# Patient Record
Sex: Male | Born: 1945 | Race: White | Hispanic: No | Smoking: Former smoker
Health system: Southern US, Community
[De-identification: ages and names within clinical notes are randomized; demographics above are authoritative.]

## PROBLEM LIST (undated history)

## (undated) DIAGNOSIS — I10 Essential (primary) hypertension: Secondary | ICD-10-CM

## (undated) DIAGNOSIS — H269 Unspecified cataract: Secondary | ICD-10-CM

## (undated) DIAGNOSIS — D369 Benign neoplasm, unspecified site: Secondary | ICD-10-CM

## (undated) DIAGNOSIS — E785 Hyperlipidemia, unspecified: Secondary | ICD-10-CM

## (undated) DIAGNOSIS — N390 Urinary tract infection, site not specified: Secondary | ICD-10-CM

## (undated) DIAGNOSIS — I219 Acute myocardial infarction, unspecified: Secondary | ICD-10-CM

## (undated) DIAGNOSIS — D689 Coagulation defect, unspecified: Secondary | ICD-10-CM

## (undated) DIAGNOSIS — M1990 Unspecified osteoarthritis, unspecified site: Secondary | ICD-10-CM

## (undated) DIAGNOSIS — I2510 Atherosclerotic heart disease of native coronary artery without angina pectoris: Secondary | ICD-10-CM

## (undated) DIAGNOSIS — K5130 Ulcerative (chronic) rectosigmoiditis without complications: Secondary | ICD-10-CM

## (undated) DIAGNOSIS — R001 Bradycardia, unspecified: Secondary | ICD-10-CM

## (undated) DIAGNOSIS — K219 Gastro-esophageal reflux disease without esophagitis: Secondary | ICD-10-CM

## (undated) DIAGNOSIS — B029 Zoster without complications: Secondary | ICD-10-CM

## (undated) DIAGNOSIS — I639 Cerebral infarction, unspecified: Secondary | ICD-10-CM

## (undated) DIAGNOSIS — G4730 Sleep apnea, unspecified: Secondary | ICD-10-CM

## (undated) DIAGNOSIS — M6530 Trigger finger, unspecified finger: Secondary | ICD-10-CM

## (undated) HISTORY — DX: Gastro-esophageal reflux disease without esophagitis: K21.9

## (undated) HISTORY — DX: Coagulation defect, unspecified: D68.9

## (undated) HISTORY — DX: Atherosclerotic heart disease of native coronary artery without angina pectoris: I25.10

## (undated) HISTORY — DX: Essential (primary) hypertension: I10

## (undated) HISTORY — DX: Cerebral infarction, unspecified: I63.9

## (undated) HISTORY — DX: Sleep apnea, unspecified: G47.30

## (undated) HISTORY — DX: Trigger finger, unspecified finger: M65.30

## (undated) HISTORY — DX: Bradycardia, unspecified: R00.1

## (undated) HISTORY — PX: TRIGGER FINGER RELEASE: SHX641

## (undated) HISTORY — DX: Benign neoplasm, unspecified site: D36.9

## (undated) HISTORY — DX: Hyperlipidemia, unspecified: E78.5

## (undated) HISTORY — DX: Zoster without complications: B02.9

## (undated) HISTORY — DX: Unspecified osteoarthritis, unspecified site: M19.90

## (undated) HISTORY — DX: Acute myocardial infarction, unspecified: I21.9

## (undated) HISTORY — DX: Unspecified cataract: H26.9

## (undated) HISTORY — DX: Ulcerative (chronic) rectosigmoiditis without complications: K51.30

## (undated) HISTORY — DX: Urinary tract infection, site not specified: N39.0

## (undated) HISTORY — PX: OTHER SURGICAL HISTORY: SHX169

---

## 1981-11-14 HISTORY — PX: LUMBAR FUSION: SHX111

## 1991-11-15 DIAGNOSIS — I219 Acute myocardial infarction, unspecified: Secondary | ICD-10-CM

## 1991-11-15 HISTORY — PX: ANGIOPLASTY: SHX39

## 1991-11-15 HISTORY — DX: Acute myocardial infarction, unspecified: I21.9

## 1992-08-27 ENCOUNTER — Encounter: Payer: Self-pay | Admitting: Gastroenterology

## 1997-08-14 ENCOUNTER — Encounter: Payer: Self-pay | Admitting: Gastroenterology

## 1997-08-18 ENCOUNTER — Encounter: Payer: Self-pay | Admitting: Gastroenterology

## 2000-11-14 HISTORY — PX: CHOLECYSTECTOMY: SHX55

## 2001-04-06 ENCOUNTER — Encounter: Payer: Self-pay | Admitting: Cardiovascular Disease

## 2001-04-06 ENCOUNTER — Encounter: Payer: Self-pay | Admitting: Internal Medicine

## 2001-04-06 ENCOUNTER — Inpatient Hospital Stay (HOSPITAL_COMMUNITY): Admission: EM | Admit: 2001-04-06 | Discharge: 2001-04-07 | Payer: Self-pay | Admitting: Cardiovascular Disease

## 2001-04-11 ENCOUNTER — Encounter (INDEPENDENT_AMBULATORY_CARE_PROVIDER_SITE_OTHER): Payer: Self-pay | Admitting: *Deleted

## 2001-04-11 ENCOUNTER — Ambulatory Visit (HOSPITAL_COMMUNITY): Admission: RE | Admit: 2001-04-11 | Discharge: 2001-04-12 | Payer: Self-pay | Admitting: *Deleted

## 2004-03-17 ENCOUNTER — Encounter: Payer: Self-pay | Admitting: Gastroenterology

## 2004-10-25 ENCOUNTER — Ambulatory Visit: Payer: Self-pay | Admitting: Internal Medicine

## 2004-11-01 ENCOUNTER — Ambulatory Visit: Payer: Self-pay | Admitting: Internal Medicine

## 2004-12-07 ENCOUNTER — Ambulatory Visit: Payer: Self-pay | Admitting: Internal Medicine

## 2004-12-21 ENCOUNTER — Ambulatory Visit: Payer: Self-pay | Admitting: Gastroenterology

## 2005-02-16 ENCOUNTER — Ambulatory Visit: Payer: Self-pay | Admitting: Internal Medicine

## 2005-03-08 ENCOUNTER — Ambulatory Visit: Payer: Self-pay | Admitting: Internal Medicine

## 2005-04-25 ENCOUNTER — Ambulatory Visit: Payer: Self-pay | Admitting: Gastroenterology

## 2005-05-13 ENCOUNTER — Ambulatory Visit: Payer: Self-pay | Admitting: Internal Medicine

## 2005-06-13 ENCOUNTER — Ambulatory Visit: Payer: Self-pay | Admitting: Gastroenterology

## 2005-07-20 ENCOUNTER — Ambulatory Visit: Payer: Self-pay | Admitting: Gastroenterology

## 2005-07-26 ENCOUNTER — Ambulatory Visit: Payer: Self-pay | Admitting: Internal Medicine

## 2005-08-24 ENCOUNTER — Ambulatory Visit: Payer: Self-pay | Admitting: Internal Medicine

## 2005-08-29 ENCOUNTER — Ambulatory Visit: Payer: Self-pay | Admitting: Gastroenterology

## 2005-10-07 ENCOUNTER — Ambulatory Visit: Payer: Self-pay | Admitting: Internal Medicine

## 2005-10-21 ENCOUNTER — Ambulatory Visit: Payer: Self-pay | Admitting: Internal Medicine

## 2005-10-24 ENCOUNTER — Ambulatory Visit: Payer: Self-pay | Admitting: Gastroenterology

## 2005-10-28 ENCOUNTER — Ambulatory Visit: Payer: Self-pay | Admitting: Internal Medicine

## 2005-11-28 ENCOUNTER — Ambulatory Visit: Payer: Self-pay | Admitting: Internal Medicine

## 2005-12-29 ENCOUNTER — Ambulatory Visit: Payer: Self-pay | Admitting: Internal Medicine

## 2006-01-07 ENCOUNTER — Emergency Department (HOSPITAL_COMMUNITY): Admission: EM | Admit: 2006-01-07 | Discharge: 2006-01-07 | Payer: Self-pay | Admitting: Family Medicine

## 2006-03-01 ENCOUNTER — Ambulatory Visit: Payer: Self-pay | Admitting: Internal Medicine

## 2006-03-15 ENCOUNTER — Ambulatory Visit: Payer: Self-pay | Admitting: Internal Medicine

## 2006-04-17 ENCOUNTER — Ambulatory Visit: Payer: Self-pay | Admitting: Gastroenterology

## 2006-06-09 ENCOUNTER — Ambulatory Visit: Payer: Self-pay | Admitting: Internal Medicine

## 2006-06-14 ENCOUNTER — Ambulatory Visit: Payer: Self-pay | Admitting: Gastroenterology

## 2006-06-15 ENCOUNTER — Ambulatory Visit: Payer: Self-pay | Admitting: Internal Medicine

## 2006-08-22 ENCOUNTER — Ambulatory Visit: Payer: Self-pay | Admitting: Gastroenterology

## 2006-09-04 ENCOUNTER — Ambulatory Visit: Payer: Self-pay | Admitting: Internal Medicine

## 2006-10-03 ENCOUNTER — Ambulatory Visit: Payer: Self-pay | Admitting: Gastroenterology

## 2006-10-20 ENCOUNTER — Ambulatory Visit: Payer: Self-pay | Admitting: Internal Medicine

## 2006-10-20 LAB — CONVERTED CEMR LAB
ALT: 26 units/L (ref 0–40)
Basophils Relative: 0.8 % (ref 0.0–1.0)
Calcium: 8.7 mg/dL (ref 8.4–10.5)
Chloride: 105 meq/L (ref 96–112)
Cholesterol: 143 mg/dL (ref 0–200)
Creatinine, Ser: 1.1 mg/dL (ref 0.4–1.5)
Eosinophil percent: 1 % (ref 0.0–5.0)
Glucose, Bld: 77 mg/dL (ref 70–99)
HCT: 43.2 % (ref 39.0–52.0)
HDL: 45.7 mg/dL (ref >39.0)
LDL Cholesterol: 80 mg/dL (ref 0–99)
Lymphocytes Relative: 40.1 % (ref 12.0–46.0)
MCV: 85.4 fL (ref 78.0–100.0)
Monocytes Absolute: 1 10*3/uL — ABNORMAL HIGH (ref 0.2–0.7)
Platelets: 292 10*3/uL (ref 150–400)
TSH: 0.79 microintl units/mL (ref 0.35–5.50)
Triglyceride fasting, serum: 88 mg/dL (ref 0–149)
WBC: 10.6 10*3/uL — ABNORMAL HIGH (ref 4.5–10.5)

## 2006-10-27 ENCOUNTER — Ambulatory Visit: Payer: Self-pay | Admitting: Internal Medicine

## 2006-11-13 ENCOUNTER — Ambulatory Visit: Payer: Self-pay | Admitting: Gastroenterology

## 2006-11-13 LAB — CONVERTED CEMR LAB
Albumin: 3.6 g/dL (ref 3.5–5.2)
BUN: 8 mg/dL (ref 6–23)
Basophils Absolute: 0 10*3/uL (ref 0.0–0.1)
CO2: 32 meq/L (ref 19–32)
Creatinine, Ser: 1.1 mg/dL (ref 0.4–1.5)
GFR calc non Af Amer: 73 mL/min
HCT: 43.7 % (ref 39.0–52.0)
Hemoglobin: 14.8 g/dL (ref 13.0–17.0)
MCHC: 33.9 g/dL (ref 30.0–36.0)
MCV: 85.3 fL (ref 78.0–100.0)
Monocytes Absolute: 0.6 10*3/uL (ref 0.2–0.7)
Neutrophils Relative %: 48.9 % (ref 43.0–77.0)
Sodium: 144 meq/L (ref 135–145)
Total Bilirubin: 0.7 mg/dL (ref 0.3–1.2)

## 2006-11-14 DIAGNOSIS — K5130 Ulcerative (chronic) rectosigmoiditis without complications: Secondary | ICD-10-CM

## 2006-11-14 HISTORY — DX: Ulcerative (chronic) rectosigmoiditis without complications: K51.30

## 2006-12-21 ENCOUNTER — Ambulatory Visit: Payer: Self-pay | Admitting: Gastroenterology

## 2006-12-21 LAB — CONVERTED CEMR LAB
AST: 29 units/L (ref 0–37)
Amylase: 36 units/L (ref 27–131)
Basophils Relative: 0.4 % (ref 0.0–1.0)
Bilirubin, Direct: 0.2 mg/dL (ref 0.0–0.3)
CO2: 31 meq/L (ref 19–32)
Chloride: 107 meq/L (ref 96–112)
Creatinine, Ser: 1.1 mg/dL (ref 0.4–1.5)
Eosinophils Absolute: 0.3 10*3/uL (ref 0.0–0.6)
Eosinophils Relative: 3.7 % (ref 0.0–5.0)
GFR calc non Af Amer: 73 mL/min
Glucose, Bld: 105 mg/dL — ABNORMAL HIGH (ref 70–99)
HCT: 42.1 % (ref 39.0–52.0)
MCV: 85.6 fL (ref 78.0–100.0)
Neutrophils Relative %: 52.3 % (ref 43.0–77.0)
RBC: 4.93 M/uL (ref 4.22–5.81)
Sodium: 143 meq/L (ref 135–145)
Total Bilirubin: 0.8 mg/dL (ref 0.3–1.2)
Total Protein: 7 g/dL (ref 6.0–8.3)
WBC: 7.1 10*3/uL (ref 4.5–10.5)

## 2006-12-22 ENCOUNTER — Ambulatory Visit: Payer: Self-pay | Admitting: Internal Medicine

## 2007-01-09 ENCOUNTER — Ambulatory Visit: Payer: Self-pay | Admitting: Gastroenterology

## 2007-01-29 ENCOUNTER — Ambulatory Visit: Payer: Self-pay | Admitting: Internal Medicine

## 2007-04-03 ENCOUNTER — Ambulatory Visit (HOSPITAL_COMMUNITY): Admission: RE | Admit: 2007-04-03 | Discharge: 2007-04-03 | Payer: Self-pay | Admitting: Orthopedic Surgery

## 2007-04-24 ENCOUNTER — Ambulatory Visit: Payer: Self-pay | Admitting: Internal Medicine

## 2007-06-04 ENCOUNTER — Telehealth (INDEPENDENT_AMBULATORY_CARE_PROVIDER_SITE_OTHER): Payer: Self-pay | Admitting: *Deleted

## 2007-06-04 ENCOUNTER — Telehealth: Payer: Self-pay | Admitting: Internal Medicine

## 2007-06-07 DIAGNOSIS — J0100 Acute maxillary sinusitis, unspecified: Secondary | ICD-10-CM | POA: Insufficient documentation

## 2007-06-14 DIAGNOSIS — I252 Old myocardial infarction: Secondary | ICD-10-CM

## 2007-06-14 DIAGNOSIS — K5130 Ulcerative (chronic) rectosigmoiditis without complications: Secondary | ICD-10-CM

## 2007-06-14 DIAGNOSIS — E785 Hyperlipidemia, unspecified: Secondary | ICD-10-CM | POA: Insufficient documentation

## 2007-06-14 DIAGNOSIS — Z86010 Personal history of colonic polyps: Secondary | ICD-10-CM | POA: Insufficient documentation

## 2007-06-14 DIAGNOSIS — I10 Essential (primary) hypertension: Secondary | ICD-10-CM

## 2007-06-15 ENCOUNTER — Ambulatory Visit: Payer: Self-pay | Admitting: Internal Medicine

## 2007-06-15 DIAGNOSIS — I2510 Atherosclerotic heart disease of native coronary artery without angina pectoris: Secondary | ICD-10-CM | POA: Insufficient documentation

## 2007-07-24 ENCOUNTER — Ambulatory Visit: Payer: Self-pay | Admitting: Internal Medicine

## 2007-08-07 ENCOUNTER — Ambulatory Visit: Payer: Self-pay | Admitting: Gastroenterology

## 2007-08-07 LAB — CONVERTED CEMR LAB
ALT: 41 units/L (ref 0–53)
AST: 31 units/L (ref 0–37)
Albumin: 3.8 g/dL (ref 3.5–5.2)
Basophils Absolute: 0.1 10*3/uL (ref 0.0–0.1)
Eosinophils Absolute: 0.2 10*3/uL (ref 0.0–0.6)
GFR calc Af Amer: 72 mL/min
Glucose, Bld: 86 mg/dL (ref 70–99)
MCHC: 35.1 g/dL (ref 30.0–36.0)
Monocytes Relative: 11.5 % — ABNORMAL HIGH (ref 3.0–11.0)
Platelets: 234 10*3/uL (ref 150–400)
Potassium: 4.1 meq/L (ref 3.5–5.1)
RBC: 4.59 M/uL (ref 4.22–5.81)
RDW: 14.4 % (ref 11.5–14.6)
Sodium: 139 meq/L (ref 135–145)
Total Bilirubin: 0.6 mg/dL (ref 0.3–1.2)

## 2007-09-11 ENCOUNTER — Encounter: Payer: Self-pay | Admitting: Gastroenterology

## 2007-09-11 ENCOUNTER — Encounter: Payer: Self-pay | Admitting: Internal Medicine

## 2007-09-11 ENCOUNTER — Ambulatory Visit: Payer: Self-pay | Admitting: Gastroenterology

## 2007-10-23 ENCOUNTER — Ambulatory Visit: Payer: Self-pay | Admitting: Internal Medicine

## 2007-10-23 DIAGNOSIS — M6530 Trigger finger, unspecified finger: Secondary | ICD-10-CM

## 2007-10-23 LAB — CONVERTED CEMR LAB
ALT: 33 units/L (ref 0–53)
AST: 25 units/L (ref 0–37)
Albumin: 3.7 g/dL (ref 3.5–5.2)
Alkaline Phosphatase: 66 units/L (ref 39–117)
BUN: 9 mg/dL (ref 6–23)
Basophils Absolute: 0 10*3/uL (ref 0.0–0.1)
Basophils Relative: 0.4 % (ref 0.0–1.0)
CO2: 30 meq/L (ref 19–32)
Calcium: 9.3 mg/dL (ref 8.4–10.5)
Chloride: 102 meq/L (ref 96–112)
Creatinine, Ser: 1.1 mg/dL (ref 0.4–1.5)
HDL: 18 mg/dL — ABNORMAL LOW (ref >39.0)
Hemoglobin: 14.4 g/dL (ref 13.0–17.0)
LDL Cholesterol: 74 mg/dL (ref 0–99)
MCHC: 35.5 g/dL (ref 30.0–36.0)
Monocytes Absolute: 0.5 10*3/uL (ref 0.2–0.7)
Monocytes Relative: 7.9 % (ref 3.0–11.0)
Potassium: 4 meq/L (ref 3.5–5.1)
RBC: 4.62 M/uL (ref 4.22–5.81)
RDW: 15.1 % — ABNORMAL HIGH (ref 11.5–14.6)
TSH: 0.88 microintl units/mL (ref 0.35–5.50)
Total Bilirubin: 1.1 mg/dL (ref 0.3–1.2)
Total CHOL/HDL Ratio: 6.5
Total Protein: 6.9 g/dL (ref 6.0–8.3)
VLDL: 25 mg/dL (ref 0–40)

## 2007-11-06 ENCOUNTER — Telehealth: Payer: Self-pay | Admitting: Internal Medicine

## 2007-11-07 DIAGNOSIS — K219 Gastro-esophageal reflux disease without esophagitis: Secondary | ICD-10-CM

## 2007-11-15 HISTORY — PX: ROTATOR CUFF REPAIR: SHX139

## 2007-11-26 ENCOUNTER — Ambulatory Visit: Payer: Self-pay | Admitting: Internal Medicine

## 2007-11-26 DIAGNOSIS — J209 Acute bronchitis, unspecified: Secondary | ICD-10-CM | POA: Insufficient documentation

## 2008-01-02 ENCOUNTER — Ambulatory Visit: Payer: Self-pay | Admitting: Gastroenterology

## 2008-01-02 LAB — CONVERTED CEMR LAB
Albumin: 3.6 g/dL (ref 3.5–5.2)
Alkaline Phosphatase: 71 units/L (ref 39–117)
BUN: 12 mg/dL (ref 6–23)
Basophils Relative: 0 % (ref 0.0–1.0)
CO2: 32 meq/L (ref 19–32)
Creatinine, Ser: 1.1 mg/dL (ref 0.4–1.5)
Eosinophils Absolute: 0.3 10*3/uL (ref 0.0–0.6)
GFR calc non Af Amer: 73 mL/min
Glucose, Bld: 100 mg/dL — ABNORMAL HIGH (ref 70–99)
Hemoglobin: 14 g/dL (ref 13.0–17.0)
Lipase: 27 units/L (ref 11.0–59.0)
Lymphocytes Relative: 27.4 % (ref 12.0–46.0)
MCHC: 33.2 g/dL (ref 30.0–36.0)
MCV: 87.9 fL (ref 78.0–100.0)
Monocytes Absolute: 0.7 10*3/uL (ref 0.2–0.7)
Monocytes Relative: 8.8 % (ref 3.0–11.0)
Neutro Abs: 4.7 10*3/uL (ref 1.4–7.7)
Platelets: 261 10*3/uL (ref 150–400)
Total Bilirubin: 0.6 mg/dL (ref 0.3–1.2)

## 2008-01-11 ENCOUNTER — Ambulatory Visit: Payer: Self-pay | Admitting: Internal Medicine

## 2008-01-11 DIAGNOSIS — M545 Low back pain: Secondary | ICD-10-CM

## 2008-02-21 ENCOUNTER — Ambulatory Visit: Payer: Self-pay | Admitting: Internal Medicine

## 2008-02-21 DIAGNOSIS — R972 Elevated prostate specific antigen [PSA]: Secondary | ICD-10-CM | POA: Insufficient documentation

## 2008-02-21 LAB — CONVERTED CEMR LAB
PSA, Free Pct: 34 (ref >25)
PSA, Free: 0.2 ng/mL

## 2008-03-05 ENCOUNTER — Encounter: Payer: Self-pay | Admitting: Internal Medicine

## 2008-03-05 ENCOUNTER — Ambulatory Visit: Payer: Self-pay

## 2008-03-10 ENCOUNTER — Ambulatory Visit: Payer: Self-pay | Admitting: Internal Medicine

## 2008-03-28 ENCOUNTER — Telehealth: Payer: Self-pay | Admitting: Internal Medicine

## 2008-04-24 ENCOUNTER — Ambulatory Visit: Payer: Self-pay | Admitting: Internal Medicine

## 2008-05-13 ENCOUNTER — Telehealth: Payer: Self-pay | Admitting: Internal Medicine

## 2008-05-14 ENCOUNTER — Ambulatory Visit: Payer: Self-pay | Admitting: Internal Medicine

## 2008-05-14 DIAGNOSIS — T6391XA Toxic effect of contact with unspecified venomous animal, accidental (unintentional), initial encounter: Secondary | ICD-10-CM | POA: Insufficient documentation

## 2008-07-02 DIAGNOSIS — G4730 Sleep apnea, unspecified: Secondary | ICD-10-CM

## 2008-07-03 ENCOUNTER — Ambulatory Visit: Payer: Self-pay | Admitting: Gastroenterology

## 2008-07-03 DIAGNOSIS — K5150 Left sided colitis without complications: Secondary | ICD-10-CM

## 2008-07-03 LAB — CONVERTED CEMR LAB
Albumin: 3.5 g/dL (ref 3.5–5.2)
CO2: 32 meq/L (ref 19–32)
Calcium: 9.3 mg/dL (ref 8.4–10.5)
Chloride: 101 meq/L (ref 96–112)
Eosinophils Relative: 3.7 % (ref 0.0–5.0)
GFR calc Af Amer: 79 mL/min
GFR calc non Af Amer: 65 mL/min
Glucose, Bld: 103 mg/dL — ABNORMAL HIGH (ref 70–99)
Hemoglobin: 14 g/dL (ref 13.0–17.0)
Lymphocytes Relative: 19 % (ref 12.0–46.0)
Monocytes Relative: 12.3 % — ABNORMAL HIGH (ref 3.0–12.0)
Platelets: 225 10*3/uL (ref 150–400)
Potassium: 3.4 meq/L — ABNORMAL LOW (ref 3.5–5.1)
RDW: 15.3 % — ABNORMAL HIGH (ref 11.5–14.6)
Sodium: 139 meq/L (ref 135–145)
Total Protein: 7.2 g/dL (ref 6.0–8.3)
WBC: 6.1 10*3/uL (ref 4.5–10.5)

## 2008-10-24 ENCOUNTER — Ambulatory Visit: Payer: Self-pay | Admitting: Internal Medicine

## 2008-10-24 LAB — CONVERTED CEMR LAB
ALT: 28 units/L (ref 0–53)
Alkaline Phosphatase: 61 units/L (ref 39–117)
Bilirubin, Direct: 0.1 mg/dL (ref 0.0–0.3)
CO2: 32 meq/L (ref 19–32)
Calcium: 9.5 mg/dL (ref 8.4–10.5)
Glucose, Bld: 94 mg/dL (ref 70–99)
Hemoglobin: 14.7 g/dL (ref 13.0–17.0)
Ketones, urine, test strip: NEGATIVE
LDL Cholesterol: 67 mg/dL (ref 0–99)
Lymphocytes Relative: 24 % (ref 12.0–46.0)
Monocytes Relative: 10.4 % (ref 3.0–12.0)
Neutro Abs: 3 10*3/uL (ref 1.4–7.7)
Nitrite: NEGATIVE
Potassium: 3.9 meq/L (ref 3.5–5.1)
RDW: 15.4 % — ABNORMAL HIGH (ref 11.5–14.6)
Sodium: 141 meq/L (ref 135–145)
Total CHOL/HDL Ratio: 4.3
Total Protein: 7.2 g/dL (ref 6.0–8.3)
Triglycerides: 90 mg/dL (ref 0–149)
Urobilinogen, UA: 0.2
WBC Urine, dipstick: NEGATIVE

## 2008-11-05 ENCOUNTER — Ambulatory Visit: Payer: Self-pay | Admitting: Internal Medicine

## 2008-12-15 ENCOUNTER — Ambulatory Visit: Payer: Self-pay | Admitting: Gastroenterology

## 2008-12-15 LAB — CONVERTED CEMR LAB
AST: 26 units/L (ref 0–37)
Albumin: 3.7 g/dL (ref 3.5–5.2)
Alkaline Phosphatase: 72 units/L (ref 39–117)
BUN: 11 mg/dL (ref 6–23)
Eosinophils Absolute: 0.2 10*3/uL (ref 0.0–0.7)
HCT: 45.2 % (ref 39.0–52.0)
Lipase: 38 units/L (ref 11.0–59.0)
MCV: 89.9 fL (ref 78.0–100.0)
Monocytes Absolute: 0.8 10*3/uL (ref 0.1–1.0)
Platelets: 228 10*3/uL (ref 150–400)
Potassium: 4.2 meq/L (ref 3.5–5.1)
RDW: 15.4 % — ABNORMAL HIGH (ref 11.5–14.6)
Sodium: 138 meq/L (ref 135–145)
Total Protein: 7.4 g/dL (ref 6.0–8.3)

## 2009-02-20 ENCOUNTER — Ambulatory Visit: Payer: Self-pay | Admitting: Internal Medicine

## 2009-02-20 ENCOUNTER — Encounter: Payer: Self-pay | Admitting: Internal Medicine

## 2009-02-20 DIAGNOSIS — M79609 Pain in unspecified limb: Secondary | ICD-10-CM | POA: Insufficient documentation

## 2009-03-17 ENCOUNTER — Telehealth: Payer: Self-pay | Admitting: Internal Medicine

## 2009-03-20 ENCOUNTER — Telehealth: Payer: Self-pay | Admitting: Internal Medicine

## 2009-06-15 ENCOUNTER — Telehealth: Payer: Self-pay | Admitting: Internal Medicine

## 2009-06-24 ENCOUNTER — Ambulatory Visit: Payer: Self-pay | Admitting: Gastroenterology

## 2009-06-24 LAB — CONVERTED CEMR LAB
ALT: 35 units/L (ref 0–53)
Albumin: 3.7 g/dL (ref 3.5–5.2)
Basophils Absolute: 0 10*3/uL (ref 0.0–0.1)
CO2: 33 meq/L — ABNORMAL HIGH (ref 19–32)
Calcium: 9.4 mg/dL (ref 8.4–10.5)
Chloride: 104 meq/L (ref 96–112)
Creatinine, Ser: 1.2 mg/dL (ref 0.4–1.5)
Eosinophils Absolute: 0.2 10*3/uL (ref 0.0–0.7)
GFR calc non Af Amer: 65.18 mL/min (ref >60)
Hemoglobin: 14.5 g/dL (ref 13.0–17.0)
Lipase: 140 units/L — ABNORMAL HIGH (ref 11.0–59.0)
Lymphocytes Relative: 23.7 % (ref 12.0–46.0)
Monocytes Relative: 11.7 % (ref 3.0–12.0)
Neutro Abs: 3.6 10*3/uL (ref 1.4–7.7)
Neutrophils Relative %: 60.7 % (ref 43.0–77.0)
Platelets: 204 10*3/uL (ref 150.0–400.0)
Potassium: 3.9 meq/L (ref 3.5–5.1)
RDW: 14.4 % (ref 11.5–14.6)

## 2009-06-25 ENCOUNTER — Ambulatory Visit: Payer: Self-pay | Admitting: Gastroenterology

## 2009-06-25 LAB — CONVERTED CEMR LAB
Amylase: 56 units/L (ref 27–131)
Lipase: 20 units/L (ref 11.0–59.0)

## 2009-07-24 ENCOUNTER — Ambulatory Visit: Payer: Self-pay | Admitting: Internal Medicine

## 2009-07-24 DIAGNOSIS — J069 Acute upper respiratory infection, unspecified: Secondary | ICD-10-CM | POA: Insufficient documentation

## 2009-10-12 ENCOUNTER — Telehealth: Payer: Self-pay | Admitting: Internal Medicine

## 2009-10-30 ENCOUNTER — Ambulatory Visit: Payer: Self-pay | Admitting: Internal Medicine

## 2009-10-30 LAB — CONVERTED CEMR LAB
AST: 26 units/L (ref 0–37)
Albumin: 3.7 g/dL (ref 3.5–5.2)
Alkaline Phosphatase: 64 units/L (ref 39–117)
Basophils Absolute: 0 10*3/uL (ref 0.0–0.1)
Basophils Relative: 0.7 % (ref 0.0–3.0)
CO2: 30 meq/L (ref 19–32)
Calcium: 9.3 mg/dL (ref 8.4–10.5)
GFR calc non Af Amer: 65.1 mL/min (ref >60)
Glucose, Bld: 97 mg/dL (ref 70–99)
Glucose, Urine, Semiquant: NEGATIVE
HCT: 43.5 % (ref 39.0–52.0)
Hemoglobin: 14.3 g/dL (ref 13.0–17.0)
Lymphocytes Relative: 20.8 % (ref 12.0–46.0)
Lymphs Abs: 1.2 10*3/uL (ref 0.7–4.0)
MCHC: 32.8 g/dL (ref 30.0–36.0)
Monocytes Relative: 10.5 % (ref 3.0–12.0)
Neutro Abs: 4 10*3/uL (ref 1.4–7.7)
Nitrite: NEGATIVE
Potassium: 3.9 meq/L (ref 3.5–5.1)
RBC: 4.72 M/uL (ref 4.22–5.81)
RDW: 14.8 % — ABNORMAL HIGH (ref 11.5–14.6)
Sodium: 139 meq/L (ref 135–145)
Specific Gravity, Urine: 1.015
TSH: 0.81 microintl units/mL (ref 0.35–5.50)
Total CHOL/HDL Ratio: 4
Total Protein: 7.2 g/dL (ref 6.0–8.3)
WBC Urine, dipstick: NEGATIVE
pH: 6.5

## 2009-11-11 ENCOUNTER — Ambulatory Visit: Payer: Self-pay | Admitting: Internal Medicine

## 2009-12-22 ENCOUNTER — Telehealth: Payer: Self-pay | Admitting: Gastroenterology

## 2010-01-18 ENCOUNTER — Ambulatory Visit: Payer: Self-pay | Admitting: Gastroenterology

## 2010-01-19 LAB — CONVERTED CEMR LAB
Albumin: 3.7 g/dL (ref 3.5–5.2)
BUN: 9 mg/dL (ref 6–23)
Basophils Relative: 0.5 % (ref 0.0–3.0)
Calcium: 9 mg/dL (ref 8.4–10.5)
Creatinine, Ser: 1.2 mg/dL (ref 0.4–1.5)
Eosinophils Relative: 3.2 % (ref 0.0–5.0)
GFR calc non Af Amer: 65.06 mL/min (ref >60)
Glucose, Bld: 87 mg/dL (ref 70–99)
HCT: 42.3 % (ref 39.0–52.0)
Hemoglobin: 14.1 g/dL (ref 13.0–17.0)
Lipase: 17 units/L (ref 11.0–59.0)
Lymphs Abs: 1.6 10*3/uL (ref 0.7–4.0)
MCV: 89.8 fL (ref 78.0–100.0)
Monocytes Absolute: 0.7 10*3/uL (ref 0.1–1.0)
Monocytes Relative: 10.9 % (ref 3.0–12.0)
Neutro Abs: 4.1 10*3/uL (ref 1.4–7.7)
Platelets: 208 10*3/uL (ref 150.0–400.0)
RBC: 4.71 M/uL (ref 4.22–5.81)
Total Bilirubin: 0.7 mg/dL (ref 0.3–1.2)
WBC: 6.6 10*3/uL (ref 4.5–10.5)

## 2010-01-27 ENCOUNTER — Ambulatory Visit: Payer: Self-pay | Admitting: Internal Medicine

## 2010-01-27 DIAGNOSIS — K2900 Acute gastritis without bleeding: Secondary | ICD-10-CM | POA: Insufficient documentation

## 2010-01-27 LAB — CONVERTED CEMR LAB: H Pylori IgG: NEGATIVE

## 2010-02-03 ENCOUNTER — Ambulatory Visit: Payer: Self-pay | Admitting: Cardiology

## 2010-02-03 ENCOUNTER — Telehealth: Payer: Self-pay | Admitting: Gastroenterology

## 2010-02-03 ENCOUNTER — Telehealth (INDEPENDENT_AMBULATORY_CARE_PROVIDER_SITE_OTHER): Payer: Self-pay | Admitting: *Deleted

## 2010-02-03 ENCOUNTER — Telehealth: Payer: Self-pay | Admitting: Internal Medicine

## 2010-02-03 ENCOUNTER — Ambulatory Visit: Payer: Self-pay | Admitting: Internal Medicine

## 2010-02-03 DIAGNOSIS — R079 Chest pain, unspecified: Secondary | ICD-10-CM

## 2010-02-04 ENCOUNTER — Encounter (HOSPITAL_COMMUNITY): Admission: RE | Admit: 2010-02-04 | Discharge: 2010-04-14 | Payer: Self-pay | Admitting: Cardiology

## 2010-02-04 ENCOUNTER — Ambulatory Visit: Payer: Self-pay

## 2010-02-04 ENCOUNTER — Ambulatory Visit: Payer: Self-pay | Admitting: Internal Medicine

## 2010-03-10 ENCOUNTER — Ambulatory Visit: Payer: Self-pay | Admitting: Internal Medicine

## 2010-03-15 ENCOUNTER — Telehealth: Payer: Self-pay | Admitting: Internal Medicine

## 2010-05-12 ENCOUNTER — Ambulatory Visit: Payer: Self-pay | Admitting: Internal Medicine

## 2010-05-12 DIAGNOSIS — A070 Balantidiasis: Secondary | ICD-10-CM

## 2010-07-12 ENCOUNTER — Encounter (INDEPENDENT_AMBULATORY_CARE_PROVIDER_SITE_OTHER): Payer: Self-pay | Admitting: *Deleted

## 2010-07-27 ENCOUNTER — Telehealth: Payer: Self-pay | Admitting: Gastroenterology

## 2010-08-13 ENCOUNTER — Ambulatory Visit: Payer: Self-pay | Admitting: Internal Medicine

## 2010-08-13 DIAGNOSIS — L909 Atrophic disorder of skin, unspecified: Secondary | ICD-10-CM | POA: Insufficient documentation

## 2010-08-13 DIAGNOSIS — L919 Hypertrophic disorder of the skin, unspecified: Secondary | ICD-10-CM

## 2010-08-23 ENCOUNTER — Ambulatory Visit: Payer: Self-pay | Admitting: Gastroenterology

## 2010-08-23 LAB — CONVERTED CEMR LAB
ALT: 34 units/L (ref 0–53)
AST: 25 units/L (ref 0–37)
BUN: 12 mg/dL (ref 6–23)
CO2: 34 meq/L — ABNORMAL HIGH (ref 19–32)
Chloride: 102 meq/L (ref 96–112)
Eosinophils Absolute: 0 10*3/uL (ref 0.0–0.7)
Lipase: 28 units/L (ref 11.0–59.0)
Lymphocytes Relative: 19.2 % (ref 12.0–46.0)
MCHC: 35.1 g/dL (ref 30.0–36.0)
MCV: 88.4 fL (ref 78.0–100.0)
Monocytes Absolute: 0.8 10*3/uL (ref 0.1–1.0)
Neutrophils Relative %: 67.5 % (ref 43.0–77.0)
Platelets: 213 10*3/uL (ref 150.0–400.0)
Potassium: 4.4 meq/L (ref 3.5–5.1)
TSH: 1.04 microintl units/mL (ref 0.35–5.50)
Total Bilirubin: 0.6 mg/dL (ref 0.3–1.2)
Total Protein: 7.5 g/dL (ref 6.0–8.3)
WBC: 6.8 10*3/uL (ref 4.5–10.5)

## 2010-09-21 ENCOUNTER — Ambulatory Visit: Payer: Self-pay | Admitting: Internal Medicine

## 2010-09-21 DIAGNOSIS — S91309A Unspecified open wound, unspecified foot, initial encounter: Secondary | ICD-10-CM | POA: Insufficient documentation

## 2010-11-09 ENCOUNTER — Ambulatory Visit: Payer: Self-pay | Admitting: Internal Medicine

## 2010-11-09 LAB — CONVERTED CEMR LAB
AST: 23 units/L (ref 0–37)
Albumin: 3.6 g/dL (ref 3.5–5.2)
Basophils Absolute: 0 10*3/uL (ref 0.0–0.1)
CO2: 30 meq/L (ref 19–32)
Chloride: 101 meq/L (ref 96–112)
Eosinophils Absolute: 0 10*3/uL (ref 0.0–0.7)
Glucose, Bld: 93 mg/dL (ref 70–99)
HCT: 43.4 % (ref 39.0–52.0)
Hemoglobin: 14.8 g/dL (ref 13.0–17.0)
Lymphs Abs: 1.3 10*3/uL (ref 0.7–4.0)
MCHC: 34.1 g/dL (ref 30.0–36.0)
MCV: 89.8 fL (ref 78.0–100.0)
Monocytes Relative: 11.5 % (ref 3.0–12.0)
Neutro Abs: 4 10*3/uL (ref 1.4–7.7)
Potassium: 4 meq/L (ref 3.5–5.1)
RDW: 15.8 % — ABNORMAL HIGH (ref 11.5–14.6)
Sodium: 140 meq/L (ref 135–145)
TSH: 0.86 microintl units/mL (ref 0.35–5.50)

## 2010-11-17 ENCOUNTER — Ambulatory Visit
Admission: RE | Admit: 2010-11-17 | Discharge: 2010-11-17 | Payer: Self-pay | Source: Home / Self Care | Attending: Internal Medicine | Admitting: Internal Medicine

## 2010-12-14 NOTE — Progress Notes (Signed)
Summary: Med Refill   Phone Note Call from Patient Call back at Home Phone [phone]   Caller: Patient Call For: Dr Fuller Plan Reason for Call: Refill Medication Details for Reason: Med Refill Summary of Call: Pt waited to make appt, now only has 2 days left of medication, but does not know what the medication is or what it is for, states it the only medication he takes. Pt has appt for October, needs 30 day refill called in to [facility] in [place]. Initial call taken by: Cora Daniels,  July 27, 2010 10:45 AM  Follow-up for Phone Call        rx sent, patients wife aware she is to let her husband know that he must keep his appt. Follow-up by: Bernita Buffy CMA Deborra Medina),  July 27, 2010 11:09 AM

## 2010-12-14 NOTE — Op Note (Signed)
Summary: Laparoscopic Cholecystectomy                    [place]  Patient:    [name], [name]                       MRN: [id number] Proc. Date: 04/11/01 Adm. Date:  [id number] Attending:  Darrelyn Hillock.                           Operative Report  PREOPERATIVE DIAGNOSIS:  Acute cholecystitis and elevated liver function test.  POSTOPERATIVE DIAGNOSIS:  Acute cholecystitis and elevated liver function test with normal cholangiogram.  OPERATION:  Laparoscopic cholecystectomy with intraoperative cholangiogram.  SURGEON:  Darrelyn Hillock, M.D.  ANESTHESIA:  General.  DESCRIPTION OF PROCEDURE:  The patient was taken to the operating room, placed in supine position, after adequate anesthesia was induced using endotracheal tube.  The abdomen was prepped and draped in the normal sterile fashion.  Using a transverse infraumbilical incision, I dissected down to the fascia. The fascia was opened vertically.  An 0 Vicryl purse-string suture was placed around the fascial defect.  Hasson trocar was placed in the abdomen and the abdomen was insufflated with carbon dioxide.  Under direct visualization, a 10 mm port was placed in the subxiphoid region and two 5 mm ports were placed in the right abdomen.  Gallbladder was identified and retracted cephalad.  There were a great number of adhesions, both of the omentum and duodenum to the wall of the gallbladder.  These were tediously taken down.  Dissection then continued from the neck of the gallbladder on to a small 1 to 1.5 mm cystic duct.  A clip was placed proximally on this and a ductotomy was made.  A 14 gauge angiocatheter was introduced in the right upper quadrant and a Reddick catheter was passed distally through the cystic duct.  Cholangiogram was performed which showed good filling of the common bile duct, emptying of the duodenum, and both right and left hepatic ducts were visualized.  There were no  filling defects and no abnormalities noted.  The catheter was removed.  The cystic duct was then triply clipped and divided.  The cystic artery was dissected free, triply clipped, and divided. The gallbladder was taken off the gallbladder bed using Bovie electrocautery, placed in an endocatch bag, and removed through the umbilical port.  Adequate hemostasis was insured.  The right upper quadrant was copiously irrigated. All incisions were injected using Marcaine.  The abdomen was allowed to deflate.  The fascial defect was closed with an 0 Vicryl purse-string suture.  Skin incisions were closed with subcuticular 4-0 Monocryl.  Steri-Strips and sterile dressings were applied.  The patient tolerated the procedure well and went to PACU in good condition. DD:  04/11/01 TD:  04/11/01 Job: [id number]         SP Surgical Pathology - STATUS: Final             By: Randon Goldsmith MD , Yolanda Bonine          Perform Date: [id number] 09:21  Ordered By: Melvia Heaps,         Ordered Date:  Facility: [facility]                              Department: [facility]  Service Report  Text  [facility]   [place], [place] [address]   [phone]    REPORT OF SURGICAL PATHOLOGY    Case #: [id number]   Patient Name: [name], [name]   PID: [id number]   Pathologist: H. Concha Norway, M.D.   DOB/Age 03-05-1946 (Age: 65) Gender: M   Date Taken: 04/11/2001   Date Received: 04/11/2001    FINAL DIAGNOSIS    ***MICROSCOPIC EXAMINATION AND DIAGNOSIS***    GALLBLADDER, CHOLECYSTECTOMY: CHOLESTEROLOSIS AND CHRONIC   CHOLECYSTITIS.    Joretta Bachelor BAIRD MD    COMMENT   Present is prominent cholesterolosis with cholesterol polyp   formation in the mucosa. Also noted within the wall is some   patchy chronic inflammation. (HWB:jn, 04/12/01)    jn   Date Reported: 04/12/2001 H. Concha Norway, M.D.   *** Electronically Signed Out By [facility] ***    Clinical information    CHOLELITHIASIS (WF)    specimen(s) obtained   GALLBLADDER    Gross Description   Size/?Intact: 9 x 4 x 1.5 cm previously opened gallbladder   Serosal surface: Smooth and hyperemic   Mucosa/Wall: The mucosa is glistening tan-red and there are soft   yellow papillary excrescences present measuring up to 0.5 cm. The   wall measures up to 0.3 cm in thickness.   Contents: The gallbladder contains bile. There are no calculi   present in the gallbladder or within the specimen container.   Cystic duct: 0.4 cm in length and patent   Block Summary: One block submitted. (CL:gt) 04/11/01    gdt/

## 2010-12-14 NOTE — Assessment & Plan Note (Signed)
Summary: 6 MONTHS F.U..EM   History of Present Illness Visit Type: Follow-up Visit Primary GI MD: Joylene Igo MD [facility] Primary Provider: Benay Pillow, MD Requesting Provider: n/a Chief Complaint: F/U 6 months GERD & colitis: patient has not had any problems recently with either History of Present Illness:   [name] returns for follow up with no ongoing complaints. He has had excellent control his ulcerative colitis since beginning 6-mercaptopurine. He has occasionally active reflux symptoms, which respond well to on Timentin use of Prevacid 24HR.   GI Review of Systems    Reports acid reflux.      Denies abdominal pain, belching, bloating, chest pain, dysphagia with liquids, dysphagia with solids, heartburn, loss of appetite, nausea, vomiting, vomiting blood, weight loss, and  weight gain.        Denies anal fissure, black tarry stools, change in bowel habit, constipation, diarrhea, diverticulosis, fecal incontinence, heme positive stool, hemorrhoids, irritable bowel syndrome, jaundice, light color stool, liver problems, rectal bleeding, and  rectal pain.   Current Medications (verified): 1)  Mercaptopurine 50 Mg Tabs (Mercaptopurine) .... Take 1 1/2 Tablets By Mouth Once A Day 2)  Tenormin 25 Mg Tabs (Atenolol) .... Once Daily 3)  Diovan Hct 160-25 Mg  Tabs (Valsartan-Hydrochlorothiazide) .... Take One By Mouth Daily 4)  Lipitor 10 Mg  Tabs (Atorvastatin Calcium) .... Take One By Mouth Daily 5)  Adult Aspirin Ec Low Strength 81 Mg  Tbec (Aspirin) .... Take One By Mouth Daily 6)  Tylenol Pm Extra Strength 500-25 Mg Tabs (Diphenhydramine-Apap (Sleep)) .... As Needed When He Cannot Sleep 7)  Aleve 220 Mg Tabs (Naproxen Sodium) .... As Needed For Back Pain 8)  Zovirax 5 % Oint (Acyclovir) .... Use As Directed 9)  Prevacid 24hr 15 Mg Cpdr (Lansoprazole) .... As Needed  Allergies (verified): 1)  Penicillin G Potassium (Penicillin G Potassium)  Past History:  Past Medical  History: Reviewed history from 12/15/2008 and no changes required. 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 Rotator cuff repair 2009 Lumbar fusion 1983 angioplasty 1983 Cholecystectomy 2002  Family History: Reviewed history from 07/03/2008 and no changes required. father 41 died from COPD mother 36 died from age complication and CVA No FH of Colon Cancer: Family History of Heart Disease: Sister  Social History: Reviewed history from 12/15/2008 and no changes required. Married Never Smoked Alcohol use-no Daily Caffeine Use-5 cups daily Illicit Drug Use - no Patient does not get regular exercise.  Occupation: Apartment Maintenance  Review of Systems       The patient complains of allergy/sinus, arthritis/joint pain, and sleeping problems.  The patient denies anemia, anxiety-new, back pain, blood in urine, breast changes/lumps, change in vision, confusion, cough, coughing up blood, depression-new, fainting, fatigue, fever, headaches-new, hearing problems, heart murmur, heart rhythm changes, itching, menstrual pain, muscle pains/cramps, night sweats, nosebleeds, pregnancy symptoms, shortness of breath, skin rash, sore throat, swelling of feet/legs, swollen lymph glands, thirst - excessive , urination - excessive , urination changes/pain, urine leakage, vision changes, and voice change.    Vital Signs:  Patient profile:   65 year old male Height:      70 inches Weight:      205.13 pounds BMI:     29.54 Pulse rate:   72 / minute Pulse rhythm:  regular BP sitting:   118 / 74  (left arm) Cuff size:   regular  Vitals Entered  By: June McMurray Tracy Deborra Medina) (January 18, 2010 3:00 PM)  Physical Exam  General:  Well developed, well nourished, no acute distress. Head:  Normocephalic and atraumatic. Eyes:  PERRLA, no icterus. Mouth:  No deformity or lesions, dentition normal. Lungs:  Clear throughout to auscultation. Heart:  Regular rate and rhythm; no murmurs, rubs,  or bruits. Abdomen:  Soft, nontender and nondistended. No masses, hepatosplenomegaly or hernias noted. Normal bowel sounds. Psych:  Alert and cooperative. Normal mood and affect.  Impression & Recommendations:  Problem # 1:  ULCERATIVE COLITIS-LEFT SIDE (ICD-556.5) Stable on 6 mercaptopurine. Renew 6-mercaptopurine 75 mg daily. Orders: TLB-CMP (Comprehensive Metabolic Pnl) (70786-LJQG) TLB-CBC Platelet - w/Differential (85025-CBCD) TLB-Lipase (83690-LIPASE)  Problem # 2:  GERD (ICD-530.81) Continue current medication and antireflux measures.  Problem # 3:  COLONIC POLYPS, ADENOMATOUS, HX OF (ICD-V12.72) Surveillance colonoscopy recommend October 2013.  Other Orders: TLB-BMP (Basic Metabolic Panel-BMET) (92010-[id number]) TLB-Hepatic/Liver Function Pnl (80076-HEPATIC)  Patient Instructions: 1)  Get your labs drawn today in the basement.  2)  Pick up your prescription at your pharmacy.  3)  Please schedule a follow-up appointment in 6 months. 4)  Copy sent to : Benay Pillow, MD 5)  The medication list was reviewed and reconciled.  All changed / newly prescribed medications were explained.  A complete medication list was provided to the patient / caregiver.  Prescriptions: MERCAPTOPURINE 50 MG TABS (MERCAPTOPURINE) Take 1 1/2 tablets by mouth once a day  #45 x 5   Entered by:   Marlon Pel CMA ([place])   Authorized by:   Ladene Artist MD [facility]   Signed by:   Ladene Artist MD [facility] on 01/18/2010   Method used:   Electronically to        [facility]. [phone]* (retail)       [address] [place]  [address]       Ph:  [phone]       Fax: [phone]   RxID:   [id number]

## 2010-12-14 NOTE — Letter (Signed)
Summary: Office Visit Letter  [facility]  [address] [place], [place] [address]   Phone: [phone]  Fax: [phone]      July 12, 2010 MRN: [id number]   [facility] [address] [place], [place]  [address]   Dear Mr. Bibbins,   According to our records, it is time for you to schedule a follow-up office visit with [place].   At your convenience, please call [phone] (option #2)to schedule an office visit. If you have any questions, concerns, or feel that this letter is in error, we would appreciate your call.   Sincerely,  Norberto Sorenson T. Fuller Plan, M.D.  [facility] [phone]

## 2010-12-14 NOTE — Progress Notes (Signed)
Summary: speak to nurse   Phone Note Call from Patient Call back at Work Phone [phone]   Caller: Patient Call For: [place] Reason for Call: Talk to Nurse Summary of Call: Patient wants to speak to nurse regarding his acid reflux Initial call taken by: Ronalee Red,  February 03, 2010 11:02 AM  Follow-up for Phone Call        Patient saw Dr Arnoldo Morale last week and he changed his PPI to protonix, no improvement in his symptoms.  He describes his pain as tightness, but doesn't get worse with activity, denies SOB, diaphoresis with symptoms.  Per Dr Arnoldo Morale note ? candida esophagitis due to 6 mp.  Patient  will come in and see Nicoletta Ba PA today at 2:30 Follow-up by: Barb Merino RN, CGRN,  February 03, 2010 11:27 AM

## 2010-12-14 NOTE — Assessment & Plan Note (Signed)
Summary: 2 month rov/njr   Vital Signs:  Patient profile:   65 year old male Height:      70 inches Weight:      200 pounds BMI:     28.80 Temp:     98.2 degrees F oral Pulse rate:   68 / minute Resp:     14 per minute BP sitting:   124 / 78  (left arm)  Vitals Entered By: Allyne Gee, LPN (May 12, 2408 7:35 PM) CC: 2 month roa   CC:  2 month roa.  Preventive Screening-Counseling & Management  Alcohol-Tobacco     Smoking Status: never     Passive Smoke Exposure: no  Current Problems (verified): 1)  Chest Pain  (ICD-786.50) 2)  Acute Gastritis Without Mention of Hemorrhage  (ICD-535.00) 3)  Uri  (ICD-465.9) 4)  Foot Pain, Right  (ICD-729.5) 5)  Ulcerative Colitis-left Side  (ICD-556.5) 6)  Sleep Apnea  (ICD-780.57) 7)  Insect Bite, Venomous  (ICD-989.5) 8)  Prostate Specific Antigen, Elevated  (ICD-790.93) 9)  Lumbar Radiculopathy, Right  (ICD-724.4) 10)  Acute Bronchitis  (ICD-466.0) 11)  Gerd  (ICD-530.81) 12)  Colonic Polyps, Adenomatous, Hx of  (ICD-V12.72) 13)  Trigger Finger  (ICD-727.03) 14)  Preventive Health Care  (ICD-V70.0) 15)  Colonic Polyps, Hx of  (ICD-V12.72) 16)  Sinusitis, Acute Maxillary  (ICD-461.0) 17)  Coronary Artery Disease  (ICD-414.00) 18)  Myocardial Infarction, Hx of  (ICD-412) 19)  Hypertension  (ICD-401.9) 20)  Hyperlipidemia  (ICD-272.4) 21)  Hx, Personal, Colonic Polyps  (ICD-V12.72) 22)  Proctosigmoiditis, 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 Take 1 1/2 Tablets By Mouth Once A Day 2)  Tenormin 25 Mg Tabs (Atenolol) .... Once Daily 3)  Diovan Hct 160-25 Mg  Tabs (Valsartan-Hydrochlorothiazide) .... Take One By Mouth Daily 4)  Lipitor 10 Mg  Tabs (Atorvastatin Calcium) .... Take One By Mouth Daily 5)  Adult Aspirin Ec Low Strength 81 Mg  Tbec (Aspirin) .... Take One By Mouth Daily 6)  Tylenol Pm Extra Strength 500-25 Mg Tabs (Diphenhydramine-Apap  (Sleep)) .... As Needed When He Cannot Sleep 7)  Aleve 220 Mg Tabs (Naproxen Sodium) .... As Needed For Back Pain 8)  Zovirax 5 % Oint (Acyclovir) .... Use As Directed 9)  Nitrostat 0.4 Mg Subl (Nitroglycerin) .... One Sl As Needed Chest Pain Up To Three Then Call Md 10)  Nexium 40 Mg Cpdr (Esomeprazole Magnesium) .... One By Mouth Daily  Allergies (verified): 1)  Penicillin G Potassium (Penicillin G Potassium)   Impression & Recommendations:  Problem # 1:  CHEST PAIN (ICD-786.50) resolved with the nexium  Problem # 2:  GERD (ICD-530.81) imporved His updated medication list for this problem includes:    Nexium 40 Mg Cpdr (Esomeprazole magnesium) ..... One by mouth daily  Labs Reviewed: Hgb: 14.1 (01/18/2010)   Hct: 42.3 (01/18/2010)  Problem # 3:  HYPERTENSION (ICD-401.9)  His updated medication list for this problem includes:    Tenormin 25 Mg Tabs (Atenolol) ..... Once daily    Diovan Hct 160-25 Mg Tabs (Valsartan-hydrochlorothiazide) .Marland Kitchen... Take one by mouth daily  BP today: 124/78 Prior BP: 124/80 (03/10/2010)  Prior 10 Yr Risk Heart Disease: N/A (07/24/2007)  Labs Reviewed: K+: 3.6 (01/18/2010) Creat: : 1.2 (01/18/2010)   Chol: 116 (10/30/2009)   HDL: 27.30 (10/30/2009)   LDL: 69 (10/30/2009)   TG: 100.0 (10/30/2009)  Problem # 4:  HYPERLIPIDEMIA (ICD-272.4)  His updated medication list for  this problem includes:    Lipitor 10 Mg Tabs (Atorvastatin calcium) .Marland Kitchen... Take one by mouth daily  Labs Reviewed: SGOT: 25 (01/18/2010)   SGPT: 28 (01/18/2010)  Prior 10 Yr Risk Heart Disease: N/A (07/24/2007)   HDL:27.30 (10/30/2009), 26.1 (10/24/2008)  LDL:69 (10/30/2009), 67 (10/24/2008)  Chol:116 (10/30/2009), 111 (10/24/2008)  Trig:100.0 (10/30/2009), 90 (10/24/2008)  Problem # 5:  BALANTIDIASIS (ICD-007.0) lotrisome fro rash  Complete Medication List: 1)  Mercaptopurine 50 Mg Tabs (Mercaptopurine) .... Take 1 1/2 tablets by mouth once a day 2)  Tenormin 25 Mg Tabs  (Atenolol) .... Once daily 3)  Diovan Hct 160-25 Mg Tabs (Valsartan-hydrochlorothiazide) .... Take one by mouth daily 4)  Lipitor 10 Mg Tabs (Atorvastatin calcium) .... Take one by mouth daily 5)  Adult Aspirin Ec Low Strength 81 Mg Tbec (Aspirin) .... Take one by mouth daily 6)  Tylenol Pm Extra Strength 500-25 Mg Tabs (Diphenhydramine-apap (sleep)) .... As needed when he cannot sleep 7)  Aleve 220 Mg Tabs (Naproxen sodium) .... As needed for back pain 8)  Zovirax 5 % Oint (Acyclovir) .... Use as directed 9)  Nitrostat 0.4 Mg Subl (Nitroglycerin) .... One sl as needed chest pain up to three then call md 10)  Nexium 40 Mg Cpdr (Esomeprazole magnesium) .... One by mouth daily 11)  Clotrimazole-betamethasone 1-0.05 % Crea (Clotrimazole-betamethasone) .... Apply to site two times a day for 7 days  Patient Instructions: 1)  Please schedule a follow-up appointment in 3 months. skin tags Prescriptions: CLOTRIMAZOLE-BETAMETHASONE 1-0.05 % CREA (CLOTRIMAZOLE-BETAMETHASONE) apply to site two times a day for 7 days  #30cc x 1   Entered and Authorized by:   Ricard Dillon MD   Signed by:   Ricard Dillon MD on 05/12/2010   Method used:   Electronically to        [facility] (retail)       [address]       [place],   [address]       Ph: [phone]       Fax: [phone]   RxID:   [id number]   Appended Document: 2 month rov/njr     Primary Care Provider:  Benay Pillow, MD   History of Present Illness: follow up cardiology visit and GI visit was referred to GI and they though that the chest pain was cardiac was referred to cadiololgy and had a complete work up ( see chanrt) the etiology of the chset pain appears to be GERD and we have assumed work up since the pain has resolved with a PPI  Problems Prior to Update: 1)  Balantidiasis  (ICD-007.0) 2)  Chest Pain  (ICD-786.50) 3)  Acute Gastritis Without Mention of Hemorrhage  (ICD-535.00) 4)  Uri   (ICD-465.9) 5)  Foot Pain, Right  (ICD-729.5) 6)  Ulcerative Colitis-left Side  (ICD-556.5) 7)  Sleep Apnea  (ICD-780.57) 8)  Insect Bite, Venomous  (ICD-989.5) 9)  Prostate Specific Antigen, Elevated  (ICD-790.93) 10)  Lumbar Radiculopathy, Right  (ICD-724.4) 11)  Acute Bronchitis  (ICD-466.0) 12)  Gerd  (ICD-530.81) 13)  Colonic Polyps, Adenomatous, Hx of  (ICD-V12.72) 14)  Trigger Finger  (ICD-727.03) 15)  Preventive Health Care  (ICD-V70.0) 16)  Colonic Polyps, Hx of  (ICD-V12.72) 17)  Sinusitis, Acute Maxillary  (ICD-461.0) 18)  Coronary Artery Disease  (ICD-414.00) 19)  Myocardial Infarction, Hx of  (ICD-412) 20)  Hypertension  (ICD-401.9) 21)  Hyperlipidemia  (ICD-272.4) 22)  Hx, Personal, Colonic Polyps  (ICD-V12.72) 23)  Proctosigmoiditis, 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 Take 1 1/2 Tablets By Mouth Once A Day 2)  Tenormin 25 Mg Tabs (Atenolol) .... Once Daily 3)  Diovan Hct 160-25 Mg  Tabs (Valsartan-Hydrochlorothiazide) .... Take One By Mouth Daily 4)  Lipitor 10 Mg  Tabs (Atorvastatin Calcium) .... Take One By Mouth Daily 5)  Adult Aspirin Ec Low Strength 81 Mg  Tbec (Aspirin) .... Take One By Mouth Daily 6)  Tylenol Pm Extra Strength 500-25 Mg Tabs (Diphenhydramine-Apap (Sleep)) .... As Needed When He Cannot Sleep 7)  Aleve 220 Mg Tabs (Naproxen Sodium) .... As Needed For Back Pain 8)  Zovirax 5 % Oint (Acyclovir) .... Use As Directed 9)  Nitrostat 0.4 Mg Subl (Nitroglycerin) .... One Sl As Needed Chest Pain Up To Three Then Call Md 10)  Nexium 40 Mg Cpdr (Esomeprazole Magnesium) .... One By Mouth Daily 11)  Clotrimazole-Betamethasone 1-0.05 % Crea (Clotrimazole-Betamethasone) .... Apply To Site Two Times A Day For 7 Days  Current Medications (verified): 1)  Mercaptopurine 50 Mg Tabs (Mercaptopurine) .... Take 1 1/2 Tablets By Mouth Once A Day 2)  Tenormin 25 Mg Tabs (Atenolol) .... Once  Daily 3)  Diovan Hct 160-25 Mg  Tabs (Valsartan-Hydrochlorothiazide) .... Take One By Mouth Daily 4)  Lipitor 10 Mg  Tabs (Atorvastatin Calcium) .... Take One By Mouth Daily 5)  Adult Aspirin Ec Low Strength 81 Mg  Tbec (Aspirin) .... Take One By Mouth Daily 6)  Tylenol Pm Extra Strength 500-25 Mg Tabs (Diphenhydramine-Apap (Sleep)) .... As Needed When He Cannot Sleep 7)  Aleve 220 Mg Tabs (Naproxen Sodium) .... As Needed For Back Pain 8)  Zovirax 5 % Oint (Acyclovir) .... Use As Directed 9)  Nitrostat 0.4 Mg Subl (Nitroglycerin) .... One Sl As Needed Chest Pain Up To Three Then Call Md 10)  Nexium 40 Mg Cpdr (Esomeprazole Magnesium) .... One By Mouth Daily 11)  Clotrimazole-Betamethasone 1-0.05 % Crea (Clotrimazole-Betamethasone) .... Apply To Site Two Times A Day For 7 Days  Allergies (verified): 1)  Penicillin G Potassium (Penicillin G Potassium)  Past History:  Family History: Last updated: 01-Aug-2008 father 46 died from [place] mother 22 died from age complication and CVA No FH of Colon Cancer: Family History of Heart Disease: Sister  Social History: Last updated: 12/15/2008 Married Never Smoked Alcohol use-no Daily Caffeine Use-5 cups daily Illicit Drug Use - no Patient does not get regular exercise.  Occupation: Apartment Maintenance  Risk Factors: Caffeine Use: 5+ (12/15/2008) Exercise: no (08/01/08)  Risk Factors: Smoking Status: never (05/12/2010) Passive Smoke Exposure: no (05/12/2010)  Past medical, surgical, family and social histories (including risk factors) reviewed, and no changes noted (except as noted below).  Past Medical History: Reviewed history from 02/03/2010 and no changes required. 1. GERD (ICD-530.81) 2. COLONIC POLYPS, ADENOMATOUS (ICD-211.3) 3. TRIGGER FINGER (ICD-727.03) 4. CORONARY ARTERY DISEASE (ICD-414.00): s/p MI with angioplasty in 1993. Last Myoview was in 2009 and showed EF 69%, no ischemia or infarction.  5. HYPERTENSION  (ICD-401.9) 6. HYPERLIPIDEMIA (ICD-272.4) 7. 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 Cholecystectomy 2002 9. GERD: esophagitis on 1993 EGD.  Past Surgical History: Reviewed history from 2008-08-01 and no changes required. Rotator cuff repair 2009 Lumbar fusion 1983 angioplasty 1983 Cholecystectomy 2002  Family History: Reviewed history from 2008-08-01 and no changes required. father 44 died from COPD mother 9 died from age complication and CVA No FH of Colon  Cancer: Family History of Heart Disease: Sister  Social History: Reviewed history from 12/15/2008 and no changes required. Married Never Smoked Alcohol use-no Daily Caffeine Use-5 cups daily Illicit Drug Use - no Patient does not get regular exercise.  Occupation: Apartment Maintenance  Review of Systems       The patient complains of severe indigestion/heartburn.  The patient denies anorexia, fever, weight loss, weight gain, vision loss, decreased hearing, hoarseness, chest pain, syncope, dyspnea on exertion, peripheral edema, prolonged cough, headaches, hemoptysis, abdominal pain, melena, hematochezia, hematuria, incontinence, genital sores, muscle weakness, suspicious skin lesions, transient blindness, difficulty walking, depression, unusual weight change, abnormal bleeding, enlarged lymph nodes, angioedema, breast masses, and testicular masses.         resolved indigestion  Physical Exam  General:  overweight-appearing.  126/80 Head:  Normocephalic and atraumatic without obvious abnormalities. No apparent alopecia or balding. Eyes:  pupils equal and pupils round.   Ears:  R ear normal and L ear normal.   Nose:  no external deformity and no nasal discharge.   Mouth:  pharynx pink and moist and no erythema.   Lungs:  normal respiratory effort and no wheezes.   Heart:  normal rate and regular rhythm.   Abdomen:  soft and non-tender.   Msk:  normal ROM, no joint swelling, and no joint warmth.    Extremities:  No clubbing, cyanosis, edema, or deformity noted with normal full range of motion of all joints.   Neurologic:  No cranial nerve deficits noted. Station and gait are normal. Plantar reflexes are down-going bilaterally. DTRs are symmetrical throughout. Sensory, motor and coordinative functions appear intact.   Impression & Recommendations:  Problem # 1:  ACUTE GASTRITIS WITHOUT MENTION OF HEMORRHAGE (ICD-535.00) resolved with the PI if recurrent woiuld recommnet EGD His updated medication list for this problem includes:    Nexium 40 Mg Cpdr (Esomeprazole magnesium) ..... One by mouth daily  Complete Medication List: 1)  Mercaptopurine 50 Mg Tabs (Mercaptopurine) .... Take 1 1/2 tablets by mouth once a day 2)  Tenormin 25 Mg Tabs (Atenolol) .... Once daily 3)  Diovan Hct 160-25 Mg Tabs (Valsartan-hydrochlorothiazide) .... Take one by mouth daily 4)  Lipitor 10 Mg Tabs (Atorvastatin calcium) .... Take one by mouth daily 5)  Adult Aspirin Ec Low Strength 81 Mg Tbec (Aspirin) .... Take one by mouth daily 6)  Tylenol Pm Extra Strength 500-25 Mg Tabs (Diphenhydramine-apap (sleep)) .... As needed when he cannot sleep 7)  Aleve 220 Mg Tabs (Naproxen sodium) .... As needed for back pain 8)  Zovirax 5 % Oint (Acyclovir) .... Use as directed 9)  Nitrostat 0.4 Mg Subl (Nitroglycerin) .... One sl as needed chest pain up to three then call md 10)  Nexium 40 Mg Cpdr (Esomeprazole magnesium) .... One by mouth daily 11)  Clotrimazole-betamethasone 1-0.05 % Crea (Clotrimazole-betamethasone) .... Apply to site two times a day for 7 days

## 2010-12-14 NOTE — Progress Notes (Signed)
Summary: cough and fever  Phone Note Call from Patient   Caller: Patient Call For: Ricard Dillon MD Summary of Call: Pt saw Dr. Arnoldo Morale, and is coughing all night and cannot sleep.  Allerex makes him drowsy.  Wants stronger cough meds.  Robitussin cough and cold does not help.  Non productive. Having chills.  Temp:  100. 1 [facility]) Initial call taken by: Deanna Artis CMA,  Mar 15, 2010 9:34 AM  Follow-up for Phone Call        zpack and atuss Follow-up by: Ricard Dillon MD,  Mar 15, 2010 3:04 PM    New/Updated Medications: ATUSS DS 30-4-30 MG/5ML SUSP (PSEUDOEPHED HCL-CPM-DM HBR TAN) 2 teaspoons q 12 hours ZITHROMAX Z-PAK 250 MG TABS (AZITHROMYCIN) as directed Prescriptions: ZITHROMAX Z-PAK 250 MG TABS (AZITHROMYCIN) as directed  #6 x 0   Entered by:   Deanna Artis CMA   Authorized by:   Ricard Dillon MD   Signed by:   Deanna Artis CMA on 03/15/2010   Method used:   Telephoned to ...       [facility] (retail)       [address] [place], [place]  [address]       Ph: [phone]       Fax: [phone]   RxID:   [id number] ATUSS DS 30-4-30 MG/5ML SUSP (PSEUDOEPHED HCL-CPM-DM HBR TAN) 2 teaspoons q 12 hours  #6 oz x 0   Entered by:   Deanna Artis CMA   Authorized by:   Ricard Dillon MD   Signed by:   Deanna Artis CMA on 03/15/2010   Method used:   Telephoned to ...       [facility] (retail)       [address], [place]  [address]       Ph: [phone]       Fax: [phone]   RxID:   [id number] ZITHROMAX Z-PAK 250 MG TABS (AZITHROMYCIN) as directed  #6 x 0   Entered by:   Deanna Artis CMA   Authorized by:   Ricard Dillon MD   Signed by:   Deanna Artis CMA on 03/15/2010   Method used:   Telephoned to ...       [facility] (retail)       [address], [place]  [address]       Ph: [phone]       Fax: [phone]   RxID:   [id number] ATUSS  DS 30-4-30 MG/5ML SUSP (PSEUDOEPHED HCL-CPM-DM HBR TAN) 2 teaspoons q 12 hours  #6 oz x 0   Entered by:   Deanna Artis CMA   Authorized by:   Ricard Dillon MD   Signed by:   Deanna Artis CMA on 03/15/2010   Method used:   Telephoned to ...       [facility] (retail)       [address] [place]  [address]       Ph: [phone]       Fax: [phone]   RxID:   [id number]  Pt. notified.  Appended Document: cough and fever [facility] calls stating Atuss is no longer made???  Substitute? [phone]

## 2010-12-14 NOTE — Assessment & Plan Note (Signed)
Summary: tetanus shot/foot check ok per bonnie/njr   Vital Signs:  Patient profile:   65 year old male Height:      70 inches Weight:      202 pounds BMI:     29.09 Temp:     98.2 degrees F oral Pulse rate:   74 / minute Resp:     14 per minute BP sitting:   126 / 80  (left arm)  Vitals Entered By: Allyne Gee, LPN (September 22, 7671 4:15 PM) CC: c/o stick nail on blottom of rt foot about an inch- Is Patient Diabetic? No   Primary Care Provider:  Benay Pillow, MD  CC:  c/o stick nail on blottom of rt foot about an inch-.  History of Present Illness: deep pentrating wound with Td in 2002 rustly 16 penny nail with deep penitration today   Preventive Screening-Counseling & Management  Alcohol-Tobacco     Smoking Status: never     Passive Smoke Exposure: no     Tobacco Counseling: not indicated; no tobacco use  Problems Prior to Update: 1)  Wound, Open, Foot, With Complications  (CNO-709.6) 2)  Gerd  (ICD-530.81) 3)  Skin Tag  (ICD-701.9) 4)  Balantidiasis  (ICD-007.0) 5)  Chest Pain  (ICD-786.50) 6)  Acute Gastritis Without Mention of Hemorrhage  (ICD-535.00) 7)  Uri  (ICD-465.9) 8)  Foot Pain, Right  (ICD-729.5) 9)  Ulcerative Colitis-left Side  (ICD-556.5) 10)  Sleep Apnea  (ICD-780.57) 11)  Insect Bite, Venomous  (ICD-989.5) 12)  Prostate Specific Antigen, Elevated  (ICD-790.93) 13)  Lumbar Radiculopathy, Right  (ICD-724.4) 14)  Acute Bronchitis  (ICD-466.0) 15)  Gerd  (ICD-530.81) 16)  Colonic Polyps, Adenomatous, Hx of  (ICD-V12.72) 17)  Trigger Finger  (ICD-727.03) 18)  Preventive Health Care  (ICD-V70.0) 19)  Colonic Polyps, Hx of  (ICD-V12.72) 20)  Sinusitis, Acute Maxillary  (ICD-461.0) 21)  Coronary Artery Disease  (ICD-414.00) 22)  Myocardial Infarction, Hx of  (ICD-412) 23)  Hypertension  (ICD-401.9) 24)  Hyperlipidemia  (ICD-272.4) 25)  Hx, Personal, Colonic Polyps  (ICD-V12.72) 26)  Proctosigmoiditis, 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)  Current  Problems (verified): 1)  Gerd  (ICD-530.81) 2)  Skin Tag  (ICD-701.9) 3)  Balantidiasis  (ICD-007.0) 4)  Chest Pain  (ICD-786.50) 5)  Acute Gastritis Without Mention of Hemorrhage  (ICD-535.00) 6)  Uri  (ICD-465.9) 7)  Foot Pain, Right  (ICD-729.5) 8)  Ulcerative Colitis-left Side  (ICD-556.5) 9)  Sleep Apnea  (ICD-780.57) 10)  Insect Bite, Venomous  (ICD-989.5) 11)  Prostate Specific Antigen, Elevated  (ICD-790.93) 12)  Lumbar Radiculopathy, Right  (ICD-724.4) 13)  Acute Bronchitis  (ICD-466.0) 14)  Gerd  (ICD-530.81) 15)  Colonic Polyps, Adenomatous, Hx of  (ICD-V12.72) 16)  Trigger Finger  (ICD-727.03) 17)  Preventive Health Care  (ICD-V70.0) 18)  Colonic Polyps, Hx of  (ICD-V12.72) 19)  Sinusitis, Acute Maxillary  (ICD-461.0) 20)  Coronary Artery Disease  (ICD-414.00) 21)  Myocardial Infarction, Hx of  (ICD-412) 22)  Hypertension  (ICD-401.9) 23)  Hyperlipidemia  (ICD-272.4) 24)  Hx, Personal, Colonic Polyps  (ICD-V12.72) 25)  Proctosigmoiditis, 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 Take 1 1/2 Tablets By Mouth Once A Day 2)  Tenormin 25 Mg Tabs (Atenolol) .... Once Daily 3)  Diovan Hct 160-25 Mg  Tabs (Valsartan-Hydrochlorothiazide) .... Take One By Mouth Daily 4)  Lipitor 10 Mg  Tabs (Atorvastatin Calcium) .... Take One By Mouth Daily 5)  Adult Aspirin  Ec Low Strength 81 Mg  Tbec (Aspirin) .... Take One By Mouth Daily 6)  Tylenol Pm Extra Strength 500-25 Mg Tabs (Diphenhydramine-Apap (Sleep)) .... As Needed When He Cannot Sleep 7)  Aleve 220 Mg Tabs (Naproxen Sodium) .... As Needed For Back Pain 8)  Zovirax 5 % Oint (Acyclovir) .... Use As Directed 9)  Nitrostat 0.4 Mg Subl (Nitroglycerin) .... One Sl As Needed Chest Pain Up To Three Then Call Md 10)  Nexium 40 Mg Cpdr (Esomeprazole Magnesium) .... One By Mouth Daily  Current Medications (verified): 1)  Mercaptopurine 50 Mg Tabs (Mercaptopurine) .... Take  1 1/2 Tablets By Mouth Once A Day 2)  Tenormin 25 Mg Tabs (Atenolol) .... Once Daily 3)  Diovan Hct 160-25 Mg  Tabs (Valsartan-Hydrochlorothiazide) .... Take One By Mouth Daily 4)  Lipitor 10 Mg  Tabs (Atorvastatin Calcium) .... Take One By Mouth Daily 5)  Adult Aspirin Ec Low Strength 81 Mg  Tbec (Aspirin) .... Take One By Mouth Daily 6)  Tylenol Pm Extra Strength 500-25 Mg Tabs (Diphenhydramine-Apap (Sleep)) .... As Needed When He Cannot Sleep 7)  Aleve 220 Mg Tabs (Naproxen Sodium) .... As Needed For Back Pain 8)  Zovirax 5 % Oint (Acyclovir) .... Use As Directed 9)  Nitrostat 0.4 Mg Subl (Nitroglycerin) .... One Sl As Needed Chest Pain Up To Three Then Call Md 10)  Nexium 40 Mg Cpdr (Esomeprazole Magnesium) .... One By Mouth Daily 11)  Cefdinir 300 Mg Caps (Cefdinir) .... One By Mouth Two Times A Day Prior Allergy To Pcn  Noted  Allergies (verified): 1)  Penicillin G Potassium (Penicillin G Potassium)  Past History:  Family History: Last updated: 07/16/08 father 90 died from [place] mother 4 died from age complication and CVA No FH of Colon Cancer: Family History of Heart Disease: Sister  Social History: Last updated: 12/15/2008 Married Never Smoked Alcohol use-no Daily Caffeine Use-5 cups daily Illicit Drug Use - no Patient does not get regular exercise.  Occupation: Apartment Maintenance  Risk Factors: Caffeine Use: 5+ (12/15/2008) Exercise: no (2008/07/16)  Risk Factors: Smoking Status: never (09/21/2010) Passive Smoke Exposure: no (09/21/2010)  Past medical, surgical, family and social histories (including risk factors) reviewed, and no changes noted (except as noted below).  Past Medical History: Reviewed history from 08/23/2010 and no changes required. 1. GERD (ICD-530.81) 2. 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 TRIGGER FINGER (ICD-727.03) 4. CORONARY ARTERY DISEASE (ICD-414.00): s/p MI with angioplasty in 1993. Last Myoview was in 2009 and showed  EF 69%, no ischemia or infarction.  5. HYPERTENSION (ICD-401.9) 6. HYPERLIPIDEMIA (ICD-272.4) 7. 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 Cholecystectomy 2002 9. GERD: esophagitis on 1993 EGD.  Past Surgical History: Reviewed history from 2008/07/16 and no changes required. Rotator cuff repair 2009 Lumbar fusion 1983 angioplasty 1983 Cholecystectomy 2002  Family History: Reviewed history from 07/16/08 and no changes required. father 67 died from COPD mother 83 died from age complication and CVA No FH of Colon Cancer: Family History of Heart Disease: Sister  Social History: Reviewed history from 12/15/2008 and no changes required. Married Never Smoked Alcohol use-no Daily Caffeine Use-5 cups daily Illicit Drug Use - no Patient does not get regular exercise.  Occupation: Apartment Maintenance  Review of Systems  The patient denies anorexia, fever, weight loss, weight gain, vision loss, decreased hearing, hoarseness, chest pain, syncope, dyspnea on exertion, peripheral edema, prolonged cough, headaches, hemoptysis, abdominal pain, melena, hematochezia, severe indigestion/heartburn, hematuria, incontinence, genital sores, muscle weakness, suspicious skin lesions, transient blindness, difficulty  walking, depression, unusual weight change, abnormal bleeding, enlarged lymph nodes, angioedema, and breast masses.    Physical Exam  General:  overweight-appearing.  126/80 Neck:  supple and full ROM.   Lungs:  normal respiratory effort and no wheezes.   Heart:  normal rate and regular rhythm.   Skin:  plantar surface of foot with .3 cm deep puncture   Impression & Recommendations:  Problem # 1:  WOUND, OPEN, FOOT, WITH COMPLICATIONS (BXI-356.8) cefnir 300 two times a day for 10 dasy and local would care if swelling and redness occurs call for referral to surgeon  Complete Medication List: 1)  Mercaptopurine 50 Mg Tabs (Mercaptopurine) .... Take 1 1/2  tablets by mouth once a day 2)  Tenormin 25 Mg Tabs (Atenolol) .... Once daily 3)  Diovan Hct 160-25 Mg Tabs (Valsartan-hydrochlorothiazide) .... Take one by mouth daily 4)  Lipitor 10 Mg Tabs (Atorvastatin calcium) .... Take one by mouth daily 5)  Adult Aspirin Ec Low Strength 81 Mg Tbec (Aspirin) .... Take one by mouth daily 6)  Tylenol Pm Extra Strength 500-25 Mg Tabs (Diphenhydramine-apap (sleep)) .... As needed when he cannot sleep 7)  Aleve 220 Mg Tabs (Naproxen sodium) .... As needed for back pain 8)  Zovirax 5 % Oint (Acyclovir) .... Use as directed 9)  Nitrostat 0.4 Mg Subl (Nitroglycerin) .... One sl as needed chest pain up to three then call md 10)  Nexium 40 Mg Cpdr (Esomeprazole magnesium) .... One by mouth daily 11)  Cefdinir 300 Mg Caps (Cefdinir) .... One by mouth two times a day prior allergy to pcn  noted  Other Orders: Tdap => 20yr IM (([id number] Admin 1st Vaccine ([id number] Prescriptions: CEFDINIR 300 MG CAPS (CEFDINIR) one by mouth two times a day prior allergy to pcn  noted  #20 x 0   Entered and Authorized by:   JRicard DillonMD   Signed by:   JRicard DillonMD on 09/21/2010   Method used:   Electronically to        [facility]       [address]      [place]  [address]      Ph: [phone]      Fax: [phone]  RxID:   [id number]   Orders Added: 1)  Tdap => 763yrIM [9[10301])  Admin 1st Vaccine [9[31438])  Est. Patient Level III [9[88757] Immunizations Administered:  Tetanus Vaccine:    Vaccine Type: Tdap    Site: right deltoid    Mfr: GlaxoSmithKline    Dose: 0.5 ml    Route: IM    Given by: BoAllyne GeeLPN    Exp. Date: 09/02/2012    Lot #: [id number]  VIS given: 10/01/08 version given September 21, 2010.   Immunizations Administered:  Tetanus Vaccine:    Vaccine Type: Tdap    Site: right deltoid    Mfr: GlaxoSmithKline    Dose: 0.5 ml    Route: IM    Given by:  BoAllyne GeeLPN    Exp. Date: 09/02/2012    Lot #: [id number]  VIS given: 10/01/08 version given September 21, 2010.

## 2010-12-14 NOTE — Assessment & Plan Note (Signed)
Summary: 6 month f/u.Marland Kitchenad   History of Present Illness Visit Type: Follow-up Visit Primary GI MD: Joylene Igo MD [facility] Primary Provider: Benay Pillow, MD Requesting Provider: n/a Chief Complaint: F/u for ulcerative colitis and office visit with Amy for epigastric pain and acid reflux. Pt states that he is feeling better and denies any GI complaints  History of Present Illness:   Mr. Hillhouse returns for a 6 month followup. His ulcerative colitis is under complete control. He is maintained on 6-MP for about 3 years. His reflux symptoms are staying under good control on Nexium. He is no gastrointestinal complaints.    GI Review of Systems      Denies abdominal pain, acid reflux, belching, bloating, chest pain, dysphagia with liquids, dysphagia with solids, heartburn, loss of appetite, nausea, vomiting, vomiting blood, weight loss, and  weight gain.        Denies anal fissure, black tarry stools, change in bowel habit, constipation, diarrhea, diverticulosis, fecal incontinence, heme positive stool, hemorrhoids, irritable bowel syndrome, jaundice, light color stool, liver problems, rectal bleeding, and  rectal pain.   Current Medications (verified): 1)  Mercaptopurine 50 Mg Tabs (Mercaptopurine) .... Take 1 1/2 Tablets By Mouth Once A Day 2)  Tenormin 25 Mg Tabs (Atenolol) .... Once Daily 3)  Diovan Hct 160-25 Mg  Tabs (Valsartan-Hydrochlorothiazide) .... Take One By Mouth Daily 4)  Lipitor 10 Mg  Tabs (Atorvastatin Calcium) .... Take One By Mouth Daily 5)  Adult Aspirin Ec Low Strength 81 Mg  Tbec (Aspirin) .... Take One By Mouth Daily 6)  Tylenol Pm Extra Strength 500-25 Mg Tabs (Diphenhydramine-Apap (Sleep)) .... As Needed When He Cannot Sleep 7)  Aleve 220 Mg Tabs (Naproxen Sodium) .... As Needed For Back Pain 8)  Zovirax 5 % Oint (Acyclovir) .... Use As Directed 9)  Nitrostat 0.4 Mg Subl (Nitroglycerin) .... One Sl As Needed Chest Pain Up To Three Then Call Md 10)  Nexium 40 Mg Cpdr  (Esomeprazole Magnesium) .... One By Mouth Daily  Allergies (verified): 1)  Penicillin G Potassium (Penicillin G Potassium)  Past History:  Past Medical History: 1. GERD (ICD-530.81) 2. 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 TRIGGER FINGER (ICD-727.03) 4. CORONARY ARTERY DISEASE (ICD-414.00): s/p MI with angioplasty in 1993. Last Myoview was in 2009 and showed EF 69%, no ischemia or infarction.  5. HYPERTENSION (ICD-401.9) 6. HYPERLIPIDEMIA (ICD-272.4) 7. 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 Cholecystectomy 2002 9. GERD: esophagitis on 1993 EGD.  Past Surgical History: Reviewed history from 07/03/2008 and no changes required. Rotator cuff repair 2009 Lumbar fusion 1983 angioplasty 1983 Cholecystectomy 2002  Family History: Reviewed history from 07/03/2008 and no changes required. father 28 died from COPD mother 2 died from age complication and CVA No FH of Colon Cancer: Family History of Heart Disease: Sister  Social History: Reviewed history from 12/15/2008 and no changes required. Married Never Smoked Alcohol use-no Daily Caffeine Use-5 cups daily Illicit Drug Use - no Patient does not get regular exercise.  Occupation: Apartment Maintenance  Review of Systems       The patient complains of allergy/sinus.         The pertinent positives and negatives are noted as above and in the HPI. All other ROS were reviewed and were negative.  Vital Signs:  Patient profile:   65 year old male Height:      70 inches Weight:      202 pounds BMI:     29.09 BSA:     2.10  Pulse rate:   74 / minute Pulse rhythm:   regular BP sitting:   126 / 80  (left arm) Cuff size:   regular  Vitals Entered By: Hope Pigeon CMA (August 23, 2010 10:59 AM)  Physical Exam  General:  Well developed, well nourished, no acute distress. Head:  Normocephalic and atraumatic. Eyes:  PERRLA, no icterus. Mouth:  No deformity or lesions, dentition  normal. Lungs:  Clear throughout to auscultation. Heart:  Regular rate and rhythm; no murmurs, rubs,  or bruits. Abdomen:  Soft, nontender and nondistended. No masses, hepatosplenomegaly or hernias noted. Normal bowel sounds. Psych:  Alert and cooperative. Normal mood and affect.  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 He is reluctant to do so because his symptoms were not well controlled on 5 ASA therapy. He would like to revisit this discussion in 6 months. Refill 6-mercaptopurine 75 mg daily. Surveillance colonoscopy October 2013. Orders: TLB-BMP (Basic Metabolic Panel-BMET) (59935-TSVXBLT) TLB-Hepatic/Liver Function Pnl (80076-HEPATIC) TLB-CBC Platelet - w/Differential (85025-CBCD) TLB-TSH (Thyroid Stimulating Hormone) (84443-TSH) TLB-Lipase (83690-LIPASE)  Problem # 2:  GERD (ICD-530.81) Continue Nexium 40 mg daily, and standard antireflux measures.  Patient Instructions: 1)  Get your labs drawn today in the basement. 2)  Your prescriptions have been sent to your pharmacy.  3)  Please schedule a follow-up appointment in 6 months. 4)  The medication list was reviewed and reconciled.  All changed / newly prescribed medications were explained.  A complete medication list was provided to the patient / caregiver.  Prescriptions: MERCAPTOPURINE 50 MG TABS (MERCAPTOPURINE) Take 1 1/2 tablets by mouth once a day  #45 x 5   Entered by:   Marlon Pel CMA ([place])   Authorized by:   Ladene Artist MD [facility]   Signed by:   Ladene Artist MD FACG on 08/23/2010   Method used:   Electronically to        [facility] (retail)       [address]       [place], [place]  [address]       Ph: [phone]       Fax: [phone]   RxID:   [id number] NEXIUM 40 MG CPDR (ESOMEPRAZOLE MAGNESIUM) one by mouth daily  #30 x 11   Entered by:   Marlon Pel CMA ([place])   Authorized by:   Ladene Artist MD [facility]   Signed by:   Ladene Artist MD FACG on 08/23/2010   Method used:   Electronically to        [facility] [address] (retail)       [address]       [place], [place]  [address]       Ph: [phone]       Fax: [phone]   RxID:   [id number]   Appended Document: 6 month f/u.Marland Kitchenad    Clinical Lists Changes  Medications: Rx of MERCAPTOPURINE 50 MG TABS (MERCAPTOPURINE) Take 1 1/2 tablets by mouth once a day;  #45 x 5;  Signed;  Entered by: Marlon Pel CMA (AAMA);  Authorized by: Ladene Artist MD Marval Regal;  Method used: Electronically to [facility] [address] [place], [place], [place]  [address], Ph: [phone], Fax: [phone]    Prescriptions: MERCAPTOPURINE 50 MG TABS ([place]) Take  1 1/2 tablets by mouth once a day  #45 x 5   Entered by:   Marlon Pel CMA ([place])   Authorized by:   Ladene Artist MD [facility]   Signed by:   Marlon Pel CMA Deborra Medina) on 08/30/2010   Method used:   Electronically to        [facility] (retail)       [address]       [place], [place]  [address]       Ph: [phone]       Fax: [phone]   RxID:   [id number]

## 2010-12-14 NOTE — Assessment & Plan Note (Signed)
Summary: chest pain/[name]    History of Present Illness Visit Type: Follow-up Visit Primary GI MD: Joylene Igo MD [facility] Primary Provider: Benay Pillow, MD Requesting Provider: n/a Chief Complaint: Epigastric pain, belching, and acid reflux History of Present Illness:   PLEASANT 65 YO MALE KNOWN TO DR. Fuller Plan WITH HX OF LEFT SIDED ULCERATIVE COLITIS. HE WAS JUST IN EARLIER THIS MONTH AND HIS COLITIS IS UNDER GOOD CONTROL ON 6MP 75 MG DAILY.HE SAW DR Arnoldo Morale ABOUT A WEEK AGO BECAUSE HE HAD STARTED HAVING SOME SUBSTERNAL CHEST TIGHTNESS AND PRESSURE,SOME INCREASED BELCHING. HE WAS STARTED ON PROTONIX 40 MG DAILY. HE COMES HERE TODAY BECAUSE HE IS NOT REALLY FEELING ANY BETTER. HE DESCRIBES INTERMITTENT TIGHTNESS AND HEAVINESS IN LOWER CHEST OFF AND ON DAILY X 2 WEEKS,SEEMS TO BE A BIT WORSE THE PAST 2 DAYS. HE DENIES SOB,DIAPHORESIS,DOES ADMIT TO SOME LIGHTHEADEDNESS.SXS NOT NECCESSARILY EXERTIONAL. HE IS NOT SURE HE FEELS ANY DIFFERENT WITH THE PROTONIX. NO ABDOMINAL PAIN,NO NAUSEA. APPETITE FINE, NO DYSPHAGIA,ODYNOPHAGIA,SXSX NOT RELATED TO MEALS. HE DOES HAVE HX OF CAD-IS S/P MI /ANGIOPLASTY 1983. HE IS S/P CHOLECYSTECTOMY 2002.HE HAD AN EGD IN 1993 WITH SOME DISTAL MILD ESOPHAGITIS.   GI Review of Systems    Reports belching and  chest pain.      Denies abdominal pain, acid reflux, bloating, dysphagia with liquids, dysphagia with solids, heartburn, loss of appetite, nausea, vomiting, vomiting blood, and  weight loss.        Denies anal fissure, black tarry stools, change in bowel habit, constipation, diarrhea, diverticulosis, fecal incontinence, heme positive stool, hemorrhoids, irritable bowel syndrome, jaundice, light color stool, liver problems, rectal bleeding, and  rectal pain.    Current Medications (verified): 1)  Mercaptopurine 50 Mg Tabs (Mercaptopurine) .... Take 1 1/2 Tablets By Mouth Once A Day 2)  Tenormin 25 Mg Tabs (Atenolol) .... Once Daily 3)  Diovan Hct 160-25 Mg  Tabs  (Valsartan-Hydrochlorothiazide) .... Take One By Mouth Daily 4)  Lipitor 10 Mg  Tabs (Atorvastatin Calcium) .... Take One By Mouth Daily 5)  Adult Aspirin Ec Low Strength 81 Mg  Tbec (Aspirin) .... Take One By Mouth Daily 6)  Tylenol Pm Extra Strength 500-25 Mg Tabs (Diphenhydramine-Apap (Sleep)) .... As Needed When He Cannot Sleep 7)  Aleve 220 Mg Tabs (Naproxen Sodium) .... As Needed For Back Pain 8)  Zovirax 5 % Oint (Acyclovir) .... Use As Directed 9)  Pantoprazole Sodium 40 Mg Tbec (Pantoprazole Sodium) .... One By Mouth Daily 10)  Nitrostat 0.4 Mg Subl (Nitroglycerin) .... One Sl As Needed Chest Pain Up To Three Then Call Md  Allergies (verified): 1)  Penicillin G Potassium (Penicillin G Potassium)  Past History:  Past Medical History: Reviewed history from 12/15/2008 and no changes required. 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 Rotator cuff repair 2009 Lumbar fusion 1983 angioplasty 1983 Cholecystectomy 2002  Family History: Reviewed history from 07/03/2008 and no changes required. father 65 died from COPD mother 28 died from age complication and CVA No FH of Colon Cancer: Family History of Heart Disease: Sister  Social History: Reviewed history from 12/15/2008 and no changes required. Married Never Smoked Alcohol use-no Daily Caffeine Use-5 cups daily Illicit Drug Use - no Patient does not get regular exercise.  Occupation: Apartment Maintenance  Review of  Systems  The patient denies allergy/sinus, anemia, anxiety-new, arthritis/joint pain, back pain, blood in urine, breast changes/lumps, change in vision,  confusion, cough, coughing up blood, depression-new, fainting, fatigue, fever, headaches-new, hearing problems, heart murmur, heart rhythm changes, itching, muscle pains/cramps, night sweats, nosebleeds, shortness of breath, skin rash, sleeping problems, sore throat, swelling of feet/legs, swollen lymph glands, thirst - excessive, urination - excessive, urination changes/pain, urine leakage, vision changes, and voice change.         ROS OTHERWISE AS IN HPI  Vital Signs:  Patient profile:   65 year old male Height:      70 inches Weight:      204 pounds BMI:     29.38 BSA:     2.11 Pulse rate:   72 / minute Pulse rhythm:   regular BP sitting:   132 / 80  (left arm) Cuff size:   regular  Vitals Entered By: Hope Pigeon CMA (February 03, 2010 2:20 PM)  Physical Exam  General:  Well developed, well nourished, no acute distress. Head:  Normocephalic and atraumatic. Eyes:  PERRLA, no icterus. Lungs:  Clear throughout to auscultation. Heart:  Regular rate and rhythm; no murmurs, rubs,  or bruits. Abdomen:  SOFT, NONTENDER, NO MASS OR HSM,BS+ Rectal:  NOT DONE Extremities:  No clubbing, cyanosis, edema or deformities noted. Neurologic:  Alert and  oriented x4;  grossly normal neurologically. Psych:  Alert and cooperative. Normal mood and affect.   Impression & Recommendations:  Problem # 1:  CHEST PAIN (ICD-786.50) Assessment New 65 YO MALE WITH HX OF C.A.D ,S/P MI 1983/ANGIOPLASTY  WITH 2 WEEK HX OF INTERMITTENT CHEST PAIN,TIGHTNESS,HEAVINESS. I AM NOT CONVINCED THIS REPRESENTS  REFLUX/GERD ,CONCERNED THIS MAY BE ANGINA- SOME LIGHTHEADENESS AS WELL.   WILL INCREASE PROTONIX TO 40 MG TWICE DAILY HAVE OBTAINED APPT WITH CARDIOLOGY TODAY -IF CARDIAC EVAL IS NEGATIVE AND HE IS CLEARED,WE MAY CONSIDER EGD.  Problem # 2:  ULCERATIVE COLITIS-LEFT SIDE (ICD-556.5) Assessment: Improved STABLE ON 6 MP 75 MG DAILY  Problem # 3:  SLEEP APNEA (ICD-780.57) Assessment: Comment Only  Problem #  4:  COLONIC POLYPS, ADENOMATOUS, HX OF (ICD-V12.72) Assessment: Comment Only  Problem # 5:  CORONARY ARTERY DISEASE (ICD-414.00) Assessment: Comment Only  Problem # 6:  HYPERTENSION (ICD-401.9) Assessment: Comment Only  Problem # 7:  HYPERLIPIDEMIA (ICD-272.4) Assessment: Comment Only  Patient Instructions: 1)  Increase the Protonix to twice daily. 2)  GO to [facility] this afternoon right away.   3)  You will see Dr. Marigene Ehlers.  4)  The medication list was reviewed and reconciled.  All changed / newly prescribed medications were explained.  A complete medication list was provided to the patient / caregiver.

## 2010-12-14 NOTE — Progress Notes (Signed)
Summary: Patient not feeling better  Phone Note Call from Patient Call back at [phone]   Caller: Patient Summary of Call: Patient is not feeling any better.  Please call Initial call taken by: Kennon Rounds,  February 03, 2010 10:42 AM  Follow-up for Phone Call        talked with pt and his gerd ihas not improved --suggested he call dr stark for ov  Follow-up by: Allyne Gee, LPN,  February 04, 3275 10:48 AM     Appended Document: Patient not feeling better per dr Angela Nevin- double pantoprazole- pt inforemd

## 2010-12-14 NOTE — Progress Notes (Signed)
Summary: Nuclear pre procedure  Phone Note Other Incoming   Summary of Call: Reviewed information on Myoview Information Sheet (see scanned document for further details).  Desiree Lucy spoke with patient while here for OV.  Initial call taken by: Valetta Fuller, [place],  February 03, 2010 5:38 PM     Nuclear Med Background Indications for Stress Test: Evaluation for Ischemia, PTCA Patency   History: Angioplasty, Heart Catheterization, Myocardial Infarction, Myocardial Perfusion Study  History Comments: 2/93 AWMI; 5/93 PTCA-LAD; 12/93 Cath:n/o  CAD, EF=60%; '09 [id number], EF=69%, HTN response; h/o ulcerative colitis  Symptoms: Chest Pain, Chest Tightness, Light-Headedness    Nuclear Pre-Procedure Cardiac Risk Factors: Family History - CAD, History of Smoking, Hypertension, Lipids Height (in): 70

## 2010-12-14 NOTE — Assessment & Plan Note (Signed)
Summary: Cardiology Nuclear Study  Nuclear Med Background Indications for Stress Test: Evaluation for Ischemia, PTCA Patency   History: Angioplasty, Heart Catheterization, Myocardial Infarction, Myocardial Perfusion Study  History Comments: 2/93 AWMI; 5/93 PTCA-LAD; 12/93 Cath:n/o  CAD, EF=60%; '09 [id number], EF=69%, HTN response; h/o ulcerative colitis  Symptoms: Chest Pain, Chest Pressure, Chest Tightness, Light-Headedness    Nuclear Pre-Procedure Cardiac Risk Factors: Family History - CAD, History of Smoking, Hypertension, Lipids Caffeine/Decaff Intake: None NPO After: 7:30 AM Lungs: clear IV 0.9% NS with Angio Cath: 20g     IV Site: (R) AC IV Started by: Irven Baltimore RN Chest Size (in) 44     Height (in): 70 Weight (lb): 200 BMI: 28.80 Tech Comments: Held tenormin 24 hrs. P. Edwards,RN. This patient came in with chest pressure and it remained that way through the treadmill portion of his test.  Nuclear Med Study 1 or 2 day study:  1 day     Stress Test Type:  Stress Reading MD:  Glori Bickers, MD     Referring MD:  D.McLean  J.Jenkins Dr.Stark Resting Radionuclide:  Technetium 74mTetrofosmin     Resting Radionuclide Dose:  11.0 mCi  Stress Radionuclide:  Technetium 926metrofosmin     Stress Radionuclide Dose:  33.0 mCi   Stress Protocol Exercise Time (min):  9:00 min     Max HR:  139 bpm     Predicted Max HR:  15354pm  Max Systolic BP: 20656m Hg     Percent Max HR:  87.97 %     METS: 10.4 Rate Pressure Product:  2881275  Stress Test Technologist:  SaPerrin MalteseMT-P     Nuclear Technologist:  ElVedia PereyraNMT  Rest Procedure  Myocardial perfusion imaging was performed at rest 45 minutes following the intravenous administration of Myoview Technetium 9940mtrofosmin.  Stress Procedure  The patient exercised for 9:00. The patient stopped due to fatigue and chest pressure.  There were non specific ST-T wave changes.  Myoview was injected at peak exercise  and myocardial perfusion imaging was performed after a brief delay.  QPS Raw Data Images:  Normal; no motion artifact; normal heart/lung ratio. Stress Images:  There is normal uptake in all areas. Rest Images:  Normal homogeneous uptake in all areas of the myocardium. Subtraction (SDS):  Normal Transient Ischemic Dilatation:  .92  (Normal <1.22)  Lung/Heart Ratio:  .31  (Normal <0.45)  Quantitative Gated Spect Images QGS EDV:  71 ml QGS ESV:  22 ml QGS EF:  69 % QGS cine images:  Normal  Findings Normal nuclear study      Overall Impression  Exercise Capacity: Good exercise capacity. BP Response: Hypertensive blood pressure response. Clinical Symptoms: Mild chest pressure ECG Impression: No significant ST segment change suggestive of ischemia. Overall Impression: Normal stress nuclear study.  Appended Document: Cardiology Nuclear Study Negative study. Suspect symptoms due to GERD.   Appended Document: Cardiology Nuclear Study discussed results with pt by telephone

## 2010-12-14 NOTE — Assessment & Plan Note (Signed)
Summary: [name]/bmw   Vital Signs:  Patient profile:   65 year old male Height:      70 inches Weight:      201 pounds BMI:     28.94 Temp:     98.2 degrees F oral Pulse rate:   68 / minute Resp:     14 per minute BP sitting:   136 / 80  (left arm)  Vitals Entered By: Allyne Gee, LPN (August 14, 6711 4:25 PM) CC: [name]--c/o pain in upper left rib area, Hypertension Management Is Patient Diabetic? No   Primary Care Provider:  Benay Pillow, MD  CC:  [name]--c/o pain in upper left rib area and Hypertension Management.  History of Present Illness: the pt is on mercaptopurine for colitis he reports mild pain in the left ribs that is increased when he uses the recliner the pain has persisted for over a month no rash   Hypertension History:      He denies headache, chest pain, palpitations, dyspnea with exertion, orthopnea, PND, peripheral edema, visual symptoms, neurologic problems, syncope, and side effects from treatment.        Positive major cardiovascular risk factors include male age 2 years old or older, hyperlipidemia, and hypertension.  Negative major cardiovascular risk factors include negative family history for ischemic heart disease and non-tobacco-user status.        Positive history for target organ damage include ASHD (either angina/prior MI/prior CABG).     Preventive Screening-Counseling & Management  Alcohol-Tobacco     Smoking Status: never     Passive Smoke Exposure: no     Tobacco Counseling: not indicated; no tobacco use  Problems Prior to Update: 1)  Gerd  (ICD-530.81) 2)  Skin Tag  (ICD-701.9) 3)  Balantidiasis  (ICD-007.0) 4)  Chest Pain  (ICD-786.50) 5)  Acute Gastritis Without Mention of Hemorrhage  (ICD-535.00) 6)  Uri  (ICD-465.9) 7)  Foot Pain, Right  (ICD-729.5) 8)  Ulcerative Colitis-left Side  (ICD-556.5) 9)  Sleep Apnea  (ICD-780.57) 10)  Insect Bite, Venomous  (ICD-989.5) 11)  Prostate Specific Antigen, Elevated  (ICD-790.93) 12)   Lumbar Radiculopathy, Right  (ICD-724.4) 13)  Acute Bronchitis  (ICD-466.0) 14)  Gerd  (ICD-530.81) 15)  Colonic Polyps, Adenomatous, Hx of  (ICD-V12.72) 16)  Trigger Finger  (ICD-727.03) 17)  Preventive Health Care  (ICD-V70.0) 18)  Colonic Polyps, Hx of  (ICD-V12.72) 19)  Sinusitis, Acute Maxillary  (ICD-461.0) 20)  Coronary Artery Disease  (ICD-414.00) 21)  Myocardial Infarction, Hx of  (ICD-412) 22)  Hypertension  (ICD-401.9) 23)  Hyperlipidemia  (ICD-272.4) 24)  Hx, Personal, Colonic Polyps  (ICD-V12.72) 25)  Proctosigmoiditis, 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)  Current Problems (verified): 1)  Balantidiasis  (ICD-007.0) 2)  Chest Pain  (ICD-786.50) 3)  Acute Gastritis Without Mention of Hemorrhage  (ICD-535.00) 4)  Uri  (ICD-465.9) 5)  Foot Pain, Right  (ICD-729.5) 6)  Ulcerative Colitis-left Side  (ICD-556.5) 7)  Sleep Apnea  (ICD-780.57) 8)  Insect Bite, Venomous  (ICD-989.5) 9)  Prostate Specific Antigen, Elevated  (ICD-790.93) 10)  Lumbar Radiculopathy, Right  (ICD-724.4) 11)  Acute Bronchitis  (ICD-466.0) 12)  Gerd  (ICD-530.81) 13)  Colonic Polyps, Adenomatous, Hx of  (ICD-V12.72) 14)  Trigger Finger  (ICD-727.03) 15)  Preventive Health Care  (ICD-V70.0) 16)  Colonic Polyps, Hx of  (ICD-V12.72) 17)  Sinusitis, Acute Maxillary  (ICD-461.0) 18)  Coronary Artery Disease  (ICD-414.00) 19)  Myocardial Infarction, Hx of  (ICD-412) 20)  Hypertension  (ICD-401.9) 21)  Hyperlipidemia  (  ICD-272.4) 22)  Hx, Personal, Colonic Polyps  (ICD-V12.72) 23)  Proctosigmoiditis, 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 Take 1 1/2 Tablets By Mouth Once A Day 2)  Tenormin 25 Mg Tabs (Atenolol) .... Once Daily 3)  Diovan Hct 160-25 Mg  Tabs (Valsartan-Hydrochlorothiazide) .... Take One By Mouth Daily 4)  Lipitor 10 Mg  Tabs (Atorvastatin Calcium) .... Take One By Mouth Daily 5)  Adult Aspirin Ec Low Strength 81 Mg  Tbec  (Aspirin) .... Take One By Mouth Daily 6)  Tylenol Pm Extra Strength 500-25 Mg Tabs (Diphenhydramine-Apap (Sleep)) .... As Needed When He Cannot Sleep 7)  Aleve 220 Mg Tabs (Naproxen Sodium) .... As Needed For Back Pain 8)  Zovirax 5 % Oint (Acyclovir) .... Use As Directed 9)  Nitrostat 0.4 Mg Subl (Nitroglycerin) .... One Sl As Needed Chest Pain Up To Three Then Call Md 10)  Nexium 40 Mg Cpdr (Esomeprazole Magnesium) .... One By Mouth Daily 11)  Clotrimazole-Betamethasone 1-0.05 % Crea (Clotrimazole-Betamethasone) .... Apply To Site Two Times A Day For 7 Days  Current Medications (verified): 1)  Mercaptopurine 50 Mg Tabs (Mercaptopurine) .... Take 1 1/2 Tablets By Mouth Once A Day 2)  Tenormin 25 Mg Tabs (Atenolol) .... Once Daily 3)  Diovan Hct 160-25 Mg  Tabs (Valsartan-Hydrochlorothiazide) .... Take One By Mouth Daily 4)  Lipitor 10 Mg  Tabs (Atorvastatin Calcium) .... Take One By Mouth Daily 5)  Adult Aspirin Ec Low Strength 81 Mg  Tbec (Aspirin) .... Take One By Mouth Daily 6)  Tylenol Pm Extra Strength 500-25 Mg Tabs (Diphenhydramine-Apap (Sleep)) .... As Needed When He Cannot Sleep 7)  Aleve 220 Mg Tabs (Naproxen Sodium) .... As Needed For Back Pain 8)  Zovirax 5 % Oint (Acyclovir) .... Use As Directed 9)  Nitrostat 0.4 Mg Subl (Nitroglycerin) .... One Sl As Needed Chest Pain Up To Three Then Call Md 10)  Nexium 40 Mg Cpdr (Esomeprazole Magnesium) .... One By Mouth Daily 11)  Clotrimazole-Betamethasone 1-0.05 % Crea (Clotrimazole-Betamethasone) .... Apply To Site Two Times A Day For 7 Days  Allergies (verified): 1)  Penicillin G Potassium (Penicillin G Potassium)  Past History:  Family History: Last updated: 2008-07-24 father 59 died from [place] mother 35 died from age complication and CVA No FH of Colon Cancer: Family History of Heart Disease: Sister  Social History: Last updated: 12/15/2008 Married Never Smoked Alcohol use-no Daily Caffeine Use-5 cups daily Illicit Drug  Use - no Patient does not get regular exercise.  Occupation: Apartment Maintenance  Risk Factors: Caffeine Use: 5+ (12/15/2008) Exercise: no (07-24-08)  Risk Factors: Smoking Status: never (08/13/2010) Passive Smoke Exposure: no (08/13/2010)  Past medical, surgical, family and social histories (including risk factors) reviewed, and no changes noted (except as noted below).  Past Medical History: Reviewed history from 02/03/2010 and no changes required. 1. GERD (ICD-530.81) 2. COLONIC POLYPS, ADENOMATOUS (ICD-211.3) 3. TRIGGER FINGER (ICD-727.03) 4. CORONARY ARTERY DISEASE (ICD-414.00): s/p MI with angioplasty in 1993. Last Myoview was in 2009 and showed EF 69%, no ischemia or infarction.  5. HYPERTENSION (ICD-401.9) 6. HYPERLIPIDEMIA (ICD-272.4) 7. 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 Cholecystectomy 2002 9. GERD: esophagitis on 1993 EGD.  Past Surgical History: Reviewed history from 07-24-08 and no changes required. Rotator cuff repair 2009 Lumbar fusion 1983 angioplasty 1983 Cholecystectomy 2002  Family History: Reviewed history from 07/24/2008 and no changes required. father 20 died from COPD mother 33 died from age complication and CVA No  FH of Colon Cancer: Family History of Heart Disease: Sister  Social History: Reviewed history from 12/15/2008 and no changes required. Married Never Smoked Alcohol use-no Daily Caffeine Use-5 cups daily Illicit Drug Use - no Patient does not get regular exercise.  Occupation: Apartment Maintenance  Review of Systems  The patient denies anorexia, fever, weight loss, weight gain, vision loss, decreased hearing, hoarseness, chest pain, syncope, dyspnea on exertion, peripheral edema, prolonged cough, headaches, hemoptysis, abdominal pain, melena, hematochezia, severe indigestion/heartburn, hematuria, incontinence, genital sores, muscle weakness, suspicious skin lesions, transient blindness, difficulty  walking, depression, unusual weight change, abnormal bleeding, enlarged lymph nodes, angioedema, and breast masses.         Flu Vaccine Consent Questions     Do you have a history of severe allergic reactions to this vaccine? no    Any prior history of allergic reactions to egg and/or gelatin? no    Do you have a sensitivity to the preservative Thimersol? no    Do you have a past history of Guillan-Barre Syndrome? no    Do you currently have an acute febrile illness? no    Have you ever had a severe reaction to latex? no    Vaccine information given and explained to patient? yes    Are you currently pregnant? no    Lot Number:[id number]   Exp Date:05/14/2011   Site Given  Left Deltoid IM   Physical Exam  General:  overweight-appearing.  126/80 Head:  Normocephalic and atraumatic without obvious abnormalities. No apparent alopecia or balding. Eyes:  pupils equal and pupils round.   Ears:  R ear normal and L ear normal.   Nose:  no external deformity and no nasal discharge.   Mouth:  pharynx pink and moist and no erythema.   Neck:  supple and full ROM.   Lungs:  normal respiratory effort and no wheezes.   Heart:  normal rate and regular rhythm.   Abdomen:  soft and non-tender.     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 Once daily    Diovan Hct 160-25 Mg Tabs (Valsartan-hydrochlorothiazide) .Marland Kitchen... Take one by mouth daily  BP today: 136/80 Prior BP: 124/78 (05/12/2010)  Prior 10 Yr Risk Heart Disease: N/A (07/24/2007)  Labs Reviewed: K+: 3.6 (01/18/2010) Creat: : 1.2 (01/18/2010)   Chol: 116 (10/30/2009)   HDL: 27.30 (10/30/2009)   LDL: 69 (10/30/2009)   TG: 100.0 (10/30/2009)  Problem # 3:  SKIN TAG (ICD-701.9) skin take removed form under right and left axilla  Problem # 4:  HYPERLIPIDEMIA  (ICD-272.4)  His updated medication list for this problem includes:    Lipitor 10 Mg Tabs (Atorvastatin calcium) .Marland Kitchen... Take one by mouth daily  Labs Reviewed: SGOT: 25 (01/18/2010)   SGPT: 28 (01/18/2010)  Prior 10 Yr Risk Heart Disease: N/A (07/24/2007)   HDL:27.30 (10/30/2009), 26.1 (10/24/2008)  LDL:69 (10/30/2009), 67 (10/24/2008)  Chol:116 (10/30/2009), 111 (10/24/2008)  Trig:100.0 (10/30/2009), 90 (10/24/2008)  Complete Medication List: 1)  Mercaptopurine 50 Mg Tabs (Mercaptopurine) .... Take 1 1/2 tablets by mouth once a day 2)  Tenormin 25 Mg Tabs (Atenolol) .... Once daily 3)  Diovan Hct 160-25 Mg Tabs (Valsartan-hydrochlorothiazide) .... Take one by mouth daily 4)  Lipitor 10 Mg Tabs (Atorvastatin calcium) .... Take one by mouth daily 5)  Adult Aspirin Ec Low Strength 81 Mg  Tbec (Aspirin) .... Take one by mouth daily 6)  Tylenol Pm Extra Strength 500-25 Mg Tabs (Diphenhydramine-apap (sleep)) .... As needed when he cannot sleep 7)  Aleve 220 Mg Tabs (Naproxen sodium) .... As needed for back pain 8)  Zovirax 5 % Oint (Acyclovir) .... Use as directed 9)  Nitrostat 0.4 Mg Subl (Nitroglycerin) .... One sl as needed chest pain up to three then call md 10)  Nexium 40 Mg Cpdr (Esomeprazole magnesium) .... One by mouth daily  Other Orders: Admin 1st Vaccine [phone]) Flu Vaccine 74yr + [phone]  Hypertension Assessment/Plan:      The patient's hypertensive risk group is category C: Target organ damage and/or diabetes.  Today's blood pressure is 136/80.  His blood pressure goal is < 140/90.  Patient Instructions: 1)  Please schedule a follow-up appointment in 3 months.

## 2010-12-14 NOTE — Progress Notes (Signed)
Summary: head congestion/chills  Medications Added ACYCLOVIR 800 MG TABS (ACYCLOVIR) Take 1 tablet by mouth four times a day COLAZAL 750 MG CAPS (BALSALAZIDE DISODIUM) Take 3 capsule by mouth three times a day HYDROCODONE-ACETAMINOPHEN 5-500 MG TABS (HYDROCODONE-ACETAMINOPHEN) Take 1 tablet by mouth every six hours MERCAPTOPURINE 50 MG TABS (MERCAPTOPURINE) Take 1 1/2 tablet by mouth once a day PREVACID SOLUTAB 30 MG TBDP (LANSOPRAZOLE) 1 tablet by mouth every morning TENORMIN 25 MG TABS (ATENOLOL)       Allergies Added: PENICILLIN G POTASSIUM (PENICILLIN G POTASSIUM) Phone Note Call from Patient Call back at Work Phone [phone]   Caller: Patient Call For: DR Dewitt Hoes Reason for Call: Acute Illness, Talk to Nurse Summary of Call: [place]. PLEASE CALL. HE ALSO HAS CHILLS. Initial call taken by: Despina Arias,  June 04, 2007 10:41 AM  Follow-up for Phone Call        called #s listed no answer on H# no vm setup on other #.  to Dr Lenna Sciara for answer symptoms given Follow-up by: Shelbie Hutching, RN,  June 04, 2007 11:47 AM  Additional Follow-up for Phone Call Additional follow up Details #1::        Zpack and decongestant AllerX dose PK (PE) may call in to pharmacy Additional Follow-up by: Ricard Dillon MD,  June 04, 2007 1:23 PM   New Allergies: PENICILLIN G POTASSIUM (PENICILLIN G POTASSIUM) Additional Follow-up for Phone Call Additional follow up Details #2::    med called in to [facility] [phone] and pt notified on home #.  Number left [phone] still has no voicemail setup and does not answer  New/Updated Medications: ACYCLOVIR 800 MG TABS (ACYCLOVIR) Take 1 tablet by mouth four times a day COLAZAL 750 MG CAPS (BALSALAZIDE DISODIUM) Take 3 capsule by mouth three times a day HYDROCODONE-ACETAMINOPHEN 5-500 MG TABS (HYDROCODONE-ACETAMINOPHEN) Take 1 tablet by mouth every six hours MERCAPTOPURINE 50 MG TABS (MERCAPTOPURINE) Take 1 1/2 tablet by mouth once  a day PREVACID SOLUTAB 30 MG TBDP (LANSOPRAZOLE) 1 tablet by mouth every morning TENORMIN 25 MG TABS (ATENOLOL)  New Allergies: PENICILLIN G POTASSIUM (PENICILLIN G POTASSIUM)

## 2010-12-14 NOTE — Assessment & Plan Note (Signed)
Summary: 4 MONTH ROV/NJR   Vital Signs:  Patient profile:   65 year old male Height:      70 inches Weight:      202 pounds BMI:     29.09 Temp:     98.2 degrees F oral Pulse rate:   60 / minute Resp:     14 per minute BP sitting:   124 / 80  (left arm)  Vitals Entered By: Allyne Gee, LPN (March 10, 1609 9:60 PM) CC: roa- c/o seasonal allergies, Hypertension Management   Primary Care Provider:  Benay Pillow, MD  CC:  roa- c/o seasonal allergies and Hypertension Management.  History of Present Illness: The pt went to GI for GERD symptoms and was sent urgently to cardiology that workup was negative and the pain imporved with the PPI he is concerned that the "protonix" was not strong enough and believed that the nexium controlled his symptoms better he has been off the PPI for two weeks with only occasional symptoms The pt also has raised the head of the bed and pursued lifestyle modifications.   Hypertension History:      He denies headache, chest pain, palpitations, dyspnea with exertion, orthopnea, PND, peripheral edema, visual symptoms, neurologic problems, syncope, and side effects from treatment.        Positive major cardiovascular risk factors include male age 92 years old or older, hyperlipidemia, and hypertension.  Negative major cardiovascular risk factors include negative family history for ischemic heart disease and non-tobacco-user status.        Positive history for target organ damage include ASHD (either angina/prior MI/prior CABG).     Preventive Screening-Counseling & Management  Alcohol-Tobacco     Smoking Status: never     Passive Smoke Exposure: no  Problems Prior to Update: 1)  Chest Pain  (ICD-786.50) 2)  Acute Gastritis Without Mention of Hemorrhage  (ICD-535.00) 3)  Uri  (ICD-465.9) 4)  Foot Pain, Right  (ICD-729.5) 5)  Ulcerative Colitis-left Side  (ICD-556.5) 6)  Sleep Apnea  (ICD-780.57) 7)  Insect Bite, Venomous  (ICD-989.5) 8)   Prostate Specific Antigen, Elevated  (ICD-790.93) 9)  Lumbar Radiculopathy, Right  (ICD-724.4) 10)  Acute Bronchitis  (ICD-466.0) 11)  Gerd  (ICD-530.81) 12)  Colonic Polyps, Adenomatous, Hx of  (ICD-V12.72) 13)  Trigger Finger  (ICD-727.03) 14)  Preventive Health Care  (ICD-V70.0) 15)  Colonic Polyps, Hx of  (ICD-V12.72) 16)  Sinusitis, Acute Maxillary  (ICD-461.0) 17)  Coronary Artery Disease  (ICD-414.00) 18)  Myocardial Infarction, Hx of  (ICD-412) 19)  Hypertension  (ICD-401.9) 20)  Hyperlipidemia  (ICD-272.4) 21)  Hx, Personal, Colonic Polyps  (ICD-V12.72) 22)  Proctosigmoiditis, 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)  Current Problems (verified): 1)  Chest Pain  (ICD-786.50) 2)  Acute Gastritis Without Mention of Hemorrhage  (ICD-535.00) 3)  Uri  (ICD-465.9) 4)  Foot Pain, Right  (ICD-729.5) 5)  Ulcerative Colitis-left Side  (ICD-556.5) 6)  Sleep Apnea  (ICD-780.57) 7)  Insect Bite, Venomous  (ICD-989.5) 8)  Prostate Specific Antigen, Elevated  (ICD-790.93) 9)  Lumbar Radiculopathy, Right  (ICD-724.4) 10)  Acute Bronchitis  (ICD-466.0) 11)  Gerd  (ICD-530.81) 12)  Colonic Polyps, Adenomatous, Hx of  (ICD-V12.72) 13)  Trigger Finger  (ICD-727.03) 14)  Preventive Health Care  (ICD-V70.0) 15)  Colonic Polyps, Hx of  (ICD-V12.72) 16)  Sinusitis, Acute Maxillary  (ICD-461.0) 17)  Coronary Artery Disease  (ICD-414.00) 18)  Myocardial Infarction, Hx of  (ICD-412) 19)  Hypertension  (ICD-401.9) 20)  Hyperlipidemia  (ICD-272.4)  21)  Hx, Personal, Colonic Polyps  (ICD-V12.72) 22)  Proctosigmoiditis, 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 Take 1 1/2 Tablets By Mouth Once A Day 2)  Tenormin 25 Mg Tabs (Atenolol) .... Once Daily 3)  Diovan Hct 160-25 Mg  Tabs (Valsartan-Hydrochlorothiazide) .... Take One By Mouth Daily 4)  Lipitor 10 Mg  Tabs (Atorvastatin Calcium) .... Take One By Mouth Daily 5)  Adult Aspirin Ec Low Strength  81 Mg  Tbec (Aspirin) .... Take One By Mouth Daily 6)  Tylenol Pm Extra Strength 500-25 Mg Tabs (Diphenhydramine-Apap (Sleep)) .... As Needed When He Cannot Sleep 7)  Aleve 220 Mg Tabs (Naproxen Sodium) .... As Needed For Back Pain 8)  Zovirax 5 % Oint (Acyclovir) .... Use As Directed 9)  Pantoprazole Sodium 40 Mg Tbec (Pantoprazole Sodium) .... One By Mouth Daily 10)  Nitrostat 0.4 Mg Subl (Nitroglycerin) .... One Sl As Needed Chest Pain Up To Three Then Call Md  Current Medications (verified): 1)  Mercaptopurine 50 Mg Tabs (Mercaptopurine) .... Take 1 1/2 Tablets By Mouth Once A Day 2)  Tenormin 25 Mg Tabs (Atenolol) .... Once Daily 3)  Diovan Hct 160-25 Mg  Tabs (Valsartan-Hydrochlorothiazide) .... Take One By Mouth Daily 4)  Lipitor 10 Mg  Tabs (Atorvastatin Calcium) .... Take One By Mouth Daily 5)  Adult Aspirin Ec Low Strength 81 Mg  Tbec (Aspirin) .... Take One By Mouth Daily 6)  Tylenol Pm Extra Strength 500-25 Mg Tabs (Diphenhydramine-Apap (Sleep)) .... As Needed When He Cannot Sleep 7)  Aleve 220 Mg Tabs (Naproxen Sodium) .... As Needed For Back Pain 8)  Zovirax 5 % Oint (Acyclovir) .... Use As Directed 9)  Nitrostat 0.4 Mg Subl (Nitroglycerin) .... One Sl As Needed Chest Pain Up To Three Then Call Md  Allergies (verified): 1)  Penicillin G Potassium (Penicillin G Potassium)  Past History:  Family History: Last updated: 07/11/08 father 19 died from COPD mother 57 died from age complication and CVA No FH of Colon Cancer: Family History of Heart Disease: Sister  Social History: Last updated: 12/15/2008 Married Never Smoked Alcohol use-no Daily Caffeine Use-5 cups daily Illicit Drug Use - no Patient does not get regular exercise.  Occupation: Apartment Maintenance  Risk Factors: Caffeine Use: 5+ (12/15/2008) Exercise: no (Jul 11, 2008)  Risk Factors: Smoking Status: never (03/10/2010) Passive Smoke Exposure: no (03/10/2010)  Past medical, surgical, family and  social histories (including risk factors) reviewed, and no changes noted (except as noted below).  Past Medical History: Reviewed history from 02/03/2010 and no changes required. 1. GERD (ICD-530.81) 2. COLONIC POLYPS, ADENOMATOUS (ICD-211.3) 3. TRIGGER FINGER (ICD-727.03) 4. CORONARY ARTERY DISEASE (ICD-414.00): s/p MI with angioplasty in 1993. Last Myoview was in 2009 and showed EF 69%, no ischemia or infarction.  5. HYPERTENSION (ICD-401.9) 6. HYPERLIPIDEMIA (ICD-272.4) 7. 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 Cholecystectomy 2002 9. GERD: esophagitis on 1993 EGD.  Past Surgical History: Reviewed history from 07/11/08 and no changes required. Rotator cuff repair 2009 Lumbar fusion 1983 angioplasty 1983 Cholecystectomy 2002  Family History: Reviewed history from 2008/07/11 and no changes required. father 34 died from COPD mother 44 died from age complication and CVA No FH of Colon Cancer: Family History of Heart Disease: Sister  Social History: Reviewed history from 12/15/2008 and no changes required. Married Never Smoked Alcohol use-no Daily Caffeine Use-5 cups daily Illicit Drug Use - no Patient does not get regular exercise.  Occupation: Apartment Maintenance  Review of  Systems       The patient complains of hoarseness and prolonged cough.  The patient denies anorexia, fever, weight loss, weight gain, vision loss, decreased hearing, chest pain, syncope, dyspnea on exertion, peripheral edema, headaches, hemoptysis, abdominal pain, melena, hematochezia, severe indigestion/heartburn, hematuria, incontinence, genital sores, muscle weakness, suspicious skin lesions, transient blindness, difficulty walking, depression, unusual weight change, abnormal bleeding, enlarged lymph nodes, angioedema, breast masses, and testicular masses.    Physical Exam  General:  overweight-appearing.  126/80 Head:  Normocephalic and atraumatic without obvious  abnormalities. No apparent alopecia or balding. Eyes:  pupils equal and pupils round.   Ears:  R ear normal and L ear normal.   Nose:  no external deformity and no nasal discharge.   Neck:  supple and full ROM.   Lungs:  normal respiratory effort and no wheezes.   Heart:  normal rate and regular rhythm.   Abdomen:  soft and non-tender.   Msk:  normal ROM, no joint swelling, and no joint warmth.     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 One by mouth daily His updated medication list for this problem includes:    Nexium 40 Mg Cpdr (Esomeprazole magnesium) ..... One by mouth daily  Labs Reviewed: Hgb: 14.1 (01/18/2010)   Hct: 42.3 (01/18/2010)  Problem # 4:  HYPERTENSION (ICD-401.9) Assessment: Unchanged  His updated medication list for this problem includes:    Tenormin 25 Mg Tabs (Atenolol) ..... Once daily    Diovan Hct 160-25 Mg Tabs (Valsartan-hydrochlorothiazide) .Marland Kitchen... Take one by mouth daily  BP today: 124/80 Prior BP: 130/82 (02/03/2010)  Prior 10 Yr Risk Heart Disease: N/A (07/24/2007)  Labs Reviewed: K+: 3.6 (01/18/2010) Creat: : 1.2 (01/18/2010)   Chol: 116 (10/30/2009)   HDL: 27.30 (10/30/2009)   LDL: 69 (10/30/2009)   TG: 100.0 (10/30/2009)  Complete Medication List: 1)  Mercaptopurine 50 Mg Tabs (Mercaptopurine) .... Take 1 1/2 tablets by mouth once a day 2)  Tenormin 25 Mg Tabs (Atenolol) .... Once daily 3)  Diovan Hct 160-25 Mg Tabs (Valsartan-hydrochlorothiazide) .... Take one by mouth daily 4)  Lipitor 10 Mg Tabs (Atorvastatin calcium) .... Take one by mouth daily 5)  Adult  Aspirin Ec Low Strength 81 Mg Tbec (Aspirin) .... Take one by mouth daily 6)  Tylenol Pm Extra Strength 500-25 Mg Tabs (Diphenhydramine-apap (sleep)) .... As needed when he cannot sleep 7)  Aleve 220 Mg Tabs (Naproxen sodium) .... As needed for back pain 8)  Zovirax 5 % Oint (Acyclovir) .... Use as directed 9)  Nitrostat 0.4 Mg Subl (Nitroglycerin) .... One sl as needed chest pain up to three then call md 10)  Nexium 40 Mg Cpdr (Esomeprazole magnesium) .... One by mouth daily  Hypertension Assessment/Plan:      The patient's hypertensive risk group is category C: Target organ damage and/or diabetes.  Today's blood pressure is 124/80.  His blood pressure goal is < 140/90.  Patient Instructions: 1)  Please schedule a follow-up appointment in 2 months. Prescriptions: NEXIUM 40 MG CPDR (ESOMEPRAZOLE MAGNESIUM) one by mouth daily  #30 x 11   Entered and Authorized by:  Ricard Dillon MD   Signed by:   Ricard Dillon MD on 03/10/2010   Method used:   Electronically to        [facility]. [phone]* (retail)       [address] [place]  [address]       Ph: [phone]       Fax: [phone]   RxID:   [id number]

## 2010-12-14 NOTE — Progress Notes (Signed)
Summary: refills   Phone Note Call from Patient Call back at Home Phone [phone]   Caller: Patient Call For: Fuller Plan Reason for Call: Talk to Nurse Summary of Call: Patient would like refills for his MERCAPTOPURINE 50 MG TABS until his appt 01-18-2010 Initial call taken by: Ronalee Red,  December 22, 2009 12:25 PM  Follow-up for Phone Call        Rx was sent to pts pharmacy and pt notified to keep upcoming appt for further refills.  Follow-up by: Marlon Pel CMA Deborra Medina),  December 22, 2009 2:09 PM    Prescriptions: MERCAPTOPURINE 50 MG TABS (MERCAPTOPURINE) Take 1 1/2 tablets by mouth once a day  #45 x 0   Entered by:   Marlon Pel CMA ([place])   Authorized by:   Ladene Artist MD [facility]   Signed by:   Marlon Pel CMA ([place]) on 12/22/2009   Method used:   Electronically to        [facility] [phone]* (retail)       [address] [place]  [address]       Ph: [phone]       Fax: [phone]   RxID:   [id number]

## 2010-12-14 NOTE — Assessment & Plan Note (Signed)
Summary: gerd/dm   Vital Signs:  Patient profile:   65 year old male Height:      70 inches Weight:      205 pounds BMI:     29.52 Temp:     98.2 degrees F oral Pulse rate:   72 / minute Resp:     14 per minute BP sitting:   120 / 80  (left arm)  Vitals Entered By: Allyne Gee, LPN (January 27, 6386 5:64 PM)  Nutrition Counseling: Patient's BMI is greater than 25 and therefore counseled on weight management options. CC: c/o increased gerd with increased burping--taking prevacid with little relief-- wants something stronger, Abdominal Pain   Primary Care Provider:  Benay Pillow, MD  CC:  c/o increased gerd with increased burping--taking prevacid with little relief-- wants something stronger and Abdominal Pain.  History of Present Illness: Pt has atypical chest discomfort and had seen dr Fuller Plan in the past for this non radiating, non exertional, mid sternal ( similar to prior pains from gerd) and not followed b weakness or nausea.  He has been taking OTC meds and this has not helped . These include prevacid 15 mg and tums He has increased burping as well No lightheadedness or dark stools has labs at Dr Fuller Plan and these were normal ( he was seen for the colon on study drug)   Dyspepsia History:      He has no alarm features of dyspepsia including no history of melena, hematochezia, dysphagia, persistent vomiting, or involuntary weight loss > 5%.  There is a prior history of GERD.  He notes that there have been breakthrough symptoms despite maximum H-2 blocker or PPI therapy.  The patient does not have a prior history of documented ulcer disease.  The dominant symptom is heartburn or acid reflux.  An H-2 blocker medication is currently being taken.  He notes that the symptoms have not improved with the H-2 blocker therapy.  Symptoms have persisted after 4 weeks of H-2 blocker treatment.  He has no history of a positive H. Pylori serology.  No previous upper endoscopy has been done.       Preventive Screening-Counseling & Management  Alcohol-Tobacco     Smoking Status: never     Passive Smoke Exposure: no  Problems Prior to Update: 1)  Uri  (ICD-465.9) 2)  Foot Pain, Right  (ICD-729.5) 3)  Ulcerative Colitis-left Side  (ICD-556.5) 4)  Sleep Apnea  (ICD-780.57) 5)  Insect Bite, Venomous  (ICD-989.5) 6)  Prostate Specific Antigen, Elevated  (ICD-790.93) 7)  Lumbar Radiculopathy, Right  (ICD-724.4) 8)  Acute Bronchitis  (ICD-466.0) 9)  Gerd  (ICD-530.81) 10)  Colonic Polyps, Adenomatous, Hx of  (ICD-V12.72) 11)  Trigger Finger  (ICD-727.03) 12)  Preventive Health Care  (ICD-V70.0) 13)  Colonic Polyps, Hx of  (ICD-V12.72) 14)  Sinusitis, Acute Maxillary  (ICD-461.0) 15)  Coronary Artery Disease  (ICD-414.00) 16)  Myocardial Infarction, Hx of  (ICD-412) 17)  Hypertension  (ICD-401.9) 18)  Hyperlipidemia  (ICD-272.4) 19)  Hx, Personal, Colonic Polyps  (ICD-V12.72) 20)  Proctosigmoiditis, 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)  Current Problems (verified): 1)  Uri  (ICD-465.9) 2)  Foot Pain, Right  (ICD-729.5) 3)  Ulcerative Colitis-left Side  (ICD-556.5) 4)  Sleep Apnea  (ICD-780.57) 5)  Insect Bite, Venomous  (ICD-989.5) 6)  Prostate Specific Antigen, Elevated  (ICD-790.93) 7)  Lumbar Radiculopathy, Right  (ICD-724.4) 8)  Acute Bronchitis  (ICD-466.0) 9)  Gerd  (ICD-530.81) 10)  Colonic Polyps, Adenomatous, Hx of  (  ICD-V12.72) 11)  Trigger Finger  (ICD-727.03) 12)  Preventive Health Care  (ICD-V70.0) 13)  Colonic Polyps, Hx of  (ICD-V12.72) 14)  Sinusitis, Acute Maxillary  (ICD-461.0) 15)  Coronary Artery Disease  (ICD-414.00) 16)  Myocardial Infarction, Hx of  (ICD-412) 17)  Hypertension  (ICD-401.9) 18)  Hyperlipidemia  (ICD-272.4) 19)  Hx, Personal, Colonic Polyps  (ICD-V12.72) 20)  Proctosigmoiditis, 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 Take 1 1/2 Tablets By Mouth Once A Day 2)  Tenormin 25  Mg Tabs (Atenolol) .... Once Daily 3)  Diovan Hct 160-25 Mg  Tabs (Valsartan-Hydrochlorothiazide) .... Take One By Mouth Daily 4)  Lipitor 10 Mg  Tabs (Atorvastatin Calcium) .... Take One By Mouth Daily 5)  Adult Aspirin Ec Low Strength 81 Mg  Tbec (Aspirin) .... Take One By Mouth Daily 6)  Tylenol Pm Extra Strength 500-25 Mg Tabs (Diphenhydramine-Apap (Sleep)) .... As Needed When He Cannot Sleep 7)  Aleve 220 Mg Tabs (Naproxen Sodium) .... As Needed For Back Pain 8)  Zovirax 5 % Oint (Acyclovir) .... Use As Directed 9)  Prevacid 24hr 15 Mg Cpdr (Lansoprazole) .... As Needed  Current Medications (verified): 1)  Mercaptopurine 50 Mg Tabs (Mercaptopurine) .... Take 1 1/2 Tablets By Mouth Once A Day 2)  Tenormin 25 Mg Tabs (Atenolol) .... Once Daily 3)  Diovan Hct 160-25 Mg  Tabs (Valsartan-Hydrochlorothiazide) .... Take One By Mouth Daily 4)  Lipitor 10 Mg  Tabs (Atorvastatin Calcium) .... Take One By Mouth Daily 5)  Adult Aspirin Ec Low Strength 81 Mg  Tbec (Aspirin) .... Take One By Mouth Daily 6)  Tylenol Pm Extra Strength 500-25 Mg Tabs (Diphenhydramine-Apap (Sleep)) .... As Needed When He Cannot Sleep 7)  Aleve 220 Mg Tabs (Naproxen Sodium) .... As Needed For Back Pain 8)  Zovirax 5 % Oint (Acyclovir) .... Use As Directed 9)  Prevacid 24hr 15 Mg Cpdr (Lansoprazole) .... As Needed  Allergies (verified): 1)  Penicillin G Potassium (Penicillin G Potassium)  Past History:  Family History: Last updated: 07-20-2008 father 49 died from [place] mother 52 died from age complication and CVA No FH of Colon Cancer: Family History of Heart Disease: Sister  Social History: Last updated: 12/15/2008 Married Never Smoked Alcohol use-no Daily Caffeine Use-5 cups daily Illicit Drug Use - no Patient does not get regular exercise.  Occupation: Apartment Maintenance  Risk Factors: Caffeine Use: 5+ (12/15/2008) Exercise: no (07-20-08)  Risk Factors: Smoking Status: never  (01/27/2010) Passive Smoke Exposure: no (01/27/2010)  Past medical, surgical, family and social histories (including risk factors) reviewed, and no changes noted (except as noted below).  Past Medical History: Reviewed history from 12/15/2008 and no changes required. 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 Rotator cuff repair 2009 Lumbar fusion 1983 angioplasty 1983 Cholecystectomy 2002  Family History: Reviewed history from 2008-07-20 and no changes required. father 12 died from COPD mother 4 died from age complication and CVA No FH of Colon Cancer: Family History of Heart Disease: Sister  Social History: Reviewed history from 12/15/2008 and no changes required. Married Never Smoked Alcohol use-no Daily Caffeine Use-5 cups daily Illicit Drug Use - no Patient does not get regular exercise.  Occupation: Apartment Maintenance  Review of Systems  The patient complains of abdominal pain.  The patient denies anorexia, fever, weight loss, weight gain, vision loss, decreased hearing, hoarseness, chest pain, syncope, dyspnea on exertion, peripheral edema, prolonged cough, headaches, hemoptysis, melena, hematochezia, severe indigestion/heartburn, hematuria, incontinence, genital sores, muscle weakness, suspicious skin lesions, transient blindness, difficulty walking, depression, unusual weight change, abnormal bleeding, enlarged lymph nodes, angioedema, breast masses, and testicular masses.         GERD  Physical Exam  General:  overweight-appearing.  126/80 Head:  Normocephalic and atraumatic without obvious  abnormalities. No apparent alopecia or balding. Eyes:  pupils equal and pupils round.   Ears:  R ear normal and L ear normal.   Nose:  no external deformity and no nasal discharge.   Neck:  No deformities, masses, or tenderness noted. Lungs:  normal respiratory effort and no wheezes.   Heart:  normal rate and regular rhythm.   Abdomen:  soft, non-tender, and normal bowel sounds.   Msk:  No deformity or scoliosis noted of thoracic or lumbar spine.   Pulses:  R and L carotid,radial,femoral,dorsalis pedis and posterior tibial pulses are full and equal bilaterally Extremities:  No clubbing, cyanosis, edema, or deformity noted with normal full range of motion of all joints.   Neurologic:  No cranial nerve deficits noted. Station and gait are normal. Plantar reflexes are down-going bilaterally. DTRs are symmetrical throughout. Sensory, motor and coordinative functions appear intact.   Impression & Recommendations:  Problem # 1:  GERD (ICD-530.81) the pain appears to be gastritis His updated medication list for this problem includes:    Pantoprazole Sodium 40 Mg Tbec (Pantoprazole sodium) ..... One by mouth daily  Labs Reviewed: Hgb: 14.1 (01/18/2010)   Hct: 42.3 (01/18/2010)  Problem # 2:  ACUTE GASTRITIS WITHOUT MENTION OF HEMORRHAGE (ICD-535.00) moniter for h pylori and treat for reflux His updated medication list for this problem includes:    Pantoprazole Sodium 40 Mg Tbec (Pantoprazole sodium) ..... One by mouth daily  Discussed use of medication, as well as lifestyle changes.   Orders: Fingerstick (36416) TLB-H. Pylori Abs(Helicobacter Pylori) ([id number])  Problem # 3:  ULCERATIVE COLITIS-LEFT SIDE (ICD-556.5) consider that this might represent candidal esphagitis and may need EGD   he is on mercatopurine?  Complete Medication List: 1)  Mercaptopurine 50 Mg Tabs (Mercaptopurine) .... Take 1 1/2 tablets by mouth once a day 2)  Tenormin 25 Mg Tabs (Atenolol) .... Once  daily 3)  Diovan Hct 160-25 Mg Tabs (Valsartan-hydrochlorothiazide) .... Take one by mouth daily 4)  Lipitor 10 Mg Tabs (Atorvastatin calcium) .... Take one by mouth daily 5)  Adult Aspirin Ec Low Strength 81 Mg Tbec (Aspirin) .... Take one by mouth daily 6)  Tylenol Pm Extra Strength 500-25 Mg Tabs (Diphenhydramine-apap (sleep)) .... As needed when he cannot sleep 7)  Aleve 220 Mg Tabs (Naproxen sodium) .... As needed for back pain 8)  Zovirax 5 % Oint (Acyclovir) .... Use as directed 9)  Pantoprazole Sodium 40 Mg Tbec (Pantoprazole sodium) .... One by mouth daily 10)  Nitrostat 0.4 Mg Subl (Nitroglycerin) .... One sl as needed chest pain up to three then call md  Patient Instructions: 1)  Please schedule a follow-up appointment as needed. Prescriptions: NITROSTAT 0.4 MG SUBL (NITROGLYCERIN) one SL as needed chest pain up to three then call MD  #50 x 0   Entered and Authorized by:   Ricard Dillon MD   Signed by:   Ricard Dillon MD on 01/27/2010   Method used:  Electronically to        [facility]. [phone]* (retail)       [address], [place]  [address]       Ph: [phone]       Fax: [phone]   RxID:   [id number]

## 2010-12-14 NOTE — Assessment & Plan Note (Signed)
Summary: np6/chest pain/jml      Allergies Added:    Referring Provider:  n/a Primary Provider:  Benay Pillow, MD  CC:  new patient/ chest pain.  Marland Kitchen  History of Present Illness: 65 yo with history of CAD s/p MI with angioplasty in 1993 presents for evaluation of chest pain.  Patient has been having chest pain x 2 weeks, daily.  He feels lower chest tightness that is relatively mild.  It comes and goes and can last up to an hour at a time.  Nothing in particular brings it on, not food and not exertion.  He does not get an acid taste in his mouth.  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  Climbing ladders, etc. does not cause chest pain.  He often notes the chest pain when he first wakes up in the morning.  No exertional dyspnea.  He has a history of GERD and was started on Protonix last week because of the chest pain.  He was seen by GI today.  They were concerned because his pain was not resolving with PPI and he was sent for cardiology evaluation.   ECG: NSR at 52, mild 1st degree AV block.   Current Medications (verified): 1)  Mercaptopurine 50 Mg Tabs (Mercaptopurine) .... Take 1 1/2 Tablets By Mouth Once A Day 2)  Tenormin 25 Mg Tabs (Atenolol) .... Once Daily 3)  Diovan Hct 160-25 Mg  Tabs (Valsartan-Hydrochlorothiazide) .... Take One By Mouth Daily 4)  Lipitor 10 Mg  Tabs (Atorvastatin Calcium) .... Take One By Mouth Daily 5)  Adult Aspirin Ec Low Strength 81 Mg  Tbec (Aspirin) .... Take One By Mouth Daily 6)  Tylenol Pm Extra Strength 500-25 Mg Tabs (Diphenhydramine-Apap (Sleep)) .... As Needed When He Cannot Sleep 7)  Aleve 220 Mg Tabs (Naproxen Sodium) .... As Needed For Back Pain 8)  Zovirax 5 % Oint (Acyclovir) .... Use As Directed 9)  Pantoprazole Sodium 40 Mg Tbec (Pantoprazole Sodium) .... One By Mouth Daily 10)  Nitrostat 0.4 Mg Subl (Nitroglycerin) .... One Sl As Needed Chest Pain Up To Three Then Call Md  Allergies (verified): 1)  Penicillin G Potassium  (Penicillin G Potassium)  Past History:  Past Medical History: 1. GERD (ICD-530.81) 2. COLONIC POLYPS, ADENOMATOUS (ICD-211.3) 3. TRIGGER FINGER (ICD-727.03) 4. CORONARY ARTERY DISEASE (ICD-414.00): s/p MI with angioplasty in 1993. Last Myoview was in 2009 and showed EF 69%, no ischemia or infarction.  5. HYPERTENSION (ICD-401.9) 6. HYPERLIPIDEMIA (ICD-272.4) 7. 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 Cholecystectomy 2002 9. GERD: esophagitis on 1993 EGD.  Family History: Reviewed history from 07/03/2008 and no changes required. father 43 died from COPD mother 41 died from age complication and CVA No FH of Colon Cancer: Family History of Heart Disease: Sister  Social History: Reviewed history from 12/15/2008 and no changes required. Married Never Smoked Alcohol use-no Daily Caffeine Use-5 cups daily Illicit Drug Use - no Patient does not get regular exercise.  Occupation: Apartment Maintenance  Review of [place] reviewed and negative except as per HPI.   Vital Signs:  Patient profile:   65 year old male Height:      70 inches Weight:      204 pounds Pulse rate:   52 / minute Pulse rhythm:   regular BP sitting:   130 / 82  (left arm) Cuff size:   large  Vitals Entered By: Doug Sou CMA (February 03, 2010 4:27 PM)  Physical Exam  General:  Well developed, well nourished, in no acute distress.  Mildly obese.  Head:  normocephalic and atraumatic Nose:  no deformity, discharge, inflammation, or lesions Mouth:  Teeth, gums and palate normal. Oral mucosa normal. Neck:  Neck supple, no JVD. No masses, thyromegaly or abnormal cervical nodes. Lungs:  Clear bilaterally to auscultation and percussion. Heart:  Non-displaced PMI, chest non-tender; regular rate and rhythm, S1, S2 without rubs or gallops. 1/6 early systolic murmur RUSB.  Carotid upstroke normal, no bruit. Pedals normal pulses. 1+ ankle edema.  Abdomen:  Bowel sounds  positive; abdomen soft and non-tender without masses, organomegaly, or hernias noted. No hepatosplenomegaly. Msk:  Back normal, normal gait. Muscle strength and tone normal. Extremities:  No clubbing or cyanosis. Neurologic:  Alert and oriented x 3. Skin:  Intact without lesions or rashes. Psych:  Normal affect.   Impression & Recommendations:  Problem # 1:  CHEST PAIN (ICD-786.50) Atypical chest pain in a patient with known CAD.  Inflammatory disease (ulcerative colitis) also increases his coronary disease risk.  He had a normal myoview back in 2009.  His pain is substernal and is not relieved by Protonix.  He has an intermediate risk of obstructive CAD.  I will set him up for an ETT-myoview which will be done tomorrow.  He needs to continue ASA, beta blocker (hold am of myoview though), ARB, statin.   Problem # 2:  HYPERLIPIDEMIA (TGG-269.4) Followed by Dr. Arnoldo Morale.  Goal LDL < 70 given known CAD.   Other Orders: EKG w/ Interpretation (93000) Nuclear Stress Test (Nuc Stress Test)  Patient Instructions: 1)  Your physician has requested that you have an exercise stress myoview.  For further information please visit [email or web address].  Please follow instruction sheet, as given. 2)  Your physician wants you to follow-up in: 1 year with Dr Aundra Dubin.  You will receive a reminder letter in the mail two months in advance. If you don't receive a letter, please call our office to schedule the follow-up appointment.

## 2010-12-16 NOTE — Assessment & Plan Note (Signed)
Summary: cpx/njr/pt rescd from bump//ccm   Vital Signs:  Patient profile:   65 year old male Height:      70 inches Weight:      202 pounds BMI:     29.09 Temp:     98.3 degrees F oral Pulse rate:   72 / minute Resp:     14 per minute BP sitting:   126 / 80  (left arm)  Vitals Entered By: Allyne Gee, LPN (November 17, 1694 4:21 PM) CC: cpx Is Patient Diabetic? No   Primary Care Provider:  Benay Pillow, MD  CC:  cpx.  History of Present Illness: The pt was asked about all immunizations, health maint. services that are appropriate to their age and was given guidance on diet exercize  and weight management   Preventive Screening-Counseling & Management  Alcohol-Tobacco     Smoking Status: never     Passive Smoke Exposure: no     Tobacco Counseling: not indicated; no tobacco use  Problems Prior to Update: 1)  Wound, Open, Foot, With Complications  (VEL-381.0) 2)  Gerd  (ICD-530.81) 3)  Skin Tag  (ICD-701.9) 4)  Balantidiasis  (ICD-007.0) 5)  Chest Pain  (ICD-786.50) 6)  Acute Gastritis Without Mention of Hemorrhage  (ICD-535.00) 7)  Uri  (ICD-465.9) 8)  Foot Pain, Right  (ICD-729.5) 9)  Ulcerative Colitis-left Side  (ICD-556.5) 10)  Sleep Apnea  (ICD-780.57) 11)  Insect Bite, Venomous  (ICD-989.5) 12)  Prostate Specific Antigen, Elevated  (ICD-790.93) 13)  Lumbar Radiculopathy, Right  (ICD-724.4) 14)  Acute Bronchitis  (ICD-466.0) 15)  Gerd  (ICD-530.81) 16)  Colonic Polyps, Adenomatous, Hx of  (ICD-V12.72) 17)  Trigger Finger  (ICD-727.03) 18)  Preventive Health Care  (ICD-V70.0) 19)  Colonic Polyps, Hx of  (ICD-V12.72) 20)  Sinusitis, Acute Maxillary  (ICD-461.0) 21)  Coronary Artery Disease  (ICD-414.00) 22)  Myocardial Infarction, Hx of  (ICD-412) 23)  Hypertension  (ICD-401.9) 24)  Hyperlipidemia  (ICD-272.4) 25)  Hx, Personal, Colonic Polyps  (ICD-V12.72) 26)  Proctosigmoiditis, 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)  Current Problems (verified): 1)  Wound,  Open, Foot, With Complications  (FBP-102.5) 2)  Gerd  (ICD-530.81) 3)  Skin Tag  (ICD-701.9) 4)  Balantidiasis  (ICD-007.0) 5)  Chest Pain  (ICD-786.50) 6)  Acute Gastritis Without Mention of Hemorrhage  (ICD-535.00) 7)  Uri  (ICD-465.9) 8)  Foot Pain, Right  (ICD-729.5) 9)  Ulcerative Colitis-left Side  (ICD-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.5) 10)  Sleep Apnea  (ICD-780.57) 11)  Insect Bite, Venomous  (ICD-989.5) 12)  Prostate Specific Antigen, Elevated  (ICD-790.93) 13)  Lumbar Radiculopathy, Right  (ICD-724.4) 14)  Acute Bronchitis  (ICD-466.0) 15)  Gerd  (ICD-530.81) 16)  Colonic Polyps, Adenomatous, Hx of  (ICD-V12.72) 17)  Trigger Finger  (ICD-727.03) 18)  Preventive Health Care  (ICD-V70.0) 19)  Colonic Polyps, Hx of  (ICD-V12.72) 20)  Sinusitis, Acute Maxillary  (ICD-461.0) 21)  Coronary Artery Disease  (ICD-414.00) 22)  Myocardial Infarction, Hx of  (ICD-412) 23)  Hypertension  (ICD-401.9) 24)  Hyperlipidemia  (ICD-272.4) 25)  Hx, Personal, Colonic Polyps  (ICD-V12.72) 26)  Proctosigmoiditis, 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 Take 1 1/2 Tablets By Mouth Once A Day 2)  Tenormin 25 Mg Tabs (Atenolol) .... Once Daily 3)  Diovan Hct 160-25 Mg  Tabs (Valsartan-Hydrochlorothiazide) .... Take One By Mouth Daily 4)  Lipitor 10 Mg  Tabs (Atorvastatin Calcium) .... Take One By Mouth Daily 5)  Adult Aspirin Ec  Low Strength 81 Mg  Tbec (Aspirin) .... Take One By Mouth Daily 6)  Tylenol Pm Extra Strength 500-25 Mg Tabs (Diphenhydramine-Apap (Sleep)) .... As Needed When He Cannot Sleep 7)  Aleve 220 Mg Tabs (Naproxen Sodium) .... As Needed For Back Pain 8)  Zovirax 5 % Oint (Acyclovir) .... Use As Directed 9)  Nitrostat 0.4 Mg Subl (Nitroglycerin) .... One Sl As Needed Chest Pain Up To Three Then Call Md 10)  Nexium 40 Mg Cpdr (Esomeprazole Magnesium) .... One By Mouth Daily 11)  Cefdinir 300 Mg Caps (Cefdinir) .... One By Mouth Two Times A  Day Prior Allergy To Pcn  Noted  Current Medications (verified): 1)  Mercaptopurine 50 Mg Tabs (Mercaptopurine) .... Take 1 1/2 Tablets By Mouth Once A Day 2)  Tenormin 25 Mg Tabs (Atenolol) .... Once Daily 3)  Diovan Hct 160-25 Mg  Tabs (Valsartan-Hydrochlorothiazide) .... Take One By Mouth Daily 4)  Lipitor 10 Mg  Tabs (Atorvastatin Calcium) .... Take One By Mouth Daily 5)  Adult Aspirin Ec Low Strength 81 Mg  Tbec (Aspirin) .... Take One By Mouth Daily 6)  Tylenol Pm Extra Strength 500-25 Mg Tabs (Diphenhydramine-Apap (Sleep)) .... As Needed When He Cannot Sleep 7)  Aleve 220 Mg Tabs (Naproxen Sodium) .... As Needed For Back Pain 8)  Zovirax 5 % Oint (Acyclovir) .... Use As Directed 9)  Nitrostat 0.4 Mg Subl (Nitroglycerin) .... One Sl As Needed Chest Pain Up To Three Then Call Md 10)  Nexium 40 Mg Cpdr (Esomeprazole Magnesium) .... One By Mouth Daily  Allergies (verified): 1)  Penicillin G Potassium (Penicillin G Potassium)  Past History:  Family History: Last updated: 07-13-08 father 87 died from [place] mother 99 died from age complication and CVA No FH of Colon Cancer: Family History of Heart Disease: Sister  Social History: Last updated: 12/15/2008 Married Never Smoked Alcohol use-no Daily Caffeine Use-5 cups daily Illicit Drug Use - no Patient does not get regular exercise.  Occupation: Apartment Maintenance  Risk Factors: Caffeine Use: 5+ (12/15/2008) Exercise: no (July 13, 2008)  Risk Factors: Smoking Status: never (11/17/2010) Passive Smoke Exposure: no (11/17/2010)  Past medical, surgical, family and social histories (including risk factors) reviewed, and no changes noted (except as noted below).  Past Medical History: Reviewed history from 08/23/2010 and no changes required. 1. GERD (ICD-530.81) 2. 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 TRIGGER FINGER (ICD-727.03) 4. CORONARY ARTERY DISEASE (ICD-414.00): s/p MI with angioplasty in 1993. Last  Myoview was in 2009 and showed EF 69%, no ischemia or infarction.  5. HYPERTENSION (ICD-401.9) 6. HYPERLIPIDEMIA (ICD-272.4) 7. 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 Cholecystectomy 2002 9. GERD: esophagitis on 1993 EGD.  Past Surgical History: Reviewed history from 2008/07/13 and no changes required. Rotator cuff repair 2009 Lumbar fusion 1983 angioplasty 1983 Cholecystectomy 2002  Family History: Reviewed history from 2008/07/13 and no changes required. father 24 died from COPD mother 43 died from age complication and CVA No FH of Colon Cancer: Family History of Heart Disease: Sister  Social History: Reviewed history from 12/15/2008 and no changes required. Married Never Smoked Alcohol use-no Daily Caffeine Use-5 cups daily Illicit Drug Use - no Patient does not get regular exercise.  Occupation: Apartment Maintenance  Review of Systems  The patient denies anorexia, fever, weight loss, weight gain, vision loss, decreased hearing, hoarseness, chest pain, syncope, dyspnea on exertion, peripheral edema, prolonged cough, headaches, hemoptysis, abdominal pain, melena, hematochezia, severe indigestion/heartburn, hematuria, incontinence, genital sores, muscle weakness, suspicious skin lesions, transient blindness, difficulty walking,  depression, unusual weight change, abnormal bleeding, enlarged lymph nodes, angioedema, breast masses, and testicular masses.    Physical Exam  General:  overweight-appearing.  well-developed and well-nourished.   Head:  Normocephalic and atraumatic without obvious abnormalities. No apparent alopecia or balding. Eyes:  pupils equal and pupils round.   Ears:  R ear normal and L ear normal.   Nose:  no external deformity and no nasal discharge.   Mouth:  pharynx pink and moist and no erythema.   Neck:  supple and full ROM.   Lungs:  normal respiratory effort and no wheezes.   Heart:  normal rate and regular rhythm.     Abdomen:  soft and non-tender.   Msk:  normal ROM, no joint swelling, and no joint warmth.   Pulses:  R and L carotid,radial,femoral,dorsalis pedis and posterior tibial pulses are full and equal bilaterally Extremities:  No clubbing, cyanosis, edema, or deformity noted with normal full range of motion of all joints.   Neurologic:  No cranial nerve deficits noted. Station and gait are normal. Plantar reflexes are down-going bilaterally. DTRs are symmetrical throughout. Sensory, motor and coordinative functions appear intact.   Impression & Recommendations:  Problem # 1:  PREVENTIVE HEALTH CARE (ICD-V70.0) The pt was asked about all immunizations, health maint. services that are appropriate to their age and was given guidance on diet exercize  and weight management  Colonoscopy: abnormal (09/11/2007) Td Booster: Tdap (09/21/2010)   Flu Vax: Fluvax 3+ (08/13/2010)   Chol: 116 (11/09/2010)   HDL: 25.50 (11/09/2010)   LDL: 69 (11/09/2010)   TG: 110.0 (11/09/2010) TSH: 0.86 (11/09/2010)   PSA: 0.83 (10/30/2009) Next Colonoscopy due:: 09/2012 (11/07/2007)  Discussed using sunscreen, use of alcohol, drug use, self testicular exam, routine dental care, routine eye care, routine physical exam, seat belts, multiple vitamins, osteoporosis prevention, adequate calcium intake in diet, and recommendations for immunizations.  Discussed exercise and checking cholesterol.  Discussed gun safety, safe sex, and contraception. Also recommend checking PSA.  Complete Medication List: 1)  Mercaptopurine 50 Mg Tabs (Mercaptopurine) .... Take 1 1/2 tablets by mouth once a day 2)  Tenormin 25 Mg Tabs (Atenolol) .... Once daily 3)  Diovan Hct 160-25 Mg Tabs (Valsartan-hydrochlorothiazide) .... Take one by mouth daily 4)  Lipitor 10 Mg Tabs (Atorvastatin calcium) .... Take one by mouth daily 5)  Adult Aspirin Ec Low Strength 81 Mg Tbec (Aspirin) .... Take one by mouth daily 6)  Tylenol Pm Extra Strength 500-25 Mg Tabs  (Diphenhydramine-apap (sleep)) .... As needed when he cannot sleep 7)  Aleve 220 Mg Tabs (Naproxen sodium) .... As needed for back pain 8)  Zovirax 5 % Oint (Acyclovir) .... Use as directed 9)  Nitrostat 0.4 Mg Subl (Nitroglycerin) .... One sl as needed chest pain up to three then call md 10)  Nexium 40 Mg Cpdr (Esomeprazole magnesium) .... One by mouth daily  Patient Instructions: 1)  Please schedule a follow-up appointment in 6 months. 2)  Hepatic Panel prior to visit, ICD-9:995.20 3)  Lipid Panel prior to visit, ICD-9:272.4   Orders Added: 1)  Est. Patient 40-64 years [phone]

## 2011-02-25 ENCOUNTER — Ambulatory Visit (INDEPENDENT_AMBULATORY_CARE_PROVIDER_SITE_OTHER): Payer: BLUE CROSS/BLUE SHIELD | Admitting: Family Medicine

## 2011-02-25 ENCOUNTER — Encounter: Payer: Self-pay | Admitting: Family Medicine

## 2011-02-25 ENCOUNTER — Encounter: Payer: Self-pay | Admitting: Internal Medicine

## 2011-02-25 DIAGNOSIS — J309 Allergic rhinitis, unspecified: Secondary | ICD-10-CM

## 2011-02-25 MED ORDER — FLUTICASONE PROPIONATE 50 MCG/ACT NA SUSP: 2.0000 | Freq: Every day | NASAL | Status: DC

## 2011-02-25 NOTE — Progress Notes (Signed)
  Subjective:    Patient ID: [name], male    DOB: 17-Sep-1946, 65 y.o.   MRN: [id number]  HPI Patient seen with several day history of upper respiratory congestion. Frequent sneezing. Occasional eye symptoms-itching and watery discharge. No purulent secretions. Denies fever or body aches. Occasional dry cough. Using Allegra with minimal relief. Has used nasal steroids in the past. Symptoms worse when he is outdoors.   Review of Systems  Constitutional: Negative for fever and chills.  HENT: Positive for congestion, rhinorrhea, sneezing and postnasal drip. Negative for sore throat and voice change.   Respiratory: Negative for shortness of breath.        Objective:   Physical Exam  Constitutional: He appears well-developed and well-nourished.  HENT:  Right Ear: External ear normal.  Left Ear: External ear normal.  Mouth/Throat: Oropharynx is clear and moist. No oropharyngeal exudate.       Swelling of nasal mucosa with clear mucus bilaterally  Neck: Neck supple.  Cardiovascular: Normal rate, regular rhythm and normal heart sounds.   No murmur heard. Pulmonary/Chest: Effort normal and breath sounds normal. No respiratory distress. He has no wheezes. He has no rales.  Lymphadenopathy:    He has no cervical adenopathy.          Assessment & Plan:  Allergic rhinitis, seasonal. Add Flonase 2 sprays per nostril once daily to his Allegra

## 2011-02-27 ENCOUNTER — Encounter: Payer: Self-pay | Admitting: Family Medicine

## 2011-03-01 ENCOUNTER — Telehealth: Payer: Self-pay | Admitting: *Deleted

## 2011-03-01 MED ORDER — AZITHROMYCIN 250 MG PO TABS: ORAL_TABLET | ORAL | Status: AC

## 2011-03-01 NOTE — Telephone Encounter (Signed)
Error

## 2011-03-01 NOTE — Telephone Encounter (Signed)
Notified pt. 

## 2011-03-01 NOTE — Telephone Encounter (Signed)
Pt is having chills, ? Fever, non productive cough. Congestion, and has been taking Flonase and Allegra with no relief.  Saw Dr. Elease Hashimoto last week.

## 2011-03-01 NOTE — Telephone Encounter (Signed)
Per jenkins may have a zpack

## 2011-03-03 ENCOUNTER — Other Ambulatory Visit: Payer: Self-pay

## 2011-03-03 MED ORDER — MERCAPTOPURINE 50 MG PO TABS: ORAL_TABLET | ORAL | Status: DC

## 2011-03-03 NOTE — Telephone Encounter (Signed)
Received [facility] refill request and refilled one time until appt scheduled.

## 2011-03-19 ENCOUNTER — Other Ambulatory Visit: Payer: Self-pay | Admitting: Internal Medicine

## 2011-03-23 ENCOUNTER — Encounter: Payer: Self-pay | Admitting: Gastroenterology

## 2011-03-23 ENCOUNTER — Other Ambulatory Visit (INDEPENDENT_AMBULATORY_CARE_PROVIDER_SITE_OTHER): Payer: BLUE CROSS/BLUE SHIELD

## 2011-03-23 ENCOUNTER — Ambulatory Visit (INDEPENDENT_AMBULATORY_CARE_PROVIDER_SITE_OTHER): Payer: BLUE CROSS/BLUE SHIELD | Admitting: Gastroenterology

## 2011-03-23 VITALS — BP 128/78 | HR 60 | Ht 68.0 in | Wt 208.0 lb

## 2011-03-23 DIAGNOSIS — K5130 Ulcerative (chronic) rectosigmoiditis without complications: Secondary | ICD-10-CM

## 2011-03-23 DIAGNOSIS — Z86010 Personal history of colonic polyps: Secondary | ICD-10-CM

## 2011-03-23 LAB — CBC WITH DIFFERENTIAL/PLATELET
Eosinophils Relative: 0 % (ref 0.0–5.0)
HCT: 40.6 % (ref 39.0–52.0)
Hemoglobin: 13.7 g/dL (ref 13.0–17.0)
Lymphs Abs: 1.8 10*3/uL (ref 0.7–4.0)
Monocytes Relative: 10 % (ref 3.0–12.0)
Neutro Abs: 4.5 10*3/uL (ref 1.4–7.7)
WBC: 7 10*3/uL (ref 4.5–10.5)

## 2011-03-23 LAB — COMPREHENSIVE METABOLIC PANEL
ALT: 26 U/L (ref 0–53)
Alkaline Phosphatase: 62 U/L (ref 39–117)
CO2: 32 mEq/L (ref 19–32)
Creatinine, Ser: 1.3 mg/dL (ref 0.4–1.5)
GFR: 61.26 mL/min (ref >60.00)
Glucose, Bld: 89 mg/dL (ref 70–99)
Total Bilirubin: 0.8 mg/dL (ref 0.3–1.2)

## 2011-03-23 MED ORDER — MERCAPTOPURINE 50 MG PO TABS: ORAL_TABLET | ORAL | Status: DC

## 2011-03-23 NOTE — Patient Instructions (Signed)
Go directly to the basement to have your labs drawn.  Your prescription has been sent to your pharmacy.

## 2011-03-23 NOTE — Progress Notes (Signed)
History of Present Illness: This is a 65 year old male here today for followup of ulcerative proctosigmoiditis. He is maintained on 6-MP. He has had no gastrointestinal complaints whatsoever specifically denies any diarrhea or hematochezia. His reflux symptoms are well controlled.  Current Medications, Allergies, Past Medical History, Past Surgical History, Family History and Social History were reviewed in [employer] record.  Physical Exam: General: Well developed , well nourished, no acute distress Head: Normocephalic and atraumatic Eyes:  sclerae anicteric, EOMI Ears: Normal auditory acuity Mouth: No deformity or lesions Lungs: Clear throughout to auscultation Heart: Regular rate and rhythm; no murmurs, rubs or bruits Abdomen: Soft, non tender and non distended. No masses, hepatosplenomegaly or hernias noted. Normal Bowel sounds Musculoskeletal: Symmetrical with no gross deformities  Pulses:  Normal pulses noted Extremities: No clubbing, cyanosis, edema or deformities noted Neurological: Alert oriented x 4, grossly nonfocal Psychological:  Alert and cooperative. Normal mood and affect  Assessment and Recommendations:  1. Ulcerative proctosigmoiditis. We discussed discontinuing 6-mercaptopurine and returning to a 5-ASA. He is reluctant because his symptoms were not well controlled on 5 ASA therapy. He would like to revisit this discussion in 6 months. Refill 6-mercaptopurine 75 mg daily and obtain standard blood work.  Surveillance colonoscopy October 2013.  2. Personal history of adenomatous colon polyps. Surveillance colonoscopy as above.

## 2011-03-29 NOTE — Assessment & Plan Note (Signed)
[facility]                           GASTROENTEROLOGY OFFICE NOTE   NAME:STONEGiuseppe, [name]                   MRN:          [id number]  DATE:06/15/2007                            DOB:          Oct 06, 1946    [name] has had another flare of his ulcerative proctosigmoiditis.  After  being placed on 40 mg of  prednisone a day and continuing on a tapered or  reduced dosage by 10 mg every 10 days,  he has had substantial improvement  in symptoms although he has multiple concerns about his recurrent flares.   Current medications listed on the chart have been reviewed.   MEDICATION ALLERGIES:  PENICILLIN LEADING TO A RASH.   PHYSICAL EXAMINATION:  GENERAL: No distress.  VITAL SIGNS:  Weight 202.8 pounds, blood pressure 118/72, pulse 76 and  regular.  CHEST:  Clear to auscultation bilaterally.  HEART:  Regular rate and rhythm without murmurs.  ABDOMEN:  Soft and nontender with normoactive bowel sounds.   ASSESSMENT AND PLAN:  Ulcerative proctosigmoiditis.  Resolving flare.  Continue prednisone taper as scheduled and listed on the chart.  Continue  Colazal 750 mg three tablets p.o. t.i.d.  Rowasa enemas one q.h.s. for two  weeks and one every other night for two weeks and then discontinue.  Return  office visit in 4-6 weeks.  If he has another  flare, we will need to  strongly consider the use of 6-mercaptopurine.                                   Pricilla Riffle. Dagoberto Ligas., MD, [facility]   MTS/MedQ  DD:  06/16/2006  DT:  06/16/2006  Job #:  [id number]

## 2011-03-29 NOTE — Assessment & Plan Note (Signed)
[facility]                         GASTROENTEROLOGY OFFICE NOTE   NAME:Lowery Lowery, Lowery                   MRN:          [id number]  DATE:08/07/2007                            DOB:          November 26, 1945    This is a return office visit for left-sided ulcerative colitis  controlled on 6-mercaptopurine.  He is due for follow-up colonoscopy due  to history of adenomatous colon polyps.  He is status post right  shoulder surgery by Lowery Lowery. Lowery, M.D. and he has made a full  recovery and has been released from Dr. Susie Lowery care as of yesterday.  He has had no problems with diarrhea, rectal bleeding, or mucous per  rectum.   CURRENT MEDICATIONS:  1. Atenolol 25 mg daily.  2. Aspirin 81 mg daily.  3. 6-mercaptopurine 75 mg daily.  4. Aleve one daily.  5. Diovan HCT 160/25 one daily.  6. Lipitor 10 mg daily.  7. Carafate p.r.n.  8. Tylenol p.r.n.   ALLERGIES:  PENICILLIN leading to rash.   PHYSICAL EXAMINATION:  GENERAL:  No acute distress.  VITAL SIGNS:  Weight 210 pounds, blood pressure 132/84, pulse 64 and  regular.  CHEST:  Clear to auscultation bilaterally.  HEART:  Regular rate and rhythm without murmurs appreciated.  ABDOMEN:  Soft and nontender with normal active bowel sounds.  No  palpable organomegaly, masses, or hernias.   ASSESSMENT:  1. Left sided ulcerative colitis.  Symptoms remain under excellent      control on 6-mercaptopurine 75 mg daily.  Renew prescription for      six months.  Obtain a CMET, CBC, and lipase today.  2. Personal history of adenomatous colon polyps.  Risks, benefits, and      alternatives to colonoscopy with possible biopsy and possible      polypectomy discussed with the patient and he consents to proceed.      This will be scheduled electively.     Lowery Riffle. Lowery Plan, MD, [facility]  Electronically Signed    MTS/MedQ  DD: 08/07/2007  DT: 08/08/2007  Job #: [id number]   cc:   Lowery Dillon, MD

## 2011-03-29 NOTE — Assessment & Plan Note (Signed)
[facility]                         GASTROENTEROLOGY OFFICE NOTE   NAME:STONEBurnette, Sautter                   MRN:          [id number]  DATE:01/02/2008                            DOB:          [birth year]    Mr. Gallicchio returns for followup of left-sided ulcerative colitis. His  colonoscopy performed in October 2008 showed minimal left-sided colitis  and no colon polyps. Biopsies showed mild chronic colitis and no  evidence of dysplasia. He remains on 6-mercaptopurine with no ongoing  gastrointestinal complaints.   CURRENT MEDICATIONS:  1. Atenolol 25 mg daily.  2. Aspirin 81 mg daily.  3. 6-mercaptopurine 75 mg daily.  4. Aleve daily p.r.n.  5. Diovan HCT 160/25 mg 1 daily.  6. Lipitor 10 mg daily.  7. Tylenol p.r.n.  8. Claritin p.r.n.   MEDICATION ALLERGIES:  PENICILLIN leading to a rash.   PHYSICAL EXAMINATION:  No acute distress.  Weight 209 pounds. Blood pressure is 140/80, pulse 68 and regular.  CHEST:  Clear to auscultation bilaterally.  CARDIAC:  Regular rate and rhythm without murmurs appreciated.  ABDOMEN:  Soft and nontender with normal active bowel sounds.   ASSESSMENT/PLAN:  1. Left-sided ulcerative colitis. Symptoms are under excellent control      and his recent colonoscopy shows no significant colitis activity      and no evidence of dysplasia. Obtain a CMET, CBC and lipase today.      Renew 6-mercaptopurine 75 mg p.o. daily or 6 months. Return office      visit 6 months. Surveillance colonoscopy in October 2013.  2. Personal history of adenomatous colon polyps. Surveillance      colonoscopy October 2013.     Pricilla Riffle. Fuller Plan, MD, [facility]  Electronically Signed    MTS/MedQ  DD: 01/02/2008  DT: 01/03/2008  Job #: [id number]

## 2011-04-01 NOTE — Consult Note (Signed)
[place]. [facility]  Patient:    [name], [name]                       MRN: [id number] Proc. Date: 04/07/01 Adm. Date:  [id number] Disc. Date: [id number] Attending:  Bosie Clos CC:         Dr. Johnsie Cancel  Dr. Henrene Pastor   Consultation Report  REASON FOR CONSULTATION: Thank you very much for asking me to see Mr. Bibby, a 65 year old very pleasant gentleman with likely choledocholithiasis, symptomatic with interscapular pain, who now has sludge on gallbladder ultrasound; no evidence of acute cholecystitis, normal WBC, and improving but abnormal liver function tests.  HISTORY OF PRESENT ILLNESS: The patient was initially seen at [facility], where he was initially thought to have cardiac disease.  He has a history of previous MI back in 1993, followed by [facility].  Since then he has been on Tenormin 25 mg q.d., Norvasc 10 mg q.d., Zocor 10 mg q.h.s., and Carafate for GI symptoms.  He had been having indigestion for over a week prior to this problem and on Thursday when he initially had his symptoms he did have a chicken meal along with baked potato with toppings, and the pain started approximately two to four hours after the meal.  After a negative cardiac work-up there and also a Cardiolite study which was normal here, with an ejection fraction of 63%, an ultrasound was done based on the fact that his liver function tests were abnormal.  This demonstrated what was thought to be multiple polyps in the gallbladder along with sludge and possibility of gallstones.  Surgical consultation was obtained along with GI consultation.  My initial thoughts on hearing about the patients story was to get a HIDA scan because it did not sound like acute cholecystitis or biliary duct obstruction; however, after talking with the patient - who had pretty classic postprandial symptoms, with the history of markedly elevated liver function tests, which have started to  normalize, it does sound very convincing to be of gallbladder origin and ductal origin.  His common bile duct is normal; however, it does sound like he has passed a small [place].  His amylase and lipase are normal.  He does not have pancreatitis, and he completely is asymptomatic currently.  PHYSICAL EXAMINATION: On examination he has a normal abdomen, which is soft and nontender.  He just finished eating a meal of clear liquids about 1-1/2 hour ago.  LABORATORY DATA: Todays liver function tests show an SGOT of 106, SGPT 162, alkaline phosphatase 126, total bilirubin which is normal.  RECOMMENDATIONS: Because of the timing of his meals we will not be able to go ahead today with laparoscopic cholecystectomy; however, I did offer this procedure to the patient for tomorrow morning with the possibility of being discharged home tomorrow evening.  The patient, however, because of his improving symptoms and need to take care of some other things would like to be able to go home and take care of certain things at home and then schedule for surgery to be done sometime next week.  Because he does not appear to be acutely symptomatic and he is hemodynamically and otherwise stable, this is a reasonable plan.  I did warn the patient, however, that he is at risk of having further symptoms because of the nature of his disease, likely very small stones, and that he should absolutely remain on a bland diet with no fried foods, high  protein foods, or fatty foods.  The patient after discussing this still wanted to go home, and he is to call my office on Monday or Tuesday to schedule surgery for next week sometime.  I have given him my card along with my nurses name in order to do so.  I have also given him a prescription for Vicodin in case he should have some discomfort; however, I warned him that if his pain is not relieved by one or two tablets he should return to see someone in the emergency room as  soon as possible.DD:  04/07/01 TD:  04/08/01 Job: [id number]

## 2011-04-01 NOTE — Discharge Summary (Signed)
[place]. [facility]  Patient:    [name], [name] Visit Number: [id number] MRN: [id number]          Service Type: DSU Location: [id number] Attending Physician:  Darrelyn Hillock. Dictated by:   Mannie Stabile, P.A. Adm. Date:  [id number] Disc. Date: [id number]   CC:         Ricard Dillon, M.D. [facility]   Discharge Summary  PROCEDURE: 1. A 2-D echocardiogram. 2. Persantine Cardiolite.  HISTORY OF PRESENT ILLNESS:  The patient is a 65 year old with a history of a prior percutaneous intervention, who initially presented to [facility] for the evaluation of sudden onset of back pain, radiating to the chest.  The patient was treated with nitroglycerin and Demerol, and transferred to [facility] for further evaluation.  A chest CT was negative for pulmonary embolus or dissection. The initial cardiac enzymes were negative.  HOSPITAL COURSE:  The patient was transferred to [facility] for further diagnostic evaluation.  Following the transfer, additional cardiac enzymes were negative.  An abdominal ultrasound revealed no focal abnormalities.  Given the elevation of liver enzymes, there was a question of cholecystitis; however, an abdominal ultrasound revealed no focal abnormalities.  A cardiac workup consisted of a 2-D echocardiogram revealing normal LV function with mild right ventricular hypertrophy.  A Persantine Cardiolite revealed no significant perfusion abnormalities, with normal LV function.  The patient was seen in consultation for a surgical evaluation of a possible cholecystitis.  This was not felt to be acute.  The plan is for the patient follow up for further outpatient evaluation, and consideration of a laparoscopic cholecystectomy.  DISCHARGE INSTRUCTIONS:  The patient was instructed to resume previous home medication regimen.  FOLLOWUP:  She is to call and schedule a follow-up appointment with  Dr. Judeth Horn.  DISCHARGE DIAGNOSES: 1. Noncardiac chest pain. 2. Symptomatic gallbladder disease. 3. Coronary artery disease:    a. History of percutaneous intervention. 4. History of hypertension. 5. History of dyslipidemia. DD:  07/02/01 TD:  07/02/01 Job: [id number]

## 2011-04-01 NOTE — Op Note (Signed)
[place]. [facility]  Patient:    [name], [name]                       MRN: [id number] Proc. Date: 04/11/01 Adm. Date:  [id number] Attending:  Darrelyn Hillock.                           Operative Report  PREOPERATIVE DIAGNOSIS:  Acute cholecystitis and elevated liver function test.  POSTOPERATIVE DIAGNOSIS:  Acute cholecystitis and elevated liver function test with normal cholangiogram.  OPERATION:  Laparoscopic cholecystectomy with intraoperative cholangiogram.  SURGEON:  Darrelyn Hillock, M.D.  ANESTHESIA:  General.  DESCRIPTION OF PROCEDURE:  The patient was taken to the operating room, placed in supine position, after adequate anesthesia was induced using endotracheal tube.  The abdomen was prepped and draped in the normal sterile fashion.  Using a transverse infraumbilical incision, I dissected down to the fascia. The fascia was opened vertically.  An 0 Vicryl purse-string suture was placed around the fascial defect.  Hasson trocar was placed in the abdomen and the abdomen was insufflated with carbon dioxide.  Under direct visualization, a 10 mm port was placed in the subxiphoid region and two 5 mm ports were placed in the right abdomen.  Gallbladder was identified and retracted cephalad.  There were a great number of adhesions, both of the omentum and duodenum to the wall of the gallbladder.  These were tediously taken down.  Dissection then continued from the neck of the gallbladder on to a small 1 to 1.5 mm cystic duct.  A clip was placed proximally on this and a ductotomy was made.  A 14 gauge angiocatheter was introduced in the right upper quadrant and a Reddick catheter was passed distally through the cystic duct.  Cholangiogram was performed which showed good filling of the common bile duct, emptying of the duodenum, and both right and left hepatic ducts were visualized.  There were no filling defects and no abnormalities  noted.  The catheter was removed.  The cystic duct was then triply clipped and divided.  The cystic artery was dissected free, triply clipped, and divided. The gallbladder was taken off the gallbladder bed using Bovie electrocautery, placed in an endocatch bag, and removed through the umbilical port.  Adequate hemostasis was insured.  The right upper quadrant was copiously irrigated. All incisions were injected using Marcaine.  The abdomen was allowed to deflate.  The fascial defect was closed with an 0 Vicryl purse-string suture.  Skin incisions were closed with subcuticular 4-0 Monocryl.  Steri-Strips and sterile dressings were applied.  The patient tolerated the procedure well and went to PACU in good condition. DD:  04/11/01 TD:  04/11/01 Job: [id number]

## 2011-04-20 ENCOUNTER — Other Ambulatory Visit: Payer: Self-pay | Admitting: Internal Medicine

## 2011-05-20 ENCOUNTER — Encounter: Payer: Self-pay | Admitting: Internal Medicine

## 2011-05-20 ENCOUNTER — Ambulatory Visit (INDEPENDENT_AMBULATORY_CARE_PROVIDER_SITE_OTHER): Payer: BLUE CROSS/BLUE SHIELD | Admitting: Internal Medicine

## 2011-05-20 VITALS — BP 130/80 | HR 76 | Temp 98.2°F | Resp 16 | Ht 68.0 in | Wt 208.0 lb

## 2011-05-20 DIAGNOSIS — I10 Essential (primary) hypertension: Secondary | ICD-10-CM

## 2011-05-20 DIAGNOSIS — E785 Hyperlipidemia, unspecified: Secondary | ICD-10-CM

## 2011-05-20 DIAGNOSIS — G4730 Sleep apnea, unspecified: Secondary | ICD-10-CM

## 2011-05-20 NOTE — Progress Notes (Signed)
  Subjective:    Patient ID: [name], male    DOB: 06-09-1946, 65 y.o.   MRN: [id number]  HPI   This is a 65 year old white male who presents for followup of hypertension hyperlipidemia with an acute complaint of mid left back pain.  He states that he feels like he may have pulled a muscle in his back he is point tenderness under the left scapula.  He denies any chest pain shortness of breath PND orthopnea or pleuritic pain when he takes a deep breath.  His history of coronary disease but his blood pressures been stable and is compliant with all his medications.  He states that he snores loudly he has not been sleeping well his wife says that he may have apneic events    Review of Systems  Constitutional: Negative for fever and fatigue.  HENT: Negative for hearing loss, congestion, neck pain and postnasal drip.   Eyes: Negative for discharge, redness and visual disturbance.  Respiratory: Negative for cough, shortness of breath and wheezing.   Cardiovascular: Negative for leg swelling.  Gastrointestinal: Negative for abdominal pain, constipation and abdominal distention.  Genitourinary: Negative for urgency and frequency.  Musculoskeletal: Negative for joint swelling and arthralgias.  Skin: Negative for color change and rash.  Neurological: Negative for weakness and light-headedness.  Hematological: Negative for adenopathy.  Psychiatric/Behavioral: Negative for behavioral problems.   Past Medical History  Diagnosis Date  . GERD (gastroesophageal reflux disease)   . Adenomatous polyps 03/2004  . Trigger finger (acquired)   . Coronary atherosclerosis of unspecified type of vessel, native or graft   . Hypertension   . Hyperlipidemia   . 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 Esophagitis 1993   Past Surgical History  Procedure Date  . Cholecystectomy   . Rotator cuff repair 2009  . Lumbar fusion 1983  . Angioplasty 1983    reports that he has never  smoked. He has never used smokeless tobacco. He reports that he does not drink alcohol or use illicit drugs. family history includes COPD in his father; Heart disease in his sister; and Stroke in his mother.  There is no history of Colon cancer. Allergies  Allergen Reactions  . Penicillins     REACTION: rash        Objective:   Physical Exam  Constitutional: He is oriented to person, place, and time. He appears well-developed and well-nourished.  HENT:  Head: Normocephalic and atraumatic.  Eyes: Conjunctivae are normal. Pupils are equal, round, and reactive to light.  Neck: Normal range of motion. Neck supple.  Cardiovascular: Normal rate and regular rhythm.   Pulmonary/Chest: Effort normal and breath sounds normal.  Abdominal: Soft. Bowel sounds are normal.  Musculoskeletal:       Back pain under her left scapula  Neurological: He is alert and oriented to person, place, and time.  Skin: Skin is warm and dry.  Psychiatric: His behavior is normal.          Assessment & Plan:  Possible sleep apnea certainly has the habitus to put him at risk given his history of coronary artery disease he will be referred for sleep study at anytime hospital and CPAP if the study is positive.  His blood pressure is stable his current medications.  He has no current chest pain or evidence of unstable angina.  He has acute low back pain and is given point trigger injection

## 2011-05-20 NOTE — Assessment & Plan Note (Signed)
This has tremendous snoring And is at high risk for sleep apnea

## 2011-05-20 NOTE — Patient Instructions (Signed)
You will be called to get a sleep study done

## 2011-05-23 ENCOUNTER — Telehealth: Payer: Self-pay | Admitting: *Deleted

## 2011-05-23 MED ORDER — CYCLOBENZAPRINE HCL 10 MG PO TABS: 10.0000 mg | ORAL_TABLET | Freq: Three times a day (TID) | ORAL | Status: DC | PRN

## 2011-05-23 MED ORDER — METHYLPREDNISOLONE 4 MG PO KIT: PACK | ORAL | Status: AC

## 2011-05-23 NOTE — Telephone Encounter (Signed)
Medrol does pack and flexerilm 10 mg tid for 10 days

## 2011-05-23 NOTE — Telephone Encounter (Signed)
Pt is still complaining of back pain.  Injection did not help.

## 2011-05-23 NOTE — Telephone Encounter (Signed)
Notified pt. 

## 2011-05-29 ENCOUNTER — Ambulatory Visit: Payer: BLUE CROSS/BLUE SHIELD | Attending: Internal Medicine

## 2011-05-29 DIAGNOSIS — G4733 Obstructive sleep apnea (adult) (pediatric): Secondary | ICD-10-CM | POA: Insufficient documentation

## 2011-06-02 DIAGNOSIS — G4733 Obstructive sleep apnea (adult) (pediatric): Secondary | ICD-10-CM

## 2011-06-02 NOTE — Procedures (Signed)
NAME:  [name], [name]                ACCOUNT NO.:  [id number]  MEDICAL RECORD NO.:  [id number]          PATIENT TYPE:  OUT  LOCATION:  SLEEP CENTER                 [facility]ACILITY[facility]  [facility]  PHYSICIAN:  Rigoberto Noel, MD      DATE OF BIRTH:  1946/04/01  DATE OF STUDY:  05/29/2011                           NOCTURNAL POLYSOMNOGRAM  REFERRING PHYSICIAN:  Arta Silence JENKINS  INDICATION FOR STUDY:  Mr. Joubert is a 65 year old gentleman with loud snoring, excessive daytime somnolence, and nocturia.  At the time of this study, he weighed 208 pounds with a height of 5 feet 8 inches, BMI of 32, neck size of 17.5 inches.  EPWORTH SLEEPINESS SCORE:  7.  MEDICATIONS:  Atenolol, aspirin, Diovan HCT, mercaptopurine, Nexium, Lipitor, and methylprednisone.  This intervention polysomnogram was performed with a sleep technologist in attendance.  EEG, EOG, EMG, EKG, and respiratory parameters were recorded.  Sleep stages, arousals, limb movements, and respiratory data were scored according to criteria laid out by the American Academy of Sleep Medicine.  SLEEP ARCHITECTURE:  Lights out was at 10:12 p.m., lights on was at 5:53 a.m.  CPAP was initiated at 1:48 a.m.  During the diagnostic portion, total sleep time was 156 minutes with a sleep period time of 190 minutes and a sleep efficiency of 72%.  Sleep latency was 25 minutes.  Latency to REM sleep was 87 minutes and wake after sleep onset was 34 minutes. Sleep stages as a percentage of total sleep time was N1 of 80%, N2 of 87%, N3 of 0%, and REM sleep 5% (7 minutes).  Supine sleep accounted for 43 minutes.  During the titration portion, REM sleep accounted for 40 minutes, but no supine REM sleep was noted.  RESPIRATORY DATA:  During the diagnostic portion, there was zero obstructive apnea, zero central apnea, zero mixed apnea, and 47 hypopneas with an apnea/hypopnea index of 18 events per hour, 26 respiratory effort-related arousals (RERAs) were noted with  an RDI of 28 events per hour.  Longest hypopnea was 37 seconds.  Due to this degree of respiratory disturbance, CPAP was initiated at 5 cm and titrated to a final level of 12 cm.  At this level, for 17 minutes of non-REM sleep, no events were noted with a lowest saturation of 90%.  This appears to be the optimal level used during the study.  Note that events were obliterated with CPAP of 5 cm, but CPAP was titrated to 12 cm due to snoring.  Once supine sleep was achieved, CPAP of 8-12 cm was needed.  AROUSAL DATA:  During the diagnostic portion, the arousal index was 30 events per hour, 46 arousals were spontaneous and the rest were associated with respiratory events.  He had few arousals on the higher levels of CPAP.  OXYGEN DATA:  During the diagnostic portion, the desaturation index was 17 events per hour with the lowest desaturation of 86%.  During the titration portion, sats stayed in the low 90s.  CARDIAC DATA:  The low heart rate was 44 beats per minute, the high heart rate recorded was an artifact.  No arrhythmias were noted.  MOVEMENT-PARASOMNIA:  During the diagnostic portion, the  periodic limb movement index was 51 events per hour.  The periodic limb arousal index was, however, only 2 events per hour.  These PLMs were obliterated with CPAP.  DISCUSSION:  He was desensitized with nasal and full-face mask, but seemed to like the latter better.  Supine sleep was needed during the titration portion, however, no REM supine sleep was noted.  IMPRESSIONS-RECOMMENDATIONS:  Impression: 1. Moderate obstructive sleep apnea with predominant hypopneas causing     sleep fragmentation and oxygen desaturation. 2. This was corrected by continuous positive airway pressure of 8-12     cm with a medium-sized full-face mask. 3. Periodic limb movements were noted which disappeared during the     titration portion. 4. No evidence of cardiac arrhythmias or behavioral disturbance during      sleep.  Recommendations: 1. CPAP can be initiated at 12 cm with a medium full-face mask.     Compliance can be monitored at this level of CPAP. 2. Weight loss should be encouraged. 3. He should be advised against driving when sleepy. 4. He should be cautioned against medications with sedative side     effects.     Rigoberto Noel, MD Electronically Signed    RVA/MEDQ  D:  06/02/2011 11:48:22  T:  06/02/2011 23:37:37  Job:  [id number]

## 2011-06-10 ENCOUNTER — Other Ambulatory Visit: Payer: Self-pay | Admitting: Internal Medicine

## 2011-06-17 ENCOUNTER — Telehealth: Payer: Self-pay | Admitting: *Deleted

## 2011-06-17 MED ORDER — AZITHROMYCIN 250 MG PO TABS: ORAL_TABLET | ORAL | Status: AC

## 2011-06-17 NOTE — Telephone Encounter (Signed)
Notified pt. 

## 2011-06-17 NOTE — Telephone Encounter (Signed)
Pt is complaining of neck pain, dizziness and chills with sinus congestion x 2 days.  No fever.

## 2011-06-17 NOTE — Telephone Encounter (Signed)
They give him a Z-Pak and asked him to get Mucinex fast max cough and cold intake 1 Cap 4 times a day until the congestion is relieved

## 2011-07-07 ENCOUNTER — Other Ambulatory Visit: Payer: Self-pay | Admitting: Internal Medicine

## 2011-08-19 ENCOUNTER — Ambulatory Visit (INDEPENDENT_AMBULATORY_CARE_PROVIDER_SITE_OTHER): Payer: BC Managed Care – PPO | Admitting: Internal Medicine

## 2011-08-19 VITALS — BP 110/70 | HR 72 | Temp 98.2°F | Resp 16 | Ht 68.0 in | Wt 208.0 lb

## 2011-08-19 DIAGNOSIS — G4733 Obstructive sleep apnea (adult) (pediatric): Secondary | ICD-10-CM

## 2011-08-19 DIAGNOSIS — M5416 Radiculopathy, lumbar region: Secondary | ICD-10-CM

## 2011-08-19 DIAGNOSIS — IMO0002 Reserved for concepts with insufficient information to code with codable children: Secondary | ICD-10-CM

## 2011-08-19 DIAGNOSIS — I10 Essential (primary) hypertension: Secondary | ICD-10-CM

## 2011-08-19 DIAGNOSIS — Z23 Encounter for immunization: Secondary | ICD-10-CM

## 2011-08-19 MED ORDER — METHYLPREDNISOLONE (PAK) 4 MG PO TABS: 4.0000 mg | ORAL_TABLET | Freq: Every day | ORAL | Status: DC

## 2011-08-19 NOTE — Progress Notes (Signed)
Subjective:    Patient ID: [name], male    DOB: 07/16/1946, 65 y.o.   MRN: [id number]  HPI Leg pain in right leg without swelling l5 -s1 radicular pattern to the right Spaces as do hypertension hyperlipidemia gastroesophageal reflux has a history of chronic low back pain which has persisted the past as a right lumbar radiculopathy. He has sleep apnea and has not been using a machine.   Review of Systems  Constitutional: Negative for fever and fatigue.  HENT: Negative for hearing loss, congestion, neck pain and postnasal drip.   Eyes: Negative for discharge, redness and visual disturbance.  Respiratory: Negative for cough, shortness of breath and wheezing.   Cardiovascular: Negative for leg swelling.  Gastrointestinal: Negative for abdominal pain, constipation and abdominal distention.  Genitourinary: Negative for urgency and frequency.  Musculoskeletal: Negative for joint swelling and arthralgias.  Skin: Negative for color change and rash.  Neurological: Negative for weakness and light-headedness.  Hematological: Negative for adenopathy.  Psychiatric/Behavioral: Negative for behavioral problems.   Past Medical History  Diagnosis Date  . GERD (gastroesophageal reflux disease)   . Adenomatous polyps 03/2004  . Trigger finger (acquired)   . Coronary atherosclerosis of unspecified type of vessel, native or graft   . Hypertension   . Hyperlipidemia   . 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 Esophagitis 1993  . Sleep apnea     History   Social History  . Marital Status: Married    Spouse Name: N/A    Number of Children: 3  . Years of Education: N/A   Occupational History  . apartment maintenance    Social History Main Topics  . Smoking status: Never Smoker   . Smokeless tobacco: Never Used  . Alcohol Use: No  . Drug Use: No  . Sexually Active: Not on file   Other Topics Concern  . Not on file   Social History Narrative   Pt does not get  regular exercise--daily caffeine use-5 cups daily    Past Surgical History  Procedure Date  . Cholecystectomy   . Rotator cuff repair 2009  . Lumbar fusion 1983  . Angioplasty 1983    Family History  Problem Relation Age of Onset  . Stroke Mother   . COPD Father   . Heart disease Sister   . Colon cancer Neg Hx     Allergies  Allergen Reactions  . Penicillins     REACTION: rash    Current Outpatient Prescriptions on File Prior to Visit  Medication Sig Dispense Refill  . aspirin 81 MG tablet Take 81 mg by mouth daily.        Marland Kitchen atenolol (TENORMIN) 25 MG tablet TAKE ONE TABLET BY MOUTH EVERY DAY  90 tablet  3  . DIOVAN HCT 160-25 MG per tablet TAKE ONE TABLET DAILY.  30 each  11  . diphenhydramine-acetaminophen (TYLENOL PM) 25-500 MG TABS Take 1 tablet by mouth at bedtime as needed.        . fluticasone (FLONASE) 50 MCG/ACT nasal spray 2 sprays by Nasal route daily.  16 g  12  . LIPITOR 10 MG tablet TAKE ONE TABLET DAILY.  30 each  6  . naproxen sodium (ANAPROX) 220 MG tablet Take 220 mg by mouth 2 (two) times daily as needed.        . nitroGLYCERIN (NITROSTAT) 0.4 MG SL tablet Place 0.4 mg under the tongue every 5 (five) minutes as needed.        Marland Kitchen  ZOVIRAX 5 % ointment USE AS DIRECTED BY  DOCTOR  15 g  0    BP 110/70  Pulse 72  Temp 98.2 F (36.8 C)  Resp 16  Ht 5' 8"  (1.727 m)  Wt 208 lb (94.348 kg)  BMI 31.63 kg/m2       Objective:   Physical Exam  Nursing note and vitals reviewed. Constitutional: He appears well-developed and well-nourished.  HENT:  Head: Normocephalic and atraumatic.  Eyes: Conjunctivae are normal. Pupils are equal, round, and reactive to light.  Neck: Normal range of motion. Neck supple.  Cardiovascular: Normal rate and regular rhythm.   Pulmonary/Chest: Effort normal and breath sounds normal.  Abdominal: Soft. Bowel sounds are normal.          Assessment & Plan:  Refer for sleep apnea machine and mask Fax copy of the sleep study  to home health care to initiate use of the CPAP machine.   The patient's blood pressure is well-controlled His chronic radicular back pain for which we will give him Naprosyn and muscle relaxants for a short course his pain persists x-rays should be obtained to look at the L5-S1 disc space and consideration for MRI if there is significant disc space loss. A referral l to a neurosurgeon would be the next step

## 2011-08-19 NOTE — Patient Instructions (Signed)
You'll get call from a home health care agency in [place] that will provide the sleep apnea machine and a face mask to be fitted to you they will set the machine at a setting of 12 cm and we will then have some sort of a home of monitoring done several weeks later to make sure that the face mask and machine are working

## 2011-09-06 ENCOUNTER — Encounter: Payer: Self-pay | Admitting: Internal Medicine

## 2011-10-04 ENCOUNTER — Other Ambulatory Visit: Payer: Self-pay | Admitting: Gastroenterology

## 2011-10-17 ENCOUNTER — Telehealth: Payer: Self-pay

## 2011-10-17 MED ORDER — HYDROCODONE-HOMATROPINE 5-1.5 MG/5ML PO SYRP: 5.0000 mL | ORAL_SOLUTION | Freq: Three times a day (TID) | ORAL | Status: AC | PRN

## 2011-10-17 MED ORDER — AZITHROMYCIN 250 MG PO TABS: ORAL_TABLET | ORAL | Status: AC

## 2011-10-17 NOTE — Telephone Encounter (Signed)
Pt states he ahs a severe cough and cold and would like an rx sent to [facility] in [place].  Pls advise.

## 2011-10-17 NOTE — Telephone Encounter (Signed)
Per dr Larrie Kass have z pack and hycodan6 oz 1 tsp every 8 hours prn

## 2011-10-17 NOTE — Telephone Encounter (Signed)
rx called into pharmacy

## 2011-10-20 ENCOUNTER — Encounter: Payer: Self-pay | Admitting: Internal Medicine

## 2011-10-21 ENCOUNTER — Encounter: Payer: Self-pay | Admitting: Internal Medicine

## 2011-10-21 ENCOUNTER — Ambulatory Visit (INDEPENDENT_AMBULATORY_CARE_PROVIDER_SITE_OTHER): Payer: BC Managed Care – PPO | Admitting: Internal Medicine

## 2011-10-21 DIAGNOSIS — J019 Acute sinusitis, unspecified: Secondary | ICD-10-CM

## 2011-10-21 DIAGNOSIS — I10 Essential (primary) hypertension: Secondary | ICD-10-CM

## 2011-10-21 NOTE — Patient Instructions (Signed)
The patient is instructed to continue all medications as prescribed. Schedule followup with check out clerk upon leaving the clinic  

## 2011-10-21 NOTE — Progress Notes (Signed)
Subjective:    Patient ID: [name], male    DOB: 07-Apr-1946, 65 y.o.   MRN: [id number]  HPI Denies fever or chills or sore throat that he has had pressure in his frontal sinus area. Blood pressures are stable He been having increased heartburn but his use of the Nexium has been intermittent. increased chest conjestion   Review of Systems  Constitutional: Negative for fever and fatigue.  HENT: Negative for hearing loss, congestion, neck pain and postnasal drip.   Eyes: Negative for discharge, redness and visual disturbance.  Respiratory: Negative for cough, shortness of breath and wheezing.   Cardiovascular: Negative for leg swelling.  Gastrointestinal: Negative for abdominal pain, constipation and abdominal distention.  Genitourinary: Negative for urgency and frequency.  Musculoskeletal: Negative for joint swelling and arthralgias.  Skin: Negative for color change and rash.  Neurological: Negative for weakness and light-headedness.  Hematological: Negative for adenopathy.  Psychiatric/Behavioral: Negative for behavioral problems.       Past Medical History  Diagnosis Date  . GERD (gastroesophageal reflux disease)   . Adenomatous polyps 03/2004  . Trigger finger (acquired)   . Coronary atherosclerosis of unspecified type of vessel, native or graft   . Hypertension   . Hyperlipidemia   . 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 Esophagitis 1993    History   Social History  . Marital Status: Married    Spouse Name: N/A    Number of Children: 3  . Years of Education: N/A   Occupational History  . apartment maintenance    Social History Main Topics  . Smoking status: Never Smoker   . Smokeless tobacco: Never Used  . Alcohol Use: No  . Drug Use: No  . Sexually Active: Not on file   Other Topics Concern  . Not on file   Social History Narrative   Pt does not get regular exercise--daily caffeine use-5 cups daily    Past Surgical History    Procedure Date  . Cholecystectomy   . Rotator cuff repair 2009  . Lumbar fusion 1983  . Angioplasty 1983    Family History  Problem Relation Age of Onset  . Stroke Mother   . COPD Father   . Heart disease Sister   . Colon cancer Neg Hx     Allergies  Allergen Reactions  . Penicillins     REACTION: rash    Current Outpatient Prescriptions on File Prior to Visit  Medication Sig Dispense Refill  . aspirin 81 MG tablet Take 81 mg by mouth daily.        Marland Kitchen atenolol (TENORMIN) 25 MG tablet TAKE ONE TABLET BY MOUTH EVERY DAY  90 tablet  3  . azithromycin (ZITHROMAX) 250 MG tablet Use as directed.  6 each  0  . DIOVAN HCT 160-25 MG per tablet TAKE ONE TABLET DAILY.  30 each  11  . diphenhydramine-acetaminophen (TYLENOL PM) 25-500 MG TABS Take 1 tablet by mouth at bedtime as needed.        Marland Kitchen esomeprazole (NEXIUM) 40 MG capsule Take 40 mg by mouth daily before breakfast.        . fluticasone (FLONASE) 50 MCG/ACT nasal spray 2 sprays by Nasal route daily.  16 g  12  . HYDROcodone-homatropine (HYCODAN) 5-1.5 MG/5ML syrup Take 5 mLs by mouth every 8 (eight) hours as needed for cough.  120 mL  0  . LIPITOR 10 MG tablet TAKE ONE TABLET DAILY.  30 each  6  . naproxen sodium (  ANAPROX) 220 MG tablet Take 220 mg by mouth 2 (two) times daily as needed.        . nitroGLYCERIN (NITROSTAT) 0.4 MG SL tablet Place 0.4 mg under the tongue every 5 (five) minutes as needed.        Marland Kitchen PURINETHOL 50 MG tablet TAKE 1 AND 1/2 TABLETS DAILY.  45 each  0  . ZOVIRAX 5 % ointment USE AS DIRECTED BY  DOCTOR  15 g  0    BP 130/80  Pulse 72  Temp 98.2 F (36.8 C)  Resp 16  Ht 5' 8"  (1.727 m)  Wt 210 lb (95.255 kg)  BMI 31.93 kg/m2    Objective:   Physical Exam  Nursing note and vitals reviewed. Constitutional: He appears well-developed and well-nourished.  HENT:  Head: Normocephalic and atraumatic.  Eyes: Conjunctivae are normal. Pupils are equal, round, and reactive to light.  Neck: Normal range of  motion. Neck supple.  Cardiovascular: Normal rate and regular rhythm.   Pulmonary/Chest: Effort normal and breath sounds normal.  Abdominal: Soft. Bowel sounds are normal.          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  Recommend that he take the Nexium every day as ordered.

## 2011-11-02 ENCOUNTER — Ambulatory Visit (INDEPENDENT_AMBULATORY_CARE_PROVIDER_SITE_OTHER): Payer: BC Managed Care – PPO | Admitting: Gastroenterology

## 2011-11-02 ENCOUNTER — Other Ambulatory Visit (INDEPENDENT_AMBULATORY_CARE_PROVIDER_SITE_OTHER): Payer: BC Managed Care – PPO

## 2011-11-02 ENCOUNTER — Encounter: Payer: Self-pay | Admitting: Gastroenterology

## 2011-11-02 VITALS — BP 114/72 | HR 76 | Ht 68.0 in | Wt 211.8 lb

## 2011-11-02 DIAGNOSIS — K5130 Ulcerative (chronic) rectosigmoiditis without complications: Secondary | ICD-10-CM

## 2011-11-02 DIAGNOSIS — K219 Gastro-esophageal reflux disease without esophagitis: Secondary | ICD-10-CM

## 2011-11-02 LAB — HEPATIC FUNCTION PANEL
Albumin: 3.5 g/dL (ref 3.5–5.2)
Bilirubin, Direct: 0.1 mg/dL (ref 0.0–0.3)
Total Protein: 6.9 g/dL (ref 6.0–8.3)

## 2011-11-02 LAB — CBC WITH DIFFERENTIAL/PLATELET
Basophils Absolute: 0 10*3/uL (ref 0.0–0.1)
Basophils Relative: 0.6 % (ref 0.0–3.0)
HCT: 40.5 % (ref 39.0–52.0)
Hemoglobin: 13.9 g/dL (ref 13.0–17.0)
Lymphs Abs: 1.3 10*3/uL (ref 0.7–4.0)
Monocytes Relative: 10.6 % (ref 3.0–12.0)
Neutro Abs: 4.4 10*3/uL (ref 1.4–7.7)
RBC: 4.46 Mil/uL (ref 4.22–5.81)
RDW: 16.5 % — ABNORMAL HIGH (ref 11.5–14.6)

## 2011-11-02 LAB — BASIC METABOLIC PANEL
CO2: 32 mEq/L (ref 19–32)
Calcium: 9.1 mg/dL (ref 8.4–10.5)
Creatinine, Ser: 1.3 mg/dL (ref 0.4–1.5)
Glucose, Bld: 94 mg/dL (ref 70–99)

## 2011-11-02 MED ORDER — ESOMEPRAZOLE MAGNESIUM 40 MG PO CPDR: 40.0000 mg | DELAYED_RELEASE_CAPSULE | Freq: Every day | ORAL | Status: DC

## 2011-11-02 MED ORDER — MERCAPTOPURINE 50 MG PO TABS: 75.0000 mg | ORAL_TABLET | Freq: Every day | ORAL | Status: DC

## 2011-11-02 NOTE — Patient Instructions (Addendum)
Your medications have been sent to your pharmacy. Please head down to the basement today for labs. Please follow up in 6 months or sooner if needed.

## 2011-11-02 NOTE — Progress Notes (Signed)
History of Present Illness: This is a 65 year old male returning for followup of ulcerative proctosigmoiditis on 6-MP. His colitis is under excellent control. He has frequent breakthrough reflux symptoms despite taking Nexium on a daily basis. Denies weight loss, abdominal pain, constipation, diarrhea, change in stool caliber, melena, hematochezia, nausea, vomiting, dysphagia, chest pain.  Current Medications, Allergies, Past Medical History, Past Surgical History, Family History and Social History were reviewed in [employer] record.  Physical Exam: General: Well developed , well nourished, no acute distress Head: Normocephalic and atraumatic Eyes:  sclerae anicteric, EOMI Ears: Normal auditory acuity Mouth: No deformity or lesions Lungs: Clear throughout to auscultation Heart: Regular rate and rhythm; no murmurs, rubs or bruits Abdomen: Soft, non tender and non distended. No masses, hepatosplenomegaly or hernias noted. Normal Bowel sounds Pulses:  Normal pulses noted Extremities: No clubbing, cyanosis, edema or deformities noted Neurological: Alert oriented x 4, grossly nonfocal Psychological:  Alert and cooperative. Normal mood and affect  Assessment and Recommendations:  1. Ulcerative proctosigmoiditis. Standard blood work today. Continue 6-MP 75 mg daily.   2. GERD. Continue Nexium 40 mg daily. Intensify antireflux measures. Use TUMS as needed.

## 2011-11-05 ENCOUNTER — Other Ambulatory Visit: Payer: Self-pay | Admitting: Gastroenterology

## 2011-11-14 ENCOUNTER — Encounter: Payer: Self-pay | Admitting: Internal Medicine

## 2012-01-07 ENCOUNTER — Other Ambulatory Visit: Payer: Self-pay | Admitting: Internal Medicine

## 2012-01-13 ENCOUNTER — Other Ambulatory Visit (INDEPENDENT_AMBULATORY_CARE_PROVIDER_SITE_OTHER): Payer: BC Managed Care – PPO

## 2012-01-13 DIAGNOSIS — Z0000 Encounter for general adult medical examination without abnormal findings: Secondary | ICD-10-CM

## 2012-01-13 LAB — CBC WITH DIFFERENTIAL/PLATELET
Eosinophils Absolute: 0 10*3/uL (ref 0.0–0.7)
HCT: 42.4 % (ref 39.0–52.0)
Lymphs Abs: 1.5 10*3/uL (ref 0.7–4.0)
MCHC: 32.9 g/dL (ref 30.0–36.0)
MCV: 91.4 fl (ref 78.0–100.0)
Monocytes Absolute: 0.7 10*3/uL (ref 0.1–1.0)
Neutrophils Relative %: 65 % (ref 43.0–77.0)
Platelets: 203 10*3/uL (ref 150.0–400.0)
RDW: 16.6 % — ABNORMAL HIGH (ref 11.5–14.6)
WBC: 6.2 10*3/uL (ref 4.5–10.5)

## 2012-01-13 LAB — TSH: TSH: 0.89 u[IU]/mL (ref 0.35–5.50)

## 2012-01-13 LAB — POCT URINALYSIS DIPSTICK
Bilirubin, UA: NEGATIVE
Glucose, UA: NEGATIVE
Leukocytes, UA: NEGATIVE
Nitrite, UA: NEGATIVE
Urobilinogen, UA: 0.2
pH, UA: 7

## 2012-01-13 LAB — LIPID PANEL
HDL: 28.5 mg/dL — ABNORMAL LOW (ref >39.00)
Total CHOL/HDL Ratio: 4
Triglycerides: 94 mg/dL (ref 0.0–149.0)

## 2012-01-13 LAB — BASIC METABOLIC PANEL
BUN: 13 mg/dL (ref 6–23)
CO2: 30 mEq/L (ref 19–32)
Chloride: 103 mEq/L (ref 96–112)
Creatinine, Ser: 1.2 mg/dL (ref 0.4–1.5)
Glucose, Bld: 91 mg/dL (ref 70–99)

## 2012-01-13 LAB — HEPATIC FUNCTION PANEL
Bilirubin, Direct: 0.1 mg/dL (ref 0.0–0.3)
Total Bilirubin: 0.4 mg/dL (ref 0.3–1.2)

## 2012-01-13 LAB — PSA: PSA: 0.79 ng/mL (ref 0.10–4.00)

## 2012-01-20 ENCOUNTER — Other Ambulatory Visit: Payer: Self-pay | Admitting: *Deleted

## 2012-01-20 ENCOUNTER — Ambulatory Visit (INDEPENDENT_AMBULATORY_CARE_PROVIDER_SITE_OTHER): Payer: BC Managed Care – PPO | Admitting: Internal Medicine

## 2012-01-20 ENCOUNTER — Encounter: Payer: Self-pay | Admitting: Internal Medicine

## 2012-01-20 DIAGNOSIS — E785 Hyperlipidemia, unspecified: Secondary | ICD-10-CM

## 2012-01-20 DIAGNOSIS — Z0000 Encounter for general adult medical examination without abnormal findings: Secondary | ICD-10-CM

## 2012-01-20 DIAGNOSIS — L570 Actinic keratosis: Secondary | ICD-10-CM

## 2012-01-20 DIAGNOSIS — I10 Essential (primary) hypertension: Secondary | ICD-10-CM

## 2012-01-20 MED ORDER — NITROGLYCERIN 0.4 MG SL SUBL: 0.4000 mg | SUBLINGUAL_TABLET | SUBLINGUAL | Status: DC | PRN

## 2012-01-20 NOTE — Patient Instructions (Signed)
The patient is instructed to continue all medications as prescribed. Schedule followup with check out clerk upon leaving the clinic  

## 2012-01-20 NOTE — Progress Notes (Signed)
Subjective:    Patient ID: [name], male    DOB: 1946/10/20, 66 y.o.   MRN: [id number]  HPI Patient presents for complete physical examination.  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  Patient's blood pressure is moderately well controlled his weight is increased we discussed obesity and the role and worsening of chronic medical problems.  Including his history of coronary artery disease hypertension hyperlipidemia   Review of Systems  Constitutional: Negative for fever and fatigue.  HENT: Negative for hearing loss, congestion, neck pain and postnasal drip.   Eyes: Negative for discharge, redness and visual disturbance.  Respiratory: Negative for cough, shortness of breath and wheezing.   Cardiovascular: Negative for leg swelling.  Gastrointestinal: Negative for abdominal pain, constipation and abdominal distention.  Genitourinary: Negative for urgency and frequency.  Musculoskeletal: Negative for joint swelling and arthralgias.  Skin: Negative for color change and rash.  Neurological: Negative for weakness and light-headedness.  Hematological: Negative for adenopathy.  Psychiatric/Behavioral: Negative for behavioral problems.   Past Medical History  Diagnosis Date  . GERD (gastroesophageal reflux disease)   . Adenomatous polyps 03/2004  . Trigger finger (acquired)   . Coronary atherosclerosis of unspecified type of vessel, native or graft   . Hypertension   . Hyperlipidemia   . 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 Esophagitis 1993  . Sleep apnea     History   Social History  . Marital Status: Married    Spouse Name: N/A    Number of Children: 3  . Years of Education: N/A   Occupational History  . apartment maintenance    Social History Main Topics  . Smoking status: Never Smoker   . Smokeless tobacco: Never Used  . Alcohol Use: No  . Drug Use: No  . Sexually Active:  Not on file   Other Topics Concern  . Not on file   Social History Narrative   Pt does not get regular exercise--daily caffeine use-5 cups daily    Past Surgical History  Procedure Date  . Cholecystectomy   . Rotator cuff repair 2009  . Lumbar fusion 1983  . Angioplasty 1983    Family History  Problem Relation Age of Onset  . Stroke Mother   . COPD Father   . Heart disease Sister   . Colon cancer Neg Hx     Allergies  Allergen Reactions  . Penicillins     REACTION: rash    Current Outpatient Prescriptions on File Prior to Visit  Medication Sig Dispense Refill  . aspirin 81 MG tablet Take 81 mg by mouth daily.        Marland Kitchen atenolol (TENORMIN) 25 MG tablet TAKE ONE TABLET BY MOUTH EVERY DAY  90 tablet  3  . DIOVAN HCT 160-25 MG per tablet TAKE ONE TABLET DAILY.  30 each  11  . diphenhydramine-acetaminophen (TYLENOL PM) 25-500 MG TABS Take 1 tablet by mouth at bedtime as needed.        Marland Kitchen esomeprazole (NEXIUM) 40 MG capsule Take 1 capsule (40 mg total) by mouth daily before breakfast.  30 capsule  11  . fluticasone (FLONASE) 50 MCG/ACT nasal spray 2 sprays by Nasal route daily.  16 g  12  . LIPITOR 10 MG tablet TAKE ONE TABLET DAILY.  30 each  6  . mercaptopurine (PURINETHOL) 50 MG tablet Take 1.5 tablets (75 mg total) by mouth daily. Give on  an empty stomach 1 hour before or 2 hours after meals. Caution: Chemotherapy.  45 tablet  5  . naproxen sodium (ANAPROX) 220 MG tablet Take 220 mg by mouth 2 (two) times daily as needed.        Marland Kitchen ZOVIRAX 5 % ointment USE AS DIRECTED BY  DOCTOR  15 g  0    BP 130/80  Pulse 72  Temp 98.6 F (37 C)  Resp 16  Ht 5' 8"  (1.727 m)  Wt 214 lb (97.07 kg)  BMI 32.54 kg/m2       Objective:   Physical Exam  Constitutional: He is oriented to person, place, and time. He appears well-developed and well-nourished.  HENT:  Head: Normocephalic and atraumatic.  Eyes: Conjunctivae are normal. Pupils are equal, round, and reactive to light.    Neck: Normal range of motion. Neck supple.  Cardiovascular: Normal rate and regular rhythm.   Pulmonary/Chest: Effort normal and breath sounds normal.  Abdominal: Soft. Bowel sounds are normal.  Genitourinary: Rectum normal and prostate normal.  Musculoskeletal: He exhibits edema.  Neurological: He is alert and oriented to person, place, and time.  Skin: Skin is warm and dry. Rash noted.       Lesion on right buttock  Psychiatric: He has a normal mood and affect. His behavior is normal.          Assessment & Plan:                Patient presents for yearly preventative medicine examination.   all immunizations and health maintenance protocols were reviewed with the patient and they are up to date with these protocols.   screening laboratory values were reviewed with the patient including screening of hyperlipidemia PSA renal function and hepatic function.   There medications past medical history social history problem list and allergies were reviewed in detail.   Goals were established with regard to weight loss exercise diet in compliance with medications  Insulin lipids are at goal.  Patient's hypertension is the goal.  The patient has actinic keratosis and will be treated with cryotherapy today on the right ear superior pinna.     Informed consent was obtained in the lesion on the right ear was treated for 60 seconds of liquid nitrogen application the patient tolerated the procedure well as procedural care was discussed with the patient and instructions should the lesion reappears contact our office immediately

## 2012-02-27 ENCOUNTER — Telehealth: Payer: Self-pay | Admitting: *Deleted

## 2012-02-27 NOTE — Telephone Encounter (Signed)
Notified pt to try OTC meds for allergies.

## 2012-02-27 NOTE — Telephone Encounter (Signed)
Try some claritin, zyrtec , or allegra otc until on of then works-- not much is going to help during this pollen period- hopefully the rain will help

## 2012-02-27 NOTE — Telephone Encounter (Signed)
Pt is having allergy symptoms such as sneezing, runny nose, scratchy throat, and is using Flonase with no real relief.  Would like a different RX.

## 2012-04-05 ENCOUNTER — Ambulatory Visit (INDEPENDENT_AMBULATORY_CARE_PROVIDER_SITE_OTHER): Payer: BC Managed Care – PPO | Admitting: Internal Medicine

## 2012-04-05 ENCOUNTER — Encounter: Payer: Self-pay | Admitting: Internal Medicine

## 2012-04-05 VITALS — BP 104/62 | Temp 97.7°F | Wt 211.0 lb

## 2012-04-05 DIAGNOSIS — I10 Essential (primary) hypertension: Secondary | ICD-10-CM

## 2012-04-05 DIAGNOSIS — J209 Acute bronchitis, unspecified: Secondary | ICD-10-CM

## 2012-04-05 DIAGNOSIS — I2510 Atherosclerotic heart disease of native coronary artery without angina pectoris: Secondary | ICD-10-CM

## 2012-04-05 MED ORDER — HYDROCODONE-HOMATROPINE 5-1.5 MG/5ML PO SYRP: 5.0000 mL | ORAL_SOLUTION | Freq: Four times a day (QID) | ORAL | Status: AC | PRN

## 2012-04-05 NOTE — Patient Instructions (Signed)
Celebrex 2 daily Hydromet as prescribed   Use saline irrigation, warm  moist compresses.  Call if there is no improvement in 5 to 7 days, or sooner if you develop increasing pain, fever, or any new symptoms.

## 2012-04-05 NOTE — Progress Notes (Signed)
  Subjective:    Patient ID: [name], male    DOB: April 21, 1946, 66 y.o.   MRN: [id number]  HPI  66 year old patient who presents with a two-day history of cough sore throat and malaise. No significant sputum production chest pain or shortness of breath. He does have treated hypertension and coronary artery disease    Review of Systems  Constitutional: Positive for activity change, appetite change and fatigue. Negative for fever and chills.  HENT: Positive for congestion, sore throat and sinus pressure. Negative for hearing loss, ear pain, trouble swallowing, neck stiffness, dental problem, voice change and tinnitus.   Eyes: Negative for pain, discharge and visual disturbance.  Respiratory: Positive for cough. Negative for chest tightness, wheezing and stridor.   Cardiovascular: Negative for chest pain, palpitations and leg swelling.  Gastrointestinal: Negative for nausea, vomiting, abdominal pain, diarrhea, constipation, blood in stool and abdominal distention.  Genitourinary: Negative for urgency, hematuria, flank pain, discharge, difficulty urinating and genital sores.  Musculoskeletal: Negative for myalgias, back pain, joint swelling, arthralgias and gait problem.  Skin: Negative for rash.  Neurological: Negative for dizziness, syncope, speech difficulty, weakness, numbness and headaches.  Hematological: Negative for adenopathy. Does not bruise/bleed easily.  Psychiatric/Behavioral: Negative for behavioral problems and dysphoric mood. The patient is not nervous/anxious.        Objective:   Physical Exam  Constitutional: He is oriented to person, place, and time. He appears well-developed.  HENT:  Head: Normocephalic.  Right Ear: External ear normal.  Left Ear: External ear normal.       Oropharynx minimally injected  Eyes: Conjunctivae and EOM are normal.  Neck: Normal range of motion.  Cardiovascular: Normal rate and normal heart sounds.   Pulmonary/Chest: Breath sounds  normal.  Abdominal: Bowel sounds are normal.  Musculoskeletal: Normal range of motion. He exhibits no edema and no tenderness.  Neurological: He is alert and oriented to person, place, and time.  Psychiatric: He has a normal mood and affect. His behavior is normal.          Assessment & Plan:   Viral URI with cough. Will treat symptomatically

## 2012-04-11 ENCOUNTER — Other Ambulatory Visit: Payer: Self-pay | Admitting: Internal Medicine

## 2012-04-20 ENCOUNTER — Ambulatory Visit (INDEPENDENT_AMBULATORY_CARE_PROVIDER_SITE_OTHER): Payer: Medicare Other | Admitting: Internal Medicine

## 2012-04-20 ENCOUNTER — Encounter: Payer: Self-pay | Admitting: Internal Medicine

## 2012-04-20 VITALS — BP 120/78 | HR 68 | Temp 98.2°F | Resp 16 | Ht 68.0 in | Wt 208.0 lb

## 2012-04-20 DIAGNOSIS — R0789 Other chest pain: Secondary | ICD-10-CM

## 2012-04-20 DIAGNOSIS — I10 Essential (primary) hypertension: Secondary | ICD-10-CM

## 2012-04-20 DIAGNOSIS — K219 Gastro-esophageal reflux disease without esophagitis: Secondary | ICD-10-CM

## 2012-04-20 MED ORDER — DICYCLOMINE HCL 10 MG PO CAPS: 10.0000 mg | ORAL_CAPSULE | Freq: Three times a day (TID) | ORAL | Status: DC

## 2012-04-20 NOTE — Progress Notes (Signed)
Subjective:    Patient ID: [name], male    DOB: [birth year], 66 y.o.   MRN: [id number]  HPI  Follow up HTN and GERD Stable GERD but has episodes Has not lost weight as planned Wearing the CPAP. When we walks after eating he feels a fullness in his chest He had a stress test in 2011 for similar symptoms and the test was negative Hx of IBS     Review of Systems  Constitutional: Negative for fever and fatigue.  HENT: Negative for hearing loss, congestion, neck pain and postnasal drip.   Eyes: Negative for discharge, redness and visual disturbance.  Respiratory: Negative for cough, shortness of breath and wheezing.   Cardiovascular: Negative for leg swelling.  Gastrointestinal: Negative for abdominal pain, constipation and abdominal distention.  Genitourinary: Negative for urgency and frequency.  Musculoskeletal: Negative for joint swelling and arthralgias.  Skin: Negative for color change and rash.  Neurological: Negative for weakness and light-headedness.  Hematological: Negative for adenopathy.  Psychiatric/Behavioral: Negative for behavioral problems.   Past Medical History  Diagnosis Date  . GERD (gastroesophageal reflux disease)   . Adenomatous polyps 03/2004  . Trigger finger (acquired)   . Coronary atherosclerosis of unspecified type of vessel, native or graft   . Hypertension   . Hyperlipidemia   . 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 Esophagitis 1993  . Sleep apnea     History   Social History  . Marital Status: Married    Spouse Name: N/A    Number of Children: 3  . Years of Education: N/A   Occupational History  . apartment maintenance    Social History Main Topics  . Smoking status: Never Smoker   . Smokeless tobacco: Never Used  . Alcohol Use: No  . Drug Use: No  . Sexually Active: Not on file   Other Topics Concern  . Not on file   Social History Narrative   Pt does not get regular exercise--daily caffeine use-5 cups  daily    Past Surgical History  Procedure Date  . Cholecystectomy   . Rotator cuff repair 2009  . Lumbar fusion 1983  . Angioplasty 1983    Family History  Problem Relation Age of Onset  . Stroke Mother   . COPD Father   . Heart disease Sister   . Colon cancer Neg Hx     Allergies  Allergen Reactions  . Penicillins     REACTION: rash    Current Outpatient Prescriptions on File Prior to Visit  Medication Sig Dispense Refill  . aspirin 81 MG tablet Take 81 mg by mouth daily.        Marland Kitchen atenolol (TENORMIN) 25 MG tablet TAKE ONE TABLET BY MOUTH EVERY DAY  90 tablet  3  . DIOVAN HCT 160-25 MG per tablet TAKE ONE TABLET DAILY.  30 each  11  . diphenhydramine-acetaminophen (TYLENOL PM) 25-500 MG TABS Take 1 tablet by mouth at bedtime as needed.        Marland Kitchen esomeprazole (NEXIUM) 40 MG capsule Take 1 capsule (40 mg total) by mouth daily before breakfast.  30 capsule  11  . fluticasone (FLONASE) 50 MCG/ACT nasal spray Place 2 sprays into the nose daily.      Marland Kitchen LIPITOR 10 MG tablet TAKE ONE TABLET DAILY.  30 each  6  . mercaptopurine (PURINETHOL) 50 MG tablet Take 1.5 tablets (75 mg total) by mouth daily. Give on an empty stomach 1 hour before or 2 hours after  meals. Caution: Chemotherapy.  45 tablet  5  . naproxen sodium (ANAPROX) 220 MG tablet Take 220 mg by mouth 2 (two) times daily as needed.        . nitroGLYCERIN (NITROSTAT) 0.4 MG SL tablet Place 1 tablet (0.4 mg total) under the tongue every 5 (five) minutes as needed.  30 tablet  3  . ZOVIRAX 5 % ointment USE AS DIRECTED BY  DOCTOR  15 g  0    BP 120/78  Pulse 68  Temp 98.2 F (36.8 C)  Resp 16  Ht 5' 8"  (1.727 m)  Wt 208 lb (94.348 kg)  BMI 31.63 kg/m2         Objective:   Physical Exam  Nursing note and vitals reviewed. Constitutional: He appears well-developed and well-nourished.  HENT:  Head: Normocephalic and atraumatic.  Eyes: Conjunctivae are normal. Pupils are equal, round, and reactive to light.  Neck:  Normal range of motion. Neck supple.  Cardiovascular: Normal rate and regular rhythm.   Pulmonary/Chest: Effort normal and breath sounds normal.  Abdominal: Soft. Bowel sounds are normal.          Assessment & Plan:  He says a history of reflux and perhaps irritable bowel syndrome he is also on mercaptopurine for colitis.  I believe that a trial of Bentyl may help to differentiate his chest discomfort it may well be esophageal in etiology if the Bentyl alleviates the discomfort we are comfortable with not repeating his cardiovascular tests that were negative in 2011. His blood pressure continues to be stable the combination of the of an and Tenormin.

## 2012-04-20 NOTE — Patient Instructions (Signed)
The patient is instructed to continue all medications as prescribed. Schedule followup with check out clerk upon leaving the clinic  

## 2012-04-27 ENCOUNTER — Other Ambulatory Visit: Payer: Self-pay | Admitting: Family Medicine

## 2012-05-04 ENCOUNTER — Other Ambulatory Visit: Payer: Self-pay | Admitting: Gastroenterology

## 2012-05-07 ENCOUNTER — Other Ambulatory Visit: Payer: Self-pay | Admitting: Gastroenterology

## 2012-05-07 MED ORDER — MERCAPTOPURINE 50 MG PO TABS: 75.0000 mg | ORAL_TABLET | Freq: Every day | ORAL | Status: DC

## 2012-05-07 NOTE — Telephone Encounter (Signed)
Left a message for patient telling him that one refill has been sent to his pharmacy but he needs to keep his appt in July for any further refills.

## 2012-06-02 ENCOUNTER — Other Ambulatory Visit: Payer: Self-pay | Admitting: Gastroenterology

## 2012-06-04 ENCOUNTER — Ambulatory Visit (INDEPENDENT_AMBULATORY_CARE_PROVIDER_SITE_OTHER)
Admission: RE | Admit: 2012-06-04 | Discharge: 2012-06-04 | Disposition: A | Discharge status: Home / Self Care | Payer: Medicare Other | Source: Ambulatory Visit | Attending: Family | Admitting: Family

## 2012-06-04 ENCOUNTER — Encounter: Payer: Self-pay | Admitting: Family

## 2012-06-04 ENCOUNTER — Ambulatory Visit (INDEPENDENT_AMBULATORY_CARE_PROVIDER_SITE_OTHER): Payer: Medicare Other | Admitting: Family

## 2012-06-04 ENCOUNTER — Telehealth: Payer: Self-pay | Admitting: Internal Medicine

## 2012-06-04 VITALS — BP 110/76 | HR 84 | Wt 210.0 lb

## 2012-06-04 DIAGNOSIS — M79609 Pain in unspecified limb: Secondary | ICD-10-CM

## 2012-06-04 DIAGNOSIS — M79672 Pain in left foot: Secondary | ICD-10-CM

## 2012-06-04 MED ORDER — MELOXICAM 15 MG PO TABS: 15.0000 mg | ORAL_TABLET | Freq: Every day | ORAL | Status: DC

## 2012-06-04 NOTE — Telephone Encounter (Signed)
Dr Arnoldo Morale agreed

## 2012-06-04 NOTE — Telephone Encounter (Signed)
Caller: [name]/Patient; PCP: Benay Pillow; CB#: [phone]; ; ; Call regarding left  Foot pain, onset 07/15 in arch of foot, on 07/21 it began hurting on top of foot.  Pain is currently 8 out of 10 when stepping on foot, when resting it "doesn't bother me."  Aleve taken with minimal relief.  Emergent sx negative.  Care advice per "Foot, Non-Injury" with appt advised within 24h due to "sensations of shooting discomfort that occurs with movement, stops with rest, not previously evaluated."  No available appts with Dr. Arnoldo Morale.  Appt scheduled 07/22 at 41 with Roxy Cedar FNP.  Call back parameters reviewed.

## 2012-06-04 NOTE — Telephone Encounter (Signed)
KEEP APPT FOR ANY FURTHER REFILLS!!

## 2012-06-04 NOTE — Progress Notes (Signed)
Subjective:    Patient ID: [name], male    DOB: 03-26-1946, 66 y.o.   MRN: [id number]  HPI 66 year old white male, nonsmoker, patient of Dr. Arnoldo Morale is in today with complaints of left foot pain, that is worse with walking. Better when he sitting. Recent pain 8/10. His intake and believes that helps his symptoms, but pain returns. He's had similar symptoms in the past but is unsure of what his condition was. Denies any injury.   Review of Systems  Constitutional: Negative.   Respiratory: Negative.   Cardiovascular: Negative.   Musculoskeletal: Positive for gait problem. Negative for back pain and joint swelling.       Left foot pain  Skin: Negative.   Hematological: Negative.   Psychiatric/Behavioral: Negative.    Past Medical History  Diagnosis Date  . GERD (gastroesophageal reflux disease)   . Adenomatous polyps 03/2004  . Trigger finger (acquired)   . Coronary atherosclerosis of unspecified type of vessel, native or graft   . Hypertension   . Hyperlipidemia   . 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 Esophagitis 1993  . Sleep apnea     History   Social History  . Marital Status: Married    Spouse Name: N/A    Number of Children: 3  . Years of Education: N/A   Occupational History  . apartment maintenance    Social History Main Topics  . Smoking status: Never Smoker   . Smokeless tobacco: Never Used  . Alcohol Use: No  . Drug Use: No  . Sexually Active: Not on file   Other Topics Concern  . Not on file   Social History Narrative   Pt does not get regular exercise--daily caffeine use-5 cups daily    Past Surgical History  Procedure Date  . Cholecystectomy   . Rotator cuff repair 2009  . Lumbar fusion 1983  . Angioplasty 1983    Family History  Problem Relation Age of Onset  . Stroke Mother   . COPD Father   . Heart disease Sister   . Colon cancer Neg Hx     Allergies  Allergen Reactions  . Penicillins     REACTION: rash      Current Outpatient Prescriptions on File Prior to Visit  Medication Sig Dispense Refill  . aspirin 81 MG tablet Take 81 mg by mouth daily.        Marland Kitchen atenolol (TENORMIN) 25 MG tablet TAKE ONE TABLET BY MOUTH EVERY DAY  90 tablet  3  . dicyclomine (BENTYL) 10 MG capsule Take 1 capsule (10 mg total) by mouth 4 (four) times daily -  before meals and at bedtime.  90 capsule  11  . DIOVAN HCT 160-25 MG per tablet TAKE ONE TABLET DAILY.  30 each  11  . diphenhydramine-acetaminophen (TYLENOL PM) 25-500 MG TABS Take 1 tablet by mouth at bedtime as needed.        Marland Kitchen esomeprazole (NEXIUM) 40 MG capsule Take 1 capsule (40 mg total) by mouth daily before breakfast.  30 capsule  11  . FLONASE 50 MCG/ACT nasal spray INHALE 1 SPRAY INTO EACH NOSTRIL ONCE DAILY.  16 g  0  . LIPITOR 10 MG tablet TAKE ONE TABLET DAILY.  30 each  6  . mercaptopurine (PURINETHOL) 50 MG tablet TAKE 1 AND 1/2 TABLETS DAILY.GIVE ON AN EMPTY STOMACH 1 HOUR BEFORE OR 2 HOURS AFTER MEAL.  45 tablet  0  . naproxen sodium (ANAPROX) 220 MG tablet Take 220  mg by mouth 2 (two) times daily as needed.        . nitroGLYCERIN (NITROSTAT) 0.4 MG SL tablet Place 1 tablet (0.4 mg total) under the tongue every 5 (five) minutes as needed.  30 tablet  3  . ZOVIRAX 5 % ointment USE AS DIRECTED BY  DOCTOR  15 g  0    BP 110/76  Pulse 84  Wt 210 lb (95.255 kg)  SpO2 98%chart    Objective:   Physical Exam  Constitutional: He is oriented to person, place, and time. He appears well-developed and well-nourished.  Cardiovascular: Normal rate, regular rhythm and normal heart sounds.   Pulmonary/Chest: Effort normal.  Musculoskeletal:       Pedal pulses 2 out of 2. Tenderness to palpation of the heel into the arch of the left foot. No redness or swelling.  Neurological: He is alert and oriented to person, place, and time.  Skin: Skin is warm and dry.  Psychiatric: He has a normal mood and affect.          Assessment & Plan:  Assessment: Left  foot pain, probable calcaneus spur  Plan: X-ray of the left foot will notify patient pending results. Mobic 15 mg once daily with food. Patient to call the office if symptoms worsen or persist. We'll consider a cortisone injection if symptoms persist.

## 2012-06-06 ENCOUNTER — Telehealth: Payer: Self-pay | Admitting: Internal Medicine

## 2012-06-06 NOTE — Telephone Encounter (Signed)
Pt called and said that he was returning call from [place]. Pls call.

## 2012-06-06 NOTE — Telephone Encounter (Signed)
See lab note.  

## 2012-06-11 ENCOUNTER — Encounter: Payer: Self-pay | Admitting: Gastroenterology

## 2012-06-11 ENCOUNTER — Ambulatory Visit (INDEPENDENT_AMBULATORY_CARE_PROVIDER_SITE_OTHER): Payer: Medicare Other | Admitting: Gastroenterology

## 2012-06-11 ENCOUNTER — Other Ambulatory Visit (INDEPENDENT_AMBULATORY_CARE_PROVIDER_SITE_OTHER): Payer: Medicare Other

## 2012-06-11 VITALS — BP 132/80 | HR 60 | Ht 68.0 in | Wt 211.8 lb

## 2012-06-11 DIAGNOSIS — Z79899 Other long term (current) drug therapy: Secondary | ICD-10-CM

## 2012-06-11 DIAGNOSIS — R072 Precordial pain: Secondary | ICD-10-CM

## 2012-06-11 DIAGNOSIS — K5130 Ulcerative (chronic) rectosigmoiditis without complications: Secondary | ICD-10-CM

## 2012-06-11 DIAGNOSIS — K219 Gastro-esophageal reflux disease without esophagitis: Secondary | ICD-10-CM

## 2012-06-11 LAB — CBC WITH DIFFERENTIAL/PLATELET
Basophils Absolute: 0 10*3/uL (ref 0.0–0.1)
Eosinophils Absolute: 0 10*3/uL (ref 0.0–0.7)
HCT: 41.2 % (ref 39.0–52.0)
Lymphs Abs: 1.7 10*3/uL (ref 0.7–4.0)
MCV: 90.6 fl (ref 78.0–100.0)
Monocytes Absolute: 0.8 10*3/uL (ref 0.1–1.0)
Platelets: 195 10*3/uL (ref 150.0–400.0)
RDW: 17.4 % — ABNORMAL HIGH (ref 11.5–14.6)

## 2012-06-11 LAB — BASIC METABOLIC PANEL
Chloride: 100 mEq/L (ref 96–112)
GFR: 56.36 mL/min — ABNORMAL LOW (ref >60.00)
Glucose, Bld: 107 mg/dL — ABNORMAL HIGH (ref 70–99)
Potassium: 4.3 mEq/L (ref 3.5–5.1)
Sodium: 138 mEq/L (ref 135–145)

## 2012-06-11 LAB — HEPATIC FUNCTION PANEL
AST: 22 U/L (ref 0–37)
Alkaline Phosphatase: 61 U/L (ref 39–117)
Total Bilirubin: 0.7 mg/dL (ref 0.3–1.2)

## 2012-06-11 LAB — TSH: TSH: 0.98 u[IU]/mL (ref 0.35–5.50)

## 2012-06-11 MED ORDER — MERCAPTOPURINE 50 MG PO TABS: ORAL_TABLET | ORAL | Status: DC

## 2012-06-11 MED ORDER — ESOMEPRAZOLE MAGNESIUM 40 MG PO CPDR: 40.0000 mg | DELAYED_RELEASE_CAPSULE | Freq: Two times a day (BID) | ORAL | Status: DC

## 2012-06-11 NOTE — Progress Notes (Signed)
History of Present Illness: This is a 66 year old male who returns for followup complaining of intermittent chest discomfort in his mid sternal area. Occasionally the symptoms are brought on by meals and occasionally by exercise. His chest pain has been  present for a long time and has not substantially changed. He was given a trial of Bentyl by Dr. Arnoldo Morale and this did not help his pain so he discontinued it. His colitis is well controlled on 6-MP. Denies weight loss, abdominal pain, constipation, diarrhea, change in stool caliber, melena, hematochezia, nausea, vomiting, dysphagia.  Current Medications, Allergies, Past Medical History, Past Surgical History, Family History and Social History were reviewed in [employer] record.  Physical Exam: General: Well developed , well nourished, no acute distress Head: Normocephalic and atraumatic Eyes:  sclerae anicteric, EOMI Ears: Normal auditory acuity Mouth: No deformity or lesions Lungs: Clear throughout to auscultation Heart: Regular rate and rhythm; no murmurs, rubs or bruits Abdomen: Soft, non tender and non distended. No masses, hepatosplenomegaly or hernias noted. Normal Bowel sounds Musculoskeletal: Symmetrical with no gross deformities  Pulses:  Normal pulses noted Extremities: No clubbing, cyanosis, edema or deformities noted Neurological: Alert oriented x 4, grossly nonfocal Psychological:  Alert and cooperative. Normal mood and affect  Assessment and Recommendations:  1. Ulcerative proctosigmoiditis under excellent control on current therapy. Standard blood work today. Continue 6-MP 75 mg daily. Followup visit in 6 months.  2. GERD. Increase Nexium to 40 mg bid. Intensify antireflux measures. Use TUMS as needed.  3. Atypical chest pain. Possible GERD. Schedule EGD. If symptoms do not respond to Nexium bid and no other GI causes for chest pain are noted at upper endoscopy he will return to Dr. Arnoldo Morale for further  evaluation. The risks, benefits, and alternatives to endoscopy with possible biopsy and possible dilation were discussed with the patient and they consent to proceed.

## 2012-06-11 NOTE — Patient Instructions (Signed)
Your physician has requested that you go to the basement for the following lab work before leaving today:Bmet, Hepatic panel, Lipase, CBC, TSH.  You have been scheduled for an endoscopy with propofol. Please follow written instructions given to you at your visit today. If you use inhalers (even only as needed), please bring them with you on the day of your procedure.  Increase taking your Nexium to one tablet by mouth twice daily. A new prescription has been sent to your pharmacy.  We have sent the following medications to your pharmacy for you to pick up at your convenience: 29m  cc: JBenay Pillow MD

## 2012-06-20 ENCOUNTER — Encounter: Payer: Self-pay | Admitting: Internal Medicine

## 2012-06-20 ENCOUNTER — Ambulatory Visit (INDEPENDENT_AMBULATORY_CARE_PROVIDER_SITE_OTHER): Payer: Medicare Other | Admitting: Internal Medicine

## 2012-06-20 VITALS — BP 134/80 | HR 72 | Temp 98.2°F | Resp 16 | Ht 68.0 in | Wt 210.0 lb

## 2012-06-20 DIAGNOSIS — T887XXA Unspecified adverse effect of drug or medicament, initial encounter: Secondary | ICD-10-CM

## 2012-06-20 DIAGNOSIS — N289 Disorder of kidney and ureter, unspecified: Secondary | ICD-10-CM

## 2012-06-20 DIAGNOSIS — K219 Gastro-esophageal reflux disease without esophagitis: Secondary | ICD-10-CM

## 2012-06-20 NOTE — Patient Instructions (Addendum)
The patient is instructed to continue all medications as prescribed. Schedule followup with check out clerk upon leaving the clinic   Stop the meloxicam and the naprosyn Use tylenol only

## 2012-06-20 NOTE — Progress Notes (Signed)
  Subjective:    Patient ID: [name], male    DOB: 07-Jul-1946, 66 y.o.   MRN: [id number]  HPI Patient was given nonsteroidals because of a spur in his heel he is also on medication for ulcerative colitis I believe the medications he aided the increased renal insufficiency and hopefully will be reversible with discontinuation of the nonsteroidal for his pain in his plantar fascia we will give them a steroid injection at the heel spur. I recommended he discontinue the Naprosyn and not take any nonsteroidal along with the mercaptopurine. Patient's also been having increased GERD which may be related to the increased nonsteroidal use.  He has been taking meloxicam along with the Naprosyn    Review of Systems  Constitutional: Negative for fever and fatigue.  HENT: Negative for hearing loss, congestion, neck pain and postnasal drip.   Eyes: Negative for discharge, redness and visual disturbance.  Respiratory: Negative for cough, shortness of breath and wheezing.   Cardiovascular: Negative for leg swelling.  Gastrointestinal: Negative for abdominal pain, constipation and abdominal distention.  Genitourinary: Negative for urgency and frequency.  Musculoskeletal: Negative for joint swelling and arthralgias.  Skin: Negative for color change and rash.  Neurological: Negative for weakness and light-headedness.  Hematological: Negative for adenopathy.  Psychiatric/Behavioral: Negative for behavioral problems.       Objective:   Physical Exam  Nursing note and vitals reviewed. Constitutional: He appears well-developed and well-nourished.  HENT:  Head: Normocephalic and atraumatic.  Eyes: Conjunctivae are normal. Pupils are equal, round, and reactive to light.  Neck: Normal range of motion. Neck supple.  Cardiovascular: Normal rate and regular rhythm.   Pulmonary/Chest: Effort normal and breath sounds normal.  Abdominal: Soft. Bowel sounds are normal.          Assessment &  Plan:  I believe his crease GERD symptoms may be due to the use of the meloxicam and the Naprosyn.  Will discontinue Naprosyn I agree with the increase the Nexium to 40 mg twice a day until his endoscopy if the endoscopy does show erosive esophagitis that may be related to nonsteroidal use and will discontinue all nonsteroidals and use alternatives for inflammatory pain control.  His blood pressure stable on his Diovan Renal insufficiency is most probably due to a nonsteroidal use

## 2012-06-22 ENCOUNTER — Encounter: Payer: Self-pay | Admitting: Gastroenterology

## 2012-06-22 ENCOUNTER — Ambulatory Visit (AMBULATORY_SURGERY_CENTER): Payer: Medicare Other | Admitting: Gastroenterology

## 2012-06-22 VITALS — BP 145/88 | HR 70 | Temp 96.1°F | Resp 20 | Ht 68.0 in | Wt 211.0 lb

## 2012-06-22 DIAGNOSIS — R072 Precordial pain: Secondary | ICD-10-CM

## 2012-06-22 DIAGNOSIS — K209 Esophagitis, unspecified: Secondary | ICD-10-CM

## 2012-06-22 DIAGNOSIS — K219 Gastro-esophageal reflux disease without esophagitis: Secondary | ICD-10-CM

## 2012-06-22 DIAGNOSIS — K449 Diaphragmatic hernia without obstruction or gangrene: Secondary | ICD-10-CM

## 2012-06-22 MED ORDER — SODIUM CHLORIDE 0.9 % IV SOLN: 500.0000 mL | INTRAVENOUS | Status: DC

## 2012-06-22 NOTE — Progress Notes (Signed)
Patient did not experience any of the following events: a burn prior to discharge; a fall within the facility; wrong site/side/patient/procedure/implant event; or a hospital transfer or hospital admission upon discharge from the facility. (G8907) Patient did not have preoperative order for IV antibiotic SSI prophylaxis. (G8918)  

## 2012-06-22 NOTE — Op Note (Signed)
[facility] [address] [place], [place]  [id number]  ENDOSCOPY PROCEDURE REPORT  PATIENT:  Lowery, [name]  MR#:  [id number] BIRTHDATE:  [birth year], 45 yrs. old  GENDER:  male ENDOSCOPIST:  Norberto Sorenson T. Fuller Plan, MD, [facility]  PROCEDURE DATE:  06/22/2012 PROCEDURE:  EGD with biopsy, 67[address] ASA CLASS:  Class II INDICATIONS:  chest pain, GERD MEDICATIONS:  MAC sedation, administered by CRNA, propofol (Diprivan) 150 mg IV, robinal 0.1 mg IV TOPICAL ANESTHETIC:  Cetacaine Spray DESCRIPTION OF PROCEDURE:   After the risks benefits and alternatives of the procedure were thoroughly explained, informed consent was obtained.  The LB GIF-H180 [id number] endoscope was introduced through the mouth and advanced to the second portion of the duodenum, without limitations.  The instrument was slowly withdrawn as the mucosa was fully examined. <<PROCEDUREIMAGES>> Irregular Z-line. Multiple biopsies were obtained and sent to pathology.  Otherwise normal esophagus.  The stomach was entered and closely examined. The antrum, angularis, and lesser curvature were well visualized, including a retroflexed view of the cardia and fundus. The stomach wall was normally distensable. The scope passed easily through the pylorus into the duodenum.  The duodenal bulb was normal in appearance, as was the postbulbar duodenum. Retroflexed views revealed a hiatal hernia, small. The scope was then withdrawn from the patient and the procedure completed.  COMPLICATIONS:  None  ENDOSCOPIC IMPRESSION: 1) Irregular Z-line 2) Small hiatal hernia  RECOMMENDATIONS: 1) Anti-reflux regimen 2) PPI bid 3) Await pathology  Kabria Hetzer T. Fuller Plan, MD, [facility]  n. eSIGNED:   Pricilla Riffle. Jeannine Lowery at 06/22/2012 09:11 AM  Gwenevere Ghazi, [id number]

## 2012-06-22 NOTE — Patient Instructions (Addendum)
YOU HAD AN ENDOSCOPIC PROCEDURE TODAY AT [facility]: Refer to the procedure report that was given to you for any specific questions about what was found during the examination.  If the procedure report does not answer your questions, please call your gastroenterologist to clarify.  If you requested that your care partner not be given the details of your procedure findings, then the procedure report has been included in a sealed envelope for you to review at your convenience later.  YOU SHOULD EXPECT: Some feelings of bloating in the abdomen. Passage of more gas than usual.  Walking can help get rid of the air that was put into your GI tract during the procedure and reduce the bloating. If you had a lower endoscopy (such as a colonoscopy or flexible sigmoidoscopy) you may notice spotting of blood in your stool or on the toilet paper. If you underwent a bowel prep for your procedure, then you may not have a normal bowel movement for a few days.  DIET: Your first meal following the procedure should be a light meal and then it is ok to progress to your normal diet.  A half-sandwich or bowl of soup is an example of a good first meal.  Heavy or fried foods are harder to digest and may make you feel nauseous or bloated.  Likewise meals heavy in dairy and vegetables can cause extra gas to form and this can also increase the bloating.  Drink plenty of fluids but you should avoid alcoholic beverages for 24 hours.  ACTIVITY: Your care partner should take you home directly after the procedure.  You should plan to take it easy, moving slowly for the rest of the day.  You can resume normal activity the day after the procedure however you should NOT DRIVE or use heavy machinery for 24 hours (because of the sedation medicines used during the test).    SYMPTOMS TO REPORT IMMEDIATELY: A gastroenterologist can be reached at any hour.  During normal business hours, 8:30 AM to 5:00 PM Monday through Friday,  call [phone].  After hours and on weekends, please call the GI answering service at [phone] who will take a message and have the physician on call contact you.   Following lower endoscopy (colonoscopy or flexible sigmoidoscopy):  Excessive amounts of blood in the stool  Significant tenderness or worsening of abdominal pains  Swelling of the abdomen that is new, acute  Fever of 100F or higher  Following upper endoscopy (EGD)  Vomiting of blood or coffee ground material  New chest pain or pain under the shoulder blades  Painful or persistently difficult swallowing  New shortness of breath  Fever of 100F or higher  Black, tarry-looking stools  FOLLOW UP: If any biopsies were taken you will be contacted by phone or by letter within the next 1-3 weeks.  Call your gastroenterologist if you have not heard about the biopsies in 3 weeks.  Our staff will call the home number listed on your records the next business day following your procedure to check on you and address any questions or concerns that you may have at that time regarding the information given to you following your procedure. This is a courtesy call and so if there is no answer at the home number and we have not heard from you through the emergency physician on call, we will assume that you have returned to your regular daily activities without incident.  SIGNATURES/CONFIDENTIALITY: You and/or your care  partner have signed paperwork which will be entered into your electronic medical record.  These signatures attest to the fact that that the information above on your After Visit Summary has been reviewed and is understood.  Full responsibility of the confidentiality of this discharge information lies with you and/or your care-partner.   HANDOUT ON HIATAL HERNIA,REFLUX

## 2012-06-25 ENCOUNTER — Telehealth: Payer: Self-pay | Admitting: *Deleted

## 2012-06-25 NOTE — Telephone Encounter (Signed)
  Follow up Call-  Call back number 06/22/2012  Post procedure Call Back phone  # [phone]  Permission to leave phone message Yes     Spoke with Olene Floss and she states [name] went to work this morning and is doing fine.  Instructed her to have [name] call us if he is experiencing any problems or has questions. She verbalized understanding

## 2012-06-28 ENCOUNTER — Encounter: Payer: Self-pay | Admitting: Gastroenterology

## 2012-08-15 ENCOUNTER — Other Ambulatory Visit: Payer: Self-pay | Admitting: Family Medicine

## 2012-08-17 ENCOUNTER — Telehealth: Payer: Self-pay | Admitting: Internal Medicine

## 2012-08-17 ENCOUNTER — Encounter: Payer: Self-pay | Admitting: Family

## 2012-08-17 ENCOUNTER — Ambulatory Visit (INDEPENDENT_AMBULATORY_CARE_PROVIDER_SITE_OTHER): Payer: Medicare Other | Admitting: Family

## 2012-08-17 VITALS — BP 138/88 | HR 68 | Wt 210.0 lb

## 2012-08-17 DIAGNOSIS — M7730 Calcaneal spur, unspecified foot: Secondary | ICD-10-CM

## 2012-08-17 DIAGNOSIS — M79609 Pain in unspecified limb: Secondary | ICD-10-CM

## 2012-08-17 DIAGNOSIS — Z23 Encounter for immunization: Secondary | ICD-10-CM

## 2012-08-17 DIAGNOSIS — M79672 Pain in left foot: Secondary | ICD-10-CM

## 2012-08-17 NOTE — Telephone Encounter (Signed)
Ov with padonda today

## 2012-08-17 NOTE — Telephone Encounter (Signed)
Caller: Patsy/Patient;  PCP: Benay Pillow (Adults only); Best Callback Phone Number: [phone]. Patient reports history of heel spurs, onset 08/13/12 of symptoms again. Guideline: Foot Non-Injury, Disposition: See within 24 hours due to  moderate pain not improved with home care, no appointment available in EPIC schedule, please call with recommendations.

## 2012-08-17 NOTE — Progress Notes (Signed)
Subjective:    Patient ID: [name], male    DOB: 1945-11-20, 66 y.o.   MRN: [id number]  HPI 66 year old white male, nonsmoker, patient of Dr. Arnoldo Morale is in today with complaints of left heel pain. He has a history of heel spurs diagnosed by x-ray 3 months ago. Initially, patient wants to try conservative measures to include anti-inflammatories has been ineffective. He is requesting an injection in detail today. No history of any peripheral vascular disease or diabetes.    Review of Systems  Constitutional: Negative.   Respiratory: Negative.   Cardiovascular: Negative.   Musculoskeletal: Positive for gait problem. Negative for myalgias and joint swelling.       Left heel pain  Skin: Negative.   Neurological: Negative.   Hematological: Negative.   Psychiatric/Behavioral: Negative.    Past Medical History  Diagnosis Date  . GERD (gastroesophageal reflux disease)   . Adenomatous polyps 03/2004  . Trigger finger (acquired)   . Coronary atherosclerosis of unspecified type of vessel, native or graft   . Hypertension   . Hyperlipidemia   . 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 Esophagitis 1993  . Sleep apnea   . Arthritis   . Myocardial infarction     History   Social History  . Marital Status: Married    Spouse Name: N/A    Number of Children: 3  . Years of Education: N/A   Occupational History  . apartment maintenance    Social History Main Topics  . Smoking status: Never Smoker   . Smokeless tobacco: Current User    Types: Chew  . Alcohol Use: No  . Drug Use: No  . Sexually Active: Not on file   Other Topics Concern  . Not on file   Social History Narrative   Pt does not get regular exercise--daily caffeine use-5 cups daily    Past Surgical History  Procedure Date  . Cholecystectomy   . Rotator cuff repair 2009  . Lumbar fusion 1983  . Angioplasty 1983    Family History  Problem Relation Age of Onset  . Stroke Mother   . COPD  Father   . Heart disease Sister   . Colon cancer Neg Hx     Allergies  Allergen Reactions  . Penicillins     REACTION: rash    Current Outpatient Prescriptions on File Prior to Visit  Medication Sig Dispense Refill  . AMLODIPINE-VALSARTAN-HCTZ PO       . aspirin 81 MG tablet Take 81 mg by mouth daily.        Marland Kitchen atenolol (TENORMIN) 25 MG tablet TAKE ONE TABLET BY MOUTH EVERY DAY  90 tablet  3  . DIOVAN HCT 160-25 MG per tablet TAKE ONE TABLET DAILY.  30 each  11  . diphenhydramine-acetaminophen (TYLENOL PM) 25-500 MG TABS Take 1 tablet by mouth at bedtime as needed.        Marland Kitchen esomeprazole (NEXIUM) 40 MG capsule Take 1 capsule (40 mg total) by mouth 2 (two) times daily.  60 capsule  5  . FLONASE 50 MCG/ACT nasal spray INHALE 1 SPRAY INTO EACH NOSTRIL ONCE DAILY.  16 g  11  . LIPITOR 10 MG tablet TAKE ONE TABLET DAILY.  30 each  6  . meloxicam (MOBIC) 15 MG tablet Take 1 tablet (15 mg total) by mouth daily.  30 tablet  0  . mercaptopurine (PURINETHOL) 50 MG tablet TAKE 1 AND 1/2 TABLETS DAILY.GIVE ON AN EMPTY STOMACH 1 HOUR BEFORE OR  2 HOURS AFTER MEAL.  45 tablet  5  . nitroGLYCERIN (NITROSTAT) 0.4 MG SL tablet Place 1 tablet (0.4 mg total) under the tongue every 5 (five) minutes as needed.  30 tablet  3  . ZOVIRAX 5 % ointment USE AS DIRECTED BY  DOCTOR  15 g  0    BP 138/88  Pulse 68  Wt 210 lb (95.255 kg)  SpO2 96%chart    Objective:   Physical Exam  Constitutional: He is oriented to person, place, and time. He appears well-developed and well-nourished.  Cardiovascular: Normal rate, regular rhythm and normal heart sounds.   Pulmonary/Chest: Effort normal and breath sounds normal.  Musculoskeletal: Normal range of motion. He exhibits no edema and no tenderness.  Neurological: He is alert and oriented to person, place, and time.  Skin: Skin is warm and dry.  Psychiatric: He has a normal mood and affect.      Informed consent was obtained. Under sterile conditions, left foot  was prepped with Betadine. Pre-anesthetized. Fluid was injected with 25-gauge, 1 inch needle 1 cc of lidocaine, followed by 40 mg of Kenalog. Dressed and bandaged. Patient tolerated the procedure well. No adverse effects.     Assessment & Plan:  Assessment: Left heel pain, heel spurs  Plan: Recheck with Dr. Arnoldo Morale as scheduled. Consider podiatry consult if symptoms do not improve.

## 2012-08-23 ENCOUNTER — Other Ambulatory Visit: Payer: Self-pay | Admitting: Internal Medicine

## 2012-08-30 ENCOUNTER — Telehealth: Payer: Self-pay | Admitting: Internal Medicine

## 2012-08-30 NOTE — Telephone Encounter (Signed)
Caller: [name];PCP: Benay Pillow (Adults only); Best Callback Phone Number: [phone], patient calling to report ~ 2 weeks ago, injection was given for "heel spurs per xray" Guideline: Foot non injury, states injection didn't help. Disposition: See within 24 hours.  Appointment advised. Patient agrees to appointment 0945 08/31/12 with P Megan Salon.

## 2012-08-31 ENCOUNTER — Encounter: Payer: Self-pay | Admitting: Family

## 2012-08-31 ENCOUNTER — Ambulatory Visit (INDEPENDENT_AMBULATORY_CARE_PROVIDER_SITE_OTHER): Payer: Medicare Other | Admitting: Family

## 2012-08-31 VITALS — BP 122/82 | HR 87 | Temp 98.7°F | Wt 210.0 lb

## 2012-08-31 DIAGNOSIS — M7730 Calcaneal spur, unspecified foot: Secondary | ICD-10-CM

## 2012-08-31 DIAGNOSIS — M79609 Pain in unspecified limb: Secondary | ICD-10-CM

## 2012-08-31 DIAGNOSIS — M79673 Pain in unspecified foot: Secondary | ICD-10-CM

## 2012-08-31 MED ORDER — HYDROCODONE-ACETAMINOPHEN 5-500 MG PO TABS: 0.5000 | ORAL_TABLET | Freq: Three times a day (TID) | ORAL | Status: DC | PRN

## 2012-08-31 NOTE — Patient Instructions (Signed)
Heel Spur  A heel spur is a hook of bone that can form on the calcaneus (the heel bone and the largest bone of the foot). Heel spurs are often associated with plantar fasciitis and usually come in people who have had the problem for an extended period of time. The cause of the relationship is unknown. The pain associated with them is thought to be caused by an inflammation (soreness and redness) of the plantar fascia rather than the spur itself. The plantar fascia is a thick fibrous like tissue that runs from the calcaneus (heel bone) to the ball of the foot. This strong, tight tissue helps maintain the arch of your foot. It helps distribute the weight across your foot as you walk or run. Stresses placed on the plantar fascia can be tremendous. When it is inflamed normal activities become painful. Pain is worse in the morning after sleeping. After sleeping the plantar fascia is tight. The first movements stretch the fascia and this causes pain. As the tendon loosens, the pain usually gets better. It often returns with too much standing or walking.   About 70% of patients with plantar fasciitis have a heel spur. About half of people without foot pain also have heel spurs.  DIAGNOSIS   The diagnosis of a heel spur is made by X-ray. The X-ray shows a hook of bone protruding from the bottom of the calcaneus at the point where the plantar fascia is attached to the heel bone.   TREATMENT   It is necessary to find out what is causing the stretching of the plantar fascia. If the cause is over-pronation (flat feet), orthotics and proper foot ware may help.   Stretching exercises, losing weight, wearing shoes that have a cushioned heel that absorbs shock, and elevating the heel with the use of a heel cradle, heel cup, or orthotics may all help. Heel cradles and heel cups provide extra comfort and cushion to the heel, and reduce the amount of shock to the sore area.  AVOIDING THE PAIN OF PLANTAR FASCIITIS AND HEEL  SPURS   Consult a sports medicine professional before beginning a new exercise program.   Walking programs offer a good workout. There is a lower chance of overuse injuries common to the runners. There is less impact and less jarring of the joints.   Begin all new exercise programs slowly. If problems or pains develop, decrease the amount of time or distance until you are at a comfortable level.   Wear good shoes and replace them regularly.   Stretch your foot and the heel cords at the back of the ankle (Achilles tendons) both before and after exercise.   Run or exercise on even surfaces that are not hard. For example, asphalt is better than pavement.   Do not run barefoot on hard surfaces.   If using a treadmill, vary the incline.   Do not continue to workout if you have foot or joint problems. Seek professional help if they do not improve.  HOME CARE INSTRUCTIONS    Avoid activities that cause you pain until you recover.   Use ice or cold packs to the problem or painful areas after working out.   Only take over-the-counter or prescription medicines for pain, discomfort, or fever as directed by your caregiver.   Soft shoe inserts or athletic shoes with air or gel sole cushions may be helpful.   If problems continue or become more severe, consult a sports medicine caregiver. Cortisone is a   potent anti-inflammatory medication that may be injected into the painful area. You can discuss this treatment with your caregiver.  MAKE SURE YOU:    Understand these instructions.   Will watch your condition.   Will get help right away if you are not doing well or get worse.  Document Released: 12/07/2005 Document Revised: 01/23/2012 Document Reviewed: 02/08/2006  [employer] Patient Information 2013 [employer].

## 2012-08-31 NOTE — Progress Notes (Signed)
Subjective:    Patient ID: [name], male    DOB: 1946-10-18, 66 y.o.   MRN: [id number]  HPI 66 year old white male, nonsmoker, patient of Dr. Arnoldo Morale is in today with persistent left heel pain due to well-healed scar. He was seen last week and received a cortisone injection that was ineffective. He continues to have pain in his left heel rates it a 6/10, worse with walking. He has tried anti-inflammatories, heel cups that has been ineffective.   Review of Systems  Constitutional: Negative.   Cardiovascular: Negative.   Musculoskeletal: Positive for arthralgias.       Right heel pain due to heel spur  Skin: Negative.   Neurological: Negative.   Psychiatric/Behavioral: Negative.    Past Medical History  Diagnosis Date  . GERD (gastroesophageal reflux disease)   . Adenomatous polyps 03/2004  . Trigger finger (acquired)   . Coronary atherosclerosis of unspecified type of vessel, native or graft   . Hypertension   . Hyperlipidemia   . 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 Esophagitis 1993  . Sleep apnea   . Arthritis   . Myocardial infarction     History   Social History  . Marital Status: Married    Spouse Name: N/A    Number of Children: 3  . Years of Education: N/A   Occupational History  . apartment maintenance    Social History Main Topics  . Smoking status: Never Smoker   . Smokeless tobacco: Current User    Types: Chew  . Alcohol Use: No  . Drug Use: No  . Sexually Active: Not on file   Other Topics Concern  . Not on file   Social History Narrative   Pt does not get regular exercise--daily caffeine use-5 cups daily    Past Surgical History  Procedure Date  . Cholecystectomy   . Rotator cuff repair 2009  . Lumbar fusion 1983  . Angioplasty 1983    Family History  Problem Relation Age of Onset  . Stroke Mother   . COPD Father   . Heart disease Sister   . Colon cancer Neg Hx     Allergies  Allergen Reactions  .  Penicillins     REACTION: rash    Current Outpatient Prescriptions on File Prior to Visit  Medication Sig Dispense Refill  . AMLODIPINE-VALSARTAN-HCTZ PO       . aspirin 81 MG tablet Take 81 mg by mouth daily.        Marland Kitchen atenolol (TENORMIN) 25 MG tablet TAKE ONE TABLET BY MOUTH EVERY DAY  90 tablet  3  . DIOVAN HCT 160-25 MG per tablet TAKE ONE TABLET DAILY.  30 each  11  . diphenhydramine-acetaminophen (TYLENOL PM) 25-500 MG TABS Take 1 tablet by mouth at bedtime as needed.        Marland Kitchen esomeprazole (NEXIUM) 40 MG capsule Take 1 capsule (40 mg total) by mouth 2 (two) times daily.  60 capsule  5  . FLONASE 50 MCG/ACT nasal spray INHALE 1 SPRAY INTO EACH NOSTRIL ONCE DAILY.  16 g  11  . LIPITOR 10 MG tablet TAKE ONE TABLET DAILY.  30 tablet  11  . meloxicam (MOBIC) 15 MG tablet Take 1 tablet (15 mg total) by mouth daily.  30 tablet  0  . mercaptopurine (PURINETHOL) 50 MG tablet TAKE 1 AND 1/2 TABLETS DAILY.GIVE ON AN EMPTY STOMACH 1 HOUR BEFORE OR 2 HOURS AFTER MEAL.  45 tablet  5  . nitroGLYCERIN (NITROSTAT)  0.4 MG SL tablet Place 1 tablet (0.4 mg total) under the tongue every 5 (five) minutes as needed.  30 tablet  3  . ZOVIRAX 5 % ointment USE AS DIRECTED BY  DOCTOR  15 g  0    BP 122/82  Pulse 87  Temp 98.7 F (37.1 C) (Oral)  Wt 210 lb (95.255 kg)  SpO2 98%chart    Objective:   Physical Exam  Constitutional: He is oriented to person, place, and time. He appears well-developed and well-nourished.  Cardiovascular: Normal rate, regular rhythm and normal heart sounds.   Pulmonary/Chest: Effort normal and breath sounds normal.  Musculoskeletal: Normal range of motion. He exhibits no edema.       Pinpoint tenderness to palpation of the plantar surface of the right heel medially. No redness. No rash.   Neurological: He is alert and oriented to person, place, and time.  Skin: Skin is warm and dry. No erythema.  Psychiatric: He has a normal mood and affect.          Assessment & Plan:   Assessment: Heel spur-uncontrolled, heel pain-uncontrolled  Plan: Refer to podiatry for further management. Patient call the office with any questions or concerns. Recheck as scheduled and sooner when necessary. Vicodin one half to one tablet every 8 hours when necessary pain.

## 2012-09-04 ENCOUNTER — Other Ambulatory Visit: Payer: Self-pay | Admitting: Internal Medicine

## 2012-09-13 ENCOUNTER — Encounter: Payer: Self-pay | Admitting: Gastroenterology

## 2012-09-26 ENCOUNTER — Ambulatory Visit: Payer: Medicare Other | Admitting: Internal Medicine

## 2012-09-27 ENCOUNTER — Encounter: Payer: Self-pay | Admitting: Gastroenterology

## 2012-11-05 ENCOUNTER — Ambulatory Visit (AMBULATORY_SURGERY_CENTER): Payer: Medicare Other | Admitting: *Deleted

## 2012-11-05 VITALS — Ht 68.0 in | Wt 210.0 lb

## 2012-11-05 DIAGNOSIS — Z1211 Encounter for screening for malignant neoplasm of colon: Secondary | ICD-10-CM

## 2012-11-05 MED ORDER — MOVIPREP 100 G PO SOLR: ORAL | Status: DC

## 2012-11-12 ENCOUNTER — Telehealth: Payer: Self-pay | Admitting: Internal Medicine

## 2012-11-12 ENCOUNTER — Ambulatory Visit (INDEPENDENT_AMBULATORY_CARE_PROVIDER_SITE_OTHER): Payer: Medicare Other | Admitting: Family Medicine

## 2012-11-12 ENCOUNTER — Encounter: Payer: Self-pay | Admitting: Family Medicine

## 2012-11-12 VITALS — BP 110/70 | HR 80 | Temp 97.7°F | Wt 214.0 lb

## 2012-11-12 DIAGNOSIS — J069 Acute upper respiratory infection, unspecified: Secondary | ICD-10-CM

## 2012-11-12 MED ORDER — HYDROCODONE-HOMATROPINE 5-1.5 MG/5ML PO SYRP: 5.0000 mL | ORAL_SOLUTION | Freq: Three times a day (TID) | ORAL | Status: DC | PRN

## 2012-11-12 NOTE — Patient Instructions (Addendum)
INSTRUCTIONS FOR UPPER RESPIRATORY INFECTION:  -plenty of rest and fluids  -As we discussed, we have prescribed a new medication (HYCODAN) for you at this appointment. We discussed the common and serious potential adverse effects of this medication and you can review these and more with the pharmacist when you pick up your medication.  Please follow the instructions for use carefully and notify us immediately if you have any problems taking this medication.   -nasal saline wash 2-3 times daily (use prepackaged nasal saline or bottled/distilled water if making your own)   -can use sinex nasal spray for drainage and nasal congestion - but do NOT use longer then 3-4 days  -can use tylenol or ibuprofen as directed for aches and sorethroat  -in the winter time, using a humidifier at night is helpful (please follow cleaning instructions)  -if you are taking a cough medication - use only as directed, may also try a teaspoon of honey to coat the throat and throat lozenges  -for sore throat, salt water gargles can help  -follow up if you have fevers, facial pain, tooth pain, difficulty breathing or are worsening or not getting better in 5-7 days

## 2012-11-12 NOTE — Progress Notes (Signed)
Chief Complaint  Patient presents with  . Cough    chest congestion, scratchy throat since Thursday     HPI: Acute visit for sinus congestion: -started: 4 days ago -symptoms:nasal congestion, sore throat, cough -denies:fever, SOB, NVD, tooth pain, no recent strep, flu or mono exposure -has tried: OTC cough medications -sick contacts: none known -Hx of: no hx of lung disease -hycodan helps with cough in the past and he tolerated well   ROS: See pertinent positives and negatives per HPI.  Past Medical History  Diagnosis Date  . GERD (gastroesophageal reflux disease)   . Adenomatous polyps 03/2004  . Trigger finger (acquired)   . Coronary atherosclerosis of unspecified type of vessel, native or graft   . Hypertension   . Hyperlipidemia   . 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 Esophagitis 1993  . Sleep apnea   . Arthritis   . Myocardial infarction 1993    Family History  Problem Relation Age of Onset  . Stroke Mother   . COPD Father   . Heart disease Sister   . Colon cancer Neg Hx     History   Social History  . Marital Status: Married    Spouse Name: N/A    Number of Children: 3  . Years of Education: N/A   Occupational History  . apartment maintenance    Social History Main Topics  . Smoking status: Never Smoker   . Smokeless tobacco: Current User    Types: Chew  . Alcohol Use: No  . Drug Use: No  . Sexually Active: None   Other Topics Concern  . None   Social History Narrative   Pt does not get regular exercise--daily caffeine use-5 cups daily    Current outpatient prescriptions:aspirin 81 MG tablet, Take 81 mg by mouth daily.  , Disp: , Rfl: ;  atenolol (TENORMIN) 25 MG tablet, TAKE ONE TABLET BY MOUTH EVERY DAY, Disp: 90 tablet, Rfl: 3;  DIOVAN HCT 160-25 MG per tablet, TAKE ONE TABLET DAILY., Disp: 30 tablet, Rfl: 6;  diphenhydramine-acetaminophen (TYLENOL PM) 25-500 MG TABS, Take 1 tablet by mouth at bedtime as needed.  , Disp: , Rfl:    FLONASE 50 MCG/ACT nasal spray, INHALE 1 SPRAY INTO EACH NOSTRIL ONCE DAILY., Disp: 16 g, Rfl: 11;  LIPITOR 10 MG tablet, TAKE ONE TABLET DAILY., Disp: 30 tablet, Rfl: 11;  mercaptopurine (PURINETHOL) 50 MG tablet, TAKE 1 AND 1/2 TABLETS DAILY.GIVE ON AN EMPTY STOMACH 1 HOUR BEFORE OR 2 HOURS AFTER MEAL., Disp: 45 tablet, Rfl: 5;  MOVIPREP 100 G SOLR, MOVI PREP take as directed no substitution, Disp: 1 kit, Rfl: 0 nitroGLYCERIN (NITROSTAT) 0.4 MG SL tablet, Place 1 tablet (0.4 mg total) under the tongue every 5 (five) minutes as needed., Disp: 30 tablet, Rfl: 3;  ZOVIRAX 5 % ointment, USE AS DIRECTED BY  DOCTOR, Disp: 15 g, Rfl: 0;  HYDROcodone-homatropine (HYCODAN) 5-1.5 MG/5ML syrup, Take 5 mLs by mouth every 8 (eight) hours as needed for cough., Disp: 120 mL, Rfl: 0  EXAM:  Filed Vitals:   11/12/12 1539  BP: 110/70  Pulse: 80  Temp: 97.7 F (36.5 C)    There is no height on file to calculate BMI.  GENERAL: vitals reviewed and listed above, alert, oriented, appears well hydrated and in no acute distress  HEENT: atraumatic, conjunttiva clear, no obvious abnormalities on inspection of external nose and ears, normal appearance of ear canals and TMs, clear nasal congestion, mild post oropharyngeal erythema with PND, no  tonsillar edema or exudate, no sinus TTP  NECK: no obvious masses on inspection  LUNGS: clear to auscultation bilaterally, no wheezes, rales or rhonchi, good air movement  CV: HRRR, no peripheral edema  MS: moves all extremities without noticeable abnormality  PSYCH: pleasant and cooperative, no obvious depression or anxiety  ASSESSMENT AND PLAN:  Discussed the following assessment and plan:  1. Upper respiratory infection  HYDROcodone-homatropine (HYCODAN) 5-1.5 MG/5ML syrup   -appears well on exam, likely viral - supportive care, hycodan for cough - discussed risks -Patient advised to return or notify a doctor immediately if symptoms worsen or persist or new  concerns arise.  Patient Instructions  INSTRUCTIONS FOR UPPER RESPIRATORY INFECTION:  -plenty of rest and fluids  -As we discussed, we have prescribed a new medication (HYCODAN) for you at this appointment. We discussed the common and serious potential adverse effects of this medication and you can review these and more with the pharmacist when you pick up your medication.  Please follow the instructions for use carefully and notify us immediately if you have any problems taking this medication.   -nasal saline wash 2-3 times daily (use prepackaged nasal saline or bottled/distilled water if making your own)   -can use sinex nasal spray for drainage and nasal congestion - but do NOT use longer then 3-4 days  -can use tylenol or ibuprofen as directed for aches and sorethroat  -in the winter time, using a humidifier at night is helpful (please follow cleaning instructions)  -if you are taking a cough medication - use only as directed, may also try a teaspoon of honey to coat the throat and throat lozenges  -for sore throat, salt water gargles can help  -follow up if you have fevers, facial pain, tooth pain, difficulty breathing or are worsening or not getting better in 5-7 days      Kylina Vultaggio R.

## 2012-11-12 NOTE — Telephone Encounter (Signed)
Patient Information:  Caller Name: Demetrios  Phone: [phone]  Patient: [name], [name]  Gender: Male  DOB: [birth year]  Age: 66 Years  PCP: Benay Pillow (Adults only)  Office Follow Up:  Does the office need to follow up with this patient?: No  Instructions For The Office: N/A  RN Note:  Per protocol, advised appt within 8 hours; appt scheduled 1545 11/12/12 with Dr. Maudie Mercury.  krs/can  Symptoms  Reason For Call & Symptoms: cold symptoms, chest congestion, sinus pain  Reviewed Health History In EMR: Yes  Reviewed Medications In EMR: Yes  Reviewed Allergies In EMR: Yes  Reviewed Surgeries / Procedures: Yes  Date of Onset of Symptoms: 11/08/2012  Guideline(s) Used:  Colds  Disposition Per Guideline:   See Today in Office  Reason For Disposition Reached:   Sinus pain (not just congestion) and fever  Advice Given:  N/A  Appointment Scheduled:  11/12/2012 15:45:00 Appointment Scheduled Provider:  Colin Benton (Family Practice)

## 2012-11-13 ENCOUNTER — Encounter: Payer: Self-pay | Admitting: Gastroenterology

## 2012-11-15 ENCOUNTER — Telehealth: Payer: Self-pay | Admitting: Gastroenterology

## 2012-11-15 NOTE — Telephone Encounter (Signed)
No late cancellation charge this time.

## 2012-11-19 ENCOUNTER — Encounter: Payer: Medicare Other | Admitting: Gastroenterology

## 2012-11-28 ENCOUNTER — Telehealth: Payer: Self-pay | Admitting: Gastroenterology

## 2012-11-28 NOTE — Telephone Encounter (Signed)
No answer. Will try later

## 2012-11-28 NOTE — Telephone Encounter (Signed)
Pt is at work and does not have prep instructions with him.  He will call back when he has prep instructions to review.

## 2012-12-10 ENCOUNTER — Encounter: Payer: Self-pay | Admitting: Internal Medicine

## 2012-12-10 ENCOUNTER — Ambulatory Visit (INDEPENDENT_AMBULATORY_CARE_PROVIDER_SITE_OTHER): Payer: Medicare Other | Admitting: Internal Medicine

## 2012-12-10 VITALS — BP 140/80 | HR 72 | Temp 98.6°F | Resp 16 | Ht 68.0 in | Wt 212.0 lb

## 2012-12-10 DIAGNOSIS — I10 Essential (primary) hypertension: Secondary | ICD-10-CM

## 2012-12-10 DIAGNOSIS — R21 Rash and other nonspecific skin eruption: Secondary | ICD-10-CM

## 2012-12-10 DIAGNOSIS — J0100 Acute maxillary sinusitis, unspecified: Secondary | ICD-10-CM

## 2012-12-10 DIAGNOSIS — G4730 Sleep apnea, unspecified: Secondary | ICD-10-CM

## 2012-12-10 MED ORDER — DOXYCYCLINE HYCLATE 100 MG PO TABS: 100.0000 mg | ORAL_TABLET | Freq: Two times a day (BID) | ORAL | Status: DC

## 2012-12-10 MED ORDER — CLOTRIMAZOLE-BETAMETHASONE 1-0.05 % EX CREA: TOPICAL_CREAM | Freq: Two times a day (BID) | CUTANEOUS | Status: DC

## 2012-12-10 NOTE — Patient Instructions (Signed)
The patient is instructed to continue all medications as prescribed. Schedule followup with check out clerk upon leaving the clinic  

## 2012-12-10 NOTE — Progress Notes (Signed)
Subjective:    Patient ID: [name], male    DOB: 1946-01-11, 67 y.o.   MRN: [id number]  HPI Patient is 67 year old male with history of hypertension hyperlipidemia gastroesophageal reflux and history of coronary artery disease.  He has been stable with his chronic medical problems but recently he has had about a one-month history of upper respiratory tract infections.  Now presents with sinus tenderness and pressure is persistent with some postnasal drip and purulent discharge.     Review of Systems  Constitutional: Positive for fatigue. Negative for fever.  HENT: Positive for nosebleeds, congestion, rhinorrhea, postnasal drip and sinus pressure. Negative for hearing loss and neck pain.   Eyes: Negative for discharge, redness and visual disturbance.  Respiratory: Negative for cough, shortness of breath and wheezing.   Cardiovascular: Negative for leg swelling.  Gastrointestinal: Negative for abdominal pain, constipation and abdominal distention.  Genitourinary: Negative for urgency and frequency.  Musculoskeletal: Negative for joint swelling and arthralgias.  Skin: Negative for color change and rash.  Neurological: Negative for weakness and light-headedness.  Hematological: Negative for adenopathy.  Psychiatric/Behavioral: Negative for behavioral problems.   Past Medical History  Diagnosis Date  . GERD (gastroesophageal reflux disease)   . Adenomatous polyps 03/2004  . Trigger finger (acquired)   . Coronary atherosclerosis of unspecified type of vessel, native or graft   . Hypertension   . Hyperlipidemia   . 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 Esophagitis 1993  . Sleep apnea   . Arthritis   . Myocardial infarction 1993    History   Social History  . Marital Status: Married    Spouse Name: N/A    Number of Children: 3  . Years of Education: N/A   Occupational History  . apartment maintenance    Social History Main Topics  . Smoking status:  Never Smoker   . Smokeless tobacco: Current User    Types: Chew  . Alcohol Use: No  . Drug Use: No  . Sexually Active: Not on file   Other Topics Concern  . Not on file   Social History Narrative   Pt does not get regular exercise--daily caffeine use-5 cups daily    Past Surgical History  Procedure Date  . Cholecystectomy 2002  . Rotator cuff repair 2009    right  . Lumbar fusion 1983  . Angioplasty 1993    Family History  Problem Relation Age of Onset  . Stroke Mother   . COPD Father   . Heart disease Sister   . Colon cancer Neg Hx     Allergies  Allergen Reactions  . Penicillins     REACTION: rash    Current Outpatient Prescriptions on File Prior to Visit  Medication Sig Dispense Refill  . aspirin 81 MG tablet Take 81 mg by mouth daily.        Marland Kitchen atenolol (TENORMIN) 25 MG tablet TAKE ONE TABLET BY MOUTH EVERY DAY  90 tablet  3  . DIOVAN HCT 160-25 MG per tablet TAKE ONE TABLET DAILY.  30 tablet  6  . diphenhydramine-acetaminophen (TYLENOL PM) 25-500 MG TABS Take 1 tablet by mouth at bedtime as needed.        Marland Kitchen FLONASE 50 MCG/ACT nasal spray INHALE 1 SPRAY INTO EACH NOSTRIL ONCE DAILY.  16 g  11  . LIPITOR 10 MG tablet TAKE ONE TABLET DAILY.  30 tablet  11  . mercaptopurine (PURINETHOL) 50 MG tablet TAKE 1 AND 1/2 TABLETS DAILY.GIVE ON AN EMPTY  STOMACH 1 HOUR BEFORE OR 2 HOURS AFTER MEAL.  45 tablet  5  . nitroGLYCERIN (NITROSTAT) 0.4 MG SL tablet Place 1 tablet (0.4 mg total) under the tongue every 5 (five) minutes as needed.  30 tablet  3  . ZOVIRAX 5 % ointment USE AS DIRECTED BY  DOCTOR  15 g  0    BP 140/80  Pulse 72  Temp 98.6 F (37 C)  Resp 16  Ht 5' 8"  (1.727 m)  Wt 212 lb (96.163 kg)  BMI 32.23 kg/m2        Objective:   Physical Exam  Nursing note and vitals reviewed. Constitutional: He appears well-developed and well-nourished.  HENT:  Head: Normocephalic and atraumatic.       Sinus tenderness and swollen turbinate  Eyes: Conjunctivae  normal are normal. Pupils are equal, round, and reactive to light.  Neck: Normal range of motion. Neck supple.  Cardiovascular: Normal rate and regular rhythm.   Murmur heard. Pulmonary/Chest: Effort normal and breath sounds normal.  Abdominal: Soft. Bowel sounds are normal.          Assessment & Plan:  Chronic sinusitis he would recommend doxycycline 100 mg by mouth twice a day for 14 days His has some perianal itching and irritation from moisture that appears to be fungal in etiology we'll use Lotrisone cream twice a day to site

## 2012-12-13 ENCOUNTER — Ambulatory Visit (AMBULATORY_SURGERY_CENTER): Payer: Medicare Other | Admitting: Gastroenterology

## 2012-12-13 ENCOUNTER — Encounter: Payer: Self-pay | Admitting: Gastroenterology

## 2012-12-13 VITALS — BP 128/75 | HR 54 | Temp 97.6°F | Resp 23 | Ht 68.0 in | Wt 210.0 lb

## 2012-12-13 DIAGNOSIS — K5130 Ulcerative (chronic) rectosigmoiditis without complications: Secondary | ICD-10-CM

## 2012-12-13 DIAGNOSIS — K5150 Left sided colitis without complications: Secondary | ICD-10-CM

## 2012-12-13 DIAGNOSIS — Z1211 Encounter for screening for malignant neoplasm of colon: Secondary | ICD-10-CM

## 2012-12-13 DIAGNOSIS — Z860100 Personal history of colon polyps, unspecified: Secondary | ICD-10-CM

## 2012-12-13 DIAGNOSIS — D126 Benign neoplasm of colon, unspecified: Secondary | ICD-10-CM

## 2012-12-13 MED ORDER — SODIUM CHLORIDE 0.9 % IV SOLN: 500.0000 mL | INTRAVENOUS | Status: DC

## 2012-12-13 NOTE — Op Note (Signed)
[place]  [address]. [place], [address]   COLONOSCOPY PROCEDURE REPORT  PATIENT: [name], [name]  MR#: [id number] BIRTHDATE: [birth year] , 19  yrs. old GENDER: Male ENDOSCOPIST: Ladene Artist, MD, [facility] PROCEDURE DATE:  12/13/2012 PROCEDURE:   Colonoscopy with biopsy and snare polypectomy ASA CLASS:   Class II INDICATIONS:High risk patient with previously diagnosed UC proctosigmoid and patient's personal history of colon polyps. MEDICATIONS: MAC sedation, administered by CRNA and propofol (Diprivan) 127m IV DESCRIPTION OF PROCEDURE:   After the risks benefits and alternatives of the procedure were thoroughly explained, informed consent was obtained.  A digital rectal exam revealed no abnormalities of the rectum.   The LB [id number] endoscope was introduced through the anus and advanced to the cecum, which was identified by both the appendix and ileocecal valve. No adverse events experienced.   The quality of the prep was excellent, using MoviPrep  The instrument was then slowly withdrawn as the colon was fully examined.  COLON FINDINGS: Two sessile polyps measuring 4-6 mm in size were found in the transverse colon and ascending colon.  A polypectomy was performed with a cold snare.  The resection was complete and the polyp tissue was completely retrieved.   Abnormal mucosa was found in the rectum and sigmoid colon.  The mucosa had granularity. This was likely consistent with mild ulcerative colitis disease. Multiple random biopsies of the area were performed.  The rest of the colon appeared normal. Mulitple random biopsies of the area were performed. Retroflexed views revealed small internal hemorrhoids. The time to cecum=2 minutes 16 seconds.  Withdrawal time=13 minutes 11 seconds.  The scope was withdrawn and the procedure completed.  COMPLICATIONS: There were no complications.  ENDOSCOPIC IMPRESSION: 1.   Two sessile polyps measuring 4-6  mm in the transverse and ascending colon; polypectomy performed with a cold snare 2.   Mild proctosigmoiditis; multiple random biopsies 3.   Small internal hemorrhoids  RECOMMENDATIONS: 1.  Await pathology results 2.  Repeat Colonoscopy in 5 years.   eSigned:  MLadene Artist MD, FA[facility] LLC01/30/2014 9:02 AM

## 2012-12-13 NOTE — Progress Notes (Signed)
Patient did not experience any of the following events: a burn prior to discharge; a fall within the facility; wrong site/side/patient/procedure/implant event; or a hospital transfer or hospital admission upon discharge from the facility. (G8907) Patient did not have preoperative order for IV antibiotic SSI prophylaxis. (G8918)  

## 2012-12-13 NOTE — Progress Notes (Signed)
Called to room to assist during endoscopic procedure.  Patient ID and intended procedure confirmed with present staff. Received instructions for my participation in the procedure from the performing physician.  

## 2012-12-13 NOTE — Patient Instructions (Addendum)
YOU HAD AN ENDOSCOPIC PROCEDURE TODAY AT [facility]: Refer to the procedure report that was given to you for any specific questions about what was found during the examination.  If the procedure report does not answer your questions, please call your gastroenterologist to clarify.  If you requested that your care partner not be given the details of your procedure findings, then the procedure report has been included in a sealed envelope for you to review at your convenience later.  YOU SHOULD EXPECT: Some feelings of bloating in the abdomen. Passage of more gas than usual.  Walking can help get rid of the air that was put into your GI tract during the procedure and reduce the bloating. If you had a lower endoscopy (such as a colonoscopy or flexible sigmoidoscopy) you may notice spotting of blood in your stool or on the toilet paper. If you underwent a bowel prep for your procedure, then you may not have a normal bowel movement for a few days.  DIET: Your first meal following the procedure should be a light meal and then it is ok to progress to your normal diet.  A half-sandwich or bowl of soup is an example of a good first meal.  Heavy or fried foods are harder to digest and may make you feel nauseous or bloated.  Likewise meals heavy in dairy and vegetables can cause extra gas to form and this can also increase the bloating.  Drink plenty of fluids but you should avoid alcoholic beverages for 24 hours.  ACTIVITY: Your care partner should take you home directly after the procedure.  You should plan to take it easy, moving slowly for the rest of the day.  You can resume normal activity the day after the procedure however you should NOT DRIVE or use heavy machinery for 24 hours (because of the sedation medicines used during the test).    SYMPTOMS TO REPORT IMMEDIATELY: A gastroenterologist can be reached at any hour.  During normal business hours, 8:30 AM to 5:00 PM Monday through Friday,  call [phone].  After hours and on weekends, please call the GI answering service at [phone] who will take a message and have the physician on call contact you.   Following lower endoscopy (colonoscopy or flexible sigmoidoscopy):  Excessive amounts of blood in the stool  Significant tenderness or worsening of abdominal pains  Swelling of the abdomen that is new, acute  Fever of 100F or higher    FOLLOW UP: If any biopsies were taken you will be contacted by phone or by letter within the next 1-3 weeks.  Call your gastroenterologist if you have not heard about the biopsies in 3 weeks.  Our staff will call the home number listed on your records the next business day following your procedure to check on you and address any questions or concerns that you may have at that time regarding the information given to you following your procedure. This is a courtesy call and so if there is no answer at the home number and we have not heard from you through the emergency physician on call, we will assume that you have returned to your regular daily activities without incident.  SIGNATURES/CONFIDENTIALITY: You and/or your care partner have signed paperwork which will be entered into your electronic medical record.  These signatures attest to the fact that that the information above on your After Visit Summary has been reviewed and is understood.  Full responsibility of the confidentiality  of this discharge information lies with you and/or your care-partner.       Information on proctitis & polyps  given to you today

## 2012-12-14 ENCOUNTER — Telehealth: Payer: Self-pay

## 2012-12-14 NOTE — Telephone Encounter (Signed)
  Follow up Call-  Call back number 12/13/2012 06/22/2012  Post procedure Call Back phone  # [phone]  Permission to leave phone message Yes Yes     Patient questions:  Do you have a fever, pain , or abdominal swelling? no Pain Score  0 *  Have you tolerated food without any problems? yes  Have you been able to return to your normal activities? yes  Do you have any questions about your discharge instructions: Diet   no Medications  no Follow up visit  no  Do you have questions or concerns about your Care? no  Actions: * If pain score is 4 or above: No action needed, pain <4.   The pt said he was on his way to work, no problems. Maw

## 2012-12-19 ENCOUNTER — Encounter: Payer: Self-pay | Admitting: Gastroenterology

## 2013-01-03 ENCOUNTER — Other Ambulatory Visit: Payer: Self-pay | Admitting: Gastroenterology

## 2013-04-03 ENCOUNTER — Other Ambulatory Visit: Payer: Self-pay | Admitting: Internal Medicine

## 2013-04-03 ENCOUNTER — Telehealth: Payer: Self-pay | Admitting: Gastroenterology

## 2013-04-03 ENCOUNTER — Other Ambulatory Visit: Payer: Self-pay | Admitting: *Deleted

## 2013-04-03 MED ORDER — VALSARTAN-HYDROCHLOROTHIAZIDE 160-25 MG PO TABS: ORAL_TABLET | ORAL | Status: DC

## 2013-04-03 NOTE — Telephone Encounter (Signed)
Patient needs to be seen every 6 months. Pt is rescheduled for 05/20/13 at 2:00pm since he has 3 refills left on his medicine.

## 2013-04-05 ENCOUNTER — Other Ambulatory Visit (INDEPENDENT_AMBULATORY_CARE_PROVIDER_SITE_OTHER): Payer: Medicare Other

## 2013-04-05 DIAGNOSIS — E785 Hyperlipidemia, unspecified: Secondary | ICD-10-CM

## 2013-04-05 DIAGNOSIS — Z0000 Encounter for general adult medical examination without abnormal findings: Secondary | ICD-10-CM

## 2013-04-05 LAB — CBC WITH DIFFERENTIAL/PLATELET
Basophils Absolute: 0 10*3/uL (ref 0.0–0.1)
Eosinophils Absolute: 0 10*3/uL (ref 0.0–0.7)
Lymphocytes Relative: 25.6 % (ref 12.0–46.0)
MCHC: 34.4 g/dL (ref 30.0–36.0)
Monocytes Relative: 10.7 % (ref 3.0–12.0)
Neutrophils Relative %: 62.9 % (ref 43.0–77.0)
RDW: 16.5 % — ABNORMAL HIGH (ref 11.5–14.6)

## 2013-04-05 LAB — BASIC METABOLIC PANEL
CO2: 31 mEq/L (ref 19–32)
Calcium: 9.3 mg/dL (ref 8.4–10.5)
Creatinine, Ser: 1.3 mg/dL (ref 0.4–1.5)
GFR: 61.44 mL/min (ref >60.00)
Glucose, Bld: 96 mg/dL (ref 70–99)

## 2013-04-05 LAB — POCT URINALYSIS DIPSTICK
Bilirubin, UA: NEGATIVE
Glucose, UA: NEGATIVE
Ketones, UA: NEGATIVE
Leukocytes, UA: NEGATIVE

## 2013-04-05 LAB — HEPATIC FUNCTION PANEL
AST: 25 U/L (ref 0–37)
Albumin: 3.6 g/dL (ref 3.5–5.2)
Alkaline Phosphatase: 60 U/L (ref 39–117)
Bilirubin, Direct: 0.2 mg/dL (ref 0.0–0.3)
Total Bilirubin: 1 mg/dL (ref 0.3–1.2)

## 2013-04-05 LAB — LIPID PANEL
Cholesterol: 112 mg/dL (ref 0–200)
Triglycerides: 74 mg/dL (ref 0.0–149.0)
VLDL: 14.8 mg/dL (ref 0.0–40.0)

## 2013-04-10 ENCOUNTER — Other Ambulatory Visit: Payer: Self-pay | Admitting: Internal Medicine

## 2013-04-12 ENCOUNTER — Ambulatory Visit: Payer: Medicare Other | Admitting: Gastroenterology

## 2013-04-12 ENCOUNTER — Encounter: Payer: Self-pay | Admitting: Internal Medicine

## 2013-04-12 ENCOUNTER — Ambulatory Visit (INDEPENDENT_AMBULATORY_CARE_PROVIDER_SITE_OTHER): Payer: Medicare Other | Admitting: Internal Medicine

## 2013-04-12 VITALS — BP 110/70 | HR 72 | Temp 98.0°F | Resp 16 | Ht 68.0 in | Wt 214.0 lb

## 2013-04-12 DIAGNOSIS — Z0000 Encounter for general adult medical examination without abnormal findings: Secondary | ICD-10-CM

## 2013-04-12 DIAGNOSIS — M5412 Radiculopathy, cervical region: Secondary | ICD-10-CM | POA: Insufficient documentation

## 2013-04-12 MED ORDER — CYCLOBENZAPRINE HCL 5 MG PO TABS: 5.0000 mg | ORAL_TABLET | Freq: Every day | ORAL | Status: DC

## 2013-04-12 NOTE — Progress Notes (Signed)
Subjective:    Patient ID: [name], male    DOB: 26-Sep-1946, 67 y.o.   MRN: [id number]  HPI CPX Right cervical radicular pain Numbness and tingling in the AM History of back and neck HTN stable CAD stable   Review of Systems  Constitutional: Negative for fever and fatigue.  HENT: Positive for neck pain and neck stiffness. Negative for hearing loss, congestion and postnasal drip.   Eyes: Negative for discharge, redness and visual disturbance.  Respiratory: Negative for cough, shortness of breath and wheezing.   Cardiovascular: Negative for leg swelling.  Gastrointestinal: Negative for abdominal pain, constipation and abdominal distention.  Genitourinary: Negative for urgency and frequency.  Musculoskeletal: Negative for joint swelling and arthralgias.  Skin: Negative for color change and rash.  Neurological: Negative for weakness and light-headedness.  Hematological: Negative for adenopathy.  Psychiatric/Behavioral: Negative for behavioral problems.   Past Medical History  Diagnosis Date  . GERD (gastroesophageal reflux disease)   . Adenomatous polyps 03/2004  . Trigger finger (acquired)   . Coronary atherosclerosis of unspecified type of vessel, native or graft   . Hypertension   . Hyperlipidemia   . 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 Esophagitis 1993  . Sleep apnea   . Arthritis   . Myocardial infarction 1993    History   Social History  . Marital Status: Married    Spouse Name: N/A    Number of Children: 3  . Years of Education: N/A   Occupational History  . apartment maintenance    Social History Main Topics  . Smoking status: Never Smoker   . Smokeless tobacco: Current User    Types: Chew  . Alcohol Use: No  . Drug Use: No  . Sexually Active: Not on file   Other Topics Concern  . Not on file   Social History Narrative   Pt does not get regular exercise--daily caffeine use-5 cups daily    Past Surgical History  Procedure  Laterality Date  . Cholecystectomy  2002  . Rotator cuff repair  2009    right  . Lumbar fusion  1983  . Angioplasty  1993    Family History  Problem Relation Age of Onset  . Stroke Mother   . COPD Father   . Heart disease Sister   . Colon cancer Neg Hx     Allergies  Allergen Reactions  . Penicillins     REACTION: rash    Current Outpatient Prescriptions on File Prior to Visit  Medication Sig Dispense Refill  . aspirin 81 MG tablet Take 81 mg by mouth daily.        Marland Kitchen atenolol (TENORMIN) 25 MG tablet TAKE ONE TABLET BY MOUTH EVERY DAY  90 tablet  0  . clotrimazole-betamethasone (LOTRISONE) cream Apply topically 2 (two) times daily.  30 g  0  . diphenhydramine-acetaminophen (TYLENOL PM) 25-500 MG TABS Take 1 tablet by mouth at bedtime as needed.        Marland Kitchen FLONASE 50 MCG/ACT nasal spray INHALE 1 SPRAY INTO EACH NOSTRIL ONCE DAILY.  16 g  11  . LIPITOR 10 MG tablet TAKE ONE TABLET DAILY.  30 tablet  11  . mercaptopurine (PURINETHOL) 50 MG tablet TAKE 1 AND 1/2 TABLETS DAILY.GIVE ON AN EMPTY STOMACH 1 HOUR BEFORE OR 2 HOURS AFTER MEAL.  45 tablet  5  . NEXIUM 40 MG capsule       . nitroGLYCERIN (NITROSTAT) 0.4 MG SL tablet Place 1 tablet (0.4 mg total)  under the tongue every 5 (five) minutes as needed.  30 tablet  3  . valsartan-hydrochlorothiazide (DIOVAN HCT) 160-25 MG per tablet 1 qd  90 tablet  3  . ZOVIRAX 5 % ointment USE AS DIRECTED BY  DOCTOR  15 g  0   No current facility-administered medications on file prior to visit.    BP 110/70  Pulse 72  Temp(Src) 98 F (36.7 C)  Resp 16  Ht 5' 8"  (1.727 m)  Wt 214 lb (97.07 kg)  BMI 32.55 kg/m2       Objective:   Physical Exam  Nursing note and vitals reviewed. Constitutional: He appears well-developed and well-nourished.  HENT:  Head: Normocephalic and atraumatic.  Eyes: Conjunctivae are normal. Pupils are equal, round, and reactive to light.  Neck: Normal range of motion. Neck supple.  Spasm on right   Cardiovascular: Normal rate and regular rhythm.   Pulmonary/Chest: Effort normal and breath sounds normal.  Abdominal: Soft. Bowel sounds are normal.  Genitourinary: Rectum normal and prostate normal.  Musculoskeletal: He exhibits edema and tenderness.  Skin: Skin is warm and dry.          Assessment & Plan:   Patient presents for yearly preventative medicine examination.   all immunizations and health maintenance protocols were reviewed with the patient and they are up to date with these protocols.   screening laboratory values were reviewed with the patient including screening of hyperlipidemia PSA renal function and hepatic function.   There medications past medical history social history problem list and allergies were reviewed in detail.   Goals were established with regard to weight loss exercise diet in compliance with medications

## 2013-04-15 ENCOUNTER — Other Ambulatory Visit: Payer: Self-pay | Admitting: Gastroenterology

## 2013-04-30 ENCOUNTER — Ambulatory Visit (INDEPENDENT_AMBULATORY_CARE_PROVIDER_SITE_OTHER): Payer: Medicare Other | Admitting: Family Medicine

## 2013-04-30 ENCOUNTER — Encounter: Payer: Self-pay | Admitting: Family Medicine

## 2013-04-30 ENCOUNTER — Other Ambulatory Visit: Payer: Self-pay | Admitting: *Deleted

## 2013-04-30 VITALS — BP 104/68 | Temp 97.7°F | Wt 214.0 lb

## 2013-04-30 DIAGNOSIS — J329 Chronic sinusitis, unspecified: Secondary | ICD-10-CM

## 2013-04-30 MED ORDER — AZITHROMYCIN 250 MG PO TABS: ORAL_TABLET | ORAL | Status: DC

## 2013-04-30 MED ORDER — ACYCLOVIR 5 % EX OINT: TOPICAL_OINTMENT | CUTANEOUS | Status: DC

## 2013-04-30 MED ORDER — HYDROCODONE-HOMATROPINE 5-1.5 MG/5ML PO SYRP: 5.0000 mL | ORAL_SOLUTION | Freq: Three times a day (TID) | ORAL | Status: DC | PRN

## 2013-04-30 NOTE — Progress Notes (Signed)
Chief Complaint  Patient presents with  . Sinusitis    HPI:  Acute visit for sinus congestion: -started: 1 week ago, but has gotten worse the last few days -symptoms:nasal congestion, sore throat, cough, now sinus pain and pressure for last few days - R max sinus pain for several days -denies:fever, SOB, NVD, tooth pain, strep or mono exposure -has tried: saline, flonase -sick contacts: wife has a cold    ROS: See pertinent positives and negatives per HPI.  Past Medical History  Diagnosis Date  . GERD (gastroesophageal reflux disease)   . Adenomatous polyps 03/2004  . Trigger finger (acquired)   . Coronary atherosclerosis of unspecified type of vessel, native or graft   . Hypertension   . Hyperlipidemia   . 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 Esophagitis 1993  . Sleep apnea   . Arthritis   . Myocardial infarction 1993    Family History  Problem Relation Age of Onset  . Stroke Mother   . COPD Father   . Heart disease Sister   . Colon cancer Neg Hx     History   Social History  . Marital Status: Married    Spouse Name: N/A    Number of Children: 3  . Years of Education: N/A   Occupational History  . apartment maintenance    Social History Main Topics  . Smoking status: Never Smoker   . Smokeless tobacco: Current User    Types: Chew  . Alcohol Use: No  . Drug Use: No  . Sexually Active: None   Other Topics Concern  . None   Social History Narrative   Pt does not get regular exercise--daily caffeine use-5 cups daily    Current outpatient prescriptions:aspirin 81 MG tablet, Take 81 mg by mouth daily.  , Disp: , Rfl: ;  atenolol (TENORMIN) 25 MG tablet, TAKE ONE TABLET BY MOUTH EVERY DAY, Disp: 90 tablet, Rfl: 0;  clotrimazole-betamethasone (LOTRISONE) cream, Apply topically 2 (two) times daily., Disp: 30 g, Rfl: 0;  cyclobenzaprine (FLEXERIL) 5 MG tablet, Take 1 tablet (5 mg total) by mouth at bedtime., Disp: 30 tablet, Rfl:  1 diphenhydramine-acetaminophen (TYLENOL PM) 25-500 MG TABS, Take 1 tablet by mouth at bedtime as needed.  , Disp: , Rfl: ;  FLONASE 50 MCG/ACT nasal spray, INHALE 1 SPRAY INTO EACH NOSTRIL ONCE DAILY., Disp: 16 g, Rfl: 11;  LIPITOR 10 MG tablet, TAKE ONE TABLET DAILY., Disp: 30 tablet, Rfl: 11;  mercaptopurine (PURINETHOL) 50 MG tablet, TAKE 1 AND 1/2 TABLETS DAILY.GIVE ON AN EMPTY STOMACH 1 HOUR BEFORE OR 2 HOURS AFTER MEAL., Disp: 45 tablet, Rfl: 5 NEXIUM 40 MG capsule, TAKE ONE CAPSULE TWICE DAILY AS DIRECTED, Disp: 60 capsule, Rfl: 1;  nitroGLYCERIN (NITROSTAT) 0.4 MG SL tablet, Place 1 tablet (0.4 mg total) under the tongue every 5 (five) minutes as needed., Disp: 30 tablet, Rfl: 3;  valsartan-hydrochlorothiazide (DIOVAN HCT) 160-25 MG per tablet, 1 qd, Disp: 90 tablet, Rfl: 3;  ZOVIRAX 5 % ointment, USE AS DIRECTED BY  DOCTOR, Disp: 15 g, Rfl: 0 azithromycin (ZITHROMAX) 250 MG tablet, 2 tabs on the first day, then one tab daily, Disp: 6 tablet, Rfl: 0;  HYDROcodone-homatropine (HYCODAN) 5-1.5 MG/5ML syrup, Take 5 mLs by mouth every 8 (eight) hours as needed for cough., Disp: 120 mL, Rfl: 0  EXAM:  Filed Vitals:   04/30/13 1344  BP: 104/68  Temp: 97.7 F (36.5 C)    Body mass index is 32.55 kg/(m^2).  GENERAL: vitals reviewed  and listed above, alert, oriented, appears well hydrated and in no acute distress  HEENT: atraumatic, conjunttiva clear, no obvious abnormalities on inspection of external nose and ears, normal appearance of ear canals and TMs, clear nasal congestion, mild post oropharyngeal erythema with PND, no tonsillar edema or exudate, no sinus TTP  NECK: no obvious masses on inspection  LUNGS: clear to auscultation bilaterally, no wheezes, rales or rhonchi, good air movement  CV: HRRR, no peripheral edema  MS: moves all extremities without noticeable abnormality  PSYCH: pleasant and cooperative, no obvious depression or anxiety  ASSESSMENT AND PLAN:  Discussed the  following assessment and plan:  Rhinosinusitis - Plan: azithromycin (ZITHROMAX) 250 MG tablet, HYDROcodone-homatropine (HYCODAN) 5-1.5 MG/5ML syrup  -likely viral, but with sinus pain discussed possibility of bacterial sinusitis and tx, pt reports pen allergy, reports he does ok with zpak - rx provided incase symptoms not improving, risks discussed -he requested cough medication he has used in the past, rx given - risks discussed -Patient advised to return or notify a doctor immediately if symptoms worsen or persist or new concerns arise.  There are no Patient Instructions on file for this visit.   [name] Benton R.

## 2013-04-30 NOTE — Patient Instructions (Signed)
INSTRUCTIONS FOR UPPER RESPIRATORY INFECTION:  -plenty of rest and fluids  -nasal saline wash 2-3 times daily (use prepackaged nasal saline or bottled/distilled water if making your own)   -can use sinex or afrin nasal spray for drainage and nasal congestion - but do NOT use longer then 3-4 days  -can use tylenol or ibuprofen as directed for aches and sorethroat  -in the winter time, using a humidifier at night is helpful (please follow cleaning instructions)  -if you are taking a cough medication - use only as directed, may also try a teaspoon of honey to coat the throat and throat lozenges  -for sore throat, salt water gargles can help  -follow up if you have fevers, facial pain, tooth pain, difficulty breathing or are worsening or not getting better in 5-7 days

## 2013-05-20 ENCOUNTER — Other Ambulatory Visit (INDEPENDENT_AMBULATORY_CARE_PROVIDER_SITE_OTHER): Payer: Medicare Other

## 2013-05-20 ENCOUNTER — Ambulatory Visit (INDEPENDENT_AMBULATORY_CARE_PROVIDER_SITE_OTHER): Payer: Medicare Other | Admitting: Gastroenterology

## 2013-05-20 ENCOUNTER — Encounter: Payer: Self-pay | Admitting: Gastroenterology

## 2013-05-20 VITALS — BP 122/78 | HR 72 | Ht 68.0 in | Wt 213.8 lb

## 2013-05-20 DIAGNOSIS — K219 Gastro-esophageal reflux disease without esophagitis: Secondary | ICD-10-CM

## 2013-05-20 DIAGNOSIS — K5150 Left sided colitis without complications: Secondary | ICD-10-CM

## 2013-05-20 LAB — BASIC METABOLIC PANEL
CO2: 28 mEq/L (ref 19–32)
Calcium: 9.2 mg/dL (ref 8.4–10.5)
Creatinine, Ser: 1.3 mg/dL (ref 0.4–1.5)
Glucose, Bld: 86 mg/dL (ref 70–99)

## 2013-05-20 LAB — CBC WITH DIFFERENTIAL/PLATELET
Basophils Absolute: 0 10*3/uL (ref 0.0–0.1)
Basophils Relative: 0.6 % (ref 0.0–3.0)
Hemoglobin: 13.8 g/dL (ref 13.0–17.0)
Lymphocytes Relative: 27.2 % (ref 12.0–46.0)
Monocytes Relative: 11.2 % (ref 3.0–12.0)
Neutro Abs: 4.4 10*3/uL (ref 1.4–7.7)
RBC: 4.62 Mil/uL (ref 4.22–5.81)
RDW: 16.4 % — ABNORMAL HIGH (ref 11.5–14.6)

## 2013-05-20 LAB — HEPATIC FUNCTION PANEL
Albumin: 3.7 g/dL (ref 3.5–5.2)
Total Protein: 7.4 g/dL (ref 6.0–8.3)

## 2013-05-20 MED ORDER — MERCAPTOPURINE 50 MG PO TABS: ORAL_TABLET | ORAL | Status: DC

## 2013-05-20 MED ORDER — ESOMEPRAZOLE MAGNESIUM 40 MG PO CPDR: DELAYED_RELEASE_CAPSULE | ORAL | Status: DC

## 2013-05-20 NOTE — Patient Instructions (Addendum)
You have been given a separate informational sheet regarding your tobacco use, the importance of quitting and local resources to help you quit.  Your physician has requested that you go to the basement for the following lab work before leaving today: CBC, LFT's, Lipase and Bmet.  We have sent the following medications to your pharmacy for you to pick up at your convenience: Generic Nexium and 40m.  Thank you for choosing me and [facility].  MPricilla Riffle SDagoberto Ligas, MD., FMarval Regal

## 2013-05-20 NOTE — Progress Notes (Signed)
History of Present Illness: This is a 67 year old male with left-sided ulcerative colitis. Completely asymptomatic. Colonoscopy performed earlier this year showed no signs of inflammation both endoscopically and histologically. His reflux symptoms are well-controlled on daily Nexium.  Current Medications, Allergies, Past Medical History, Past Surgical History, Family History and Social History were reviewed in [employer] record.  Physical Exam: General: Well developed , well nourished, no acute distress Head: Normocephalic and atraumatic Eyes:  sclerae anicteric, EOMI Ears: Normal auditory acuity Mouth: No deformity or lesions Lungs: Clear throughout to auscultation Heart: Regular rate and rhythm; no murmurs, rubs or bruits Abdomen: Soft, non tender and non distended. No masses, hepatosplenomegaly or hernias noted. Normal Bowel sounds Musculoskeletal: Symmetrical with no gross deformities  Pulses:  Normal pulses noted Extremities: No clubbing, cyanosis, edema or deformities noted Neurological: Alert oriented x 4, grossly nonfocal Psychological:  Alert and cooperative. Normal mood and affect  Assessment and Recommendations:  1. Ulcerative proctosigmoiditis under excellent control on current therapy. Standard blood work today. Continue 6-MP 75 mg daily. Followup visit in 6 months.   2. GERD. Continue esomeprazole 40 mg daily. Intensify antireflux measures. Use TUMS as needed.

## 2013-06-19 ENCOUNTER — Telehealth: Payer: Self-pay | Admitting: Gastroenterology

## 2013-06-19 NOTE — Telephone Encounter (Signed)
Patient states [facility] does not have generic Nexium. Told patient to call another pharmacy to check to see if they have the generic and call me back if I need to switch the prescription to another pharmacy.

## 2013-07-11 ENCOUNTER — Other Ambulatory Visit: Payer: Self-pay | Admitting: *Deleted

## 2013-07-11 MED ORDER — ATENOLOL 25 MG PO TABS: ORAL_TABLET | ORAL | Status: DC

## 2013-08-02 ENCOUNTER — Other Ambulatory Visit: Payer: Self-pay | Admitting: Internal Medicine

## 2013-08-16 ENCOUNTER — Ambulatory Visit (INDEPENDENT_AMBULATORY_CARE_PROVIDER_SITE_OTHER): Payer: Medicare Other | Admitting: Internal Medicine

## 2013-08-16 ENCOUNTER — Encounter: Payer: Self-pay | Admitting: Internal Medicine

## 2013-08-16 VITALS — BP 120/74 | HR 68 | Temp 98.2°F | Resp 16 | Ht 68.0 in | Wt 214.0 lb

## 2013-08-16 DIAGNOSIS — G4730 Sleep apnea, unspecified: Secondary | ICD-10-CM

## 2013-08-16 DIAGNOSIS — M5412 Radiculopathy, cervical region: Secondary | ICD-10-CM

## 2013-08-16 DIAGNOSIS — E785 Hyperlipidemia, unspecified: Secondary | ICD-10-CM

## 2013-08-16 DIAGNOSIS — I10 Essential (primary) hypertension: Secondary | ICD-10-CM

## 2013-08-16 DIAGNOSIS — R972 Elevated prostate specific antigen [PSA]: Secondary | ICD-10-CM

## 2013-08-16 DIAGNOSIS — Z23 Encounter for immunization: Secondary | ICD-10-CM

## 2013-08-16 NOTE — Assessment & Plan Note (Signed)
Stable blood pressure No chest pain or SOB adherence good

## 2013-08-16 NOTE — Progress Notes (Signed)
Subjective:    Patient ID: [name], male    DOB: 06-28-1946, 67 y.o.   MRN: [id number]  HPI Patient is a 67 year old male who presents for followup of hypertension and hyperlipidemia.  He has a history of gastroesophageal reflux on Nexium 40 mg by mouth daily he had a colonoscopy with polyp followed by Dr. Norberto Sorenson T. Fuller Plan.  Blood pressure stable on current medications no chest pain or shortness of breath   Review of Systems  Constitutional: Negative for fever and fatigue.  HENT: Negative for hearing loss, congestion, neck pain and postnasal drip.   Eyes: Negative for discharge, redness and visual disturbance.  Respiratory: Negative for cough, shortness of breath and wheezing.   Cardiovascular: Negative for leg swelling.  Gastrointestinal: Negative for abdominal pain, constipation and abdominal distention.  Genitourinary: Negative for urgency and frequency.  Musculoskeletal: Negative for joint swelling and arthralgias.  Skin: Negative for color change and rash.  Neurological: Negative for weakness and light-headedness.  Hematological: Negative for adenopathy.  Psychiatric/Behavioral: Negative for behavioral problems.   Past Medical History  Diagnosis Date  . GERD (gastroesophageal reflux disease)   . Adenomatous polyps 03/2004  . Trigger finger (acquired)   . Coronary atherosclerosis of unspecified type of vessel, native or graft   . Hypertension   . Hyperlipidemia   . 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 Esophagitis 1993  . Sleep apnea   . Arthritis   . Myocardial infarction 1993    History   Social History  . Marital Status: Married    Spouse Name: N/A    Number of Children: 3  . Years of Education: N/A   Occupational History  . apartment maintenance    Social History Main Topics  . Smoking status: Never Smoker   . Smokeless tobacco: Current User    Types: Chew  . Alcohol Use: No  . Drug Use: No  . Sexual Activity: Not on file    Other Topics Concern  . Not on file   Social History Narrative   Pt does not get regular exercise--daily caffeine use-5 cups daily    Past Surgical History  Procedure Laterality Date  . Cholecystectomy  2002  . Rotator cuff repair  2009    right  . Lumbar fusion  1983  . Angioplasty  1993    Family History  Problem Relation Age of Onset  . Stroke Mother   . COPD Father   . Heart disease Sister   . Colon cancer Neg Hx     Allergies  Allergen Reactions  . Penicillins     REACTION: rash    Current Outpatient Prescriptions on File Prior to Visit  Medication Sig Dispense Refill  . acyclovir ointment (ZOVIRAX) 5 % Apply topically every 3 (three) hours.  15 g  2  . aspirin 81 MG tablet Take 81 mg by mouth daily.        Marland Kitchen atenolol (TENORMIN) 25 MG tablet TAKE ONE TABLET BY MOUTH EVERY DAY  90 tablet  3  . atorvastatin (LIPITOR) 10 MG tablet TAKE ONE TABLET DAILY.  90 tablet  3  . clotrimazole-betamethasone (LOTRISONE) cream Apply topically 2 (two) times daily.  30 g  0  . diphenhydramine-acetaminophen (TYLENOL PM) 25-500 MG TABS Take 1 tablet by mouth at bedtime as needed.        Marland Kitchen esomeprazole (NEXIUM) 40 MG capsule Please prescribe generic form. One capsule by mouth once daily  30 capsule  5  . FLONASE 50 MCG/ACT  nasal spray INHALE 1 SPRAY INTO EACH NOSTRIL ONCE DAILY.  16 g  11  . mercaptopurine (PURINETHOL) 50 MG tablet TAKE 1 AND 1/2 TABLETS DAILY.GIVE ON AN EMPTY STOMACH 1 HOUR BEFORE OR 2 HOURS AFTER MEAL.  45 tablet  5  . nitroGLYCERIN (NITROSTAT) 0.4 MG SL tablet Place 1 tablet (0.4 mg total) under the tongue every 5 (five) minutes as needed.  30 tablet  3  . valsartan-hydrochlorothiazide (DIOVAN HCT) 160-25 MG per tablet 1 qd  90 tablet  3   No current facility-administered medications on file prior to visit.    BP 120/74  Pulse 68  Temp(Src) 98.2 F (36.8 C)  Resp 16  Ht 5' 8"  (1.727 m)  Wt 214 lb (97.07 kg)  BMI 32.55 kg/m2       Objective:    Physical Exam  Constitutional: He appears well-developed and well-nourished.  HENT:  Head: Normocephalic and atraumatic.  Eyes: Conjunctivae are normal. Pupils are equal, round, and reactive to light.  Neck: Normal range of motion. Neck supple.  Cardiovascular: Normal rate.   Murmur heard. Pulmonary/Chest: Effort normal and breath sounds normal.  Abdominal: Soft. Bowel sounds are normal.          Assessment & Plan:  Stable HTN No chest pain stable CAN Risk strtification:   LDL at goal, HTN control at goal Weight NOT at goal Needs to join the [facility]

## 2013-08-16 NOTE — Assessment & Plan Note (Signed)
Largely resolved after changing sleep position.

## 2013-08-26 ENCOUNTER — Ambulatory Visit (INDEPENDENT_AMBULATORY_CARE_PROVIDER_SITE_OTHER): Payer: Medicare Other | Admitting: Family Medicine

## 2013-08-26 ENCOUNTER — Encounter: Payer: Self-pay | Admitting: Family Medicine

## 2013-08-26 VITALS — BP 124/70 | HR 69 | Temp 98.5°F | Wt 213.0 lb

## 2013-08-26 DIAGNOSIS — J209 Acute bronchitis, unspecified: Secondary | ICD-10-CM

## 2013-08-26 MED ORDER — AZITHROMYCIN 250 MG PO TABS: ORAL_TABLET | ORAL | Status: DC

## 2013-08-26 MED ORDER — HYDROCODONE-HOMATROPINE 5-1.5 MG/5ML PO SYRP: 5.0000 mL | ORAL_SOLUTION | ORAL | Status: DC | PRN

## 2013-08-26 NOTE — Progress Notes (Signed)
  Subjective:    Patient ID: [name], male    DOB: February 05, 1946, 67 y.o.   MRN: [id number]  HPI Here for 5 days of PND, ST, and a dry cough. No fever.    Review of Systems  Constitutional: Negative.   HENT: Positive for congestion and postnasal drip.   Eyes: Negative.   Respiratory: Positive for cough.        Objective:   Physical Exam  Constitutional: He appears well-developed and well-nourished.  HENT:  Right Ear: External ear normal.  Left Ear: External ear normal.  Nose: Nose normal.  Mouth/Throat: Oropharynx is clear and moist.  Eyes: Conjunctivae are normal.  Pulmonary/Chest: Effort normal. No respiratory distress. He has no wheezes. He has no rales.  Scattered rhonchi   Lymphadenopathy:    He has no cervical adenopathy.          Assessment & Plan:  Add Mucinex.

## 2013-11-22 ENCOUNTER — Ambulatory Visit (INDEPENDENT_AMBULATORY_CARE_PROVIDER_SITE_OTHER): Payer: Medicare HMO | Admitting: Family Medicine

## 2013-11-22 ENCOUNTER — Encounter: Payer: Self-pay | Admitting: Family Medicine

## 2013-11-22 VITALS — BP 120/70 | HR 65 | Temp 98.5°F | Wt 212.0 lb

## 2013-11-22 DIAGNOSIS — S239XXA Sprain of unspecified parts of thorax, initial encounter: Secondary | ICD-10-CM

## 2013-11-22 DIAGNOSIS — S29019A Strain of muscle and tendon of unspecified wall of thorax, initial encounter: Secondary | ICD-10-CM

## 2013-11-22 MED ORDER — METHYLPREDNISOLONE 4 MG PO KIT: PACK | ORAL | Status: AC

## 2013-11-22 MED ORDER — HYDROCODONE-ACETAMINOPHEN 10-325 MG PO TABS: 1.0000 | ORAL_TABLET | Freq: Four times a day (QID) | ORAL | Status: DC | PRN

## 2013-11-22 NOTE — Progress Notes (Signed)
   Subjective:    Patient ID: [name], male    DOB: 26-May-1946, 68 y.o.   MRN: [id number]  HPI Here for 5 days of pain in the upper back between the shoulder blades. He thinks this may have started after he moved furniture last weekend. No pain down the arms. Using heat and Aleve.    Review of Systems  Constitutional: Negative.   Musculoskeletal: Positive for back pain.       Objective:   Physical Exam  Constitutional: He appears well-developed and well-nourished.  Musculoskeletal:  Tender over the spine in the lower cervical and upper thoracic areas. Full ROM           Assessment & Plan:  Thoracic strain. Rest and heat. Add a Medrol dose pack and Vicodin.

## 2013-11-22 NOTE — Progress Notes (Signed)
Pre visit review using our clinic review tool, if applicable. No additional management support is needed unless otherwise documented below in the visit note. 

## 2013-12-10 ENCOUNTER — Ambulatory Visit (INDEPENDENT_AMBULATORY_CARE_PROVIDER_SITE_OTHER): Payer: Medicare HMO | Admitting: Family Medicine

## 2013-12-10 ENCOUNTER — Encounter: Payer: Self-pay | Admitting: Family Medicine

## 2013-12-10 VITALS — BP 102/70 | Temp 98.3°F | Wt 213.0 lb

## 2013-12-10 DIAGNOSIS — R21 Rash and other nonspecific skin eruption: Secondary | ICD-10-CM

## 2013-12-10 MED ORDER — DOXYCYCLINE HYCLATE 100 MG PO TABS: 100.0000 mg | ORAL_TABLET | Freq: Two times a day (BID) | ORAL | Status: DC

## 2013-12-10 MED ORDER — TRIAMCINOLONE ACETONIDE 0.025 % EX CREA
TOPICAL_CREAM | CUTANEOUS | Status: DC
Start: 2013-12-10 — End: 2014-02-17

## 2013-12-10 NOTE — Patient Instructions (Signed)
-  As we discussed, we have prescribed a new medication for you at this appointment. We discussed the common and serious potential adverse effects of this medication and you can review these and more with the pharmacist when you pick up your medication.  Please follow the instructions for use carefully and notify us immediately if you have any problems taking this medication.  -use the cream twice daily  -stop the pain medication  -use all hypoallergenic soaps and detergents and no lotions  - dove unscented soap is good  -take a zyrtec daily  -follow up with your doctor in 3-5 days or sooner if worsening or not improving

## 2013-12-10 NOTE — Progress Notes (Signed)
Pre visit review using our clinic review tool, if applicable. No additional management support is needed unless otherwise documented below in the visit note. 

## 2013-12-10 NOTE — Progress Notes (Signed)
Chief Complaint  Patient presents with  . Rash    on face since Sunday; itchy     HPI:  Acute visit for Rash: -started 2 days ago -itchy rash on face -rash is not anywhere else -new exposure: uses electric razor, can not think of a new exposure in terms of soap - uses tide, dial soap -has tried benadryl cream - this does not seem to help -denies: fevers, pain, swelling of lips, face or throat, SOB -only new medication he is taking is the hydrocodone  ROS: See pertinent positives and negatives per HPI.  Past Medical History  Diagnosis Date  . GERD (gastroesophageal reflux disease)   . Adenomatous polyps 03/2004  . Trigger finger (acquired)   . Coronary atherosclerosis of unspecified type of vessel, native or graft   . Hypertension   . Hyperlipidemia   . 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 Esophagitis 1993  . Sleep apnea   . Arthritis   . Myocardial infarction 1993    Past Surgical History  Procedure Laterality Date  . Cholecystectomy  2002  . Rotator cuff repair  2009    right  . Lumbar fusion  1983  . Angioplasty  1993    Family History  Problem Relation Age of Onset  . Stroke Mother   . COPD Father   . Heart disease Sister   . Colon cancer Neg Hx     History   Social History  . Marital Status: Married    Spouse Name: N/A    Number of Children: 3  . Years of Education: N/A   Occupational History  . apartment maintenance    Social History Main Topics  . Smoking status: Never Smoker   . Smokeless tobacco: Current User    Types: Chew  . Alcohol Use: No  . Drug Use: No  . Sexual Activity: None   Other Topics Concern  . None   Social History Narrative   Pt does not get regular exercise--daily caffeine use-5 cups daily    Current outpatient prescriptions:acyclovir ointment (ZOVIRAX) 5 %, Apply topically every 3 (three) hours., Disp: 15 g, Rfl: 2;  aspirin 81 MG tablet, Take 81 mg by mouth daily.  , Disp: , Rfl: ;  atenolol (TENORMIN)  25 MG tablet, TAKE ONE TABLET BY MOUTH EVERY DAY, Disp: 90 tablet, Rfl: 3;  atorvastatin (LIPITOR) 10 MG tablet, TAKE ONE TABLET DAILY., Disp: 90 tablet, Rfl: 3 azithromycin (ZITHROMAX) 250 MG tablet, As directed, Disp: 6 tablet, Rfl: 0;  clotrimazole-betamethasone (LOTRISONE) cream, Apply topically 2 (two) times daily., Disp: 30 g, Rfl: 0;  diphenhydramine-acetaminophen (TYLENOL PM) 25-500 MG TABS, Take 1 tablet by mouth at bedtime as needed.  , Disp: , Rfl: ;  esomeprazole (NEXIUM) 40 MG capsule, Please prescribe generic form. One capsule by mouth once daily, Disp: 30 capsule, Rfl: 5 FLONASE 50 MCG/ACT nasal spray, INHALE 1 SPRAY INTO EACH NOSTRIL ONCE DAILY., Disp: 16 g, Rfl: 11;  HYDROcodone-acetaminophen (NORCO) 10-325 MG per tablet, Take 1 tablet by mouth every 6 (six) hours as needed for moderate pain., Disp: 60 tablet, Rfl: 0;  HYDROcodone-homatropine (HYDROMET) 5-1.5 MG/5ML syrup, Take 5 mLs by mouth every 4 (four) hours as needed for cough., Disp: 240 mL, Rfl: 0 mercaptopurine (PURINETHOL) 50 MG tablet, TAKE 1 AND 1/2 TABLETS DAILY.GIVE ON AN EMPTY STOMACH 1 HOUR BEFORE OR 2 HOURS AFTER MEAL., Disp: 45 tablet, Rfl: 5;  nitroGLYCERIN (NITROSTAT) 0.4 MG SL tablet, Place 1 tablet (0.4 mg total) under the tongue  every 5 (five) minutes as needed., Disp: 30 tablet, Rfl: 3;  valsartan-hydrochlorothiazide (DIOVAN HCT) 160-25 MG per tablet, 1 qd, Disp: 90 tablet, Rfl: 3 doxycycline (VIBRA-TABS) 100 MG tablet, Take 1 tablet (100 mg total) by mouth 2 (two) times daily., Disp: 20 tablet, Rfl: 0;  triamcinolone (KENALOG) 0.025 % cream, Once to twice daily to rash for 1 week, Disp: 30 g, Rfl: 0  EXAM:  Filed Vitals:   12/10/13 1410  BP: 102/70  Temp: 98.3 F (36.8 C)    Body mass index is 32.39 kg/(m^2).  GENERAL: vitals reviewed and listed above, alert, oriented, appears well hydrated and in no acute distress  HEENT: atraumatic, conjunttiva clear, no obvious abnormalities on inspection of external  nose and ears  NECK: no obvious masses on inspection  SKIN:erythematous macular papular rash on face, neck and chest  MS: moves all extremities without noticeable abnormality  PSYCH: pleasant and cooperative, no obvious depression or anxiety  ASSESSMENT AND PLAN:  Discussed the following assessment and plan:  Rash and nonspecific skin eruption - Plan: doxycycline (VIBRA-TABS) 100 MG tablet, triamcinolone (KENALOG) 0.025 % cream  -we discussed possible serious and likely etiologies, workup and treatment, treatment risks and return precautions - with allergic rash likely, only new exposure he can think of is pain medication so did advise trial off of this, hypoallergenic regimen, antihistamine daily, low pot triam cream and abx in case infectious/inflamatory though does not appear infected  -after this discussion, Jj opted for tx per above and follow up in 3-5 days -of course, we advised Bertie  to return or notify a doctor immediately if symptoms worsen or persist or new concerns arise.  .  -Patient advised to return or notify a doctor immediately if symptoms worsen or persist or new concerns arise.  Patient Instructions  -As we discussed, we have prescribed a new medication for you at this appointment. We discussed the common and serious potential adverse effects of this medication and you can review these and more with the pharmacist when you pick up your medication.  Please follow the instructions for use carefully and notify us immediately if you have any problems taking this medication.  -use the cream twice daily  -stop the pain medication  -use all hypoallergenic soaps and detergents and no lotions  - dove unscented soap is good  -take a zyrtec daily  -follow up with your doctor in 3-5 days or sooner if worsening or not improving     [name] Ayad R.

## 2013-12-13 ENCOUNTER — Encounter: Payer: Self-pay | Admitting: *Deleted

## 2013-12-16 ENCOUNTER — Encounter: Payer: Self-pay | Admitting: Family Medicine

## 2013-12-16 ENCOUNTER — Ambulatory Visit (INDEPENDENT_AMBULATORY_CARE_PROVIDER_SITE_OTHER): Payer: Medicare HMO | Admitting: Family Medicine

## 2013-12-16 VITALS — BP 118/68 | Temp 98.2°F | Wt 213.0 lb

## 2013-12-16 DIAGNOSIS — R21 Rash and other nonspecific skin eruption: Secondary | ICD-10-CM

## 2013-12-16 NOTE — Progress Notes (Signed)
Pre visit review using our clinic review tool, if applicable. No additional management support is needed unless otherwise documented below in the visit note. 

## 2013-12-16 NOTE — Progress Notes (Signed)
Chief Complaint  Patient presents with  . follow up rash    HPI:  Follow up:  Rash: -resolved -had boil on buttock - which is resolving with doxy - drained over weekend -no fevers, malaise or other symptomss  ROS: See pertinent positives and negatives per HPI.  Past Medical History  Diagnosis Date  . GERD (gastroesophageal reflux disease)   . Adenomatous polyps 03/2004  . Trigger finger (acquired)   . Coronary atherosclerosis of unspecified type of vessel, native or graft   . Hypertension   . Hyperlipidemia   . 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 Esophagitis 1993  . Sleep apnea   . Arthritis   . Myocardial infarction 1993    Past Surgical History  Procedure Laterality Date  . Cholecystectomy  2002  . Rotator cuff repair  2009    right  . Lumbar fusion  1983  . Angioplasty  1993    Family History  Problem Relation Age of Onset  . Stroke Mother   . COPD Father   . Heart disease Sister   . Colon cancer Neg Hx     History   Social History  . Marital Status: Married    Spouse Name: N/A    Number of Children: 3  . Years of Education: N/A   Occupational History  . apartment maintenance    Social History Main Topics  . Smoking status: Never Smoker   . Smokeless tobacco: Current User    Types: Chew  . Alcohol Use: No  . Drug Use: No  . Sexual Activity: None   Other Topics Concern  . None   Social History Narrative   Pt does not get regular exercise--daily caffeine use-5 cups daily    Current outpatient prescriptions:acyclovir ointment (ZOVIRAX) 5 %, Apply topically every 3 (three) hours., Disp: 15 g, Rfl: 2;  aspirin 81 MG tablet, Take 81 mg by mouth daily.  , Disp: , Rfl: ;  atenolol (TENORMIN) 25 MG tablet, TAKE ONE TABLET BY MOUTH EVERY DAY, Disp: 90 tablet, Rfl: 3;  atorvastatin (LIPITOR) 10 MG tablet, TAKE ONE TABLET DAILY., Disp: 90 tablet, Rfl: 3 azithromycin (ZITHROMAX) 250 MG tablet, As directed, Disp: 6 tablet, Rfl: 0;   clotrimazole-betamethasone (LOTRISONE) cream, Apply topically 2 (two) times daily., Disp: 30 g, Rfl: 0;  diphenhydramine-acetaminophen (TYLENOL PM) 25-500 MG TABS, Take 1 tablet by mouth at bedtime as needed.  , Disp: , Rfl: ;  doxycycline (VIBRA-TABS) 100 MG tablet, Take 1 tablet (100 mg total) by mouth 2 (two) times daily., Disp: 20 tablet, Rfl: 0 esomeprazole (NEXIUM) 40 MG capsule, Please prescribe generic form. One capsule by mouth once daily, Disp: 30 capsule, Rfl: 5;  FLONASE 50 MCG/ACT nasal spray, INHALE 1 SPRAY INTO EACH NOSTRIL ONCE DAILY., Disp: 16 g, Rfl: 11;  HYDROcodone-acetaminophen (NORCO) 10-325 MG per tablet, Take 1 tablet by mouth every 6 (six) hours as needed for moderate pain., Disp: 60 tablet, Rfl: 0 HYDROcodone-homatropine (HYDROMET) 5-1.5 MG/5ML syrup, Take 5 mLs by mouth every 4 (four) hours as needed for cough., Disp: 240 mL, Rfl: 0;  mercaptopurine (PURINETHOL) 50 MG tablet, TAKE 1 AND 1/2 TABLETS DAILY.GIVE ON AN EMPTY STOMACH 1 HOUR BEFORE OR 2 HOURS AFTER MEAL., Disp: 45 tablet, Rfl: 5;  nitroGLYCERIN (NITROSTAT) 0.4 MG SL tablet, Place 1 tablet (0.4 mg total) under the tongue every 5 (five) minutes as needed., Disp: 30 tablet, Rfl: 3 triamcinolone (KENALOG) 0.025 % cream, Once to twice daily to rash for 1 week, Disp: 30  g, Rfl: 0;  valsartan-hydrochlorothiazide (DIOVAN HCT) 160-25 MG per tablet, 1 qd, Disp: 90 tablet, Rfl: 3  EXAM:  Filed Vitals:   12/16/13 1300  BP: 118/68  Temp: 98.2 F (36.8 C)    Body mass index is 32.39 kg/(m^2).  GENERAL: vitals reviewed and listed above, alert, oriented, appears well hydrated and in no acute distress  HEENT: atraumatic, conjunttiva clear, no obvious abnormalities on inspection of external nose and ears  NECK: no obvious masses on inspection  SKIN: rash resolved, rosacea on face, small healing book L upper buttock  MS: moves all extremities without noticeable abnormality  PSYCH: pleasant and cooperative, no obvious  depression or anxiety  ASSESSMENT AND PLAN:  Discussed the following assessment and plan:  Rash and nonspecific skin eruption -resolved -boil healing and no signs of cellulitis or abscess advised to see a doctor immediately if worsens or if does not resolve -Patient advised to return or notify a doctor immediately if symptoms worsen or persist or new concerns arise.  There are no Patient Instructions on file for this visit.   Colin Benton R.

## 2013-12-17 ENCOUNTER — Telehealth: Payer: Self-pay | Admitting: Internal Medicine

## 2013-12-17 NOTE — Telephone Encounter (Signed)
Relevant patient education mailed to patient.  

## 2014-01-21 ENCOUNTER — Other Ambulatory Visit: Payer: Self-pay | Admitting: Gastroenterology

## 2014-01-22 ENCOUNTER — Telehealth: Payer: Self-pay | Admitting: Gastroenterology

## 2014-01-22 ENCOUNTER — Other Ambulatory Visit: Payer: Self-pay | Admitting: Gastroenterology

## 2014-01-22 ENCOUNTER — Telehealth: Payer: Self-pay | Admitting: Internal Medicine

## 2014-01-22 DIAGNOSIS — K5150 Left sided colitis without complications: Secondary | ICD-10-CM

## 2014-01-22 DIAGNOSIS — K219 Gastro-esophageal reflux disease without esophagitis: Secondary | ICD-10-CM

## 2014-01-22 MED ORDER — MERCAPTOPURINE 50 MG PO TABS: ORAL_TABLET | ORAL | Status: DC

## 2014-01-22 NOTE — Telephone Encounter (Signed)
Informed pharmacy he can have one refill until he is seen in the office. Prescription sent to pharmacy.

## 2014-01-22 NOTE — Telephone Encounter (Signed)
Pt's daughter is calling in regards to pt needing a referral for dr.stark, daughter states that medicare informed him that he needs a referral, pt is completely out of meds and need to schedule an appointment with dr. Fuller Plan in order to get his rx. Pt is needing rxmercaptopurine (PURINETHOL) 50 MG tablet, daughter wants to know if dr. Arnoldo Morale can write the rx until he can get the referral and get an appointment.

## 2014-01-23 NOTE — Telephone Encounter (Signed)
Dr Fuller Plan sent this in yesterday.  No further action needed

## 2014-01-31 ENCOUNTER — Telehealth: Payer: Self-pay | Admitting: Internal Medicine

## 2014-01-31 DIAGNOSIS — K219 Gastro-esophageal reflux disease without esophagitis: Secondary | ICD-10-CM

## 2014-01-31 NOTE — Telephone Encounter (Signed)
Referral order placed.

## 2014-01-31 NOTE — Telephone Encounter (Signed)
Pt states he is needing a referral for dr. Fuller Plan, GI.

## 2014-02-12 DIAGNOSIS — I639 Cerebral infarction, unspecified: Secondary | ICD-10-CM

## 2014-02-12 HISTORY — DX: Cerebral infarction, unspecified: I63.9

## 2014-02-17 ENCOUNTER — Other Ambulatory Visit (INDEPENDENT_AMBULATORY_CARE_PROVIDER_SITE_OTHER): Payer: Commercial Managed Care - HMO

## 2014-02-17 ENCOUNTER — Ambulatory Visit (INDEPENDENT_AMBULATORY_CARE_PROVIDER_SITE_OTHER): Payer: Commercial Managed Care - HMO | Admitting: Gastroenterology

## 2014-02-17 ENCOUNTER — Encounter: Payer: Self-pay | Admitting: Gastroenterology

## 2014-02-17 ENCOUNTER — Other Ambulatory Visit: Payer: Self-pay

## 2014-02-17 VITALS — BP 120/68 | HR 64 | Ht 67.0 in | Wt 216.0 lb

## 2014-02-17 DIAGNOSIS — K5150 Left sided colitis without complications: Secondary | ICD-10-CM

## 2014-02-17 DIAGNOSIS — R748 Abnormal levels of other serum enzymes: Secondary | ICD-10-CM

## 2014-02-17 DIAGNOSIS — K219 Gastro-esophageal reflux disease without esophagitis: Secondary | ICD-10-CM | POA: Diagnosis not present

## 2014-02-17 LAB — CBC WITH DIFFERENTIAL/PLATELET
BASOS PCT: 0.5 % (ref 0.0–3.0)
Basophils Absolute: 0 10*3/uL (ref 0.0–0.1)
EOS PCT: 3 % (ref 0.0–5.0)
Eosinophils Absolute: 0.2 10*3/uL (ref 0.0–0.7)
HEMATOCRIT: 45 % (ref 39.0–52.0)
Hemoglobin: 14.9 g/dL (ref 13.0–17.0)
LYMPHS ABS: 1.7 10*3/uL (ref 0.7–4.0)
Lymphocytes Relative: 20 % (ref 12.0–46.0)
MCHC: 33.2 g/dL (ref 30.0–36.0)
MCV: 89 fl (ref 78.0–100.0)
MONO ABS: 0.9 10*3/uL (ref 0.1–1.0)
Monocytes Relative: 11.1 % (ref 3.0–12.0)
NEUTROS PCT: 65.4 % (ref 43.0–77.0)
Neutro Abs: 5.4 10*3/uL (ref 1.4–7.7)
Platelets: 240 10*3/uL (ref 150.0–400.0)
RBC: 5.06 Mil/uL (ref 4.22–5.81)
RDW: 16 % — ABNORMAL HIGH (ref 11.5–14.6)
WBC: 8.3 10*3/uL (ref 4.5–10.5)

## 2014-02-17 LAB — COMPREHENSIVE METABOLIC PANEL
ALK PHOS: 59 U/L (ref 39–117)
ALT: 31 U/L (ref 0–53)
AST: 22 U/L (ref 0–37)
Albumin: 3.7 g/dL (ref 3.5–5.2)
BILIRUBIN TOTAL: 0.7 mg/dL (ref 0.3–1.2)
BUN: 13 mg/dL (ref 6–23)
CO2: 32 mEq/L (ref 19–32)
Calcium: 9.7 mg/dL (ref 8.4–10.5)
Chloride: 100 mEq/L (ref 96–112)
Creatinine, Ser: 1.2 mg/dL (ref 0.4–1.5)
GFR: 63.01 mL/min (ref >60.00)
GLUCOSE: 86 mg/dL (ref 70–99)
Potassium: 3.6 mEq/L (ref 3.5–5.1)
Sodium: 138 mEq/L (ref 135–145)
Total Protein: 7.6 g/dL (ref 6.0–8.3)

## 2014-02-17 LAB — LIPASE: Lipase: 149 U/L — ABNORMAL HIGH (ref 11.0–59.0)

## 2014-02-17 MED ORDER — PANTOPRAZOLE SODIUM 40 MG PO TBEC: 40.0000 mg | DELAYED_RELEASE_TABLET | Freq: Every day | ORAL | Status: DC

## 2014-02-17 MED ORDER — MERCAPTOPURINE 50 MG PO TABS: ORAL_TABLET | ORAL | Status: DC

## 2014-02-17 NOTE — Progress Notes (Signed)
    History of Present Illness: This is a 68 year old male with left-sided ulcer colitis and GERD. He is no gastrointestinal complaints. Denies weight loss, abdominal pain, constipation, diarrhea, change in stool caliber, melena, hematochezia, nausea, vomiting, dysphagia, reflux symptoms, chest pain.  Current Medications, Allergies, Past Medical History, Past Surgical History, Family History and Social History were reviewed in [employer] record.  Physical Exam: General: Well developed , well nourished, no acute distress Head: Normocephalic and atraumatic Eyes:  sclerae anicteric, EOMI Ears: Normal auditory acuity Mouth: No deformity or lesions Lungs: Clear throughout to auscultation Heart: Regular rate and rhythm; no murmurs, rubs or bruits Abdomen: Soft, non tender and non distended. No masses, hepatosplenomegaly or hernias noted. Normal Bowel sounds Musculoskeletal: Symmetrical with no gross deformities  Pulses:  Normal pulses noted Extremities: No clubbing, cyanosis, edema or deformities noted Neurological: Alert oriented x 4, grossly nonfocal Psychological:  Alert and cooperative. Normal mood and affect  Assessment and Recommendations:  1. Ulcerative proctosigmoiditis under excellent control on current therapy. Standard blood work today. Continue 6-MP 75 mg daily. Followup visit in 6 months.   2. GERD. He want to change to pantoprazole as he feels he can get this less expensively than Nexium. Change to pantoprazole 40 mg daily. Intensify antireflux measures. Use TUMS as needed.

## 2014-02-17 NOTE — Patient Instructions (Signed)
Your physician has requested that you go to the basement for the following lab work before leaving today: Cmet, CBC and Lipase.  We have sent the following medications to your pharmacy for you to pick up at your convenience: 40m and pantoprazole.  Thank you for choosing me and [facility].  MPricilla Riffle SDagoberto Ligas, MD., FMarval Regal

## 2014-02-18 ENCOUNTER — Ambulatory Visit (INDEPENDENT_AMBULATORY_CARE_PROVIDER_SITE_OTHER): Payer: Medicare HMO | Admitting: Family Medicine

## 2014-02-18 ENCOUNTER — Encounter: Payer: Self-pay | Admitting: Family Medicine

## 2014-02-18 VITALS — BP 102/68 | HR 68 | Temp 98.6°F | Wt 216.0 lb

## 2014-02-18 DIAGNOSIS — J309 Allergic rhinitis, unspecified: Secondary | ICD-10-CM

## 2014-02-18 DIAGNOSIS — L570 Actinic keratosis: Secondary | ICD-10-CM

## 2014-02-18 DIAGNOSIS — M659 Synovitis and tenosynovitis, unspecified: Secondary | ICD-10-CM

## 2014-02-18 MED ORDER — HYDROCODONE-HOMATROPINE 5-1.5 MG/5ML PO SYRP: 5.0000 mL | ORAL_SOLUTION | Freq: Three times a day (TID) | ORAL | Status: DC | PRN

## 2014-02-18 MED ORDER — FLUTICASONE PROPIONATE 50 MCG/ACT NA SUSP: NASAL | Status: DC

## 2014-02-18 NOTE — Progress Notes (Signed)
Pre visit review using our clinic review tool, if applicable. No additional management support is needed unless otherwise documented below in the visit note. 

## 2014-02-18 NOTE — Progress Notes (Signed)
Chief Complaint  Patient presents with  . Allergies    cough, scratchy throat, fatigue     HPI:  Allergies -started: every spring and fall, saw allergist in past, uses flonase sometimes - not taking, zyrtec makes him tired -symptoms:nasal congestion, sneezing, PND, cough -denies:fever, SOB, NVD, tooth pain -sick contacts/travel/risks: denies flu exposure, tick exposure or or Ebola risks -Hx of: allergies  Tenosynovitis of fingers: -recurrent, wants referral to ortho   ROS: See pertinent positives and negatives per HPI.  Past Medical History  Diagnosis Date  . GERD (gastroesophageal reflux disease)   . Adenomatous polyps 03/2004  . Trigger finger (acquired)   . Coronary atherosclerosis of unspecified type of vessel, native or graft   . Hypertension   . Hyperlipidemia   . 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 Esophagitis 1993  . Sleep apnea   . Arthritis   . Myocardial infarction 1993    Past Surgical History  Procedure Laterality Date  . Cholecystectomy  2002  . Rotator cuff repair  2009    right  . Lumbar fusion  1983  . Angioplasty  1993    Family History  Problem Relation Age of Onset  . Stroke Mother   . COPD Father   . Heart disease Sister   . Colon cancer Neg Hx     History   Social History  . Marital Status: Married    Spouse Name: N/A    Number of Children: 3  . Years of Education: N/A   Occupational History  . apartment maintenance    Social History Main Topics  . Smoking status: Never Smoker   . Smokeless tobacco: Former Systems developer    Types: Chew    Quit date: 08/19/2013  . Alcohol Use: No  . Drug Use: No  . Sexual Activity: None   Other Topics Concern  . None   Social History Narrative   Pt does not get regular exercise--daily caffeine use-5 cups daily    Current outpatient prescriptions:acyclovir ointment (ZOVIRAX) 5 %, Apply topically every 3 (three) hours., Disp: 15 g, Rfl: 2;  aspirin 81 MG tablet, Take 81 mg by mouth  daily.  , Disp: , Rfl: ;  atenolol (TENORMIN) 25 MG tablet, TAKE ONE TABLET BY MOUTH EVERY DAY, Disp: 90 tablet, Rfl: 3;  atorvastatin (LIPITOR) 10 MG tablet, TAKE ONE TABLET DAILY., Disp: 90 tablet, Rfl: 3 clotrimazole-betamethasone (LOTRISONE) cream, Apply topically 2 (two) times daily., Disp: 30 g, Rfl: 0;  diphenhydramine-acetaminophen (TYLENOL PM) 25-500 MG TABS, Take 1 tablet by mouth at bedtime as needed.  , Disp: , Rfl: ;  fluticasone (FLONASE) 50 MCG/ACT nasal spray, INHALE 1 SPRAY INTO EACH NOSTRIL ONCE DAILY., Disp: 16 g, Rfl: 11 mercaptopurine (PURINETHOL) 50 MG tablet, TAKE 1 AND 1/2 TABLETS DAILY.GIVE ON AN EMPTY STOMACH 1 HOUR BEFORE OR 2 HOURS AFTER MEAL., Disp: 45 tablet, Rfl: 5;  nitroGLYCERIN (NITROSTAT) 0.4 MG SL tablet, Place 1 tablet (0.4 mg total) under the tongue every 5 (five) minutes as needed., Disp: 30 tablet, Rfl: 3;  pantoprazole (PROTONIX) 40 MG tablet, Take 1 tablet (40 mg total) by mouth daily., Disp: 30 tablet, Rfl: 5 valsartan-hydrochlorothiazide (DIOVAN HCT) 160-25 MG per tablet, 1 qd, Disp: 90 tablet, Rfl: 3;  HYDROcodone-homatropine (HYCODAN) 5-1.5 MG/5ML syrup, Take 5 mLs by mouth every 8 (eight) hours as needed for cough., Disp: 120 mL, Rfl: 0  EXAM:  Filed Vitals:   02/18/14 1539  BP: 102/68  Pulse: 68  Temp: 98.6 F (37 C)  Body mass index is 33.82 kg/(m^2).  GENERAL: vitals reviewed and listed above, alert, oriented, appears well hydrated and in no acute distress  HEENT: atraumatic, conjunttiva clear, no obvious abnormalities on inspection of external nose and ears, normal appearance of ear canals and TMs, clear nasal congestion, mild post oropharyngeal erythema with PND, no tonsillar edema or exudate, no sinus TTP  NECK: no obvious masses on inspection  LUNGS: clear to auscultation bilaterally, no wheezes, rales or rhonchi, good air movement  CV: HRRR, no peripheral edema  MS: moves all extremities without noticeable abnormality  SKIN: 2 AKs  of L ear  PSYCH: pleasant and cooperative, no obvious depression or anxiety  ASSESSMENT AND PLAN:  Discussed the following assessment and plan:  Allergic rhinitis - Plan: HYDROcodone-homatropine (HYCODAN) 5-1.5 MG/5ML syrup, fluticasone (FLONASE) 50 MCG/ACT nasal spray -flonase, daily antihistamine, short term hydromet - risks and return precuations discussed  Tenosynovitis - Plan: Ambulatory referral to Orthopedic Surgery, CANCELED: Ambulatory referral to Orthopedic Surgery -referral per pt  Preference for recurrent tenosynovitis  AK (actinic keratosis) -x2 L ear, discussed risks and tx - LN2 x2 with return precuations, tol well -follow up 1 month to recheck  -of course, we advised to return or notify a doctor immediately if symptoms worsen or persist or new concerns arise.    Patient Instructions  -flonase 2 sprays each nostril daily for one month, then 1 spray each nostril daily  -claritin or allegra once daily  -follow up if lesions on ears persist  -We placed a referral for you as discussed to the orthopedic doctor. It usually takes about 1-2 weeks to process and schedule this referral. If you have not heard from [place] regarding this appointment in 2 weeks please contact our office.       Colin Benton R.

## 2014-02-18 NOTE — Patient Instructions (Signed)
-  flonase 2 sprays each nostril daily for one month, then 1 spray each nostril daily  -claritin or allegra once daily  -follow up if lesions on ears persist  -We placed a referral for you as discussed to the orthopedic doctor. It usually takes about 1-2 weeks to process and schedule this referral. If you have not heard from [place] regarding this appointment in 2 weeks please contact our office.

## 2014-02-27 ENCOUNTER — Encounter (HOSPITAL_COMMUNITY): Payer: Self-pay | Admitting: Emergency Medicine

## 2014-02-27 ENCOUNTER — Emergency Department (HOSPITAL_COMMUNITY)
Admission: EM | Admit: 2014-02-27 | Discharge: 2014-02-27 | Disposition: A | Discharge status: Home / Self Care | Payer: Medicare HMO | Attending: Emergency Medicine | Admitting: Emergency Medicine

## 2014-02-27 ENCOUNTER — Emergency Department (HOSPITAL_COMMUNITY): Payer: Medicare HMO

## 2014-02-27 DIAGNOSIS — Z7982 Long term (current) use of aspirin: Secondary | ICD-10-CM | POA: Insufficient documentation

## 2014-02-27 DIAGNOSIS — I252 Old myocardial infarction: Secondary | ICD-10-CM | POA: Insufficient documentation

## 2014-02-27 DIAGNOSIS — Z9861 Coronary angioplasty status: Secondary | ICD-10-CM | POA: Insufficient documentation

## 2014-02-27 DIAGNOSIS — I2510 Atherosclerotic heart disease of native coronary artery without angina pectoris: Secondary | ICD-10-CM | POA: Insufficient documentation

## 2014-02-27 DIAGNOSIS — R42 Dizziness and giddiness: Secondary | ICD-10-CM | POA: Insufficient documentation

## 2014-02-27 DIAGNOSIS — K219 Gastro-esophageal reflux disease without esophagitis: Secondary | ICD-10-CM | POA: Insufficient documentation

## 2014-02-27 DIAGNOSIS — I10 Essential (primary) hypertension: Secondary | ICD-10-CM | POA: Insufficient documentation

## 2014-02-27 DIAGNOSIS — R5381 Other malaise: Secondary | ICD-10-CM | POA: Insufficient documentation

## 2014-02-27 DIAGNOSIS — Z880 Allergy status to penicillin: Secondary | ICD-10-CM | POA: Insufficient documentation

## 2014-02-27 DIAGNOSIS — J329 Chronic sinusitis, unspecified: Secondary | ICD-10-CM

## 2014-02-27 DIAGNOSIS — Z79899 Other long term (current) drug therapy: Secondary | ICD-10-CM | POA: Insufficient documentation

## 2014-02-27 DIAGNOSIS — R5383 Other fatigue: Secondary | ICD-10-CM

## 2014-02-27 DIAGNOSIS — IMO0002 Reserved for concepts with insufficient information to code with codable children: Secondary | ICD-10-CM | POA: Insufficient documentation

## 2014-02-27 LAB — CBC WITH DIFFERENTIAL/PLATELET
BASOS PCT: 1 % (ref 0–1)
Basophils Absolute: 0 10*3/uL (ref 0.0–0.1)
EOS ABS: 0.3 10*3/uL (ref 0.0–0.7)
Eosinophils Relative: 4 % (ref 0–5)
HCT: 43.8 % (ref 39.0–52.0)
Hemoglobin: 15 g/dL (ref 13.0–17.0)
Lymphocytes Relative: 27 % (ref 12–46)
Lymphs Abs: 2 10*3/uL (ref 0.7–4.0)
MCH: 29.5 pg (ref 26.0–34.0)
MCHC: 34.2 g/dL (ref 30.0–36.0)
MCV: 86.1 fL (ref 78.0–100.0)
Monocytes Absolute: 0.6 10*3/uL (ref 0.1–1.0)
Monocytes Relative: 9 % (ref 3–12)
NEUTROS PCT: 59 % (ref 43–77)
Neutro Abs: 4.4 10*3/uL (ref 1.7–7.7)
Platelets: 247 10*3/uL (ref 150–400)
RBC: 5.09 MIL/uL (ref 4.22–5.81)
RDW: 14.6 % (ref 11.5–15.5)
WBC: 7.3 10*3/uL (ref 4.0–10.5)

## 2014-02-27 LAB — COMPREHENSIVE METABOLIC PANEL
ALBUMIN: 3.6 g/dL (ref 3.5–5.2)
ALT: 33 U/L (ref 0–53)
AST: 27 U/L (ref 0–37)
Alkaline Phosphatase: 72 U/L (ref 39–117)
BUN: 11 mg/dL (ref 6–23)
CO2: 28 mEq/L (ref 19–32)
Calcium: 9.8 mg/dL (ref 8.4–10.5)
Chloride: 98 mEq/L (ref 96–112)
Creatinine, Ser: 1.16 mg/dL (ref 0.50–1.35)
GFR calc Af Amer: 74 mL/min — ABNORMAL LOW (ref >90)
GFR calc non Af Amer: 64 mL/min — ABNORMAL LOW (ref >90)
Glucose, Bld: 158 mg/dL — ABNORMAL HIGH (ref 70–99)
POTASSIUM: 3.2 meq/L — AB (ref 3.7–5.3)
Sodium: 140 mEq/L (ref 137–147)
TOTAL PROTEIN: 7.9 g/dL (ref 6.0–8.3)
Total Bilirubin: 0.4 mg/dL (ref 0.3–1.2)

## 2014-02-27 MED ORDER — LEVOFLOXACIN 500 MG PO TABS
500.0000 mg | ORAL_TABLET | Freq: Once | ORAL | Status: AC
  Administered 2014-02-27: 500 mg via ORAL
  Filled 2014-02-27: qty 1

## 2014-02-27 MED ORDER — LEVOFLOXACIN 500 MG PO TABS: 500.0000 mg | ORAL_TABLET | Freq: Every day | ORAL | Status: DC

## 2014-02-27 NOTE — ED Provider Notes (Signed)
CSN: [id number]     Arrival date & time 02/27/14  2050 History  This chart was scribed for [name] Diego, MD by Zettie Pho, ED Scribe. This patient was seen in room APA18/APA18 and the patient's care was started at 9:13 PM.    No chief complaint on file.  Patient is a 68 y.o. male presenting with weakness. The history is provided by the patient. No language interpreter was used.  Weakness This is a new problem. The current episode started 12 to 24 hours ago. The problem occurs constantly. The problem has not changed since onset.Associated symptoms include headaches (resolved). Pertinent negatives include no chest pain. Nothing aggravates the symptoms. Nothing relieves the symptoms. He has tried nothing for the symptoms.   HPI Comments: Tore Carreker is a 68 y.o. male who presents to the Emergency Department complaining of blurred vision in the bilateral eyes with an associated pressure-like sensation behind the eyes and intermittent dizziness onset this morning, about 12 hours ago. Patient reports an associated, diffuse headache yesterday, but denies any today. He states that these types of symptoms are new for him.  Patient reports that he recently started taking Flonase, Hycodan, and Claritin, but has taken these medications before without similar issues. He reports that he was also recently switched from Nexium to Protonix and has not taken Protonix in the past. Patient has a history of coronary atherosclerosis, HTN, hyperlipidemia, chronic ulcerative proctosigmoiditis, esophagitis, GERD, sleep apnea, arthritis, and MI.   Past Medical History  Diagnosis Date  . GERD (gastroesophageal reflux disease)   . Adenomatous polyps 03/2004  . Trigger finger (acquired)   . Coronary atherosclerosis of unspecified type of vessel, native or graft   . Hypertension   . Hyperlipidemia   . 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 Esophagitis 1993  . Sleep apnea   . Arthritis   . Myocardial  infarction 1993   Past Surgical History  Procedure Laterality Date  . Cholecystectomy  2002  . Rotator cuff repair  2009    right  . Lumbar fusion  1983  . Angioplasty  1993   Family History  Problem Relation Age of Onset  . Stroke Mother   . COPD Father   . Heart disease Sister   . Colon cancer Neg Hx    History  Substance Use Topics  . Smoking status: Never Smoker   . Smokeless tobacco: Former Systems developer    Types: Chew    Quit date: 08/19/2013  . Alcohol Use: No    Review of Systems  Constitutional: Negative for appetite change.  HENT: Negative for ear discharge.   Eyes: Positive for visual disturbance.  Cardiovascular: Negative for chest pain.  Genitourinary: Negative for frequency and hematuria.  Skin: Negative for rash.  Neurological: Positive for dizziness, weakness and headaches (resolved).  Psychiatric/Behavioral: Negative for hallucinations.    Allergies  Penicillins  Home Medications   Prior to Admission medications   Medication Sig Start Date End Date Taking? Authorizing Provider  acyclovir ointment (ZOVIRAX) 5 % Apply topically every 3 (three) hours. 04/30/13  Yes Ricard Dillon, MD  aspirin 81 MG tablet Take 81 mg by mouth daily.     Yes Historical Provider, MD  atenolol (TENORMIN) 25 MG tablet TAKE ONE TABLET BY MOUTH EVERY DAY 07/11/13  Yes Ricard Dillon, MD  atorvastatin (LIPITOR) 10 MG tablet TAKE ONE TABLET DAILY. 08/02/13  Yes Ricard Dillon, MD  clotrimazole-betamethasone (LOTRISONE) cream Apply topically 2 (two) times daily. 12/10/12  Yes Ricard Dillon, MD  diphenhydramine-acetaminophen (TYLENOL PM) 25-500 MG TABS Take 1 tablet by mouth at bedtime as needed.     Yes Historical Provider, MD  fluticasone (FLONASE) 50 MCG/ACT nasal spray INHALE 1 SPRAY INTO EACH NOSTRIL ONCE DAILY. 02/18/14  Yes Lucretia Kern, DO  HYDROcodone-homatropine (HYCODAN) 5-1.5 MG/5ML syrup Take 5 mLs by mouth every 8 (eight) hours as needed for cough. 02/18/14  Yes Lucretia Kern, DO   mercaptopurine (PURINETHOL) 50 MG tablet TAKE 1 AND 1/2 TABLETS DAILY.GIVE ON AN EMPTY STOMACH 1 HOUR BEFORE OR 2 HOURS AFTER MEAL. 02/17/14  Yes Ladene Artist, MD  nitroGLYCERIN (NITROSTAT) 0.4 MG SL tablet Place 1 tablet (0.4 mg total) under the tongue every 5 (five) minutes as needed. 01/20/12  Yes Ricard Dillon, MD  pantoprazole (PROTONIX) 40 MG tablet Take 1 tablet (40 mg total) by mouth daily. 02/17/14  Yes Ladene Artist, MD  valsartan-hydrochlorothiazide (DIOVAN HCT) 160-25 MG per tablet 1 qd 04/03/13  Yes Ricard Dillon, MD   Triage Vitals: BP 155/77  Pulse 72  Temp(Src) 98.3 F (36.8 C)  Resp 16  Ht 5' 7"  (1.702 m)  Wt 210 lb (95.255 kg)  BMI 32.88 kg/m2  SpO2 99%  Physical Exam  Constitutional: He is oriented to person, place, and time. He appears well-developed.  HENT:  Head: Normocephalic.  Eyes: Conjunctivae and EOM are normal. No scleral icterus.  Neck: Neck supple. No thyromegaly present.  Cardiovascular: Normal rate and regular rhythm.  Exam reveals no gallop and no friction rub.   No murmur heard. Pulmonary/Chest: No stridor. He has no wheezes. He has no rales. He exhibits no tenderness.  Abdominal: He exhibits no distension. There is no tenderness. There is no rebound.  Musculoskeletal: Normal range of motion. He exhibits no edema.  Lymphadenopathy:    He has no cervical adenopathy.  Neurological: He is oriented to person, place, and time. He exhibits normal muscle tone. Coordination normal.  Normal strength and sensation throughout.   Skin: No rash noted. No erythema.  Psychiatric: He has a normal mood and affect. His behavior is normal.    ED Course  Procedures (including critical care time)  DIAGNOSTIC STUDIES: Oxygen Saturation is 99% on room air, normal by my interpretation.    COORDINATION OF CARE: 9:17 PM- Ordered CBC, CMP, and a CT of the head. Discussed treatment plan with patient at bedside and patient verbalized agreement.     Labs  Review Labs Reviewed  COMPREHENSIVE METABOLIC PANEL - Abnormal; Notable for the following:    Potassium 3.2 (*)    Glucose, Bld 158 (*)    GFR calc non Af Amer 64 (*)    GFR calc Af Amer 74 (*)    All other components within normal limits  CBC WITH DIFFERENTIAL    Imaging Review Ct Head Wo Contrast  02/27/2014   CLINICAL DATA:  One day history of blurred vision; history of coronary artery disease and hypertension.  EXAM: CT HEAD WITHOUT CONTRAST  TECHNIQUE: Contiguous axial images were obtained from the base of the skull through the vertex without intravenous contrast.  COMPARISON:  None.  FINDINGS: The ventricles are normal in size and position. There is no intracranial hemorrhage nor intracranial mass effect. There is no evidence of an evolving ischemic infarction. The cerebellum and brainstem exhibit no acute abnormalities. The visualized portions of the orbits exhibit no acute abnormalities.  At bone window settings there is an air-fluid level in the left  maxillary sinus. There is mucoperiosteal thickening within the left frontal and both right and left ethmoid sinuses and within the sphenoid sinuses. The mastoid air cells are well pneumatized. There is no acute bony abnormality of the calvarium.  IMPRESSION: 1. There is no evidence of an acute ischemic or hemorrhagic event within the brain. There is no intracranial mass effect or hydrocephalus. 2. There is pan sinus inflammation. This maybe related to the patient's symptoms.   Electronically Signed   By: David  Martinique   On: 02/27/2014 21:58     EKG Interpretation None      MDM   Final diagnoses:  None    The chart was scribed for me under my direct supervision.  I personally performed the history, physical, and medical decision making and all procedures in the evaluation of this patient.[name] Diego, MD 02/27/14 [phone]

## 2014-02-27 NOTE — ED Notes (Signed)
Pt reporting blurred vision starting this morning and pressure behind his eyes.  Pt states he didn't have a headache today, but did yesterday.  Reporting mild occasional dizziness.

## 2014-02-27 NOTE — Discharge Instructions (Signed)
Follow up with your family md next week for recheck

## 2014-03-03 ENCOUNTER — Telehealth: Payer: Self-pay | Admitting: Internal Medicine

## 2014-03-03 NOTE — Telephone Encounter (Signed)
Appt scheduled for pt. 03/04/14

## 2014-03-03 NOTE — Telephone Encounter (Signed)
Yes, this will be ok.

## 2014-03-03 NOTE — Telephone Encounter (Signed)
With matt

## 2014-03-03 NOTE — Telephone Encounter (Signed)
Pt is needing a post er fu this week with dr. Arnoldo Morale, pt was seen for blurred vision and states his condition has not gotten any better. Pt is trying to see if dr. Arnoldo Morale can see him today or sometime this week.

## 2014-03-03 NOTE — Telephone Encounter (Signed)
Catalina Antigua is this ok to sch with you this week

## 2014-03-04 ENCOUNTER — Encounter: Payer: Self-pay | Admitting: Physician Assistant

## 2014-03-04 ENCOUNTER — Ambulatory Visit (INDEPENDENT_AMBULATORY_CARE_PROVIDER_SITE_OTHER): Payer: Medicare HMO | Admitting: Physician Assistant

## 2014-03-04 VITALS — BP 110/62 | HR 59 | Temp 97.9°F | Resp 16 | Ht 67.0 in | Wt 214.4 lb

## 2014-03-04 DIAGNOSIS — H532 Diplopia: Secondary | ICD-10-CM

## 2014-03-04 DIAGNOSIS — H538 Other visual disturbances: Secondary | ICD-10-CM

## 2014-03-04 DIAGNOSIS — R42 Dizziness and giddiness: Secondary | ICD-10-CM

## 2014-03-04 NOTE — Patient Instructions (Addendum)
Continue Flonase and Claritin for nasal allergy symptoms.  Continue taking Levaquin as prescribed.  You will be called to schedule an appointment with neurology.  Followup the symptoms worsen or do not improve despite treatment.  Diplopia  Double vision (diplopia) means that you are seeing two of everything. Diplopia usually occurs with both eyes open, and may be worse when looking in one particular direction. If both eyes must be open to see double, this is called binocular diplopia. If double images are seen in just one eye, this is called monocular diplopia.  CAUSES  Binocular Diplopia  Disorder affecting the muscles that move the eyes or the nerves that control those muscles.  Tumor or other mass pushing on an eye from beside or behind the eye.  Myasthenia gravis. This is a neuromuscular illness that causes the body's muscles to tire easily. The eye muscles and eyelid muscles become weak. The eyes do not track together well.  Grave's disease. This is an overactivity of the thyroid gland. This condition causes swelling of tissues around the eyes. This produces a bulging out of the eyeball.  Blowout fracture of the bone around the eye. The muscles of the eye socket are damaged. This often happens when the eye is hit with force.  Complications after certain surgeries, such as a lens implant after cataract surgery.  Fluid-filled mass (abscess) behind or beside the eye, infection, or abnormal connection between arteries and veins. Sometimes, no cause is found. Monocular Diplopia  Problems with corrective lens or contacts.  Corneal abrasion, infection, severe injury, or bulging and irregularity of the corneal surface (keratoconus).  Irregularities of the pupil from drugs, severe injury, or other causes.  Problems involving the lens of the eye, such as opacities or cataracts.  Complications after certain surgeries, such as a lens implant after cataract surgery.  Retinal detachment  or problems involving the blood vessels of the retina. Sometimes, no cause is found. SYMPTOMS  Binocular Diplopia  When one eye is closed or covered, the double images disappear. Monocular Diplopia  When the unaffected eye is closed or covered, the double images remain. The double images disappear when the affected eye is closed or covered. DIAGNOSIS  A diagnosis is made during an eye exam. TREATMENT  Treatment depends on the cause or underlying disease.   Relief of double vision symptoms may be achieved by patching one eye or by using special glasses.  Surgery on the muscles of the eye may be needed. SEEK IMMEDIATE MEDICAL CARE IF:   You see two images of a single object you are looking at, either with both eyes open or with just one eye open. Document Released: 09/01/2004 Document Revised: 01/23/2012 Document Reviewed: 06/18/2008 [facility] Patient Information 2014 [place].

## 2014-03-04 NOTE — Progress Notes (Signed)
Pre visit review using our clinic review tool, if applicable. No additional management support is needed unless otherwise documented below in the visit note. 

## 2014-03-04 NOTE — Progress Notes (Signed)
Subjective:    Patient ID: [name], male    DOB: 1946/04/09, 68 y.o.   MRN: [id number]  HPI Patient is a 52 Caucasian male presenting for followup of an emergency room visit on 02/27/2014. He had complaints of dizziness, blurred vision, double vision. He had a CT scan done through the emergency department which showed no intracranial mass or mass effect, and no ischemic appearance, however did demonstrate pansinus inflammation. He was prescribed Levaquin and told to followup with his primary care provider the next week. Today he presents with the same symptoms of dizziness, blurred vision, and diplopia, he is on his fifth day of Levaquin and his symptoms have not improved. He is also having some clear rhinorrhea due to allergies, however he denies fevers, chills, shortness of breath, cough, otalgia, nausea, vomiting, and diarrhea.  Emergency department note by Dr. Milton Ferguson on 02/27/2014 reviewed.  CT scan report by Dr. David Martinique on 02/27/2014 reviewed.  Medications from emergency department visit reconciled.  Review of Systems As per the history of present illness and are otherwise negative  Past Medical History  Diagnosis Date  . GERD (gastroesophageal reflux disease)   . Adenomatous polyps 03/2004  . Trigger finger (acquired)   . Coronary atherosclerosis of unspecified type of vessel, native or graft   . Hypertension   . Hyperlipidemia   . 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 Esophagitis 1993  . Sleep apnea   . Arthritis   . Myocardial infarction 1993   Past Surgical History  Procedure Laterality Date  . Cholecystectomy  2002  . Rotator cuff repair  2009    right  . Lumbar fusion  1983  . Angioplasty  1993    reports that he has never smoked. He quit smokeless tobacco use about 6 months ago. His smokeless tobacco use included Chew. He reports that he does not drink alcohol or use illicit drugs. family history includes COPD in his father; Heart  disease in his sister; Stroke in his mother. There is no history of Colon cancer. Allergies  Allergen Reactions  . Penicillins     REACTION: rash    No change in PFS history since last office visit on 02/18/2014    Objective:   Physical Exam  Nursing note and vitals reviewed. Constitutional: He is oriented to person, place, and time. He appears well-developed and well-nourished. No distress.  HENT:  Head: Normocephalic and atraumatic.  Right Ear: External ear normal.  Left Ear: External ear normal.  Nose: Nose normal.  Mouth/Throat: Oropharynx is clear and moist. No oropharyngeal exudate.  Left tympanic membrane nonerythematous however demonstrates air fluid levels. Right tympanic membrane appears normal.  Bilateral frontal and maxillary sinuses nontender to palpation.  Eyes: Conjunctivae are normal. Pupils are equal, round, and reactive to light. Right eye exhibits no discharge. Left eye exhibits no discharge. No scleral icterus.  EOM are not entirely symmetrical, right eye appears to drag a little behind left eye with horizontal movement.  Vision exam 20/20 in right eye and left eye when opposite eye is covered with prescription glasses on, however with both eyes uncovered, diplopia prevents vision exam.  Neck: Normal range of motion. Neck supple. No tracheal deviation present. No thyromegaly present.  Cardiovascular: Normal rate, regular rhythm, normal heart sounds and intact distal pulses.  Exam reveals no gallop and no friction rub.   No murmur heard. Pulmonary/Chest: Effort normal and breath sounds normal. No stridor. No respiratory distress. He has no wheezes. He has  no rales. He exhibits no tenderness.  Abdominal: Soft. Bowel sounds are normal. There is no tenderness.  Musculoskeletal: Normal range of motion. He exhibits no edema and no tenderness.  Lymphadenopathy:    He has no cervical adenopathy.  Neurological: He is alert and oriented to person, place, and time. He has  normal reflexes. He displays normal reflexes. No cranial nerve deficit. He exhibits normal muscle tone. Coordination normal.  Negative Dix-Hallpike  Romberg normal  Normal Weber and Rhine exam  Skin: Skin is warm and dry. No rash noted. He is not diaphoretic. No erythema. No pallor.  Psychiatric: He has a normal mood and affect. His behavior is normal. Judgment and thought content normal.    Filed Vitals:   03/04/14 0925  BP: 110/62  Pulse: 59  Temp: 97.9 F (36.6 C)  Resp: 16    Lab Results  Component Value Date   WBC 7.3 02/27/2014   HGB 15.0 02/27/2014   HCT 43.8 02/27/2014   PLT 247 02/27/2014   GLUCOSE 158* 02/27/2014   CHOL 112 04/05/2013   TRIG 74.0 04/05/2013   HDL 27.90* 04/05/2013   LDLCALC 69 04/05/2013   ALT 33 02/27/2014   AST 27 02/27/2014   NA 140 02/27/2014   K 3.2* 02/27/2014   CL 98 02/27/2014   CREATININE 1.16 02/27/2014   BUN 11 02/27/2014   CO2 28 02/27/2014   TSH 0.98 04/05/2013   PSA 0.84 04/05/2013     EXAM:  CT HEAD WITHOUT CONTRAST  TECHNIQUE:  Contiguous axial images were obtained from the base of the skull  through the vertex without intravenous contrast.  COMPARISON: None.  FINDINGS:  The ventricles are normal in size and position. There is no  intracranial hemorrhage nor intracranial mass effect. There is no  evidence of an evolving ischemic infarction. The cerebellum and  brainstem exhibit no acute abnormalities. The visualized portions of  the orbits exhibit no acute abnormalities.  At bone window settings there is an air-fluid level in the left  maxillary sinus. There is mucoperiosteal thickening within the left  frontal and both right and left ethmoid sinuses and within the  sphenoid sinuses. The mastoid air cells are well pneumatized. There  is no acute bony abnormality of the calvarium.  IMPRESSION:  1. There is no evidence of an acute ischemic or hemorrhagic event  within the brain. There is no intracranial mass effect or    hydrocephalus.  2. There is pan sinus inflammation. This maybe related to the  patient's symptoms.      Assessment & Plan:  Edgar was seen today for no specified reason.  Diagnoses and associated orders for this visit:  Diplopia - Ambulatory referral to Neurology  Blurred vision - Ambulatory referral to Neurology  Dizziness and giddiness - Ambulatory referral to Neurology    Patient Instructions  Continue Flonase and Claritin for nasal allergy symptoms.  Continue taking Levaquin as prescribed.  You will be called to schedule an appointment with neurology.  Followup the symptoms worsen or do not improve despite treatment.

## 2014-03-05 ENCOUNTER — Telehealth: Payer: Self-pay | Admitting: Internal Medicine

## 2014-03-05 NOTE — Telephone Encounter (Signed)
Pt was seen yesterday with PA tucker. Pt has appt sch with dr Tomi Likens on 04-02-14. Pt wife call dr Tomi Likens office and the only way pt can be seen sooner is if PA call MD personally. Pt is having double vision etc

## 2014-03-05 NOTE — Telephone Encounter (Signed)
Pt is aware referral has been sent.

## 2014-03-05 NOTE — Telephone Encounter (Signed)
Pt is aware on appt with dr tat on 03-07-14 at 12:30pm. Pt needs a referral to dr tat. Pt has [employer]

## 2014-03-05 NOTE — Telephone Encounter (Signed)
Please call Pt back. Let him know that Dr. Tomi Likens is out of town this week, however he needs to be seen sooner. He has an appointment scheduled this Friday at [facility] with Dr. Carles Collet at 1:00PM. He needs to arrive at 12:30 if possible for paperwork.

## 2014-03-05 NOTE — Telephone Encounter (Signed)
Pt states that dr. Berline Lopes had given him a referral to see dr. Carles Collet however his insurance company states that the referral has to come from his primary doctor. The appointment has been made for 03/07/14 12:30. Pt is requesting that dr. Arnoldo Morale get the referral done for him so he can keep his appt.

## 2014-03-07 ENCOUNTER — Ambulatory Visit (HOSPITAL_COMMUNITY)
Admission: RE | Admit: 2014-03-07 | Discharge: 2014-03-07 | Disposition: A | Discharge status: Home / Self Care | Payer: Medicare HMO | Source: Ambulatory Visit | Attending: Neurology | Admitting: Neurology

## 2014-03-07 ENCOUNTER — Other Ambulatory Visit: Payer: Self-pay | Admitting: Neurology

## 2014-03-07 ENCOUNTER — Ambulatory Visit (HOSPITAL_COMMUNITY): Payer: Medicare HMO

## 2014-03-07 ENCOUNTER — Telehealth: Payer: Self-pay | Admitting: Internal Medicine

## 2014-03-07 ENCOUNTER — Telehealth: Payer: Self-pay | Admitting: Neurology

## 2014-03-07 ENCOUNTER — Ambulatory Visit (INDEPENDENT_AMBULATORY_CARE_PROVIDER_SITE_OTHER): Payer: Medicare HMO | Admitting: Neurology

## 2014-03-07 ENCOUNTER — Ambulatory Visit (HOSPITAL_COMMUNITY): Admission: RE | Admit: 2014-03-07 | Payer: Medicare HMO | Source: Ambulatory Visit

## 2014-03-07 ENCOUNTER — Encounter: Payer: Self-pay | Admitting: Neurology

## 2014-03-07 VITALS — BP 110/74 | HR 66 | Wt 214.0 lb

## 2014-03-07 DIAGNOSIS — J321 Chronic frontal sinusitis: Secondary | ICD-10-CM | POA: Insufficient documentation

## 2014-03-07 DIAGNOSIS — I6782 Cerebral ischemia: Secondary | ICD-10-CM

## 2014-03-07 DIAGNOSIS — R5383 Other fatigue: Secondary | ICD-10-CM

## 2014-03-07 DIAGNOSIS — R5381 Other malaise: Secondary | ICD-10-CM

## 2014-03-07 DIAGNOSIS — I6789 Other cerebrovascular disease: Secondary | ICD-10-CM

## 2014-03-07 DIAGNOSIS — J322 Chronic ethmoidal sinusitis: Secondary | ICD-10-CM | POA: Insufficient documentation

## 2014-03-07 DIAGNOSIS — R739 Hyperglycemia, unspecified: Secondary | ICD-10-CM

## 2014-03-07 DIAGNOSIS — R2689 Other abnormalities of gait and mobility: Secondary | ICD-10-CM

## 2014-03-07 DIAGNOSIS — R29818 Other symptoms and signs involving the nervous system: Secondary | ICD-10-CM

## 2014-03-07 DIAGNOSIS — H532 Diplopia: Secondary | ICD-10-CM

## 2014-03-07 DIAGNOSIS — R7309 Other abnormal glucose: Secondary | ICD-10-CM

## 2014-03-07 DIAGNOSIS — E876 Hypokalemia: Secondary | ICD-10-CM

## 2014-03-07 DIAGNOSIS — H538 Other visual disturbances: Secondary | ICD-10-CM | POA: Insufficient documentation

## 2014-03-07 DIAGNOSIS — G319 Degenerative disease of nervous system, unspecified: Secondary | ICD-10-CM | POA: Insufficient documentation

## 2014-03-07 LAB — POTASSIUM: POTASSIUM: 4.1 meq/L (ref 3.5–5.3)

## 2014-03-07 LAB — HEMOGLOBIN A1C
Hgb A1c MFr Bld: 6.1 % — ABNORMAL HIGH (ref <5.7)
MEAN PLASMA GLUCOSE: 128 mg/dL — AB (ref <117)

## 2014-03-07 MED ORDER — CLOPIDOGREL BISULFATE 75 MG PO TABS: 75.0000 mg | ORAL_TABLET | Freq: Every day | ORAL | Status: DC

## 2014-03-07 NOTE — Telephone Encounter (Signed)
Dr. Doristine Devoid office called.  Pt has [employer] and needs a referral to an Opthalmologist for diplopia. Dr. Carles Collet would like for pt to be seen today.

## 2014-03-07 NOTE — Patient Instructions (Addendum)
1. STOP aspirin. Start Plavix 75 mg tablets to take once daily. This prescription has been sent to your pharmacy.  2. Go to [facility] hospital radiology TODAY. They are going to work you into their schedule.  3. Your provider has requested that you have labwork completed today. Please go to [facility] on the first floor of this building before leaving the office today. 4. We have you scheduled for your Carotid Doppler on 03/12/14 at 10:00 am. Please arrive 15 minutes prior and go to [facility], section A. If this is not a good date/time please call [phone] to reschedule.  5. We have you scheduled at [facility]. They are located at [address], [place], [place] [address]. They can be contacted at [phone]. We will call you with an appt.

## 2014-03-07 NOTE — Telephone Encounter (Signed)
Patient is aware that MRI look normal per Dr Carles Collet

## 2014-03-07 NOTE — Telephone Encounter (Signed)
Susie,  Could you please let the pt know that his MRI from today looked great!

## 2014-03-07 NOTE — Addendum Note (Signed)
Addended byAnnamaria Helling on: 03/07/2014 01:33 PM   Modules accepted: Orders

## 2014-03-07 NOTE — Progress Notes (Signed)
[name] was seen today in neurologic consultation at the request of Georgetta Haber, MD.  The consultation is for the evaluation of diplopia.  The records that were made available to me were reviewed.  This patient is accompanied in the office by his spouse who supplements the history.   The patient is a 68 y.o. year old R handed W male with a history of visual changes.  I reviewed emergency records from April 16.  According to emergency room records the patient was complaining of "blurred vision" and weakness and dizziness for approximately 12 hours.  A CT of the brain was performed on 02/27/2014.  Pt states early in the day, before he went to the ED, he took the dog outside and noted blurry vision, although not really double.  That day, he denied diplopia but his wife recalls that he was watching a TV show and he leaned his head to the right and he had diplopia.  I reviewed these films myself.  This is unremarkable, with the exception of pansinusitis.  He was placed on levaquin for that.  He f/u with Bebe Liter at the PCP office and a referral was made here.  Currently, we are 8 days out from the onset, and sx's are the same.  If he tilts the head to the right, he has the diplopia.  If he closes an eye (either eye), it gets better.  No ptosis.  No headache.  No DM.  No difficulty walking.  No lateralizing paresthesias or weakness, unless sleeps wrong on the R arm.  No hx of similar.  No SOB.  No difficulty climbing stairs.  Is on ASA 81 mg and has been on that for a long time.   PREVIOUS MEDICATIONS: n/a  ALLERGIES:   Allergies  Allergen Reactions  . Penicillins     REACTION: rash    CURRENT MEDICATIONS:  Current Outpatient Prescriptions on File Prior to Visit  Medication Sig Dispense Refill  . aspirin EC 81 MG tablet Take 81 mg by mouth daily.      Marland Kitchen atenolol (TENORMIN) 25 MG tablet Take 25 mg by mouth daily.      Marland Kitchen atorvastatin (LIPITOR) 10 MG tablet Take 10 mg by mouth at  bedtime.      . clotrimazole-betamethasone (LOTRISONE) cream Apply topically 2 (two) times daily.  30 g  0  . diphenhydramine-acetaminophen (TYLENOL PM) 25-500 MG TABS Take 1 tablet by mouth at bedtime as needed. sleep      . fluticasone (FLONASE) 50 MCG/ACT nasal spray Place 1 spray into both nostrils daily.      Marland Kitchen levofloxacin (LEVAQUIN) 500 MG tablet Take 1 tablet (500 mg total) by mouth daily.  10 tablet  0  . loratadine (CLARITIN) 10 MG tablet Take 10 mg by mouth daily.      . mercaptopurine (PURINETHOL) 50 MG tablet Take 75 mg by mouth daily. Give on an empty stomach 1 hour before or 2 hours after meals. Caution: Chemotherapy.      . nitroGLYCERIN (NITROSTAT) 0.4 MG SL tablet Place 1 tablet (0.4 mg total) under the tongue every 5 (five) minutes as needed.  30 tablet  3  . pantoprazole (PROTONIX) 40 MG tablet Take 1 tablet (40 mg total) by mouth daily.  30 tablet  5  . valsartan-hydrochlorothiazide (DIOVAN-HCT) 160-25 MG per tablet Take 1 tablet by mouth daily.       No current facility-administered medications on file prior to visit.  PAST MEDICAL HISTORY:   Past Medical History  Diagnosis Date  . GERD (gastroesophageal reflux disease)   . Adenomatous polyps 03/2004  . Trigger finger (acquired)   . Coronary atherosclerosis of unspecified type of vessel, native or graft   . Hypertension   . Hyperlipidemia   . 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 Esophagitis 1993  . Sleep apnea   . Arthritis   . Myocardial infarction 1993    PAST SURGICAL HISTORY:   Past Surgical History  Procedure Laterality Date  . Cholecystectomy  2002  . Rotator cuff repair  2009    right  . Lumbar fusion  1983  . Angioplasty  1993    SOCIAL HISTORY:   History   Social History  . Marital Status: Married    Spouse Name: N/A    Number of Children: 3  . Years of Education: N/A   Occupational History  . apartment maintenance     retired   Social History Main Topics  . Smoking  status: Former Research scientist (life sciences)  . Smokeless tobacco: Former Systems developer    Types: Chew     Comment: quit 50 years ago  . Alcohol Use: No  . Drug Use: No  . Sexual Activity: Not on file   Other Topics Concern  . Not on file   Social History Narrative   Pt does not get regular exercise--daily caffeine use-5 cups daily    FAMILY HISTORY:   Family Status  Relation Status Death Age  . Mother Deceased     CVA  . Father Deceased     COPD  . Brother Alive     CAD  . Sister Deceased     CAD  . Child Alive     3, one with MS    ROS:  A complete 10 system review of systems was obtained and was unremarkable apart from what is mentioned above.  PHYSICAL EXAMINATION:    VITALS:   Filed Vitals:   03/07/14 0841  BP: 110/74  Pulse: 66  Weight: 214 lb (97.07 kg)  SpO2: 96%    GEN:  Normal appears male in no acute distress.  Appears stated age. HEENT:  Normocephalic, atraumatic. The mucous membranes are moist. The superficial temporal arteries are without ropiness or tenderness. Cardiovascular: Regular rate and rhythm. Lungs: Clear to auscultation bilaterally. Neck/Heme: There are no carotid bruits noted bilaterally.  NEUROLOGICAL: Orientation:  The patient is alert and oriented x 3.  Fund of knowledge is appropriate.  Recent and remote memory intact.  Attention span and concentration normal.  Repeats and names without difficulty. Cranial nerves: There is good facial symmetry. The pupils are equal round and reactive to light bilaterally. Fundoscopic exam reveals clear disc margins bilaterally. Extraocular muscles are intact.  Even with eyes tested separately, I could not elicit any changes or the diplopia.  I had the patient tilt the head to the R and diplopia wasn't elicited but pt kept squeezing eyes shut to refocus but insisted there was no diplopia elicited.  The visual fields are full to confrontational testing. Speech is fluent and clear. Soft palate rises symmetrically and there is no tongue  deviation. Hearing is intact to conversational tone. Tone: Tone is good throughout. Sensation: Sensation is intact to light touch and pinprick throughout (facial, trunk, extremities). Vibration is decreased at the bilateral big toe. There is no extinction with double simultaneous stimulation. There is no sensory dermatomal level identified. Coordination:  The patient has no difficulty with RAM's or  FNF bilaterally. Motor: Strength is 5/5 in the bilateral upper and lower extremities.  Shoulder shrug is equal and symmetric. There is no pronator drift.  There are no fasciculations noted. DTR's: Deep tendon reflexes are 2/4 at the bilateral biceps, triceps, brachioradialis, patella and 1/4 at the bilateral achilles.  Plantar responses are downgoing bilaterally. Gait and Station: The patient is somewhat ataxic.  He got out of the chair and went to walk into our hallway and ran into the door.  He walked in the hallway well.  He stood in the romberg position with eyes open well but could not stand in the romberg position with eyes closed at all.  LABS  Lab Results  Component Value Date   WBC 7.3 02/27/2014   HGB 15.0 02/27/2014   HCT 43.8 02/27/2014   MCV 86.1 02/27/2014   PLT 247 02/27/2014     Chemistry      Component Value Date/Time   NA 140 02/27/2014 2142   K 3.2* 02/27/2014 2142   CL 98 02/27/2014 2142   CO2 28 02/27/2014 2142   BUN 11 02/27/2014 2142   CREATININE 1.16 02/27/2014 2142      Component Value Date/Time   CALCIUM 9.8 02/27/2014 2142   ALKPHOS 72 02/27/2014 2142   AST 27 02/27/2014 2142   ALT 33 02/27/2014 2142   BILITOT 0.4 02/27/2014 2142     No results found for this basename: HGBA1C   Lab Results  Component Value Date   TSH 0.98 04/05/2013   No results found for this basename: VITAMINB12     IMPRESSION/PLAN  1. Diplopia.  -Although I really could not elicit it on examination, hx sounds most consistent with an ischemic event, perhaps affecting the LR.  Pupils were normal  and unaffected.  Hx doesn't sound c/w MG.    -I would like to get an MRI brain today (may or may not show a small event, especially 8 days out) but think that this is important as he was somewhat ataxic on examination  -d/c ASA  -Add plavix.  Risks, benefits, side effects and alternative therapies were discussed.  The opportunity to ask questions was given and they were answered to the best of my ability.  The patient expressed understanding and willingness to follow the outlined treatment protocols.  -Talked about diet, exercise, weight control.  -carotid u/s  -referral to [facility] (will need to come from PCP given humana gold)  -To have fasting chol on tues and tsh on tues (pt not fasting today)  -K (recent hypokalemia), HgBA1C (recent hyperglycemia on labs), B12 (fatigue, evidence of PN on exam) to be done today  -greater than 50% of 60 min visit in counseling and extensive coordination of care

## 2014-03-08 LAB — VITAMIN B12: VITAMIN B 12: 435 pg/mL (ref 211–911)

## 2014-03-11 ENCOUNTER — Other Ambulatory Visit: Payer: Medicare Other

## 2014-03-11 ENCOUNTER — Other Ambulatory Visit (INDEPENDENT_AMBULATORY_CARE_PROVIDER_SITE_OTHER): Payer: Medicare HMO

## 2014-03-11 DIAGNOSIS — Z0000 Encounter for general adult medical examination without abnormal findings: Secondary | ICD-10-CM

## 2014-03-11 LAB — CBC WITH DIFFERENTIAL/PLATELET
Basophils Absolute: 0 10*3/uL (ref 0.0–0.1)
Basophils Relative: 0.6 % (ref 0.0–3.0)
EOS PCT: 0 % (ref 0.0–5.0)
Eosinophils Absolute: 0 10*3/uL (ref 0.0–0.7)
HCT: 44.1 % (ref 39.0–52.0)
HEMOGLOBIN: 14.8 g/dL (ref 13.0–17.0)
Lymphocytes Relative: 29.4 % (ref 12.0–46.0)
Lymphs Abs: 2.2 10*3/uL (ref 0.7–4.0)
MCHC: 33.5 g/dL (ref 30.0–36.0)
MCV: 88.9 fl (ref 78.0–100.0)
MONOS PCT: 11 % (ref 3.0–12.0)
Monocytes Absolute: 0.8 10*3/uL (ref 0.1–1.0)
NEUTROS ABS: 4.5 10*3/uL (ref 1.4–7.7)
Neutrophils Relative %: 59 % (ref 43.0–77.0)
Platelets: 222 10*3/uL (ref 150.0–400.0)
RBC: 4.96 Mil/uL (ref 4.22–5.81)
RDW: 15.8 % — ABNORMAL HIGH (ref 11.5–14.6)
WBC: 7.6 10*3/uL (ref 4.5–10.5)

## 2014-03-11 LAB — BASIC METABOLIC PANEL
BUN: 15 mg/dL (ref 6–23)
CALCIUM: 9.7 mg/dL (ref 8.4–10.5)
CO2: 30 meq/L (ref 19–32)
Chloride: 100 mEq/L (ref 96–112)
Creatinine, Ser: 1.2 mg/dL (ref 0.4–1.5)
GFR: 64.84 mL/min (ref >60.00)
Glucose, Bld: 94 mg/dL (ref 70–99)
Potassium: 3.9 mEq/L (ref 3.5–5.1)
Sodium: 138 mEq/L (ref 135–145)

## 2014-03-11 LAB — HEPATIC FUNCTION PANEL
ALBUMIN: 3.6 g/dL (ref 3.5–5.2)
ALK PHOS: 56 U/L (ref 39–117)
ALT: 29 U/L (ref 0–53)
AST: 22 U/L (ref 0–37)
BILIRUBIN DIRECT: 0.1 mg/dL (ref 0.0–0.3)
Total Bilirubin: 0.5 mg/dL (ref 0.3–1.2)
Total Protein: 6.9 g/dL (ref 6.0–8.3)

## 2014-03-11 LAB — POCT URINALYSIS DIPSTICK
BILIRUBIN UA: NEGATIVE
Glucose, UA: NEGATIVE
Ketones, UA: NEGATIVE
Leukocytes, UA: NEGATIVE
Nitrite, UA: NEGATIVE
PH UA: 6
SPEC GRAV UA: 1.025
Urobilinogen, UA: 0.2

## 2014-03-11 LAB — PSA: PSA: 1.17 ng/mL (ref 0.10–4.00)

## 2014-03-11 LAB — LIPID PANEL
CHOLESTEROL: 97 mg/dL (ref 0–200)
HDL: 26.1 mg/dL — ABNORMAL LOW (ref >39.00)
LDL CALC: 52 mg/dL (ref 0–99)
Total CHOL/HDL Ratio: 4
Triglycerides: 94 mg/dL (ref 0.0–149.0)
VLDL: 18.8 mg/dL (ref 0.0–40.0)

## 2014-03-11 LAB — TSH: TSH: 1.24 u[IU]/mL (ref 0.35–5.50)

## 2014-03-12 ENCOUNTER — Ambulatory Visit (HOSPITAL_COMMUNITY)
Admission: RE | Admit: 2014-03-12 | Discharge: 2014-03-12 | Disposition: A | Discharge status: Home / Self Care | Payer: Medicare HMO | Source: Ambulatory Visit | Attending: Neurology | Admitting: Neurology

## 2014-03-12 DIAGNOSIS — I6782 Cerebral ischemia: Secondary | ICD-10-CM

## 2014-03-12 DIAGNOSIS — H5310 Unspecified subjective visual disturbances: Secondary | ICD-10-CM

## 2014-03-12 DIAGNOSIS — G458 Other transient cerebral ischemic attacks and related syndromes: Secondary | ICD-10-CM | POA: Insufficient documentation

## 2014-03-12 DIAGNOSIS — M722 Plantar fascial fibromatosis: Secondary | ICD-10-CM

## 2014-03-12 DIAGNOSIS — I10 Essential (primary) hypertension: Secondary | ICD-10-CM | POA: Insufficient documentation

## 2014-03-12 DIAGNOSIS — E785 Hyperlipidemia, unspecified: Secondary | ICD-10-CM | POA: Insufficient documentation

## 2014-03-12 NOTE — Progress Notes (Signed)
Bilateral carotid artery duplex:  1-39% ICA stenosis.  Vertebral artery flow is antegrade.

## 2014-03-19 ENCOUNTER — Encounter: Payer: Medicare Other | Admitting: Internal Medicine

## 2014-03-31 ENCOUNTER — Telehealth: Payer: Self-pay

## 2014-03-31 NOTE — Telephone Encounter (Signed)
Patient notified to come for repeat lab work.  He states he will come one day this week

## 2014-03-31 NOTE — Telephone Encounter (Signed)
Message copied by Marlon Pel on Mon Mar 31, 2014 10:52 AM ------      Message from: Marlon Pel      Created: Mon Feb 17, 2014  3:52 PM       Needs lipase - orders in call patient ------

## 2014-04-08 ENCOUNTER — Other Ambulatory Visit (INDEPENDENT_AMBULATORY_CARE_PROVIDER_SITE_OTHER): Payer: Medicare HMO

## 2014-04-08 DIAGNOSIS — R748 Abnormal levels of other serum enzymes: Secondary | ICD-10-CM

## 2014-04-08 LAB — LIPASE: LIPASE: 17 U/L (ref 11.0–59.0)

## 2014-04-25 ENCOUNTER — Ambulatory Visit (INDEPENDENT_AMBULATORY_CARE_PROVIDER_SITE_OTHER): Payer: Medicare HMO | Admitting: Internal Medicine

## 2014-04-25 ENCOUNTER — Encounter: Payer: Self-pay | Admitting: Internal Medicine

## 2014-04-25 VITALS — BP 110/70 | HR 59 | Temp 98.1°F | Resp 20 | Ht 68.0 in | Wt 216.0 lb

## 2014-04-25 DIAGNOSIS — Z23 Encounter for immunization: Secondary | ICD-10-CM

## 2014-04-25 DIAGNOSIS — Z0000 Encounter for general adult medical examination without abnormal findings: Secondary | ICD-10-CM

## 2014-04-25 DIAGNOSIS — E669 Obesity, unspecified: Secondary | ICD-10-CM | POA: Insufficient documentation

## 2014-04-25 DIAGNOSIS — J329 Chronic sinusitis, unspecified: Secondary | ICD-10-CM

## 2014-04-25 MED ORDER — DOXYCYCLINE HYCLATE 100 MG PO TABS: 100.0000 mg | ORAL_TABLET | Freq: Two times a day (BID) | ORAL | Status: DC

## 2014-04-25 MED ORDER — CLOPIDOGREL BISULFATE 75 MG PO TABS: 75.0000 mg | ORAL_TABLET | Freq: Every day | ORAL | Status: DC

## 2014-04-25 NOTE — Patient Instructions (Signed)
The patient is instructed to continue all medications as prescribed. Schedule followup with check out clerk upon leaving the clinic  

## 2014-04-25 NOTE — Progress Notes (Signed)
Pre-visit discussion using our clinic review tool. No additional management support is needed unless otherwise documented below in the visit note.  

## 2014-04-25 NOTE — Progress Notes (Signed)
Subjective:    Patient ID: [name], male    DOB: Oct 22, 1946, 68 y.o.   MRN: [id number]  HPI [place] HTN UC followed by GI [facility]IA with normal MRI  MRI showed sinus swelling   Review of Systems  Constitutional: Negative for fever and fatigue.  HENT: Negative for congestion, hearing loss and postnasal drip.   Eyes: Negative for discharge, redness and visual disturbance.  Respiratory: Negative for cough, shortness of breath and wheezing.   Cardiovascular: Negative for leg swelling.  Gastrointestinal: Negative for abdominal pain, constipation and abdominal distention.  Genitourinary: Negative for urgency and frequency.  Musculoskeletal: Negative for arthralgias, joint swelling and neck pain.  Skin: Negative for color change and rash.  Neurological: Positive for weakness. Negative for seizures, speech difficulty, light-headedness and numbness.  Hematological: Negative for adenopathy.  Psychiatric/Behavioral: Negative for behavioral problems.       Past Medical History  Diagnosis Date  . GERD (gastroesophageal reflux disease)   . Adenomatous polyps 03/2004  . Trigger finger (acquired)   . Coronary atherosclerosis of unspecified type of vessel, native or graft   . Hypertension   . Hyperlipidemia   . 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 Esophagitis 1993  . Sleep apnea   . Arthritis   . Myocardial infarction 1993    History   Social History  . Marital Status: Married    Spouse Name: N/A    Number of Children: 3  . Years of Education: N/A   Occupational History  . apartment maintenance     retired   Social History Main Topics  . Smoking status: Former Research scientist (life sciences)  . Smokeless tobacco: Former Systems developer    Types: Chew     Comment: quit 50 years ago  . Alcohol Use: No  . Drug Use: No  . Sexual Activity: Not on file   Other Topics Concern  . Not on file   Social History Narrative   Pt does not get regular exercise--daily caffeine use-5 cups daily     Past Surgical History  Procedure Laterality Date  . Cholecystectomy  2002  . Rotator cuff repair  2009    right  . Lumbar fusion  1983  . Angioplasty  1993    Family History  Problem Relation Age of Onset  . Stroke Mother   . COPD Father   . Heart disease Sister   . Colon cancer Neg Hx     Allergies  Allergen Reactions  . Penicillins     REACTION: rash    Current Outpatient Prescriptions on File Prior to Visit  Medication Sig Dispense Refill  . atenolol (TENORMIN) 25 MG tablet Take 25 mg by mouth daily.      Marland Kitchen atorvastatin (LIPITOR) 10 MG tablet Take 10 mg by mouth at bedtime.      . clopidogrel (PLAVIX) 75 MG tablet Take 1 tablet (75 mg total) by mouth daily.  30 tablet  1  . clotrimazole-betamethasone (LOTRISONE) cream Apply topically 2 (two) times daily.  30 g  0  . diphenhydramine-acetaminophen (TYLENOL PM) 25-500 MG TABS Take 1 tablet by mouth at bedtime as needed. sleep      . fluticasone (FLONASE) 50 MCG/ACT nasal spray Place 1 spray into both nostrils daily.      Marland Kitchen loratadine (CLARITIN) 10 MG tablet Take 10 mg by mouth daily.      . mercaptopurine (PURINETHOL) 50 MG tablet Take 75 mg by mouth daily. Give on an empty stomach 1 hour before or 2  hours after meals. Caution: Chemotherapy.      . nitroGLYCERIN (NITROSTAT) 0.4 MG SL tablet Place 1 tablet (0.4 mg total) under the tongue every 5 (five) minutes as needed.  30 tablet  3  . pantoprazole (PROTONIX) 40 MG tablet Take 1 tablet (40 mg total) by mouth daily.  30 tablet  5  . valsartan-hydrochlorothiazide (DIOVAN-HCT) 160-25 MG per tablet Take 1 tablet by mouth daily.       No current facility-administered medications on file prior to visit.    BP 110/70  Pulse 59  Temp(Src) 98.1 F (36.7 C) (Oral)  Resp 20  Ht 5' 8"  (1.727 m)  Wt 216 lb (97.977 kg)  BMI 32.85 kg/m2  SpO2 98%    Objective:   Physical Exam  Constitutional: He appears well-developed and well-nourished.  HENT:  Head: Normocephalic and  atraumatic.  Eyes: Conjunctivae are normal. Pupils are equal, round, and reactive to light.  Neck: Normal range of motion. Neck supple.  Cardiovascular: Normal rate and regular rhythm.   Murmur heard. Pulmonary/Chest: Effort normal and breath sounds normal.  Abdominal: Soft. Bowel sounds are normal.          Assessment & Plan:  Patient presents for yearly preventative medicine examination. Medicare questionnaire was completed  All immunizations and health maintenance protocols were reviewed with the patient and needed orders were placed.  Appropriate screening laboratory values were ordered for the patient including screening of hyperlipidemia, renal function and hepatic function. If indicated by BPH, a PSA was ordered.  Medication reconciliation,  past medical history, social history, problem list and allergies were reviewed in detail with the patient  Goals were established with regard to weight loss, exercise, and  diet in compliance with medications  End of life planning was discussed.

## 2014-04-28 ENCOUNTER — Other Ambulatory Visit: Payer: Self-pay | Admitting: Internal Medicine

## 2014-05-05 ENCOUNTER — Encounter: Payer: Self-pay | Admitting: *Deleted

## 2014-07-04 ENCOUNTER — Other Ambulatory Visit: Payer: Self-pay | Admitting: Internal Medicine

## 2014-08-04 ENCOUNTER — Other Ambulatory Visit: Payer: Self-pay | Admitting: Internal Medicine

## 2014-09-04 ENCOUNTER — Other Ambulatory Visit: Payer: Self-pay | Admitting: Gastroenterology

## 2014-09-18 ENCOUNTER — Telehealth: Payer: Self-pay | Admitting: Gastroenterology

## 2014-09-18 NOTE — Telephone Encounter (Signed)
Patient is due for office visit for 6 month follow up . I have scheduled with the patient for 10/02/14 11:15

## 2014-09-29 ENCOUNTER — Telehealth: Payer: Self-pay | Admitting: Internal Medicine

## 2014-09-29 NOTE — Telephone Encounter (Signed)
Pt has appt thurs 11/19 w/ dr stark and needs referral [facility]

## 2014-09-30 NOTE — Telephone Encounter (Signed)
Authorization #[id number]   done for pt to see dr stark on 10/02/2014 dd

## 2014-10-02 ENCOUNTER — Encounter: Payer: Self-pay | Admitting: Gastroenterology

## 2014-10-02 ENCOUNTER — Ambulatory Visit (INDEPENDENT_AMBULATORY_CARE_PROVIDER_SITE_OTHER): Payer: Commercial Managed Care - HMO | Admitting: Gastroenterology

## 2014-10-02 ENCOUNTER — Other Ambulatory Visit (INDEPENDENT_AMBULATORY_CARE_PROVIDER_SITE_OTHER): Payer: Commercial Managed Care - HMO

## 2014-10-02 VITALS — BP 122/68 | HR 60 | Ht 67.0 in | Wt 224.1 lb

## 2014-10-02 DIAGNOSIS — K5130 Ulcerative (chronic) rectosigmoiditis without complications: Secondary | ICD-10-CM

## 2014-10-02 DIAGNOSIS — K219 Gastro-esophageal reflux disease without esophagitis: Secondary | ICD-10-CM

## 2014-10-02 LAB — BASIC METABOLIC PANEL
BUN: 15 mg/dL (ref 6–23)
CALCIUM: 9.2 mg/dL (ref 8.4–10.5)
CO2: 28 meq/L (ref 19–32)
CREATININE: 1.4 mg/dL (ref 0.4–1.5)
Chloride: 100 mEq/L (ref 96–112)
GFR: 55.49 mL/min — AB (ref >60.00)
GLUCOSE: 89 mg/dL (ref 70–99)
Potassium: 4.1 mEq/L (ref 3.5–5.1)
Sodium: 136 mEq/L (ref 135–145)

## 2014-10-02 LAB — CBC WITH DIFFERENTIAL/PLATELET
BASOS ABS: 0.1 10*3/uL (ref 0.0–0.1)
Basophils Relative: 0.8 % (ref 0.0–3.0)
EOS PCT: 0 % (ref 0.0–5.0)
Eosinophils Absolute: 0 10*3/uL (ref 0.0–0.7)
HEMATOCRIT: 41.8 % (ref 39.0–52.0)
HEMOGLOBIN: 14 g/dL (ref 13.0–17.0)
LYMPHS ABS: 1.9 10*3/uL (ref 0.7–4.0)
Lymphocytes Relative: 27.7 % (ref 12.0–46.0)
MCHC: 33.5 g/dL (ref 30.0–36.0)
MCV: 86.5 fl (ref 78.0–100.0)
Monocytes Absolute: 0.8 10*3/uL (ref 0.1–1.0)
Monocytes Relative: 11.8 % (ref 3.0–12.0)
NEUTROS ABS: 4.2 10*3/uL (ref 1.4–7.7)
Neutrophils Relative %: 59.7 % (ref 43.0–77.0)
Platelets: 235 10*3/uL (ref 150.0–400.0)
RBC: 4.83 Mil/uL (ref 4.22–5.81)
RDW: 15.8 % — ABNORMAL HIGH (ref 11.5–15.5)
WBC: 7 10*3/uL (ref 4.0–10.5)

## 2014-10-02 LAB — HEPATIC FUNCTION PANEL
ALBUMIN: 3.6 g/dL (ref 3.5–5.2)
ALT: 29 U/L (ref 0–53)
AST: 24 U/L (ref 0–37)
Alkaline Phosphatase: 64 U/L (ref 39–117)
Bilirubin, Direct: 0.1 mg/dL (ref 0.0–0.3)
TOTAL PROTEIN: 7 g/dL (ref 6.0–8.3)
Total Bilirubin: 0.7 mg/dL (ref 0.2–1.2)

## 2014-10-02 LAB — LIPASE: Lipase: 29 U/L (ref 11.0–59.0)

## 2014-10-02 LAB — TSH: TSH: 1.11 u[IU]/mL (ref 0.35–4.50)

## 2014-10-02 MED ORDER — MERCAPTOPURINE 50 MG PO TABS: ORAL_TABLET | ORAL | Status: DC

## 2014-10-02 NOTE — Progress Notes (Signed)
    History of Present Illness: This is a 68 year old male with ulcerative proctosigmoiditis and GERD. He relates occasional breakthrough reflux symptoms. He has had no activity of his colitis.  Current Medications, Allergies, Past Medical History, Past Surgical History, Family History and Social History were reviewed in [employer] record.  Physical Exam: General: Well developed , well nourished, no acute distress Head: Normocephalic and atraumatic Eyes:  sclerae anicteric, EOMI Ears: Normal auditory acuity Mouth: No deformity or lesions Lungs: Clear throughout to auscultation Heart: Regular rate and rhythm; no murmurs, rubs or bruits Abdomen: Soft, non tender and non distended. No masses, hepatosplenomegaly or hernias noted. Normal Bowel sounds Musculoskeletal: Symmetrical with no gross deformities  Pulses:  Normal pulses noted Extremities: No clubbing, cyanosis, edema or deformities noted Neurological: Alert oriented x 4, grossly nonfocal Psychological:  Alert and cooperative. Normal mood and affect  Assessment and Recommendations:  1. Ulcerative proctosigmoiditis under excellent control on current therapy. Standard blood work today. Continue 6-MP 75 mg daily. Followup visit in 6 months.   2. GERD. Continue pantoprazole 40 mg daily. Intensify antireflux measures. Use TUMS as needed.

## 2014-10-02 NOTE — Patient Instructions (Signed)
We have sent medications to your pharmacy for you to pick up at your convenience. Your physician has requested that you go to the basement for  lab work before leaving today. Follow up in 6 months with Dr Fuller Plan. CC:  Garret Reddish MD

## 2014-10-24 ENCOUNTER — Ambulatory Visit: Payer: Medicare HMO | Admitting: Physician Assistant

## 2014-10-24 ENCOUNTER — Ambulatory Visit (INDEPENDENT_AMBULATORY_CARE_PROVIDER_SITE_OTHER): Payer: Commercial Managed Care - HMO | Admitting: Family Medicine

## 2014-10-24 VITALS — BP 110/66 | HR 55 | Temp 97.6°F | Wt 223.0 lb

## 2014-10-24 DIAGNOSIS — N183 Chronic kidney disease, stage 3 unspecified: Secondary | ICD-10-CM

## 2014-10-24 DIAGNOSIS — I10 Essential (primary) hypertension: Secondary | ICD-10-CM

## 2014-10-24 DIAGNOSIS — I2510 Atherosclerotic heart disease of native coronary artery without angina pectoris: Secondary | ICD-10-CM

## 2014-10-24 DIAGNOSIS — J309 Allergic rhinitis, unspecified: Secondary | ICD-10-CM | POA: Insufficient documentation

## 2014-10-24 DIAGNOSIS — E785 Hyperlipidemia, unspecified: Secondary | ICD-10-CM

## 2014-10-24 LAB — BASIC METABOLIC PANEL
BUN: 15 mg/dL (ref 6–23)
CO2: 28 meq/L (ref 19–32)
Calcium: 9.4 mg/dL (ref 8.4–10.5)
Chloride: 101 mEq/L (ref 96–112)
Creatinine, Ser: 1.3 mg/dL (ref 0.4–1.5)
GFR: 57.42 mL/min — ABNORMAL LOW (ref >60.00)
Glucose, Bld: 95 mg/dL (ref 70–99)
POTASSIUM: 3.7 meq/L (ref 3.5–5.1)
SODIUM: 134 meq/L — AB (ref 135–145)

## 2014-10-24 MED ORDER — NITROGLYCERIN 0.4 MG SL SUBL: 0.4000 mg | SUBLINGUAL_TABLET | SUBLINGUAL | Status: DC | PRN

## 2014-10-24 NOTE — Progress Notes (Signed)
[name] Reddish, MD Phone: [phone]  Subjective:   [name] is a 68 y.o. year old very pleasant male patient who presents with the following:  Hypertension-good control CKD Stage III- mild worsening from stage II on last check BP Readings from Last 3 Encounters:  10/24/14 110/66  10/02/14 122/68  04/25/14 110/70  Home BP monitoring-no Compliant with medications-yes without side effects ROS-Denies any CP, HA, SOB, blurry vision.   Hyperlipidemia-good control CAD- stable asymptomatic Lab Results  Component Value Date   LDLCALC 52 03/11/2014  On statin: atorvastatin 8m   Regular exercise: advised Diet: some poor choices ROS- no chest pain or shortness of breath. No myalgias  no lightheadedness/dizziness  Past Medical History- Patient Active Problem List   Diagnosis Date Noted  . ULCERATIVE COLITIS-LEFT SIDE 07/03/2008    Priority: High  . CAD (coronary artery disease) 06/15/2007    Priority: High  . CKD (chronic kidney disease), stage III 10/24/2014    Priority: Medium  . Sleep apnea 07/02/2008    Priority: Medium  . Hyperlipidemia 06/14/2007    Priority: Medium  . Essential hypertension 06/14/2007    Priority: Medium  . Allergic rhinitis 10/24/2014    Priority: Low  . Obesity (BMI 30-39.9) 04/25/2014    Priority: Low  . Right cervical radiculopathy 04/12/2013    Priority: Low  . Low back pain 01/11/2008    Priority: Low  . GERD 11/07/2007    Priority: Low  . Trigger finger, acquired 10/23/2007    Priority: Low  . Personal History of Colonic Polyps 06/14/2007   Medications- reviewed and updated Current Outpatient Prescriptions  Medication Sig Dispense Refill  . atenolol (TENORMIN) 25 MG tablet TAKE ONE TABLET BY MOUTH ONCE DAILY 90 tablet 1  . atorvastatin (LIPITOR) 10 MG tablet TAKE ONE TABLET BY MOUTH ONCE DAILY. 30 tablet 6  . clopidogrel (PLAVIX) 75 MG tablet Take 1 tablet (75 mg total) by mouth daily. 30 tablet 6  . loratadine  (CLARITIN) 10 MG tablet Take 10 mg by mouth daily.    . mercaptopurine (PURINETHOL) 50 MG tablet TAKE 1 AND 1/2 TABLETS DAILY.GIVE ON AN EMPTY STOMACH 1 HOUR BEFORE OR 2 HOURS AFTER MEAL. 45 tablet 6  . pantoprazole (PROTONIX) 40 MG tablet Take 1 tablet (40 mg total) by mouth daily. 30 tablet 5  . valsartan-hydrochlorothiazide (DIOVAN-HCT) 160-25 MG per tablet TAKE ONE TABLET DAILY. 30 tablet 11  . diphenhydramine-acetaminophen (TYLENOL PM) 25-500 MG TABS Take 1 tablet by mouth at bedtime as needed. sleep    . fluticasone (FLONASE) 50 MCG/ACT nasal spray Place 1 spray into both nostrils daily.    . nitroGLYCERIN (NITROSTAT) 0.4 MG SL tablet Place 1 tablet (0.4 mg total) under the tongue every 5 (five) minutes as needed. 30 tablet 3   No current facility-administered medications for this visit.    Objective: BP 110/66 mmHg  Pulse 55  Temp(Src) 97.6 F (36.4 C)  Wt 223 lb (101.152 kg) Gen: NAD, resting comfortably in chair CV: RRR no murmurs rubs or gallops Lungs: CTAB no crackles, wheeze, rhonchi Abdomen: soft/nontender/nondistended/normal bowel sounds.  Ext: trace (perhaps 1+) edema Skin: warm, dry, mild redness across face with some red macules Neuro: grossly normal, moves all extremities, PERRLA, perhaps left face mild droop compared to right but minor if present   Results for orders placed or performed in visit on 10/24/14 (from the past 72 hour(s))  Basic metabolic panel     Status: Abnormal   Collection Time: 10/24/14  2:05  PM  Result Value Ref Range   Sodium 134 (L) 135 - 145 mEq/L   Potassium 3.7 3.5 - 5.1 mEq/L   Chloride 101 96 - 112 mEq/L   CO2 28 19 - 32 mEq/L   Glucose, Bld 95 70 - 99 mg/dL   BUN 15 6 - 23 mg/dL   Creatinine, Ser 1.3 0.4 - 1.5 mg/dL   Calcium 9.4 8.4 - 10.5 mg/dL   GFR 57.42 (L) >60.00 mL/min   Assessment/Plan:  CAD (coronary artery disease) Well controlled asymptomatic. Continue plavix (hx CVA) and atorvastatin. Could consider asa and plavix  together.   CKD (chronic kidney disease), stage III GFR stable in 55-60 range on repeat. Avoid nsaids. Continue valsartan.   Essential hypertension Well controlled on Valsartan hctz 160-25 mg daily- continue.   Hyperlipidemia Well controlled on atorvastatin 63m with last LDL 52. Repeat 6 months.   Return precautions advised. 6 month follow up with fasting labs planned at that time.  Mild hyponatremia noted-repeat at next visit.   Refilled though not used.  Meds ordered this encounter  Medications  . nitroGLYCERIN (NITROSTAT) 0.4 MG SL tablet    Sig: Place 1 tablet (0.4 mg total) under the tongue every 5 (five) minutes as needed.    Dispense:  30 tablet    Refill:  3

## 2014-10-24 NOTE — Patient Instructions (Addendum)
Blood pressure looks great.   Cholesterol looks great.   Recheck kidney function. Avoid aleve. Only tylenol.   Refilled nitroglycerin-glad you don't have to use it.   See me in 6 months for an establish visit (30 minutes)and we can update your labs if you are fasting.

## 2014-10-26 ENCOUNTER — Encounter: Payer: Self-pay | Admitting: Family Medicine

## 2014-10-26 NOTE — Assessment & Plan Note (Signed)
Well controlled on Valsartan hctz 160-25 mg daily- continue.

## 2014-10-26 NOTE — Assessment & Plan Note (Signed)
GFR stable in 55-60 range on repeat. Avoid nsaids. Continue valsartan.

## 2014-10-26 NOTE — Assessment & Plan Note (Signed)
Well controlled asymptomatic. Continue plavix (hx CVA) and atorvastatin. Could consider asa and plavix together.

## 2014-10-26 NOTE — Assessment & Plan Note (Signed)
Well controlled on atorvastatin 68m with last LDL 52. Repeat 6 months.

## 2014-12-09 ENCOUNTER — Other Ambulatory Visit: Payer: Self-pay | Admitting: *Deleted

## 2014-12-09 MED ORDER — CLOPIDOGREL BISULFATE 75 MG PO TABS: 75.0000 mg | ORAL_TABLET | Freq: Every day | ORAL | Status: DC

## 2014-12-29 DIAGNOSIS — G4733 Obstructive sleep apnea (adult) (pediatric): Secondary | ICD-10-CM | POA: Diagnosis not present

## 2015-01-08 ENCOUNTER — Telehealth: Payer: Self-pay | Admitting: *Deleted

## 2015-01-08 MED ORDER — ATENOLOL 25 MG PO TABS: 25.0000 mg | ORAL_TABLET | Freq: Every day | ORAL | Status: DC

## 2015-01-08 NOTE — Telephone Encounter (Signed)
atenolol (TENORMIN) 25 MG tablet  Medication   Date: 07/07/2014  Department: [facility]: Ricard Dillon, MD      Order Providers    Prescribing Provider Encounter Provider   Ricard Dillon, MD Ricard Dillon, MD    Medication Detail      Disp Refills Start End     atenolol (TENORMIN) 25 MG tablet 90 tablet 1 07/07/2014     Sig: TAKE ONE TABLET BY MOUTH ONCE DAILY    E-Prescribing Status: Receipt confirmed by pharmacy (07/07/2014 9:22 AM EDT)     Pharmacy    [facility] [place], [place] - [address]        Additional Information    Associated Reports   View Encounter   Priority and Order Details

## 2015-01-08 NOTE — Telephone Encounter (Signed)
atenolol (TENORMIN) 25 MG tablet  Medication   Date: 07/07/2014  Department: [facility]: Ricard Dillon, MD      Order Providers    Prescribing Provider Encounter Provider   Ricard Dillon, MD Ricard Dillon, MD    Medication Detail      Disp Refills Start End     atenolol (TENORMIN) 25 MG tablet 90 tablet 1 07/07/2014     Sig: TAKE ONE TABLET BY MOUTH ONCE DAILY    E-Prescribing Status: Receipt confirmed by pharmacy (07/07/2014 9:22 AM EDT)     Pharmacy    [facility] [place], [place] - [address]        Additional Information    Associated Reports   View Encounter   Priority and Order Details

## 2015-01-08 NOTE — Telephone Encounter (Signed)
Medication refilled

## 2015-01-15 ENCOUNTER — Encounter (HOSPITAL_COMMUNITY): Payer: Self-pay | Admitting: Emergency Medicine

## 2015-01-15 ENCOUNTER — Emergency Department (HOSPITAL_COMMUNITY): Payer: Commercial Managed Care - HMO

## 2015-01-15 ENCOUNTER — Emergency Department (HOSPITAL_COMMUNITY)
Admission: EM | Admit: 2015-01-15 | Discharge: 2015-01-15 | Disposition: A | Discharge status: Home / Self Care | Payer: Commercial Managed Care - HMO | Attending: Emergency Medicine | Admitting: Emergency Medicine

## 2015-01-15 ENCOUNTER — Telehealth: Payer: Self-pay | Admitting: Family Medicine

## 2015-01-15 DIAGNOSIS — I2510 Atherosclerotic heart disease of native coronary artery without angina pectoris: Secondary | ICD-10-CM | POA: Insufficient documentation

## 2015-01-15 DIAGNOSIS — Z79899 Other long term (current) drug therapy: Secondary | ICD-10-CM | POA: Diagnosis not present

## 2015-01-15 DIAGNOSIS — Z8673 Personal history of transient ischemic attack (TIA), and cerebral infarction without residual deficits: Secondary | ICD-10-CM | POA: Insufficient documentation

## 2015-01-15 DIAGNOSIS — Z86018 Personal history of other benign neoplasm: Secondary | ICD-10-CM | POA: Insufficient documentation

## 2015-01-15 DIAGNOSIS — Z792 Long term (current) use of antibiotics: Secondary | ICD-10-CM | POA: Insufficient documentation

## 2015-01-15 DIAGNOSIS — Z7951 Long term (current) use of inhaled steroids: Secondary | ICD-10-CM | POA: Insufficient documentation

## 2015-01-15 DIAGNOSIS — I10 Essential (primary) hypertension: Secondary | ICD-10-CM | POA: Diagnosis not present

## 2015-01-15 DIAGNOSIS — K219 Gastro-esophageal reflux disease without esophagitis: Secondary | ICD-10-CM | POA: Diagnosis not present

## 2015-01-15 DIAGNOSIS — Z8739 Personal history of other diseases of the musculoskeletal system and connective tissue: Secondary | ICD-10-CM | POA: Diagnosis not present

## 2015-01-15 DIAGNOSIS — Z880 Allergy status to penicillin: Secondary | ICD-10-CM | POA: Insufficient documentation

## 2015-01-15 DIAGNOSIS — Z87891 Personal history of nicotine dependence: Secondary | ICD-10-CM | POA: Diagnosis not present

## 2015-01-15 DIAGNOSIS — H532 Diplopia: Secondary | ICD-10-CM | POA: Diagnosis not present

## 2015-01-15 DIAGNOSIS — I252 Old myocardial infarction: Secondary | ICD-10-CM | POA: Diagnosis not present

## 2015-01-15 DIAGNOSIS — E785 Hyperlipidemia, unspecified: Secondary | ICD-10-CM | POA: Diagnosis not present

## 2015-01-15 LAB — URINALYSIS, ROUTINE W REFLEX MICROSCOPIC
BILIRUBIN URINE: NEGATIVE
Glucose, UA: NEGATIVE mg/dL
Ketones, ur: NEGATIVE mg/dL
Leukocytes, UA: NEGATIVE
Nitrite: NEGATIVE
PROTEIN: NEGATIVE mg/dL
Specific Gravity, Urine: 1.007 (ref 1.005–1.030)
UROBILINOGEN UA: 0.2 mg/dL (ref 0.0–1.0)
pH: 6.5 (ref 5.0–8.0)

## 2015-01-15 LAB — CBG MONITORING, ED: Glucose-Capillary: 120 mg/dL — ABNORMAL HIGH (ref 70–99)

## 2015-01-15 LAB — RAPID URINE DRUG SCREEN, HOSP PERFORMED
Amphetamines: NOT DETECTED
Barbiturates: NOT DETECTED
Benzodiazepines: NOT DETECTED
Cocaine: NOT DETECTED
OPIATES: NOT DETECTED
TETRAHYDROCANNABINOL: NOT DETECTED

## 2015-01-15 LAB — COMPREHENSIVE METABOLIC PANEL
ALK PHOS: 71 U/L (ref 39–117)
ALT: 31 U/L (ref 0–53)
ANION GAP: 5 (ref 5–15)
AST: 27 U/L (ref 0–37)
Albumin: 3.4 g/dL — ABNORMAL LOW (ref 3.5–5.2)
BILIRUBIN TOTAL: 0.6 mg/dL (ref 0.3–1.2)
BUN: 10 mg/dL (ref 6–23)
CHLORIDE: 99 mmol/L (ref 96–112)
CO2: 33 mmol/L — AB (ref 19–32)
Calcium: 8.9 mg/dL (ref 8.4–10.5)
Creatinine, Ser: 1.33 mg/dL (ref 0.50–1.35)
GFR calc Af Amer: 62 mL/min — ABNORMAL LOW (ref >90)
GFR, EST NON AFRICAN AMERICAN: 54 mL/min — AB (ref >90)
Glucose, Bld: 108 mg/dL — ABNORMAL HIGH (ref 70–99)
Potassium: 3.3 mmol/L — ABNORMAL LOW (ref 3.5–5.1)
Sodium: 137 mmol/L (ref 135–145)
Total Protein: 6.9 g/dL (ref 6.0–8.3)

## 2015-01-15 LAB — CBC
HCT: 43.4 % (ref 39.0–52.0)
Hemoglobin: 14.6 g/dL (ref 13.0–17.0)
MCH: 28.7 pg (ref 26.0–34.0)
MCHC: 33.6 g/dL (ref 30.0–36.0)
MCV: 85.3 fL (ref 78.0–100.0)
PLATELETS: 206 10*3/uL (ref 150–400)
RBC: 5.09 MIL/uL (ref 4.22–5.81)
RDW: 15.1 % (ref 11.5–15.5)
WBC: 6.6 10*3/uL (ref 4.0–10.5)

## 2015-01-15 LAB — I-STAT TROPONIN, ED: Troponin i, poc: 0 ng/mL (ref 0.00–0.08)

## 2015-01-15 LAB — URINE MICROSCOPIC-ADD ON

## 2015-01-15 LAB — DIFFERENTIAL
BASOS PCT: 1 % (ref 0–1)
Basophils Absolute: 0.1 10*3/uL (ref 0.0–0.1)
EOS ABS: 0.3 10*3/uL (ref 0.0–0.7)
Eosinophils Relative: 4 % (ref 0–5)
LYMPHS ABS: 1.8 10*3/uL (ref 0.7–4.0)
Lymphocytes Relative: 27 % (ref 12–46)
MONO ABS: 0.8 10*3/uL (ref 0.1–1.0)
MONOS PCT: 13 % — AB (ref 3–12)
Neutro Abs: 3.7 10*3/uL (ref 1.7–7.7)
Neutrophils Relative %: 55 % (ref 43–77)

## 2015-01-15 LAB — APTT: aPTT: 30 seconds (ref 24–37)

## 2015-01-15 LAB — PROTIME-INR
INR: 1.08 (ref 0.00–1.49)
Prothrombin Time: 14.1 seconds (ref 11.6–15.2)

## 2015-01-15 LAB — ETHANOL

## 2015-01-15 NOTE — ED Notes (Signed)
MRI called. [name] reports MRI may be available around 1545. Possibility MRI may not be available until 1700.

## 2015-01-15 NOTE — ED Notes (Signed)
Pt transported to MRI 

## 2015-01-15 NOTE — ED Notes (Signed)
Pt is stable upon d/c and is escorted from ED via wheelchair. Pt has no questions rt d/c or instructions.

## 2015-01-15 NOTE — Discharge Instructions (Signed)
Diplopia  °Double vision (diplopia) means that you are seeing two of everything. Diplopia usually occurs with both eyes open, and may be worse when looking in one particular direction. If both eyes must be open to see double, this is called binocular diplopia. If double images are seen in just one eye, this is called monocular diplopia.  °CAUSES  °Binocular Diplopia °· Disorder affecting the muscles that move the eyes or the nerves that control those muscles. °· Tumor or other mass pushing on an eye from beside or behind the eye. °· Myasthenia gravis. This is a neuromuscular illness that causes the body's muscles to tire easily. The eye muscles and eyelid muscles become weak. The eyes do not track together well. °· Grave's disease. This is an overactivity of the thyroid gland. This condition causes swelling of tissues around the eyes. This produces a bulging out of the eyeball. °· Blowout fracture of the bone around the eye. The muscles of the eye socket are damaged. This often happens when the eye is hit with force. °· Complications after certain surgeries, such as a lens implant after cataract surgery. °· Fluid-filled mass (abscess) behind or beside the eye, infection, or abnormal connection between arteries and veins. °Sometimes, no cause is found. °Monocular Diplopia °· Problems with corrective lens or contacts. °· Corneal abrasion, infection, severe injury, or bulging and irregularity of the corneal surface (keratoconus). °· Irregularities of the pupil from drugs, severe injury, or other causes. °· Problems involving the lens of the eye, such as opacities or cataracts. °· Complications after certain surgeries, such as a lens implant after cataract surgery. °· Retinal detachment or problems involving the blood vessels of the retina. °Sometimes, no cause is found. °SYMPTOMS  °Binocular Diplopia °· When one eye is closed or covered, the double images disappear. °Monocular Diplopia °· When the unaffected eye is  closed or covered, the double images remain. The double images disappear when the affected eye is closed or covered. °DIAGNOSIS  °A diagnosis is made during an eye exam. °TREATMENT  °Treatment depends on the cause or underlying disease.  °· Relief of double vision symptoms may be achieved by patching one eye or by using special glasses. °· Surgery on the muscles of the eye may be needed. °SEEK IMMEDIATE MEDICAL CARE IF:  °· You see two images of a single object you are looking at, either with both eyes open or with just one eye open. °Document Released: 09/01/2004 Document Revised: 01/23/2012 Document Reviewed: 06/18/2008 °ExitCare® Patient Information ©2015 [employer]. This information is not intended to replace advice given to you by your health care provider. Make sure you discuss any questions you have with your health care provider. ° °

## 2015-01-15 NOTE — ED Notes (Signed)
Patient states had double vision and blurry vision since yesterday morning that has remained constant.   Patient states had same symptoms last April and was told he had TIA.   Patient denies any other symptoms.  Patient has no facial drooping, no weakness, no slurring speech, no tingling or numbers.    Patient A & O x 4.

## 2015-01-15 NOTE — ED Provider Notes (Signed)
CSN: [id number]     Arrival date & time 01/15/15  55 History   First MD Initiated Contact with Patient 01/15/15 1046     CC: Blurred vision  HPI Pt woke up yesterday morning and noticed that his vision was blurred.    He is seeing two of everything.  Patient states his symptoms have persisted and not improved.  He denies similar event last April and was told that he might of had a TIA after seeing both an ophthalmologist and a neurologist. He called his doctor and was told to come to the hospital. He denies trouble with facial drooping, weakness, slurred speech, numbness or tingling or ataxia. Patient states he only has the diplopia when both eyes are open. When one is closed the double vision goes away. Past Medical History  Diagnosis Date  . GERD (gastroesophageal reflux disease)   . Adenomatous polyps 03/2004  . Trigger finger (acquired)   . Coronary atherosclerosis of unspecified type of vessel, native or graft   . Hypertension   . Hyperlipidemia   . 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 Esophagitis 1993  . Sleep apnea   . Arthritis   . Myocardial infarction 1993  . Stroke 02/2014   Past Surgical History  Procedure Laterality Date  . Cholecystectomy  2002  . Rotator cuff repair  2009    right  . Lumbar fusion  1983  . Angioplasty  1993   Family History  Problem Relation Age of Onset  . Stroke Mother   . COPD Father   . Heart disease Sister   . Colon cancer Neg Hx    History  Substance Use Topics  . Smoking status: Former Research scientist (life sciences)  . Smokeless tobacco: Former Systems developer    Types: Chew     Comment: quit 50 years ago  . Alcohol Use: No    Review of Systems  All other systems reviewed and are negative.      Allergies  Penicillins  Home Medications   Prior to Admission medications   Medication Sig Start Date End Date Taking? Authorizing Provider  atenolol (TENORMIN) 25 MG tablet Take 1 tablet (25 mg total) by mouth daily. 01/08/15   Marin Olp, MD   atorvastatin (LIPITOR) 10 MG tablet TAKE ONE TABLET BY MOUTH ONCE DAILY. 08/05/14   Ricard Dillon, MD  clopidogrel (PLAVIX) 75 MG tablet Take 1 tablet (75 mg total) by mouth daily. 12/09/14   Marin Olp, MD  diphenhydramine-acetaminophen (TYLENOL PM) 25-500 MG TABS Take 1 tablet by mouth at bedtime as needed. sleep    Historical Provider, MD  fluticasone (FLONASE) 50 MCG/ACT nasal spray Place 1 spray into both nostrils daily.    Historical Provider, MD  loratadine (CLARITIN) 10 MG tablet Take 10 mg by mouth daily.    Historical Provider, MD  mercaptopurine (PURINETHOL) 50 MG tablet TAKE 1 AND 1/2 TABLETS DAILY.GIVE ON AN EMPTY STOMACH 1 HOUR BEFORE OR 2 HOURS AFTER MEAL. 10/02/14   Ladene Artist, MD  nitroGLYCERIN (NITROSTAT) 0.4 MG SL tablet Place 1 tablet (0.4 mg total) under the tongue every 5 (five) minutes as needed. 10/24/14   Marin Olp, MD  pantoprazole (PROTONIX) 40 MG tablet Take 1 tablet (40 mg total) by mouth daily. 02/17/14   Ladene Artist, MD  valsartan-hydrochlorothiazide (DIOVAN-HCT) 160-25 MG per tablet TAKE ONE TABLET DAILY.    Ricard Dillon, MD   BP 118/74 mmHg  Pulse 52  Temp(Src) 98.3 F (36.8 C) (Oral)  Resp 19  SpO2 96% Physical Exam  Constitutional: He is oriented to person, place, and time. He appears well-developed and well-nourished. No distress.  HENT:  Head: Normocephalic and atraumatic.  Right Ear: External ear normal.  Left Ear: External ear normal.  Mouth/Throat: Oropharynx is clear and moist.  Eyes: Conjunctivae are normal. Right eye exhibits no discharge. Left eye exhibits no discharge. No scleral icterus.  Neck: Neck supple. No tracheal deviation present.  Cardiovascular: Normal rate, regular rhythm and intact distal pulses.   Pulmonary/Chest: Effort normal and breath sounds normal. No stridor. No respiratory distress. He has no wheezes. He has no rales.  Abdominal: Soft. Bowel sounds are normal. He exhibits no distension. There is no  tenderness. There is no rebound and no guarding.  Musculoskeletal: He exhibits no edema or tenderness.  Neurological: He is alert and oriented to person, place, and time. He has normal strength. No cranial nerve deficit (no facial droop, extraocular movements intact, no slurred speech) or sensory deficit. He exhibits normal muscle tone. He displays no seizure activity. Coordination normal.  No pronator drift bilateral upper extrem, able to hold both legs off bed for 5 seconds, sensation intact in all extremities, no visual field cuts, no left or right sided neglect, abnormal finger-nose exam bilaterally, no nystagmus noted, nl heel shin NIHSS 1 for UE limb ataxia  Skin: Skin is warm and dry. No rash noted.  Psychiatric: He has a normal mood and affect.  Nursing note and vitals reviewed.   ED Course  Procedures (including critical care time) Labs Review Labs Reviewed  DIFFERENTIAL - Abnormal; Notable for the following:    Monocytes Relative 13 (*)    All other components within normal limits  COMPREHENSIVE METABOLIC PANEL - Abnormal; Notable for the following:    Potassium 3.3 (*)    CO2 33 (*)    Glucose, Bld 108 (*)    Albumin 3.4 (*)    GFR calc non Af Amer 54 (*)    GFR calc Af Amer 62 (*)    All other components within normal limits  URINALYSIS, ROUTINE W REFLEX MICROSCOPIC - Abnormal; Notable for the following:    Hgb urine dipstick SMALL (*)    All other components within normal limits  CBG MONITORING, ED - Abnormal; Notable for the following:    Glucose-Capillary 120 (*)    All other components within normal limits  PROTIME-INR  APTT  CBC  URINE RAPID DRUG SCREEN (HOSP PERFORMED)  ETHANOL  URINE MICROSCOPIC-ADD ON  I-STAT TROPOININ, ED    Imaging Review Ct Head Wo Contrast  01/15/2015   CLINICAL DATA:  Onset of blurry vision yesterday.  Double vision.  EXAM: CT HEAD WITHOUT CONTRAST  TECHNIQUE: Contiguous axial images were obtained from the base of the skull through  the vertex without intravenous contrast.  COMPARISON:  MRI 03/07/2014.  FINDINGS: No mass lesion, mass effect, midline shift, hydrocephalus, hemorrhage. No territorial ischemia or acute infarction. Periventricular white matter disease is present, likely chronic and ischemic. This appears similar to prior FLAIR imaging 03/07/2014. Calvarium intact. Scattered ethmoid mucosal sinus disease with opacification of many of the ethmoid air cells. LEFT frontal sinus disease.  IMPRESSION: 1. Mild chronic ischemic white matter disease. 2. Paranasal sinus disease.   Electronically Signed   By: Dereck Ligas M.D.   On: 01/15/2015 12:20     EKG Interpretation   Date/Time:  Thursday January 15 2015 11:07:25 EST Ventricular Rate:  55 PR Interval:  191 QRS Duration: 89  QT Interval:  413 QTC Calculation: 395 R Axis:   76 Text Interpretation:  Sinus rhythm Baseline wander in lead(s) V5 V6 No  significant change since last tracing Confirmed by Mercedez Boule  MD-J, Nyari Olsson  ([id number]) on 01/15/2015 11:20:54 AM      MDM     Discussed with Dr Leonel Ramsay.  Previously when patient had this event the MRI was negative. Carptid dopplers performed with out sig stenosis.  Pt has no other complaints in th ED today.  Will perform MRI of brain.  If negative, ok for outpatient follow up with Dr Tat and his ophthalmologist.  If positive will consult neurology.   Dorie Rank, MD 01/15/15 [phone]

## 2015-01-15 NOTE — Telephone Encounter (Signed)
[facility] Day - Client [place] [facility] Patient Name: [name] DOB: 1945/12/01 Initial Comment Caller states c/o blurry and double vision, has history of TIA's Nurse Assessment Nurse: Erlene Quan, RN, Manuela Schwartz Date/Time Eilene Ghazi Time): 01/15/2015 8:50:13 AM Confirm and document reason for call. If symptomatic, describe symptoms. ---Caller states c/o blurry and double vision - sudden onset this morning - has a history of blockage behind his eye last April - the doctor said it was like a mini stroke behind his eye - today his L eye is worse but both eyes are effected - denies any flashes of light - no curtain covering vision - but he is looking at the TV and he see 2 of everything and very blurred Has the patient traveled out of the country within the last 30 days? ---Not Applicable Does the patient require triage? ---Yes Related visit to physician within the last 2 weeks? ---No Does the PT have any chronic conditions? (i.e. diabetes, asthma, etc.) ---Yes List chronic conditions. ---high blood pressure on blood thinner Guidelines Guideline Title Affirmed Question Affirmed Notes Vision Loss or Change Double vision Final Disposition User Go to ED Now (or PCP triage) Erlene Quan, RN, Grier Mitts Advised [name] to have his wife drive him to ER now - and to take the information with him he has from the [facility] he was seen at when he had the mini strokes behind the eye before - he has the doctors name address and phone number and also the diagnosis and treatment - advised him the ER physicians may wish to contact the [facility] regarding his symptoms

## 2015-01-15 NOTE — ED Notes (Signed)
Pt. Returned from MRI.

## 2015-01-15 NOTE — Telephone Encounter (Signed)
FYI

## 2015-01-16 ENCOUNTER — Telehealth: Payer: Self-pay | Admitting: Neurology

## 2015-01-16 DIAGNOSIS — H43812 Vitreous degeneration, left eye: Secondary | ICD-10-CM | POA: Diagnosis not present

## 2015-01-16 DIAGNOSIS — H4922 Sixth [abducent] nerve palsy, left eye: Secondary | ICD-10-CM | POA: Diagnosis not present

## 2015-01-16 DIAGNOSIS — H532 Diplopia: Secondary | ICD-10-CM | POA: Diagnosis not present

## 2015-01-16 DIAGNOSIS — H2511 Age-related nuclear cataract, right eye: Secondary | ICD-10-CM | POA: Diagnosis not present

## 2015-01-16 NOTE — Telephone Encounter (Signed)
Please call pt to schedule appointment. We will need to call his PCP office for a [facility] referral. / Gayleen Orem

## 2015-01-16 NOTE — Telephone Encounter (Signed)
Pt is coming in on Monday 01-19-15 at 10:45 to see Dr Tat and I called to get the [facility] Referral

## 2015-01-16 NOTE — Telephone Encounter (Signed)
Saw that pt in ER.  Should probably make a f/u appt with me as hasn't been seen in a year.  Will need PCP approval I think with [facility].

## 2015-01-19 ENCOUNTER — Telehealth: Payer: Self-pay | Admitting: Family Medicine

## 2015-01-19 ENCOUNTER — Ambulatory Visit (INDEPENDENT_AMBULATORY_CARE_PROVIDER_SITE_OTHER): Payer: Commercial Managed Care - HMO | Admitting: Neurology

## 2015-01-19 ENCOUNTER — Other Ambulatory Visit: Payer: Commercial Managed Care - HMO

## 2015-01-19 ENCOUNTER — Encounter: Payer: Self-pay | Admitting: Neurology

## 2015-01-19 VITALS — BP 90/60 | HR 80 | Ht 68.0 in | Wt 225.0 lb

## 2015-01-19 DIAGNOSIS — H532 Diplopia: Secondary | ICD-10-CM | POA: Diagnosis not present

## 2015-01-19 DIAGNOSIS — R739 Hyperglycemia, unspecified: Secondary | ICD-10-CM

## 2015-01-19 DIAGNOSIS — H4922 Sixth [abducent] nerve palsy, left eye: Secondary | ICD-10-CM

## 2015-01-19 DIAGNOSIS — R7309 Other abnormal glucose: Secondary | ICD-10-CM | POA: Diagnosis not present

## 2015-01-19 NOTE — Progress Notes (Signed)
[name] was seen today in neurologic consultation at the request of Garret Reddish, MD.  The consultation is for the evaluation of diplopia.  The records that were made available to me were reviewed.  This patient is accompanied in the office by his spouse who supplements the history.   The patient is a 69 y.o. year old R handed W male with a history of visual changes.  I reviewed emergency records from April 16.  According to emergency room records the patient was complaining of "blurred vision" and weakness and dizziness for approximately 12 hours.  A CT of the brain was performed on 02/27/2014.  Pt states early in the day, before he went to the ED, he took the dog outside and noted blurry vision, although not really double.  That day, he denied diplopia but his wife recalls that he was watching a TV show and he leaned his head to the right and he had diplopia.  I reviewed these films myself.  This is unremarkable, with the exception of pansinusitis.  He was placed on levaquin for that.  He f/u with Bebe Liter at the PCP office and a referral was made here.  Currently, we are 8 days out from the onset, and sx's are the same.  If he tilts the head to the right, he has the diplopia.  If he closes an eye (either eye), it gets better.  No ptosis.  No headache.  No DM.  No difficulty walking.  No lateralizing paresthesias or weakness, unless sleeps wrong on the R arm.  No hx of similar.  No SOB.  No difficulty climbing stairs.  Is on ASA 81 mg and has been on that for a long time.  01/19/15 update:  The patient presents today, after having not been seen for approximately one year.  He is accompanied by his wife who supplements the history.  When I saw him 1 year ago he had an episode of diplopia and subsequently had a carotid ultrasound on 03/12/2014.  This was normal, demonstrating 1-39% stenosis bilaterally.  The patient did see the ophthalmologist at [facility] eye care and his examination there was  unremarkable.  I did not get a copy of those records.  He has been on Plavix ever since.  His diplopia resolved in 6 weeks.    He did well until the morning of 01/14/2015 and he began to have diplopia again.  He was at a funeral and he began to have the diplopia.   On the morning of 01/15/2015 he presented to the emergency room.  If he closes an eye, the diplopia would go away.  It did not matter which eye he closed.  It is horizontal but if he tilts the head it will go up and down.  If he lays down, it is better.  No drooping of the eyelid.  No SOB.  No DM.  He had an MRI of the brain in the emergency room that had the opportunity to review.  While there were multiple scattered T2 hyperintensities, none of these were new compared to last year.  Diffusion-weighted imaging was negative.  He went to the eye doctor on Friday and had blood work.  He doesn't know what labs were checked.  I was able to get a copy of the lab work just before he left and a sedimentation rate was checked and it was 4 and a CRP was normal at less than 0.5.   PREVIOUS MEDICATIONS: n/a  ALLERGIES:   Allergies  Allergen Reactions  . Penicillins     REACTION: rash    CURRENT MEDICATIONS:  Current Outpatient Prescriptions on File Prior to Visit  Medication Sig Dispense Refill  . atenolol (TENORMIN) 25 MG tablet Take 1 tablet (25 mg total) by mouth daily. 90 tablet 1  . atorvastatin (LIPITOR) 10 MG tablet TAKE ONE TABLET BY MOUTH ONCE DAILY. 30 tablet 6  . clopidogrel (PLAVIX) 75 MG tablet Take 1 tablet (75 mg total) by mouth daily. 30 tablet 11  . fluticasone (FLONASE) 50 MCG/ACT nasal spray Place 1 spray into both nostrils daily.    Marland Kitchen loratadine (CLARITIN) 10 MG tablet Take 10 mg by mouth daily.    . mercaptopurine (PURINETHOL) 50 MG tablet TAKE 1 AND 1/2 TABLETS DAILY.GIVE ON AN EMPTY STOMACH 1 HOUR BEFORE OR 2 HOURS AFTER MEAL. 45 tablet 6  . pantoprazole (PROTONIX) 40 MG tablet Take 1 tablet (40 mg total) by mouth daily.  30 tablet 5  . valsartan-hydrochlorothiazide (DIOVAN-HCT) 160-25 MG per tablet TAKE ONE TABLET DAILY. 30 tablet 11  . nitroGLYCERIN (NITROSTAT) 0.4 MG SL tablet Place 1 tablet (0.4 mg total) under the tongue every 5 (five) minutes as needed. (Patient not taking: Reported on 01/19/2015) 30 tablet 3   No current facility-administered medications on file prior to visit.    PAST MEDICAL HISTORY:   Past Medical History  Diagnosis Date  . GERD (gastroesophageal reflux disease)   . Adenomatous polyps 03/2004  . Trigger finger (acquired)   . Coronary atherosclerosis of unspecified type of vessel, native or graft   . Hypertension   . Hyperlipidemia   . 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 Esophagitis 1993  . Sleep apnea   . Arthritis   . Myocardial infarction 1993  . Stroke 02/2014    PAST SURGICAL HISTORY:   Past Surgical History  Procedure Laterality Date  . Cholecystectomy  2002  . Rotator cuff repair  2009    right  . Lumbar fusion  1983  . Angioplasty  1993    SOCIAL HISTORY:   History   Social History  . Marital Status: Married    Spouse Name: N/A  . Number of Children: 3  . Years of Education: N/A   Occupational History  . apartment maintenance     retired   Social History Main Topics  . Smoking status: Former Research scientist (life sciences)  . Smokeless tobacco: Former Systems developer    Types: Chew     Comment: quit 50 years ago  . Alcohol Use: No  . Drug Use: No  . Sexual Activity: Not on file   Other Topics Concern  . Not on file   Social History Narrative   Pt does not get regular exercise--daily caffeine use-5 cups daily    FAMILY HISTORY:   Family Status  Relation Status Death Age  . Mother Deceased     CVA  . Father Deceased     COPD  . Brother Alive     CAD  . Sister Deceased     CAD  . Child Alive     3, one with MS    ROS:  A complete 10 system review of systems was obtained and was unremarkable apart from what is mentioned above.  PHYSICAL EXAMINATION:      VITALS:   Filed Vitals:   01/19/15 1021  BP: 90/60  Pulse: 80  Height: 5' 8"  (1.727 m)  Weight: 225 lb (102.059 kg)    GEN:  Normal appears male in no acute distress.  Appears stated age. HEENT:  Normocephalic, atraumatic. The mucous membranes are moist. The superficial temporal arteries are without ropiness or tenderness. Cardiovascular: Regular rate and rhythm. Lungs: Clear to auscultation bilaterally. Neck/Heme: There are no carotid bruits noted bilaterally.  NEUROLOGICAL: Orientation:  The patient is alert and oriented x 3.  Fund of knowledge is appropriate.  Recent and remote memory intact.  Attention span and concentration normal.  Repeats and names without difficulty. Cranial nerves: There is good facial symmetry. The pupils are equal round and reactive to light bilaterally. Fundoscopic exam reveals clear disc margins bilaterally.  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  There is no ptosis.  There is no INO.  The visual fields are full to confrontational testing. Speech is fluent and clear. Soft palate rises symmetrically and there is no tongue deviation. Hearing is intact to conversational tone. Tone: Tone is good throughout. Sensation: Sensation is intact to light touch and pinprick throughout (facial, trunk, extremities). Vibration is decreased at the bilateral big toe. There is no extinction with double simultaneous stimulation. There is no sensory dermatomal level identified. Coordination:  The patient has no difficulty with RAM's or FNF bilaterally. Motor: Strength is 5/5 in the bilateral upper and lower extremities.  Shoulder shrug is equal and symmetric. There is no pronator drift.  There are no fasciculations noted. DTR's: Deep tendon reflexes are 2/4 at the bilateral biceps, triceps, brachioradialis, patella and 1/4 at the bilateral achilles.  Plantar responses are downgoing bilaterally. Gait and Station: The patient ambulates with a wide-based gait.  He has some difficulty  ambulating in a tandem fashion.  He can stand in Romberg position with eyes open, but sways with eyes closed.  LABS  Lab Results  Component Value Date   WBC 6.6 01/15/2015   HGB 14.6 01/15/2015   HCT 43.4 01/15/2015   MCV 85.3 01/15/2015   PLT 206 01/15/2015     Chemistry      Component Value Date/Time   NA 137 01/15/2015 1106   K 3.3* 01/15/2015 1106   CL 99 01/15/2015 1106   CO2 33* 01/15/2015 1106   BUN 10 01/15/2015 1106   CREATININE 1.33 01/15/2015 1106      Component Value Date/Time   CALCIUM 8.9 01/15/2015 1106   ALKPHOS 71 01/15/2015 1106   AST 27 01/15/2015 1106   ALT 31 01/15/2015 1106   BILITOT 0.6 01/15/2015 1106     Lab Results  Component Value Date   HGBA1C 6.1* 03/07/2014   Lab Results  Component Value Date   TSH 1.11 10/02/2014   Lab Results  Component Value Date   VITAMINB12 435 03/07/2014     IMPRESSION/PLAN  1. 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  His workups have been unremarkable.  I am quite suspicious for diabetes, even though his last hemoglobin A1c was borderline at 6.1.  This was done about a year ago and we will repeat.  We will check other lab work as well including RPR, ANA, Ace and acetylcholine receptor antibodies.  -I will recommend that he have a contrasted MRI of the brain.  I cannot order this myself given his type of insurance, but will request this from his primary care physician.  -Continue Plavix  -We will call him with the results of the above.  He is to let me know  if symptoms worsen or if new symptoms develop.

## 2015-01-19 NOTE — Patient Instructions (Signed)
1. Your provider has requested that you have labwork completed today. Please go to [facility] lab.  2. Your primary care doctor should be contacting you about scheduling a CT scan with contrast.

## 2015-01-19 NOTE — Telephone Encounter (Signed)
Received information from Dr. Carles Collet that patient has had 2 episodes of diplopia. I will order a MRI with contrast.   Did not see order other than MR MRA Head W contrast. Will forward this to Dr. Carles Collet to make sure correct order. Did not see MR without MRA.   Creatinine/BUN look ok for exam as ordered on 01/15/15

## 2015-01-20 LAB — HEMOGLOBIN A1C
Hgb A1c MFr Bld: 6.2 % — ABNORMAL HIGH (ref <5.7)
Mean Plasma Glucose: 131 mg/dL — ABNORMAL HIGH (ref <117)

## 2015-01-20 LAB — ANA: ANA: NEGATIVE

## 2015-01-20 LAB — RPR

## 2015-01-20 LAB — ANGIOTENSIN CONVERTING ENZYME: ANGIOTENSIN-CONVERTING ENZYME: 70 U/L — AB (ref 8–52)

## 2015-01-20 NOTE — Telephone Encounter (Signed)
He had MR brain on 3/3 without gad and I need MRI brain with gad now just to make sure not missing anything.

## 2015-01-21 NOTE — Addendum Note (Signed)
Addended by: Marin Olp on: 01/21/2015 08:36 AM   Modules accepted: Orders

## 2015-01-21 NOTE — Telephone Encounter (Signed)
[name] I need your help on this. I had ordered MRI head when I looked back this needs to be MRI brain with contrast. I was sent an order to sign stating MRA head without contrast and this is NOT what we need. Need MRI brain with contrast. I reordered and cancelled orders for MRA without contrast. Just want to see if you can make sure [facility] radiology understands this.

## 2015-01-23 ENCOUNTER — Telehealth: Payer: Self-pay

## 2015-01-23 NOTE — Telephone Encounter (Signed)
Per Dr. Yong Channel cancel the MRA Brain.  Spoke with Anderson Malta and she is aware of the information.  Anderson Malta will call pt to reschedule appt. MRA will be cancelled and pt's MRI will be changed back to MRI Brain with contrast.

## 2015-01-23 NOTE — Telephone Encounter (Signed)
Anderson Malta from [facility] called and states pt was orgininally scheduled for an MRI Brain with contrast but the tech changed it to MRI Brain with and without contrast because that is their protocol.  Anderson Malta would like to clarify if there is specific reason Dr. Yong Channel wanted an MRI Brain with contrast and to see if [facility] could be contacted since it is still pending.  Per Dr. Yong Channel pt needs an MRI with contrast because on 3.3.16 pt had an MRI Brain without contrast and this the recommendation of the neurologist.   Derinda Late called [facility] and was told that pt's MRI with contrast is in colinical review since pt just had an MRI Brain without contrast.  This process usually takes 2 to 3 days and if additional information is needed the office will be contacted.   Left a message for Anderson Malta to return call.

## 2015-01-24 ENCOUNTER — Other Ambulatory Visit: Payer: Commercial Managed Care - HMO

## 2015-01-26 ENCOUNTER — Inpatient Hospital Stay: Admission: RE | Admit: 2015-01-26 | Payer: Commercial Managed Care - HMO | Source: Ambulatory Visit

## 2015-01-26 ENCOUNTER — Other Ambulatory Visit: Payer: Commercial Managed Care - HMO

## 2015-01-27 ENCOUNTER — Telehealth: Payer: Self-pay | Admitting: Family Medicine

## 2015-01-27 NOTE — Telephone Encounter (Signed)
Anderson Malta call from [place] want to check on a [employer] referral that Neoma Laming is working on, the pt has a Arts development officer. Anderson Malta would like a call back with status.# is [phone]

## 2015-01-27 NOTE — Telephone Encounter (Signed)
Called [facility] and spoke with Merrilyn Puma and she states that the approved status for the MRI Brain with contrast(70552) is [id number].  Merrilyn Puma will fax over the approval again.

## 2015-01-27 NOTE — Telephone Encounter (Signed)
Called and spoke with [name] and she is aware.  Approval number verified.

## 2015-01-28 ENCOUNTER — Ambulatory Visit
Admission: RE | Admit: 2015-01-28 | Discharge: 2015-01-28 | Disposition: A | Discharge status: Home / Self Care | Payer: Commercial Managed Care - HMO | Source: Ambulatory Visit | Attending: Family Medicine | Admitting: Family Medicine

## 2015-01-28 DIAGNOSIS — H532 Diplopia: Secondary | ICD-10-CM

## 2015-01-28 MED ORDER — GADOBENATE DIMEGLUMINE 529 MG/ML IV SOLN: 20.0000 mL | Freq: Once | INTRAVENOUS | Status: AC | PRN

## 2015-01-29 LAB — MYASTHENIA GRAVIS PANEL 2
ACETYLCHOLINE REC MOD AB: 8 %
Acetylcholine Rec Binding: <0.3 nmol/L
Aceytlcholine Rec Bloc Ab: <15 % of inhibition (ref <15)

## 2015-01-30 ENCOUNTER — Other Ambulatory Visit: Payer: Commercial Managed Care - HMO

## 2015-01-30 ENCOUNTER — Telehealth: Payer: Self-pay | Admitting: Neurology

## 2015-01-30 DIAGNOSIS — H9311 Tinnitus, right ear: Secondary | ICD-10-CM

## 2015-01-30 NOTE — Telephone Encounter (Signed)
Spoke with the patient and gave him his lab results.  I do think that diplopia is likely ischemic from borderline DM and talked about proper diet but told him about mildly elevated ACE.  Talked about LP.  He wanted to think it over.  Talked about r/b/se and need to be off of Plavix for it.  Will likely need PCP to order it if decides to proceed with his insurance type.  As I was hanging up patient says, "oh, by the way, for the last few days I have had a heartbeat I can hear out of my ear on the right."  Sounds like describing pulsatile tinnitus.  Probably does need MRA now.  Saw that Dr. Yong Channel was going to order previously but then I didn't need it before and it was cancelled.  Will discuss with him.

## 2015-01-31 NOTE — Addendum Note (Signed)
Addended by: Marin Olp on: 01/31/2015 03:27 PM   Modules accepted: Orders

## 2015-01-31 NOTE — Telephone Encounter (Signed)
Ordered Mr MRA HEAD W Contrast.  Will ask Dr. Carles Collet to review to make sure right order for pulsatile tinnitus

## 2015-02-02 NOTE — Telephone Encounter (Signed)
yes

## 2015-02-04 ENCOUNTER — Ambulatory Visit (INDEPENDENT_AMBULATORY_CARE_PROVIDER_SITE_OTHER): Payer: Commercial Managed Care - HMO | Admitting: Family Medicine

## 2015-02-04 ENCOUNTER — Encounter: Payer: Self-pay | Admitting: Family Medicine

## 2015-02-04 VITALS — BP 120/70 | HR 64 | Temp 97.9°F | Wt 228.0 lb

## 2015-02-04 DIAGNOSIS — R609 Edema, unspecified: Secondary | ICD-10-CM | POA: Diagnosis not present

## 2015-02-04 NOTE — Progress Notes (Signed)
Garret Reddish, MD Phone: [phone]  Subjective:   [name] is a 69 y.o. year old very pleasant male patient who presents with the following:  Edema-slightly worsening -slightly worse since December. Feels like gaining weight (but also sedentary since eye difficulties started). No history of anemia or thyroid issues. CKD stage III noted. No history CHF but does have CAD and no recent echo.  ROS- no chest pain, shortness of breath, orthopnea, PND.   Pulsatile Tinnitus X 1 week. Not worsening. Seems to be most of the day unless he puts a cotton ball in ear. MRA already ordered and has already seen neurology.   Past Medical History- Patient Active Problem List   Diagnosis Date Noted  . ULCERATIVE COLITIS-LEFT SIDE 07/03/2008    Priority: High  . CAD (coronary artery disease) 06/15/2007    Priority: High  . CKD (chronic kidney disease), stage III 10/24/2014    Priority: Medium  . Sleep apnea 07/02/2008    Priority: Medium  . Hyperlipidemia 06/14/2007    Priority: Medium  . Essential hypertension 06/14/2007    Priority: Medium  . Allergic rhinitis 10/24/2014    Priority: Low  . Obesity (BMI 30-39.9) 04/25/2014    Priority: Low  . Right cervical radiculopathy 04/12/2013    Priority: Low  . Low back pain 01/11/2008    Priority: Low  . GERD 11/07/2007    Priority: Low  . Trigger finger, acquired 10/23/2007    Priority: Low  . Edema 02/04/2015  . Personal History of Colonic Polyps 06/14/2007   Medications- reviewed and updated Current Outpatient Prescriptions  Medication Sig Dispense Refill  . atenolol (TENORMIN) 25 MG tablet Take 1 tablet (25 mg total) by mouth daily. 90 tablet 1  . atorvastatin (LIPITOR) 10 MG tablet TAKE ONE TABLET BY MOUTH ONCE DAILY. 30 tablet 6  . clopidogrel (PLAVIX) 75 MG tablet Take 1 tablet (75 mg total) by mouth daily. 30 tablet 11  . loratadine (CLARITIN) 10 MG tablet Take 10 mg by mouth daily.    . mercaptopurine (PURINETHOL) 50  MG tablet TAKE 1 AND 1/2 TABLETS DAILY.GIVE ON AN EMPTY STOMACH 1 HOUR BEFORE OR 2 HOURS AFTER MEAL. 45 tablet 6  . pantoprazole (PROTONIX) 40 MG tablet Take 1 tablet (40 mg total) by mouth daily. 30 tablet 5  . valsartan-hydrochlorothiazide (DIOVAN-HCT) 160-25 MG per tablet TAKE ONE TABLET DAILY. 30 tablet 11  . fluticasone (FLONASE) 50 MCG/ACT nasal spray Place 1 spray into both nostrils daily.    . nitroGLYCERIN (NITROSTAT) 0.4 MG SL tablet Place 1 tablet (0.4 mg total) under the tongue every 5 (five) minutes as needed. (Patient not taking: Reported on 01/19/2015) 30 tablet 3   No current facility-administered medications for this visit.    Objective: BP 120/70 mmHg  Pulse 64  Temp(Src) 97.9 F (36.6 C)  Wt 228 lb (103.42 kg) Gen: NAD, resting comfortably in chair ENT: TM normal, no palpable abnormality on external ear or face CV: RRR no murmurs rubs or gallops Lungs: CTAB no crackles, wheeze, rhonchi Abdomen: soft/nontender/nondistended/normal bowel sounds.  Ext: 1+edema (slightly worse), no pitting edema in hands.  Skin: warm, dry   Assessment/Plan:  Edema No history of anemia or thyroid issues. CKD stage III noted. No history CHF but does have CAD and no recent echo. -we discussed obtaining echocardiogram today but patient declines and wants to work up pulsatile tinnitus first.    Pulsatile Tinnitus Discussed neurology recommendations for MRA and that I had entered order and agreed  that he should do this done. Still on the fence about LP and will decide after follow up with Dr. Carles Collet after MRA.   Return precautions advised.

## 2015-02-04 NOTE — Assessment & Plan Note (Signed)
No history of anemia or thyroid issues. CKD stage III noted. No history CHF but does have CAD and no recent echo. -we discussed obtaining echocardiogram today but patient declines and wants to work up pulsatile tinnitus first.

## 2015-02-04 NOTE — Patient Instructions (Addendum)
Will get the last pneumonia shot ([id number]) at physical in July.  You do need the MRA of your head now. We are trying to figure out why this has not been set up yet. It seems they called you to schedule. Call this # [phone] [facility].   When things settle down from the ear and eye, you can call me and I can order an echocardiogram to follow up on your swelling.

## 2015-02-09 ENCOUNTER — Ambulatory Visit
Admission: RE | Admit: 2015-02-09 | Discharge: 2015-02-09 | Disposition: A | Discharge status: Home / Self Care | Payer: Commercial Managed Care - HMO | Source: Ambulatory Visit | Attending: Family Medicine | Admitting: Family Medicine

## 2015-02-09 ENCOUNTER — Telehealth: Payer: Self-pay | Admitting: Neurology

## 2015-02-09 DIAGNOSIS — H532 Diplopia: Secondary | ICD-10-CM

## 2015-02-09 DIAGNOSIS — H9311 Tinnitus, right ear: Secondary | ICD-10-CM | POA: Diagnosis not present

## 2015-02-09 NOTE — Telephone Encounter (Signed)
Patient made aware.

## 2015-02-09 NOTE — Telephone Encounter (Signed)
-----   Message from Alda Berthold, DO sent at 02/09/2015 11:04 AM EDT ----- Please inform patient imaging of the blood vessels looks good. Thanks.

## 2015-02-18 ENCOUNTER — Other Ambulatory Visit: Payer: Commercial Managed Care - HMO

## 2015-02-27 DIAGNOSIS — H532 Diplopia: Secondary | ICD-10-CM | POA: Diagnosis not present

## 2015-02-27 DIAGNOSIS — H4922 Sixth [abducent] nerve palsy, left eye: Secondary | ICD-10-CM | POA: Diagnosis not present

## 2015-02-27 DIAGNOSIS — H43812 Vitreous degeneration, left eye: Secondary | ICD-10-CM | POA: Diagnosis not present

## 2015-03-03 ENCOUNTER — Other Ambulatory Visit: Payer: Self-pay | Admitting: Gastroenterology

## 2015-03-05 ENCOUNTER — Other Ambulatory Visit: Payer: Self-pay | Admitting: *Deleted

## 2015-03-05 MED ORDER — ATORVASTATIN CALCIUM 10 MG PO TABS: 10.0000 mg | ORAL_TABLET | Freq: Every day | ORAL | Status: DC

## 2015-04-16 ENCOUNTER — Telehealth: Payer: Self-pay | Admitting: Family Medicine

## 2015-04-16 DIAGNOSIS — R609 Edema, unspecified: Secondary | ICD-10-CM

## 2015-04-16 NOTE — Telephone Encounter (Signed)
Pt called to say that he had a discussion with Dr Yong Channel about  Echocardiogram and was told to call once his eyes were better. Pt called today to say that he would like to have the Echocardiogram done

## 2015-04-16 NOTE — Telephone Encounter (Signed)
Complete.  I usually order with contrast as my understanding is they will not use it if not needed.  Under edema for this patient

## 2015-04-16 NOTE — Telephone Encounter (Signed)
I tried ordering this but I am unsure if pt needs with or without enhanching and is this limited or complete?

## 2015-04-17 NOTE — Telephone Encounter (Signed)
ECHO ordered

## 2015-04-27 ENCOUNTER — Ambulatory Visit (HOSPITAL_COMMUNITY)
Admission: RE | Admit: 2015-04-27 | Discharge: 2015-04-27 | Disposition: A | Discharge status: Home / Self Care | Payer: Commercial Managed Care - HMO | Source: Ambulatory Visit | Attending: Family Medicine | Admitting: Family Medicine

## 2015-04-27 DIAGNOSIS — R609 Edema, unspecified: Secondary | ICD-10-CM | POA: Diagnosis not present

## 2015-04-28 ENCOUNTER — Ambulatory Visit: Payer: Medicare HMO | Admitting: Family Medicine

## 2015-05-26 ENCOUNTER — Telehealth: Payer: Self-pay | Admitting: Family Medicine

## 2015-05-26 DIAGNOSIS — H532 Diplopia: Secondary | ICD-10-CM

## 2015-05-26 NOTE — Telephone Encounter (Addendum)
Pt has f/u with Dr. Frederico Hamman at [facility] [phone] for the double vision and needs a referral put in. Appt is 05/29/15

## 2015-05-26 NOTE — Telephone Encounter (Signed)
Order for referral put in EPIC.

## 2015-05-29 DIAGNOSIS — H2511 Age-related nuclear cataract, right eye: Secondary | ICD-10-CM | POA: Diagnosis not present

## 2015-05-29 DIAGNOSIS — H43812 Vitreous degeneration, left eye: Secondary | ICD-10-CM | POA: Diagnosis not present

## 2015-06-08 ENCOUNTER — Encounter: Payer: Self-pay | Admitting: Family Medicine

## 2015-06-08 ENCOUNTER — Ambulatory Visit (INDEPENDENT_AMBULATORY_CARE_PROVIDER_SITE_OTHER): Payer: Commercial Managed Care - HMO | Admitting: Family Medicine

## 2015-06-08 VITALS — BP 118/74 | HR 58 | Temp 98.3°F | Wt 220.0 lb

## 2015-06-08 DIAGNOSIS — R319 Hematuria, unspecified: Secondary | ICD-10-CM | POA: Diagnosis not present

## 2015-06-08 DIAGNOSIS — E785 Hyperlipidemia, unspecified: Secondary | ICD-10-CM

## 2015-06-08 DIAGNOSIS — R739 Hyperglycemia, unspecified: Secondary | ICD-10-CM | POA: Insufficient documentation

## 2015-06-08 DIAGNOSIS — I2510 Atherosclerotic heart disease of native coronary artery without angina pectoris: Secondary | ICD-10-CM

## 2015-06-08 DIAGNOSIS — I10 Essential (primary) hypertension: Secondary | ICD-10-CM | POA: Diagnosis not present

## 2015-06-08 DIAGNOSIS — L570 Actinic keratosis: Secondary | ICD-10-CM

## 2015-06-08 DIAGNOSIS — Z0000 Encounter for general adult medical examination without abnormal findings: Secondary | ICD-10-CM | POA: Diagnosis not present

## 2015-06-08 DIAGNOSIS — Z23 Encounter for immunization: Secondary | ICD-10-CM | POA: Diagnosis not present

## 2015-06-08 DIAGNOSIS — J302 Other seasonal allergic rhinitis: Secondary | ICD-10-CM

## 2015-06-08 DIAGNOSIS — N183 Chronic kidney disease, stage 3 unspecified: Secondary | ICD-10-CM

## 2015-06-08 DIAGNOSIS — Z87891 Personal history of nicotine dependence: Secondary | ICD-10-CM | POA: Diagnosis not present

## 2015-06-08 LAB — LIPID PANEL
CHOL/HDL RATIO: 4
CHOLESTEROL: 101 mg/dL (ref 0–200)
HDL: 25.9 mg/dL — AB (ref >39.00)
LDL Cholesterol: 49 mg/dL (ref 0–99)
NonHDL: 75.1
Triglycerides: 131 mg/dL (ref 0.0–149.0)
VLDL: 26.2 mg/dL (ref 0.0–40.0)

## 2015-06-08 LAB — CBC
HEMATOCRIT: 46.1 % (ref 39.0–52.0)
Hemoglobin: 15.5 g/dL (ref 13.0–17.0)
MCHC: 33.5 g/dL (ref 30.0–36.0)
MCV: 86.1 fl (ref 78.0–100.0)
Platelets: 228 10*3/uL (ref 150.0–400.0)
RBC: 5.35 Mil/uL (ref 4.22–5.81)
RDW: 16.4 % — ABNORMAL HIGH (ref 11.5–15.5)
WBC: 6.4 10*3/uL (ref 4.0–10.5)

## 2015-06-08 LAB — COMPREHENSIVE METABOLIC PANEL
ALT: 25 U/L (ref 0–53)
AST: 20 U/L (ref 0–37)
Albumin: 4 g/dL (ref 3.5–5.2)
Alkaline Phosphatase: 61 U/L (ref 39–117)
BILIRUBIN TOTAL: 0.5 mg/dL (ref 0.2–1.2)
BUN: 14 mg/dL (ref 6–23)
CALCIUM: 9.6 mg/dL (ref 8.4–10.5)
CO2: 30 meq/L (ref 19–32)
CREATININE: 1.34 mg/dL (ref 0.40–1.50)
Chloride: 103 mEq/L (ref 96–112)
GFR: 56.33 mL/min — ABNORMAL LOW (ref >60.00)
GLUCOSE: 105 mg/dL — AB (ref 70–99)
POTASSIUM: 3.8 meq/L (ref 3.5–5.1)
Sodium: 141 mEq/L (ref 135–145)
Total Protein: 7.4 g/dL (ref 6.0–8.3)

## 2015-06-08 LAB — POCT URINALYSIS DIPSTICK
Bilirubin, UA: NEGATIVE
GLUCOSE UA: NEGATIVE
KETONES UA: NEGATIVE
Leukocytes, UA: NEGATIVE
Nitrite, UA: NEGATIVE
PROTEIN UA: NEGATIVE
Spec Grav, UA: 1.02
Urobilinogen, UA: 0.2
pH, UA: 6

## 2015-06-08 LAB — URINALYSIS, MICROSCOPIC ONLY: RBC / HPF: NONE SEEN (ref 0–?)

## 2015-06-08 LAB — TSH: TSH: 1.31 u[IU]/mL (ref 0.35–4.50)

## 2015-06-08 NOTE — Patient Instructions (Addendum)
We will call you within a week about your referral to dermatology for follow up of spots on your back, spot on your neck, sun damage on face. If you do not hear within 2 weeks, give [place] a call.   No changes to medicine today  Let's check in 6 months from now with physical again in a year unless you need me sooner  Labs before you leave

## 2015-06-08 NOTE — Assessment & Plan Note (Signed)
S:GFR 55-65. Stable on repeat. Avoids nsaids. BP and lipids controlled and already on ace-i A/P: continue current management

## 2015-06-08 NOTE — Progress Notes (Addendum)
[name] Reddish, MD Phone: [phone]  Subjective:  Patient presents today for their annual physical and to establish care as prior Dr. Arnoldo Morale patient. Chief complaint-noted.   Overall doing well. Still having issues with double vision but neurology and optho workups have been normal. Thinkg sthey occurred with flonase. Has nto reused and plans not to. Has nto had any chest pain or shortness of breath in regards to CAD.   HLD- compliant with atorvastation  Compliant with CPAP  Compliant with BP meds and controlled  CKD III stable on recheck today  At risk diabetes ROS- full  review of systems was completed and negative except for: intermittent double vision has happened twice but with episodes for several weeks  The following were reviewed and entered/updated in epic: Past Medical History  Diagnosis Date  . GERD (gastroesophageal reflux disease)   . Adenomatous polyps 03/2004, 11/2012  . Trigger finger (acquired)   . Coronary atherosclerosis of unspecified type of vessel, native or graft   . Hypertension   . Hyperlipidemia   . 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 Esophagitis 1993  . Sleep apnea     cpap  . Arthritis   . Myocardial infarction 1993  . Stroke 02/2014   Patient Active Problem List   Diagnosis Date Noted  . ULCERATIVE COLITIS-LEFT SIDE 07/03/2008    Priority: High  . CAD (coronary artery disease) 06/15/2007    Priority: High  . Hyperglycemia 06/08/2015    Priority: Medium  . CKD (chronic kidney disease), stage III 10/24/2014    Priority: Medium  . Sleep apnea 07/02/2008    Priority: Medium  . Hyperlipidemia 06/14/2007    Priority: Medium  . Essential hypertension 06/14/2007    Priority: Medium  . Former smoker 06/08/2015    Priority: Low  . Allergic rhinitis 10/24/2014    Priority: Low  . Obesity (BMI 30-39.9) 04/25/2014    Priority: Low  . Right cervical radiculopathy 04/12/2013    Priority: Low  . Low back pain 01/11/2008   Priority: Low  . GERD 11/07/2007    Priority: Low  . Trigger finger, acquired 10/23/2007    Priority: Low  . History of colonic polyps 06/14/2007    Priority: Low  . Edema 02/04/2015   Past Surgical History  Procedure Laterality Date  . Cholecystectomy  2002  . Rotator cuff repair  2009    right  . Lumbar fusion  1983  . Angioplasty  1993  . Trigger finger release      right    Family History  Problem Relation Age of Onset  . Stroke Mother   . COPD Father   . Heart disease Sister     valve replacement  . Colon cancer Neg Hx   . CAD Brother     Medications- reviewed and updated Current Outpatient Prescriptions  Medication Sig Dispense Refill  . atenolol (TENORMIN) 25 MG tablet Take 1 tablet (25 mg total) by mouth daily. 90 tablet 1  . atorvastatin (LIPITOR) 10 MG tablet Take 1 tablet (10 mg total) by mouth daily. 30 tablet 6  . clopidogrel (PLAVIX) 75 MG tablet Take 1 tablet (75 mg total) by mouth daily. 30 tablet 11  . loratadine (CLARITIN) 10 MG tablet Take 10 mg by mouth daily.    . mercaptopurine (PURINETHOL) 50 MG tablet TAKE 1 AND 1/2 TABLETS DAILY.GIVE ON AN EMPTY STOMACH 1 HOUR BEFORE OR 2 HOURS AFTER MEAL. 45 tablet 6  . pantoprazole (PROTONIX) 40 MG tablet TAKE ONE TABLET  BY MOUTH ONCE DAILY. 30 tablet 1  . valsartan-hydrochlorothiazide (DIOVAN-HCT) 160-25 MG per tablet TAKE ONE TABLET DAILY. 30 tablet 11  . fluticasone (FLONASE) 50 MCG/ACT nasal spray Place 1 spray into both nostrils daily.    . nitroGLYCERIN (NITROSTAT) 0.4 MG SL tablet Place 1 tablet (0.4 mg total) under the tongue every 5 (five) minutes as needed. (Patient not taking: Reported on 01/19/2015) 30 tablet 3     Allergies-reviewed and updated Allergies  Allergen Reactions  . Penicillins     REACTION: rash    History   Social History  . Marital Status: Married    Spouse Name: N/A  . Number of Children: 3  . Years of Education: N/A   Occupational History  . apartment maintenance      retired   Social History Main Topics  . Smoking status: Former Smoker -- 1.00 packs/day for 3 years    Types: Cigarettes  . Smokeless tobacco: Former Systems developer    Types: Chew     Comment: quit 50 years ago  . Alcohol Use: No  . Drug Use: No  . Sexual Activity: Not on file   Other Topics Concern  . None   Social History Narrative   Married (wife patient elsewhere). 3 children- son is a patient here. 4 grandkids.       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 Oropharynx normal CV: RRR no murmurs rubs or gallops Lungs: CTAB no crackles, wheeze, rhonchi Abdomen: soft/nontender/nondistended/normal bowel sounds. No rebound or guarding.  Rectal: normal tone, diffusely mildly enlarged prostate, no masses or tenderness Ext: no edema Skin: warm, dry, 2+ PT pulses Neuro: grossly normal, moves all extremities, PERRLA   Assessment/Plan:  69 y.o. male presenting for annual physical.  Health Maintenance counseling: 1. Anticipatory guidance: Patient counseled regarding regular dental exams, wearing seatbelts, wearing sunscreen, regular eye exams- especially with diplopia issues. Has seen dermatology in the past.  2. Risk factor reduction:  Advised patient of need for regular exercise (not exercising currently- had been 3 days a week) and diet rich and fruits and vegetables to reduce risk of heart attack and stroke. He has cut down potatoes and bread. Does whole wheat if does eat it.  3. Immunizations/screenings/ancillary studies Health Maintenance Due  Topic Date Due  . ZOSTAVAX - declined 05/07/2007  . PNA vac Low Risk Adult (2  of 2 - PCV13)- Prevnar 13 today  04/26/2015  4. Colonoscopy-12/13/12 with 5 year repeat due to adenomatous polyp history 5. Prostate Cancer Screening-has some mild BPH but no nocturia to qualify for PSA screening. Would like yearly rectal exam. Likely d/c age 8.   CKD (chronic kidney disease), stage III S:GFR 55-65. Stable on repeat. Avoids nsaids. BP and lipids controlled and already on ace-i A/P: continue current management    Patient has extensive AK's/sun damage on face and also a lesion on left upper back, right anterior neck, and in his scalp that he would like dermatology to look at- has seen Dr. Jerrell Belfast before and referre dback.   6 month f/u or  sooner prn.   Orders Placed This Encounter  Procedures  . Pneumococcal conjugate vaccine 13-valent  . Urine Microscopic Only  . Ambulatory referral to Dermatology    Referral Priority:  Routine    Referral Type:  Consultation    Referral Reason:  Specialty Services Required    Requested Specialty:  Dermatology    Number of Visits Requested:  1   Results for orders placed or performed in visit on 06/08/15 (from the past 24 hour(s))  CBC     Status: Abnormal   Collection Time: 06/08/15  8:57 AM  Result Value Ref Range   WBC 6.4 4.0 - 10.5 K/uL   RBC 5.35 4.22 - 5.81 Mil/uL   Platelets 228.0 150.0 - 400.0 K/uL   Hemoglobin 15.5 13.0 - 17.0 g/dL   HCT 46.1 39.0 - 52.0 %   MCV 86.1 78.0 - 100.0 fl   MCHC 33.5 30.0 - 36.0 g/dL   RDW 16.4 (H) 11.5 - 15.5 %  Comprehensive metabolic panel     Status: Abnormal   Collection Time: 06/08/15  8:57 AM  Result Value Ref Range   Sodium 141 135 - 145 mEq/L   Potassium 3.8 3.5 - 5.1 mEq/L   Chloride 103 96 - 112 mEq/L   CO2 30 19 - 32 mEq/L   Glucose, Bld 105 (H) 70 - 99 mg/dL   BUN 14 6 - 23 mg/dL   Creatinine, Ser 1.34 0.40 - 1.50 mg/dL   Total Bilirubin 0.5 0.2 - 1.2 mg/dL   Alkaline Phosphatase 61 39 - 117 U/L   AST 20 0 - 37 U/L   ALT 25 0 - 53 U/L   Total Protein 7.4 6.0 - 8.3  g/dL   Albumin 4.0 3.5 - 5.2 g/dL   Calcium 9.6 8.4 - 10.5 mg/dL   GFR 56.33 (L) >60.00 mL/min  Lipid panel     Status: Abnormal   Collection Time: 06/08/15  8:57 AM  Result Value Ref Range   Cholesterol 101 0 - 200 mg/dL   Triglycerides 131.0 0.0 - 149.0 mg/dL   HDL 25.90 (L) >39.00 mg/dL   VLDL 26.2 0.0 - 40.0 mg/dL   LDL Cholesterol 49 0 - 99 mg/dL   Total CHOL/HDL Ratio 4    NonHDL 75.10   TSH     Status: None   Collection Time: 06/08/15  8:57 AM  Result Value Ref Range   TSH 1.31 0.35 - 4.50 uIU/mL  POCT urinalysis dipstick     Status: Abnormal   Collection Time: 06/08/15 11:27 AM  Result Value Ref Range   Color, UA yellow    Clarity, UA clear    Glucose, UA neg    Bilirubin, UA neg    Ketones, UA neg    Spec Grav, UA 1.020    Blood, UA 2+    pH, UA 6.0    Protein, UA neg    Urobilinogen, UA 0.2    Nitrite, UA neg    Leukocytes, UA Negative Negative

## 2015-06-23 DIAGNOSIS — D485 Neoplasm of uncertain behavior of skin: Secondary | ICD-10-CM | POA: Diagnosis not present

## 2015-06-23 DIAGNOSIS — D239 Other benign neoplasm of skin, unspecified: Secondary | ICD-10-CM | POA: Diagnosis not present

## 2015-06-23 DIAGNOSIS — L570 Actinic keratosis: Secondary | ICD-10-CM | POA: Diagnosis not present

## 2015-06-23 DIAGNOSIS — B078 Other viral warts: Secondary | ICD-10-CM | POA: Diagnosis not present

## 2015-06-25 ENCOUNTER — Other Ambulatory Visit: Payer: Self-pay | Admitting: Family Medicine

## 2015-07-08 ENCOUNTER — Other Ambulatory Visit: Payer: Self-pay | Admitting: Family Medicine

## 2015-07-22 DIAGNOSIS — G4733 Obstructive sleep apnea (adult) (pediatric): Secondary | ICD-10-CM | POA: Diagnosis not present

## 2015-07-24 ENCOUNTER — Encounter: Payer: Self-pay | Admitting: Gastroenterology

## 2015-08-04 ENCOUNTER — Other Ambulatory Visit: Payer: Self-pay | Admitting: Gastroenterology

## 2015-09-08 ENCOUNTER — Other Ambulatory Visit: Payer: Self-pay | Admitting: Family Medicine

## 2015-10-09 ENCOUNTER — Other Ambulatory Visit: Payer: Self-pay | Admitting: Gastroenterology

## 2015-10-15 ENCOUNTER — Other Ambulatory Visit: Payer: Self-pay

## 2015-10-15 MED ORDER — PANTOPRAZOLE SODIUM 40 MG PO TBEC: 40.0000 mg | DELAYED_RELEASE_TABLET | Freq: Every day | ORAL | Status: DC

## 2015-10-15 MED ORDER — MERCAPTOPURINE 50 MG PO TABS: ORAL_TABLET | ORAL | Status: DC

## 2015-10-16 ENCOUNTER — Ambulatory Visit: Payer: Commercial Managed Care - HMO | Admitting: Gastroenterology

## 2015-10-23 ENCOUNTER — Other Ambulatory Visit: Payer: Self-pay | Admitting: *Deleted

## 2015-10-23 MED ORDER — CLOPIDOGREL BISULFATE 75 MG PO TABS: 75.0000 mg | ORAL_TABLET | Freq: Every day | ORAL | Status: DC

## 2015-10-23 MED ORDER — ATORVASTATIN CALCIUM 10 MG PO TABS: 10.0000 mg | ORAL_TABLET | Freq: Every day | ORAL | Status: DC

## 2015-10-23 MED ORDER — VALSARTAN-HYDROCHLOROTHIAZIDE 160-25 MG PO TABS: 1.0000 | ORAL_TABLET | Freq: Every day | ORAL | Status: DC

## 2015-10-23 MED ORDER — ATENOLOL 25 MG PO TABS: 25.0000 mg | ORAL_TABLET | Freq: Every day | ORAL | Status: DC

## 2015-10-27 ENCOUNTER — Other Ambulatory Visit: Payer: Self-pay

## 2015-10-27 MED ORDER — VALSARTAN-HYDROCHLOROTHIAZIDE 160-25 MG PO TABS: 1.0000 | ORAL_TABLET | Freq: Every day | ORAL | Status: DC

## 2015-10-27 MED ORDER — ATORVASTATIN CALCIUM 10 MG PO TABS: 10.0000 mg | ORAL_TABLET | Freq: Every day | ORAL | Status: DC

## 2015-10-27 MED ORDER — CLOPIDOGREL BISULFATE 75 MG PO TABS: 75.0000 mg | ORAL_TABLET | Freq: Every day | ORAL | Status: DC

## 2015-10-27 MED ORDER — ATENOLOL 25 MG PO TABS: 25.0000 mg | ORAL_TABLET | Freq: Every day | ORAL | Status: DC

## 2015-11-24 ENCOUNTER — Encounter: Payer: Self-pay | Admitting: Physician Assistant

## 2015-11-24 ENCOUNTER — Other Ambulatory Visit (INDEPENDENT_AMBULATORY_CARE_PROVIDER_SITE_OTHER): Payer: Commercial Managed Care - HMO

## 2015-11-24 ENCOUNTER — Ambulatory Visit (INDEPENDENT_AMBULATORY_CARE_PROVIDER_SITE_OTHER): Payer: Commercial Managed Care - HMO | Admitting: Physician Assistant

## 2015-11-24 VITALS — BP 120/70 | HR 60 | Ht 67.0 in | Wt 220.0 lb

## 2015-11-24 DIAGNOSIS — K219 Gastro-esophageal reflux disease without esophagitis: Secondary | ICD-10-CM

## 2015-11-24 DIAGNOSIS — K5120 Ulcerative (chronic) proctitis without complications: Secondary | ICD-10-CM

## 2015-11-24 LAB — COMPREHENSIVE METABOLIC PANEL
ALBUMIN: 3.8 g/dL (ref 3.5–5.2)
ALK PHOS: 65 U/L (ref 39–117)
ALT: 27 U/L (ref 0–53)
AST: 19 U/L (ref 0–37)
BILIRUBIN TOTAL: 0.5 mg/dL (ref 0.2–1.2)
BUN: 12 mg/dL (ref 6–23)
CO2: 33 mEq/L — ABNORMAL HIGH (ref 19–32)
Calcium: 9.4 mg/dL (ref 8.4–10.5)
Chloride: 100 mEq/L (ref 96–112)
Creatinine, Ser: 1.24 mg/dL (ref 0.40–1.50)
GFR: 61.52 mL/min (ref >60.00)
Glucose, Bld: 97 mg/dL (ref 70–99)
POTASSIUM: 3.5 meq/L (ref 3.5–5.1)
SODIUM: 140 meq/L (ref 135–145)
TOTAL PROTEIN: 7 g/dL (ref 6.0–8.3)

## 2015-11-24 LAB — CBC WITH DIFFERENTIAL/PLATELET
BASOS ABS: 0 10*3/uL (ref 0.0–0.1)
Basophils Relative: 0.5 % (ref 0.0–3.0)
EOS PCT: 2.1 % (ref 0.0–5.0)
Eosinophils Absolute: 0.2 10*3/uL (ref 0.0–0.7)
HCT: 45.6 % (ref 39.0–52.0)
HEMOGLOBIN: 14.9 g/dL (ref 13.0–17.0)
LYMPHS ABS: 2.4 10*3/uL (ref 0.7–4.0)
Lymphocytes Relative: 30.6 % (ref 12.0–46.0)
MCHC: 32.8 g/dL (ref 30.0–36.0)
MCV: 86.8 fl (ref 78.0–100.0)
MONOS PCT: 10.5 % (ref 3.0–12.0)
Monocytes Absolute: 0.8 10*3/uL (ref 0.1–1.0)
Neutro Abs: 4.3 10*3/uL (ref 1.4–7.7)
Neutrophils Relative %: 56.3 % (ref 43.0–77.0)
Platelets: 250 10*3/uL (ref 150.0–400.0)
RBC: 5.25 Mil/uL (ref 4.22–5.81)
RDW: 16.2 % — ABNORMAL HIGH (ref 11.5–15.5)
WBC: 7.7 10*3/uL (ref 4.0–10.5)

## 2015-11-24 MED ORDER — PANTOPRAZOLE SODIUM 40 MG PO TBEC: 40.0000 mg | DELAYED_RELEASE_TABLET | Freq: Two times a day (BID) | ORAL | Status: DC

## 2015-11-24 MED ORDER — MERCAPTOPURINE 50 MG PO TABS: ORAL_TABLET | ORAL | Status: DC

## 2015-11-24 NOTE — Progress Notes (Signed)
Patient ID: [name], male   DOB: Oct 03, 1946, 70 y.o.   MRN: [id number]     History of Present Illness: [name] he has been maintained on mercaptopurine 50 mg 1-1/2 tablets daily. He also uses pantoprazole 40 mg 1 by mouth every morning for his GERD. He is having no breakthrough reflux symptoms. He is moving his bowels on a daily basis with no bright red blood per rectum, melena, mucus, or tenesmus. His last colonoscopy was on 12/13/2012 at which time 2 sessile polyps measuring 4-6 mm in the transverse and ascending colon were removed. He was advised to have a repeat colonoscopy in 5 years. He was also noted to have mild proctosigmoiditis. He last had an EGD on 06/22/2012 that showed an irregular Z line and a small hiatal hernia. Biopsies were negative for intestinal metaplasia.   Past Medical History  Diagnosis Date  . GERD (gastroesophageal reflux disease)   . Adenomatous polyps 03/2004, 11/2012  . Trigger finger (acquired)   . Coronary atherosclerosis of unspecified type of vessel, native or graft   . Hypertension   . Hyperlipidemia   . Ulcerative (chronic) proctosigmoiditis ([place]) 11/2006  . Esophagitis 1993  . Sleep apnea     cpap  . Arthritis   . Myocardial infarction ([place]) 1993  . Stroke [facility]) 02/2014    Past Surgical History  Procedure Laterality Date  . Cholecystectomy  2002  . Rotator cuff repair  2009    right  . Lumbar fusion  1983  . Angioplasty  1993  . Trigger finger release      right   Family History  Problem Relation Age of Onset  . Stroke Mother   . COPD Father   . Heart disease Sister     valve replacement  . Colon cancer Neg Hx   . CAD Brother   . Esophageal cancer Neg Hx   . Liver disease Neg Hx   . Diabetes Neg Hx    Social History  Substance Use Topics  . Smoking status: Former Smoker -- 1.00 packs/day for 3 years    Types: Cigarettes  . Smokeless tobacco: Former Systems developer    Types: Chew     Comment: quit 50 years ago  . Alcohol  Use: No   Current Outpatient Prescriptions  Medication Sig Dispense Refill  . atenolol (TENORMIN) 25 MG tablet Take 1 tablet (25 mg total) by mouth daily. 90 tablet 3  . atorvastatin (LIPITOR) 10 MG tablet Take 1 tablet (10 mg total) by mouth daily. 90 tablet 3  . clopidogrel (PLAVIX) 75 MG tablet Take 1 tablet (75 mg total) by mouth daily. 90 tablet 3  . mercaptopurine (PURINETHOL) 50 MG tablet TAKE 1 AND 1/2 TABLETS DAILY.GIVE ON AN EMPTY STOMACH 1 HOUR BEFORE OR 2 HOURS AFTER MEAL. 45 tablet 5  . nitroGLYCERIN (NITROSTAT) 0.4 MG SL tablet Place 1 tablet (0.4 mg total) under the tongue every 5 (five) minutes as needed. 30 tablet 3  . pantoprazole (PROTONIX) 40 MG tablet Take 1 tablet (40 mg total) by mouth 2 (two) times daily. 180 tablet 3  . valsartan-hydrochlorothiazide (DIOVAN-HCT) 160-25 MG tablet Take 1 tablet by mouth daily. 90 tablet 3   No current facility-administered medications for this visit.   Allergies  Allergen Reactions  . Penicillins     REACTION: rash     Review of Systems: Gen: Denies any fever, chills, sweats, anorexia, fatigue, weakness, malaise, weight loss, and sleep disorder CV: Denies chest pain, angina,  palpitations, syncope, orthopnea, PND, peripheral edema, and claudication. Resp: Denies dyspnea at rest, dyspnea with exercise, cough, sputum, wheezing, coughing up blood, and pleurisy. GI: Denies vomiting blood, jaundice, and fecal incontinence.   Denies dysphagia or odynophagia. GU : Denies urinary burning, blood in urine, urinary frequency, urinary hesitancy, nocturnal urination, and urinary incontinence. MS: Denies joint pain, limitation of movement, and swelling, stiffness, low back pain, extremity pain. Denies muscle weakness, cramps, atrophy.  Derm: Denies rash, itching, dry skin, hives, moles, warts, or unhealing ulcers.  Psych: Denies depression, anxiety, memory loss, suicidal ideation, hallucinations, paranoia, and confusion. Heme: Denies bruising,  bleeding, and enlarged lymph nodes. Neuro:  Denies any headaches, dizziness, paresthesia Endo:  Denies any problems with DM, thyroid, adrenal    Physical Exam: BP 120/70 mmHg  Pulse 60  Ht 5' 7"  (1.702 m)  Wt 220 lb (99.791 kg)  BMI 34.45 kg/m2 General: Pleasant, well developed , male in no acute distress Head: Normocephalic and atraumatic Eyes:  sclerae anicteric, conjunctiva pink  Ears: Normal auditory acuity Lungs: Clear throughout to auscultation Heart: Regular rate and rhythm Abdomen: Soft, non distended, non-tender. No masses, no hepatomegaly. Normal bowel sounds Musculoskeletal: Symmetrical with no gross deformities  Extremities: No edema  Neurological: Alert oriented x 4, grossly nonfocal Psychological:  Alert and cooperative. Normal mood and affect  Assessment and Recommendations: #1. Ulcerative proctosigmoiditis, currently under good control on current therapy. Will obtain a CBC and comprehensive metabolic panel today.  #2. GERD. Continue pantoprazole 40 mg daily and antireflux regimen.  He will follow up in 6 months, sooner if needed.        [name], [name] Barley PA-C 11/24/2015,

## 2015-11-24 NOTE — Progress Notes (Signed)
Reviewed and agree with management plan.  Romi Rathel T. Daysy Santini, MD FACG 

## 2015-11-24 NOTE — Patient Instructions (Addendum)
Please go to the basement for labs today We have sent Protoniix to your mailorder for a 90 day supply We have sent 6MP to [facility] Follow up in 6 months

## 2015-12-07 ENCOUNTER — Ambulatory Visit (INDEPENDENT_AMBULATORY_CARE_PROVIDER_SITE_OTHER): Payer: Commercial Managed Care - HMO | Admitting: Family Medicine

## 2015-12-07 ENCOUNTER — Encounter: Payer: Self-pay | Admitting: Family Medicine

## 2015-12-07 VITALS — BP 120/82 | HR 56 | Temp 98.1°F | Wt 223.0 lb

## 2015-12-07 DIAGNOSIS — I10 Essential (primary) hypertension: Secondary | ICD-10-CM

## 2015-12-07 DIAGNOSIS — I2510 Atherosclerotic heart disease of native coronary artery without angina pectoris: Secondary | ICD-10-CM

## 2015-12-07 DIAGNOSIS — M179 Osteoarthritis of knee, unspecified: Secondary | ICD-10-CM | POA: Insufficient documentation

## 2015-12-07 DIAGNOSIS — M1710 Unilateral primary osteoarthritis, unspecified knee: Secondary | ICD-10-CM | POA: Insufficient documentation

## 2015-12-07 DIAGNOSIS — M25561 Pain in right knee: Secondary | ICD-10-CM | POA: Diagnosis not present

## 2015-12-07 DIAGNOSIS — N183 Chronic kidney disease, stage 3 unspecified: Secondary | ICD-10-CM

## 2015-12-07 MED ORDER — NITROGLYCERIN 0.4 MG SL SUBL: 0.4000 mg | SUBLINGUAL_TABLET | SUBLINGUAL | Status: DC | PRN

## 2015-12-07 NOTE — Progress Notes (Signed)
Garret Reddish, MD  Subjective:  [name] is a 70 y.o. year old very pleasant male patient who presents for/with See problem oriented charting ROS- No chest pain or shortness of breath. No headache or blurry vision.   Past Medical History-  Patient Active Problem List   Diagnosis Date Noted  . ULCERATIVE COLITIS-LEFT SIDE 07/03/2008    Priority: High  . CAD (coronary artery disease) 06/15/2007    Priority: High  . Hyperglycemia 06/08/2015    Priority: Medium  . CKD (chronic kidney disease), stage III 10/24/2014    Priority: Medium  . Sleep apnea 07/02/2008    Priority: Medium  . Hyperlipidemia 06/14/2007    Priority: Medium  . Essential hypertension 06/14/2007    Priority: Medium  . Former smoker 06/08/2015    Priority: Low  . Allergic rhinitis 10/24/2014    Priority: Low  . Obesity (BMI 30-39.9) 04/25/2014    Priority: Low  . Right cervical radiculopathy 04/12/2013    Priority: Low  . Low back pain 01/11/2008    Priority: Low  . GERD 11/07/2007    Priority: Low  . Trigger finger, acquired 10/23/2007    Priority: Low  . History of colonic polyps 06/14/2007    Priority: Low  . Right knee pain 12/07/2015  . Edema 02/04/2015    Medications- reviewed and updated Current Outpatient Prescriptions  Medication Sig Dispense Refill  . atenolol (TENORMIN) 25 MG tablet Take 1 tablet (25 mg total) by mouth daily. 90 tablet 3  . atorvastatin (LIPITOR) 10 MG tablet Take 1 tablet (10 mg total) by mouth daily. 90 tablet 3  . clopidogrel (PLAVIX) 75 MG tablet Take 1 tablet (75 mg total) by mouth daily. 90 tablet 3  . mercaptopurine (PURINETHOL) 50 MG tablet TAKE 1 AND 1/2 TABLETS DAILY.GIVE ON AN EMPTY STOMACH 1 HOUR BEFORE OR 2 HOURS AFTER MEAL. 45 tablet 5  . pantoprazole (PROTONIX) 40 MG tablet Take 1 tablet (40 mg total) by mouth 2 (two) times daily. 180 tablet 3  . valsartan-hydrochlorothiazide (DIOVAN-HCT) 160-25 MG tablet Take 1 tablet by mouth daily. 90 tablet 3  .  nitroGLYCERIN (NITROSTAT) 0.4 MG SL tablet Place 1 tablet (0.4 mg total) under the tongue every 5 (five) minutes as needed. 30 tablet 3   No current facility-administered medications for this visit.    Objective: BP 120/82 mmHg  Pulse 56  Temp(Src) 98.1 F (36.7 C)  Wt 223 lb (101.152 kg) Gen: NAD, resting comfortably CV: RRR no murmurs rubs or gallops Lungs: CTAB no crackles, wheeze, rhonchi Abdomen: soft/nontender/nondistended/normal bowel sounds. No rebound or guarding.  Ext: 1+ edema Skin: warm, dry Neuro: grossly normal, moves all extremities  Right Knee: Normal to inspection with no erythema or effusion or obvious bony abnormalities. Palpation normal with no warmth, patellar tenderness, or condyle tenderness. Noted medial joint line tenderness ROM full in flexion and extension and lower leg rotation. Ligaments with solid consistent endpoints including ACL, PCL, LCL, MCL. Mcmurray's produces pain as well as intermittent click but they do not always align Non painful patellar compression. Hamstring and quadriceps strength is normal.     Assessment/Plan:  Right knee pain S: R knee pain with walking always hurting on medial side. For 2 weeks. Seemed to start after being active working on his truck. Moderate aching pain. Never had pain like this before.  A/P: will get x-ray standing bilateral knees to evaluate for arthrtis. Patient does have some increased pain with mcmurrays- suspect degenerative meniscal tear potentially. Avoid  nsaids with CKD and CAD. May use tylenol and ice. Avoid steroids unless persists for another 6-8 weeks.    CAD (coronary artery disease) S: Hx MI 1993-angioplasty. Compliant with Plavix (CVA affecting vision in April 2015), atorvastatin. Has not seen cardiology in years A/P: echo done 04/2015 for edema but no recent stress test- we will refer back at this time. Fortunately asymptomatic.   CKD (chronic kidney disease), stage III S: GFR stable  recently (actually just above 60 at 61) A/P: continue to avoid nsaids. Continue excellent BP control and lipid control. On ace-i in case proteinuric element   Essential hypertension S: controlled. On Valsartan hctz 160-25 mg daily  BP Readings from Last 3 Encounters:  12/07/15 120/82  11/24/15 120/70  06/08/15 118/74  A/P:Continue current meds:  Very well controlled    Return in about 6 months (around 06/05/2016) for or sooner if needed for knee pain. Sooner Return precautions advised.   Orders Placed This Encounter  Procedures  . DG Knee Bilateral Standing AP    Standing Status: Future     Number of Occurrences:      Standing Expiration Date: 02/03/2017    Order Specific Question:  Reason for Exam (SYMPTOM  OR DIAGNOSIS REQUIRED)    Answer:  right knee pain- suspect underlying OA, possible degenerative meniscal tear    Order Specific Question:  Preferred imaging location?    Answer:  [facility]  . Ambulatory referral to Cardiology    Referral Priority:  Routine    Referral Type:  Consultation    Referral Reason:  Specialty Services Required    Requested Specialty:  Cardiology    Number of Visits Requested:  1    Meds ordered this encounter  Medications  . nitroGLYCERIN (NITROSTAT) 0.4 MG SL tablet    Sig: Place 1 tablet (0.4 mg total) under the tongue every 5 (five) minutes as needed.    Dispense:  30 tablet    Refill:  3

## 2015-12-07 NOTE — Assessment & Plan Note (Signed)
S: GFR stable recently (actually just above 60 at 61) A/P: continue to avoid nsaids. Continue excellent BP control and lipid control. On ace-i in case proteinuric element

## 2015-12-07 NOTE — Assessment & Plan Note (Signed)
S: controlled. On Valsartan hctz 160-25 mg daily  BP Readings from Last 3 Encounters:  12/07/15 120/82  11/24/15 120/70  06/08/15 118/74  A/P:Continue current meds:  Very well controlled

## 2015-12-07 NOTE — Assessment & Plan Note (Signed)
S: Hx MI 1993-angioplasty. Compliant with Plavix (CVA affecting vision in April 2015), atorvastatin. Has not seen cardiology in years A/P: echo done 04/2015 for edema but no recent stress test- we will refer back at this time. Fortunately asymptomatic.

## 2015-12-07 NOTE — Patient Instructions (Addendum)
Sent in new nitroglycerin  We will call you within a week about your referral to cardiology. If you do not hear within 2 weeks, give [place] a call.   Go get knee x-rays- suspect arthritis. Avoid ibuprofen due to kidney and heart. May take tylenol. Hold off on steroid injection unless not better in 6-8 weeks (this would help arthritis as well as degenerative meniscal tear)

## 2015-12-07 NOTE — Assessment & Plan Note (Signed)
S: R knee pain with walking always hurting on medial side. For 2 weeks. Seemed to start after being active working on his truck. Moderate aching pain. Never had pain like this before.  A/P: will get x-ray standing bilateral knees to evaluate for arthrtis. Patient does have some increased pain with mcmurrays- suspect degenerative meniscal tear potentially. Avoid nsaids with CKD and CAD. May use tylenol and ice. Avoid steroids unless persists for another 6-8 weeks.

## 2015-12-08 ENCOUNTER — Ambulatory Visit (INDEPENDENT_AMBULATORY_CARE_PROVIDER_SITE_OTHER)
Admission: RE | Admit: 2015-12-08 | Discharge: 2015-12-08 | Disposition: A | Discharge status: Home / Self Care | Payer: Commercial Managed Care - HMO | Source: Ambulatory Visit | Attending: Family Medicine | Admitting: Family Medicine

## 2015-12-08 DIAGNOSIS — M170 Bilateral primary osteoarthritis of knee: Secondary | ICD-10-CM | POA: Diagnosis not present

## 2015-12-08 DIAGNOSIS — M25561 Pain in right knee: Secondary | ICD-10-CM | POA: Diagnosis not present

## 2015-12-10 ENCOUNTER — Telehealth: Payer: Self-pay | Admitting: Family Medicine

## 2015-12-10 NOTE — Telephone Encounter (Signed)
Pt.notified

## 2015-12-10 NOTE — Telephone Encounter (Signed)
Pt call asking why CKD STAGE III is listed on his discharge summary. Pt said he has never been told anything about having this. Pt is requesting a call back to discuss farther.    [phone]    Cell [phone]

## 2016-01-04 ENCOUNTER — Encounter: Payer: Self-pay | Admitting: Internal Medicine

## 2016-01-04 ENCOUNTER — Ambulatory Visit (INDEPENDENT_AMBULATORY_CARE_PROVIDER_SITE_OTHER): Payer: Commercial Managed Care - HMO | Admitting: Internal Medicine

## 2016-01-04 VITALS — BP 122/70 | HR 64 | Ht 67.0 in | Wt 221.0 lb

## 2016-01-04 DIAGNOSIS — I10 Essential (primary) hypertension: Secondary | ICD-10-CM | POA: Diagnosis not present

## 2016-01-04 MED ORDER — FUROSEMIDE 20 MG PO TABS: 20.0000 mg | ORAL_TABLET | Freq: Every day | ORAL | Status: DC | PRN

## 2016-01-04 NOTE — Patient Instructions (Signed)
Your physician has recommended you make the following change in your medication:  1.) lasix 20 mg--take 1 tablet by mouth daily as needed for swelling  Your physician wants you to follow-up in: 1 year with Dr. Harrington Challenger.  You will receive a reminder letter in the mail two months in advance. If you don't receive a letter, please call our office to schedule the follow-up appointment.

## 2016-01-04 NOTE — Progress Notes (Addendum)
Cardiology Office Note   Date:  01/04/2016   ID:  [name], Lowery December 19, 1945, MRN [id number]  PCP:  Garret Reddish, MD  Cardiologist:   Dorris Carnes, MD   Pt referred for continued care of CAD      History of Present Illness: [name] is a 70 y.o. male with a history of CAd  MI in [place] [facility]  To LAD  Last nuclear stress test in 2011  Normal  LVEF 69%  Was followed by Dr Aundra Dubin at that time   Also a history of CKD, DJD, HTN, ulcerative colitis   Echo done June 2016 LVEF was 60 to 65%  Patient says the last few weeks he has been feeling tired and fatigued  Not sick  Doesn't feel like doing anything  Has had problems with eyes  Double vision.  Last episode Feb 2016  Pt says he has some ?acid reflux.   Uses CPAP  Sometimes when gets up has discomfrot   Pt is fatigued sitting  Not with activity  Does yard work  Rakes leaves in fall Climbs up/down ladders No problem   Mows yard  With MI in 1993 had back pain and sweating  Washing car at time Remembers well     Current Outpatient Prescriptions  Medication Sig Dispense Refill  . atenolol (TENORMIN) 25 MG tablet Take 1 tablet (25 mg total) by mouth daily. 90 tablet 3  . atorvastatin (LIPITOR) 10 MG tablet Take 1 tablet (10 mg total) by mouth daily. 90 tablet 3  . clopidogrel (PLAVIX) 75 MG tablet Take 1 tablet (75 mg total) by mouth daily. 90 tablet 3  . mercaptopurine (PURINETHOL) 50 MG tablet TAKE 1 AND 1/2 TABLETS DAILY.GIVE ON AN EMPTY STOMACH 1 HOUR BEFORE OR 2 HOURS AFTER MEAL. 45 tablet 5  . nitroGLYCERIN (NITROSTAT) 0.4 MG SL tablet Place 0.4 mg under the tongue every 5 (five) minutes as needed for chest pain.    . pantoprazole (PROTONIX) 40 MG tablet Take 1 tablet (40 mg total) by mouth 2 (two) times daily. 180 tablet 3  . valsartan-hydrochlorothiazide (DIOVAN-HCT) 160-25 MG tablet Take 1 tablet by mouth daily. 90 tablet 3   No current facility-administered medications for this visit.     Allergies:   Penicillins   Past Medical History  Diagnosis Date  . GERD (gastroesophageal reflux disease)   . Adenomatous polyps 03/2004, 11/2012  . Trigger finger (acquired)   . Coronary atherosclerosis of unspecified type of vessel, native or graft   . Hypertension   . Hyperlipidemia   . 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 Esophagitis 1993  . Sleep apnea     cpap  . Arthritis   . Myocardial infarction ([place]) 1993  . Stroke [facility]) 02/2014    Past Surgical History  Procedure Laterality Date  . Cholecystectomy  2002  . Rotator cuff repair  2009    right  . Lumbar fusion  1983  . Angioplasty  1993  . Trigger finger release      right     Social History:  The patient  reports that he has quit smoking. His smoking use included Cigarettes. He has a 3 pack-year smoking history. He has quit using smokeless tobacco. His smokeless tobacco use included Chew. He reports that he does not drink alcohol or use illicit drugs.   Family History:  The patient's family history includes CAD in his brother; COPD in his father; Heart attack in his  brother; Heart disease in his sister; Stroke in his mother. There is no history of Colon cancer, Esophageal cancer, Liver disease, Diabetes, or Hypertension.    ROS:  Please see the history of present illness. All other systems are reviewed and  Negative to the above problem except as noted.    PHYSICAL EXAM: VS:  BP 122/70 mmHg  Pulse 64  Ht 5' 7"  (1.702 m)  Wt 100.245 kg (221 lb)  BMI 34.61 kg/m2  GEN: Obese 70 yo , in no acute distress HEENT: normal Neck: no JVD, carotid bruits, or masses Cardiac: RRR; no murmurs, rubs, or gallops,no edema  Respiratory:  clear to auscultation bilaterally, normal work of breathing GI: soft, nontender, nondistended, + BS  No hepatomegaly  MS: no deformity Moving all extremities   Skin: warm and dry, no rash Neuro:  Strength and sensation are intact Psych: euthymic mood, full  affect   EKG:  EKG is ordered today.  SB 59 bpm  First degree AV block  PR 206 msec     Lipid Panel    Component Value Date/Time   CHOL 101 06/08/2015 0857   TRIG 131.0 06/08/2015 0857   TRIG 88 10/20/2006 0927   HDL 25.90* 06/08/2015 0857   CHOLHDL 4 06/08/2015 0857   CHOLHDL 3.1 CALC 10/20/2006 0927   VLDL 26.2 06/08/2015 0857   LDLCALC 49 06/08/2015 0857      Wt Readings from Last 3 Encounters:  01/04/16 100.245 kg (221 lb)  12/07/15 101.152 kg (223 lb)  11/24/15 99.791 kg (220 lb)      ASSESSMENT AND PLAN: 1  CAD Remote intervention.  I am not convinced of any active symptoms.  I encouraged him to increase his activity  Follow  If back discomfort, SOB call.  Pt does complain of intermittent edema in hands and feet   None today  Echo shows normal LVEF  I will rx lasix 20 mg to take as needed  Call if taking more than intermitt or if go up on dose   Watch salt  2  HL LDL is well controlled  HDL low  Increase acitvity  Get wt down   3  HTN  BP is well controlled   F/U in 1 year      Current medicines are reviewed at length with the patient today.  The patient does not have concerns regarding medicines.  Signed, Dorris Carnes, MD  01/04/2016 3:39 PM    [place], [place],   [address] Phone: [phone]; Fax: [phone]

## 2016-02-04 DIAGNOSIS — G4733 Obstructive sleep apnea (adult) (pediatric): Secondary | ICD-10-CM | POA: Diagnosis not present

## 2016-02-09 ENCOUNTER — Telehealth: Payer: Self-pay | Admitting: Family Medicine

## 2016-02-09 ENCOUNTER — Other Ambulatory Visit: Payer: Self-pay | Admitting: Family Medicine

## 2016-02-09 DIAGNOSIS — M25561 Pain in right knee: Secondary | ICD-10-CM

## 2016-02-09 NOTE — Telephone Encounter (Signed)
Referral placed.

## 2016-02-09 NOTE — Telephone Encounter (Signed)
It has been less than 3 months since last shot- since my shot didn't last 3 months- lets refer to orthopedics.

## 2016-02-09 NOTE — Telephone Encounter (Signed)
Pt is still having right knee pain and wants to know if Dr Yong Channel will do a steroid injection in that knee.

## 2016-02-09 NOTE — Telephone Encounter (Signed)
Ok for injection or does pt need to see ortho?

## 2016-02-12 DIAGNOSIS — M25561 Pain in right knee: Secondary | ICD-10-CM | POA: Diagnosis not present

## 2016-05-13 ENCOUNTER — Encounter: Payer: Self-pay | Admitting: Family Medicine

## 2016-05-13 ENCOUNTER — Telehealth: Payer: Self-pay | Admitting: Family Medicine

## 2016-05-13 ENCOUNTER — Ambulatory Visit (INDEPENDENT_AMBULATORY_CARE_PROVIDER_SITE_OTHER): Payer: Commercial Managed Care - HMO | Admitting: Family Medicine

## 2016-05-13 DIAGNOSIS — J0110 Acute frontal sinusitis, unspecified: Secondary | ICD-10-CM | POA: Diagnosis not present

## 2016-05-13 DIAGNOSIS — H532 Diplopia: Secondary | ICD-10-CM

## 2016-05-13 MED ORDER — METHYLPREDNISOLONE ACETATE 80 MG/ML IJ SUSP
80.0000 mg | Freq: Once | INTRAMUSCULAR | Status: AC
  Administered 2016-05-13: 80 mg via INTRAMUSCULAR

## 2016-05-13 NOTE — Telephone Encounter (Signed)
Pt seen by Dr. Yong Channel today

## 2016-05-13 NOTE — Telephone Encounter (Signed)
Patient Name: [name] DOB: [birth year] Initial Comment pressure around nose, under eye, headaches, thinks it is a sinus infection. Nurse Assessment Nurse: Ronnald Ramp, RN, Miranda Date/Time (Eastern Time): 05/13/2016 9:12:25 AM Confirm and document reason for call. If symptomatic, describe symptoms. You must click the next button to save text entered. ---Caller states he has pressure around his nose, under his eyes, and headache. Also with nasal congestion. Symptoms started 3 days ago. Has the patient traveled out of the country within the last 30 days? ---No Does the patient have any new or worsening symptoms? ---Yes Will a triage be completed? ---Yes Related visit to physician within the last 2 weeks? ---No Does the PT have any chronic conditions? (i.e. diabetes, asthma, etc.) ---Yes List chronic conditions. ---HTN, High Cholesterol, GERD, Colitis Is this a behavioral health or substance abuse call? ---No Guidelines Guideline Title Affirmed Question Affirmed Notes Sinus Pain or Congestion [1] Using nasal washes and pain medicine > 24 hours AND [2] sinus pain (around cheekbone or eye) persists Final Disposition User See PCP When Office is Open (within 3 days) Ronnald Ramp, RN, Miranda Comments Appt scheduled for today with Dr. Yong Channel at 1pm. Referrals REFERRED TO PCP OFFICE Disagree/Comply: Comply

## 2016-05-13 NOTE — Telephone Encounter (Signed)
FYI

## 2016-05-13 NOTE — Progress Notes (Signed)
Pre visit review using our clinic review tool, if applicable. No additional management support is needed unless otherwise documented below in the visit note. 

## 2016-05-13 NOTE — Progress Notes (Signed)
PCP: Garret Reddish, MD  Subjective:  [name] is a 70 y.o. year old very pleasant male patient who presents with sinusitis symptoms including nasal congestion with clear discharge, sinus tenderness primarily in frontal sinuses -other symptoms include: mild headache. No cough -day of illness:5 -started: Monday -Symptoms are worsening through today - worse with mowing -previous treatments: tylenol, nasal saline. Tylenol helps with pain -sick contacts/travel/risks: denies flu exposure.  -Hx of: allergies- not on any meds at present  ROS-denies fever, SOB, NVD, tooth pain  Pertinent Past Medical History-  Patient Active Problem List   Diagnosis Date Noted  . ULCERATIVE COLITIS-LEFT SIDE 07/03/2008    Priority: High  . CAD (coronary artery disease) 06/15/2007    Priority: High  . Hyperglycemia 06/08/2015    Priority: Medium  . CKD (chronic kidney disease), stage III 10/24/2014    Priority: Medium  . Sleep apnea 07/02/2008    Priority: Medium  . Hyperlipidemia 06/14/2007    Priority: Medium  . Essential hypertension 06/14/2007    Priority: Medium  . Former smoker 06/08/2015    Priority: Low  . Allergic rhinitis 10/24/2014    Priority: Low  . Obesity (BMI 30-39.9) 04/25/2014    Priority: Low  . Right cervical radiculopathy 04/12/2013    Priority: Low  . Low back pain 01/11/2008    Priority: Low  . GERD 11/07/2007    Priority: Low  . Trigger finger, acquired 10/23/2007    Priority: Low  . History of colonic polyps 06/14/2007    Priority: Low  . Right knee pain 12/07/2015  . Edema 02/04/2015    Medications- reviewed  Current Outpatient Prescriptions  Medication Sig Dispense Refill  . atenolol (TENORMIN) 25 MG tablet Take 1 tablet (25 mg total) by mouth daily. 90 tablet 3  . atorvastatin (LIPITOR) 10 MG tablet Take 1 tablet (10 mg total) by mouth daily. 90 tablet 3  . clopidogrel (PLAVIX) 75 MG tablet Take 1 tablet (75 mg total) by mouth daily. 90 tablet 3   . furosemide (LASIX) 20 MG tablet Take 1 tablet (20 mg total) by mouth daily as needed for edema. 30 tablet 6  . mercaptopurine (PURINETHOL) 50 MG tablet TAKE 1 AND 1/2 TABLETS DAILY.GIVE ON AN EMPTY STOMACH 1 HOUR BEFORE OR 2 HOURS AFTER MEAL. 45 tablet 5  . nitroGLYCERIN (NITROSTAT) 0.4 MG SL tablet Place 0.4 mg under the tongue every 5 (five) minutes as needed for chest pain.    . pantoprazole (PROTONIX) 40 MG tablet Take 1 tablet (40 mg total) by mouth 2 (two) times daily. 180 tablet 3  . valsartan-hydrochlorothiazide (DIOVAN-HCT) 160-25 MG tablet Take 1 tablet by mouth daily. 90 tablet 3   No current facility-administered medications for this visit.    Objective: BP 116/68 mmHg  Pulse 56  Temp(Src) 98.1 F (36.7 C) (Oral)  Ht 5' 7"  (1.702 m)  Wt 218 lb (98.884 kg)  BMI 34.14 kg/m2  SpO2 97% Gen: NAD, resting comfortably HEENT: Turbinates erythematous with yellow drainage, TM normal, pharynx mildly erythematous with no tonsilar exudate or edema, frontal sinus tenderness CV: RRR no murmurs rubs or gallops Lungs: CTAB no crackles, wheeze, rhonchi Abdomen: soft/nontender/nondistended/normal bowel sounds. No rebound or guarding.  Ext: trace edema Skin: warm, dry, no rash Neuro: grossly normal, moves all extremities  Assessment/Plan:  Sinsusitis Viral based on <10 days, no double sickening, lack of severity of symptoms in first 3 days. We also discussed reasons why current illness does not meet criteria for bacterial illness  and therefore no antibiotics indicated. Also educated on signs that bacterial infection may have developed.   Treatment: -considered steroid: we opted in- patient needs to watch his food intake while on medicine and make efforts not to gain weight as he is at increased risk for diabetes -other symptomatic care with mucinex -Antibiotic indicated: no  Finally, we reviewed reasons to return to care including if symptoms worsen or persist or new concerns arise.  Specifically i agreed to call in antibiotic if he gets a fever, symptoms improve then worsen or are not at least 50% better through next Wednesday  Patient also with history double vision and needed referral to optho which was placed  New acute condition, established condition x1. Medication management. Higher risk patient with history ulcerative colitis, CAD. Warned of hyperglycemia risks.   Depo-medrol 15m given by JLucianne Lei LPN  SGarret Reddish MD

## 2016-05-13 NOTE — Patient Instructions (Signed)
Sinsusitis Viral based on <10 days, no double sickening, lack of severity of symptoms in first 3 days. We also discussed reasons why current illness does not meet criteria for bacterial illness and therefore no antibiotics indicated. Also educated on signs that bacterial infection may have developed.   Treatment: -considered steroid: we opted in- patient needs to watch his food intake while on medicine and make efforts not to gain weight as he is at increased risk for diabetes -other symptomatic care with mucinex -Antibiotic indicated: no  Finally, we reviewed reasons to return to care including if symptoms worsen or persist or new concerns arise. Specifically i agreed to call in antibiotic if he gets a fever, symptoms improve then worsen or are not at least 50% better through next wednesday

## 2016-05-18 ENCOUNTER — Telehealth: Payer: Self-pay | Admitting: Family Medicine

## 2016-05-18 MED ORDER — AZITHROMYCIN 250 MG PO TABS: ORAL_TABLET | ORAL | Status: DC

## 2016-05-18 NOTE — Telephone Encounter (Signed)
I sent in in azithromycin given PCN allergy (would usually use augmentin). Patient should return to see [place] if not better within a week for reevaluation

## 2016-05-18 NOTE — Telephone Encounter (Signed)
[facility]  Patient Name: [name]  DOB: Dec 23, 1945    Initial Comment congestion, sinus pressure saw dr. Wendi Snipes, was told if not better today he would call in rx.    Nurse Assessment  Nurse: Wayne Sever, RN, Tillie Rung Date/Time (Eastern Time): 05/18/2016 10:52:52 AM  Confirm and document reason for call. If symptomatic, describe symptoms. You must click the next button to save text entered. ---Caller states he was seen on Friday and was told that if he was not better by today and to call back and the doctor would call in some medication for him. He denies any fever. Caller states he has sinus infection, he states he is having congestion and lots of pressure. He was given a Prednisone shot in the office that day.  Has the patient traveled out of the country within the last 30 days? ---Not Applicable  Does the patient have any new or worsening symptoms? ---Yes  Will a triage be completed? ---Yes  Related visit to physician within the last 2 weeks? ---No  Does the PT have any chronic conditions? (i.e. diabetes, asthma, etc.) ---Yes  List chronic conditions. ---Heart Issues, HTN, GERD  Is this a behavioral health or substance abuse call? ---No     Guidelines    Guideline Title Affirmed Question Affirmed Notes  Sinus Pain or Congestion [1] Sinus congestion (pressure, fullness) AND [2] present > 10 days    Final Disposition User   See PCP When Office is Open (within 3 days) Wayne Sever, RN, Tillie Rung    Comments  Caller refused appointment. He states the doctor told him he would only have to call for antibiotics today and would not need another evaluation. Told him I would send to office and someone would contact him. Verbalized understanding received   Referrals  REFERRED TO PCP OFFICE   Disagree/Comply: Disagree  Disagree/Comply Reason: Disagree with instructions

## 2016-05-18 NOTE — Telephone Encounter (Signed)
Spoke with patient and informed him that antibiotic would be at the pharmacy. Verbalized understanding and will schedule an appointment to come back in if not better in a week.

## 2016-05-18 NOTE — Telephone Encounter (Signed)
Antibiotic requested by patient, as indicated in your office note.

## 2016-06-02 DIAGNOSIS — H538 Other visual disturbances: Secondary | ICD-10-CM | POA: Diagnosis not present

## 2016-06-02 DIAGNOSIS — H43812 Vitreous degeneration, left eye: Secondary | ICD-10-CM | POA: Diagnosis not present

## 2016-06-02 DIAGNOSIS — H2511 Age-related nuclear cataract, right eye: Secondary | ICD-10-CM | POA: Diagnosis not present

## 2016-06-06 ENCOUNTER — Ambulatory Visit: Payer: Commercial Managed Care - HMO | Admitting: Family Medicine

## 2016-06-06 ENCOUNTER — Encounter: Payer: Self-pay | Admitting: Family Medicine

## 2016-06-06 ENCOUNTER — Ambulatory Visit (INDEPENDENT_AMBULATORY_CARE_PROVIDER_SITE_OTHER): Payer: Commercial Managed Care - HMO | Admitting: Family Medicine

## 2016-06-06 VITALS — BP 124/76 | HR 57 | Temp 97.9°F | Wt 214.6 lb

## 2016-06-06 DIAGNOSIS — I10 Essential (primary) hypertension: Secondary | ICD-10-CM | POA: Diagnosis not present

## 2016-06-06 DIAGNOSIS — L989 Disorder of the skin and subcutaneous tissue, unspecified: Secondary | ICD-10-CM

## 2016-06-06 DIAGNOSIS — M170 Bilateral primary osteoarthritis of knee: Secondary | ICD-10-CM

## 2016-06-06 DIAGNOSIS — I2510 Atherosclerotic heart disease of native coronary artery without angina pectoris: Secondary | ICD-10-CM | POA: Diagnosis not present

## 2016-06-06 DIAGNOSIS — E785 Hyperlipidemia, unspecified: Secondary | ICD-10-CM

## 2016-06-06 NOTE — Progress Notes (Signed)
Subjective:  [name] is a 70 y.o. year old very pleasant male patient who presents for/with See problem oriented charting ROS- No chest pain or shortness of breath. No headache or blurry vision. .see any ROS included in HPI as well.   Past Medical History-  Patient Active Problem List   Diagnosis Date Noted  . ULCERATIVE COLITIS-LEFT SIDE 07/03/2008    Priority: High  . CAD (coronary artery disease) 06/15/2007    Priority: High  . Hyperglycemia 06/08/2015    Priority: Medium  . CKD (chronic kidney disease), stage III 10/24/2014    Priority: Medium  . Sleep apnea 07/02/2008    Priority: Medium  . Hyperlipidemia 06/14/2007    Priority: Medium  . Essential hypertension 06/14/2007    Priority: Medium  . Former smoker 06/08/2015    Priority: Low  . Allergic rhinitis 10/24/2014    Priority: Low  . Obesity (BMI 30-39.9) 04/25/2014    Priority: Low  . Right cervical radiculopathy 04/12/2013    Priority: Low  . Low back pain 01/11/2008    Priority: Low  . GERD 11/07/2007    Priority: Low  . Trigger finger, acquired 10/23/2007    Priority: Low  . History of colonic polyps 06/14/2007    Priority: Low  . OA (osteoarthritis) of knee 12/07/2015  . Edema 02/04/2015    Medications- reviewed and updated Current Outpatient Prescriptions  Medication Sig Dispense Refill  . atenolol (TENORMIN) 25 MG tablet Take 1 tablet (25 mg total) by mouth daily. 90 tablet 3  . atorvastatin (LIPITOR) 10 MG tablet Take 1 tablet (10 mg total) by mouth daily. 90 tablet 3  . clopidogrel (PLAVIX) 75 MG tablet Take 1 tablet (75 mg total) by mouth daily. 90 tablet 3  . furosemide (LASIX) 20 MG tablet Take 1 tablet (20 mg total) by mouth daily as needed for edema. 30 tablet 6  . mercaptopurine (PURINETHOL) 50 MG tablet TAKE 1 AND 1/2 TABLETS DAILY.GIVE ON AN EMPTY STOMACH 1 HOUR BEFORE OR 2 HOURS AFTER MEAL. 45 tablet 5  . nitroGLYCERIN (NITROSTAT) 0.4 MG SL tablet Place 0.4 mg under the tongue  every 5 (five) minutes as needed for chest pain.    . pantoprazole (PROTONIX) 40 MG tablet Take 1 tablet (40 mg total) by mouth 2 (two) times daily. 180 tablet 3  . valsartan-hydrochlorothiazide (DIOVAN-HCT) 160-25 MG tablet Take 1 tablet by mouth daily. 90 tablet 3   Objective: BP 124/76 (BP Location: Left Arm, Patient Position: Sitting, Cuff Size: Large)   Pulse (!) 57   Temp 97.9 F (36.6 C) (Oral)   Wt 214 lb 9.6 oz (97.3 kg)   SpO2 96%   BMI 33.61 kg/m  Gen: NAD, resting comfortably CV: RRR no murmurs rubs or gallops Lungs: CTAB no crackles, wheeze, rhonchi Abdomen: soft/nontender/nondistended/normal bowel sounds. obese Ext: no edema Skin: warm, dry Neuro: grossly normal, moves all extremities  Assessment/Plan:  Hypertension S: controlled. On valsartan hctz 160-59m BP Readings from Last 3 Encounters:  06/06/16 124/76  05/13/16 116/68  01/04/16 122/70  A/P:Continue current meds:  Doing well  Hyperlipidemia S: well controlled on atorvastatin. 121m No myalgias.  Lab Results  Component Value Date   CHOL 101 06/08/2015   HDL 25.90 (L) 06/08/2015   LDLCALC 49 06/08/2015   TRIG 131.0 06/08/2015   CHOLHDL 4 06/08/2015   A/P: LDL at goal <70. Considered updating today but day too early for full lipids  CAD S: CAD- saw cardiology 01/04/16- no change in symtoms.  Continued on plavix, atorvastatin A/P: will see cards back in a year- continue medical management at present  Patient has also lost about 9 lbs over this year. Cut back on sweets, more active in yard  Wants to See Dr. Jerrell Belfast (sp?)- has seen before. Horned lesion on right side of neck- likely needs biopsy  Labs at time of next visit- make sure to update a1c, lipid, cbc, cmp Return in about 3 months (around 09/06/2016) for physical, come in fasting for visit.  Orders Placed This Encounter  Procedures  . Ambulatory referral to Dermatology    Referral Priority:   Routine    Referral Type:   Consultation     Referral Reason:   Specialty Services Required    Requested Specialty:   Dermatology    Number of Visits Requested:   1   Return precautions advised.  Garret Reddish, MD

## 2016-06-06 NOTE — Progress Notes (Signed)
Pre visit review using our clinic review tool, if applicable. No additional management support is needed unless otherwise documented below in the visit note. 

## 2016-06-06 NOTE — Patient Instructions (Addendum)
No changes to medication  Glad the knees are better. Great job losing a few lbs- keep up the good work

## 2016-06-14 ENCOUNTER — Telehealth: Payer: Self-pay | Admitting: Gastroenterology

## 2016-06-14 MED ORDER — MERCAPTOPURINE 50 MG PO TABS: ORAL_TABLET | ORAL | 1 refills | Status: DC

## 2016-06-14 NOTE — Telephone Encounter (Signed)
Informed patient that I sent his prescription refill to [facility] but he needs to schedule a follow visit to see Dr. Fuller Plan. Patient agreed and scheduled f/u for 08/11/16 at 9:45am.

## 2016-07-12 ENCOUNTER — Other Ambulatory Visit: Payer: Self-pay | Admitting: Dermatology

## 2016-07-12 DIAGNOSIS — L918 Other hypertrophic disorders of the skin: Secondary | ICD-10-CM | POA: Diagnosis not present

## 2016-07-12 DIAGNOSIS — L570 Actinic keratosis: Secondary | ICD-10-CM | POA: Diagnosis not present

## 2016-07-12 DIAGNOSIS — D485 Neoplasm of uncertain behavior of skin: Secondary | ICD-10-CM | POA: Diagnosis not present

## 2016-08-11 ENCOUNTER — Encounter (INDEPENDENT_AMBULATORY_CARE_PROVIDER_SITE_OTHER): Payer: Self-pay

## 2016-08-11 ENCOUNTER — Ambulatory Visit (INDEPENDENT_AMBULATORY_CARE_PROVIDER_SITE_OTHER): Payer: Commercial Managed Care - HMO | Admitting: Gastroenterology

## 2016-08-11 ENCOUNTER — Encounter: Payer: Self-pay | Admitting: Gastroenterology

## 2016-08-11 ENCOUNTER — Other Ambulatory Visit (INDEPENDENT_AMBULATORY_CARE_PROVIDER_SITE_OTHER): Payer: Commercial Managed Care - HMO

## 2016-08-11 VITALS — BP 116/66 | HR 56 | Ht 67.0 in | Wt 215.0 lb

## 2016-08-11 DIAGNOSIS — Z86010 Personal history of colonic polyps: Secondary | ICD-10-CM

## 2016-08-11 DIAGNOSIS — K5150 Left sided colitis without complications: Secondary | ICD-10-CM | POA: Diagnosis not present

## 2016-08-11 DIAGNOSIS — K219 Gastro-esophageal reflux disease without esophagitis: Secondary | ICD-10-CM | POA: Diagnosis not present

## 2016-08-11 LAB — CBC WITH DIFFERENTIAL/PLATELET
BASOS ABS: 0.1 10*3/uL (ref 0.0–0.1)
Basophils Relative: 0.7 % (ref 0.0–3.0)
EOS ABS: 0.2 10*3/uL (ref 0.0–0.7)
Eosinophils Relative: 2.7 % (ref 0.0–5.0)
HEMATOCRIT: 45.7 % (ref 39.0–52.0)
HEMOGLOBIN: 15.6 g/dL (ref 13.0–17.0)
LYMPHS PCT: 24.7 % (ref 12.0–46.0)
Lymphs Abs: 2.3 10*3/uL (ref 0.7–4.0)
MCHC: 34.2 g/dL (ref 30.0–36.0)
MCV: 86.3 fl (ref 78.0–100.0)
Monocytes Absolute: 1.1 10*3/uL — ABNORMAL HIGH (ref 0.1–1.0)
Monocytes Relative: 11.6 % (ref 3.0–12.0)
Neutro Abs: 5.5 10*3/uL (ref 1.4–7.7)
Neutrophils Relative %: 60.3 % (ref 43.0–77.0)
Platelets: 248 10*3/uL (ref 150.0–400.0)
RBC: 5.29 Mil/uL (ref 4.22–5.81)
RDW: 16.1 % — ABNORMAL HIGH (ref 11.5–15.5)
WBC: 9.2 10*3/uL (ref 4.0–10.5)

## 2016-08-11 LAB — COMPREHENSIVE METABOLIC PANEL
ALBUMIN: 3.8 g/dL (ref 3.5–5.2)
ALK PHOS: 68 U/L (ref 39–117)
ALT: 25 U/L (ref 0–53)
AST: 19 U/L (ref 0–37)
BILIRUBIN TOTAL: 0.6 mg/dL (ref 0.2–1.2)
BUN: 11 mg/dL (ref 6–23)
CALCIUM: 9.3 mg/dL (ref 8.4–10.5)
CO2: 33 meq/L — AB (ref 19–32)
CREATININE: 1.26 mg/dL (ref 0.40–1.50)
Chloride: 100 mEq/L (ref 96–112)
GFR: 60.26 mL/min (ref >60.00)
Glucose, Bld: 94 mg/dL (ref 70–99)
Potassium: 3.2 mEq/L — ABNORMAL LOW (ref 3.5–5.1)
Sodium: 139 mEq/L (ref 135–145)
Total Protein: 7.6 g/dL (ref 6.0–8.3)

## 2016-08-11 LAB — HIGH SENSITIVITY CRP: CRP HIGH SENSITIVITY: 3.64 mg/L (ref 0.000–5.000)

## 2016-08-11 LAB — SEDIMENTATION RATE: SED RATE: 16 mm/h (ref 0–20)

## 2016-08-11 LAB — LIPASE: LIPASE: 32 U/L (ref 11.0–59.0)

## 2016-08-11 MED ORDER — MERCAPTOPURINE 50 MG PO TABS: ORAL_TABLET | ORAL | 5 refills | Status: DC

## 2016-08-11 NOTE — Progress Notes (Signed)
    History of Present Illness: This is a 70 year old male returning for follow-up of left sided ulcerative colitis and GERD. He has no active colitis symptoms. He has occasional episodes of breakthrough reflux symptoms that are controlled with Tums. Although prescribed pantoprazole twice daily states he generally takes it once daily and increases to twice daily when his symptoms flare.  Current Medications, Allergies, Past Medical History, Past Surgical History, Family History and Social History were reviewed in [employer] record.  Physical Exam: General: Well developed, well nourished, no acute distress Head: Normocephalic and atraumatic Eyes:  sclerae anicteric, EOMI Ears: Normal auditory acuity Mouth: No deformity or lesions Lungs: Clear throughout to auscultation Heart: Regular rate and rhythm; no murmurs, rubs or bruits Abdomen: Soft, non tender and non distended. No masses, hepatosplenomegaly or hernias noted. Normal Bowel sounds Musculoskeletal: Symmetrical with no gross deformities  Pulses:  Normal pulses noted Extremities: No clubbing, cyanosis, edema or deformities noted Neurological: Alert oriented x 4, grossly nonfocal Psychological:  Alert and cooperative. Normal mood and affect  Assessment and Recommendations:  1. Left sided ulcerative colitis. CMP, CBC, lipase, ESR, CRP today. Refill 6 MP. Personal history of adenomatous colon polyps. Surveillance colonoscopy for polyps and UC recommended in January 2019. REV in 6 months.  2.  GERD. Continue pantoprazole 40 mg daily. Increase to twice daily when symptoms flare. May use Tums when necessary. Follow standard antireflux measures.

## 2016-08-11 NOTE — Patient Instructions (Signed)
We have sent the following medications to your pharmacy for you to pick up at your convenience: Mercaptopurine 75 mg daily  Your physician has requested that you go to the basement for the following lab work before leaving today: CMP, CBC, lipase, sed rate, CRP  Please follow up with Dr Fuller Plan in 6 months.  If you are age 69 or older, your body mass index should be between 23-30. Your Body mass index is 33.67 kg/m. If this is out of the aforementioned range listed, please consider follow up with your Primary Care Provider.  If you are age 4 or younger, your body mass index should be between 19-25. Your Body mass index is 33.67 kg/m. If this is out of the aformentioned range listed, please consider follow up with your Primary Care Provider.

## 2016-08-24 DIAGNOSIS — G4733 Obstructive sleep apnea (adult) (pediatric): Secondary | ICD-10-CM | POA: Diagnosis not present

## 2016-09-13 ENCOUNTER — Other Ambulatory Visit: Payer: Self-pay | Admitting: Family Medicine

## 2016-09-13 ENCOUNTER — Encounter: Payer: Self-pay | Admitting: Family Medicine

## 2016-09-13 ENCOUNTER — Ambulatory Visit (INDEPENDENT_AMBULATORY_CARE_PROVIDER_SITE_OTHER): Payer: Commercial Managed Care - HMO | Admitting: Family Medicine

## 2016-09-13 VITALS — BP 130/76 | HR 54 | Temp 97.8°F | Ht 67.75 in | Wt 217.4 lb

## 2016-09-13 DIAGNOSIS — Z0000 Encounter for general adult medical examination without abnormal findings: Secondary | ICD-10-CM

## 2016-09-13 DIAGNOSIS — N183 Chronic kidney disease, stage 3 unspecified: Secondary | ICD-10-CM

## 2016-09-13 DIAGNOSIS — Z23 Encounter for immunization: Secondary | ICD-10-CM

## 2016-09-13 DIAGNOSIS — I10 Essential (primary) hypertension: Secondary | ICD-10-CM

## 2016-09-13 DIAGNOSIS — R739 Hyperglycemia, unspecified: Secondary | ICD-10-CM | POA: Diagnosis not present

## 2016-09-13 DIAGNOSIS — Z7289 Other problems related to lifestyle: Secondary | ICD-10-CM

## 2016-09-13 DIAGNOSIS — E785 Hyperlipidemia, unspecified: Secondary | ICD-10-CM

## 2016-09-13 LAB — HEMOGLOBIN A1C: HEMOGLOBIN A1C: 6 % (ref 4.6–6.5)

## 2016-09-13 LAB — CBC
HEMATOCRIT: 45.9 % (ref 39.0–52.0)
Hemoglobin: 15.3 g/dL (ref 13.0–17.0)
MCHC: 33.3 g/dL (ref 30.0–36.0)
MCV: 87.7 fl (ref 78.0–100.0)
Platelets: 231 10*3/uL (ref 150.0–400.0)
RBC: 5.24 Mil/uL (ref 4.22–5.81)
RDW: 16.7 % — AB (ref 11.5–15.5)
WBC: 7.9 10*3/uL (ref 4.0–10.5)

## 2016-09-13 LAB — LIPID PANEL
CHOLESTEROL: 113 mg/dL (ref 0–200)
HDL: 29.3 mg/dL — AB (ref >39.00)
LDL Cholesterol: 53 mg/dL (ref 0–99)
NONHDL: 83.94
Total CHOL/HDL Ratio: 4
Triglycerides: 155 mg/dL — ABNORMAL HIGH (ref 0.0–149.0)
VLDL: 31 mg/dL (ref 0.0–40.0)

## 2016-09-13 LAB — COMPREHENSIVE METABOLIC PANEL
ALBUMIN: 4.1 g/dL (ref 3.5–5.2)
ALK PHOS: 60 U/L (ref 39–117)
ALT: 31 U/L (ref 0–53)
AST: 21 U/L (ref 0–37)
BILIRUBIN TOTAL: 0.7 mg/dL (ref 0.2–1.2)
BUN: 13 mg/dL (ref 6–23)
CO2: 32 mEq/L (ref 19–32)
Calcium: 10.1 mg/dL (ref 8.4–10.5)
Chloride: 100 mEq/L (ref 96–112)
Creatinine, Ser: 1.22 mg/dL (ref 0.40–1.50)
GFR: 62.53 mL/min (ref >60.00)
GLUCOSE: 97 mg/dL (ref 70–99)
Potassium: 3.3 mEq/L — ABNORMAL LOW (ref 3.5–5.1)
Sodium: 142 mEq/L (ref 135–145)
TOTAL PROTEIN: 7.5 g/dL (ref 6.0–8.3)

## 2016-09-13 MED ORDER — NITROGLYCERIN 0.4 MG SL SUBL: 0.4000 mg | SUBLINGUAL_TABLET | SUBLINGUAL | 5 refills | Status: DC | PRN

## 2016-09-13 NOTE — Progress Notes (Signed)
Pre visit review using our clinic review tool, if applicable. No additional management support is needed unless otherwise documented below in the visit note. 

## 2016-09-13 NOTE — Progress Notes (Signed)
Phone: [phone]  Subjective:  Patient presents today for their annual physical. Chief complaint-noted.   See problem oriented charting- ROS- full  review of systems was completed and negative except for: achy joints from arthritis at times  The following were reviewed and entered/updated in epic: Past Medical History:  Diagnosis Date  . Adenomatous polyps 03/2004, 11/2012  . Arthritis   . Coronary atherosclerosis of unspecified type of vessel, native or graft   . Esophagitis 1993  . GERD (gastroesophageal reflux disease)   . Hyperlipidemia   . Hypertension   . Myocardial infarction 1993  . Sleep apnea    cpap  . Stroke ([place]) 02/2014  . Trigger finger (acquired)   . 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 ULCERATIVE COLITIS-LEFT SIDE 07/03/2008    Priority: High  . CAD (coronary artery disease) 06/15/2007    Priority: High  . Hyperglycemia 06/08/2015    Priority: Medium  . CKD (chronic kidney disease), stage III 10/24/2014    Priority: Medium  . Sleep apnea 07/02/2008    Priority: Medium  . Hyperlipidemia 06/14/2007    Priority: Medium  . Essential hypertension 06/14/2007    Priority: Medium  . OA (osteoarthritis) of knee 12/07/2015    Priority: Low  . Former smoker 06/08/2015    Priority: Low  . Allergic rhinitis 10/24/2014    Priority: Low  . Obesity (BMI 30-39.9) 04/25/2014    Priority: Low  . Right cervical radiculopathy 04/12/2013    Priority: Low  . Low back pain 01/11/2008    Priority: Low  . GERD 11/07/2007    Priority: Low  . Trigger finger, acquired 10/23/2007    Priority: Low  . History of colonic polyps 06/14/2007    Priority: Low  . Edema 02/04/2015   Past Surgical History:  Procedure Laterality Date  . ANGIOPLASTY  1993  . CHOLECYSTECTOMY  2002  . LUMBAR FUSION  1983  . ROTATOR CUFF REPAIR  2009   right  . TRIGGER FINGER RELEASE     right    Family History    Problem Relation Age of Onset  . Stroke Mother   . COPD Father   . Heart disease Sister     valve replacement  . CAD Brother   . Heart attack Brother   . Colon cancer Neg Hx   . Esophageal cancer Neg Hx   . Liver disease Neg Hx   . Diabetes Neg Hx   . Hypertension Neg Hx     Medications- reviewed and updated Current Outpatient Prescriptions  Medication Sig Dispense Refill  . atenolol (TENORMIN) 25 MG tablet Take 1 tablet (25 mg total) by mouth daily. 90 tablet 3  . atorvastatin (LIPITOR) 10 MG tablet Take 1 tablet (10 mg total) by mouth daily. 90 tablet 3  . clopidogrel (PLAVIX) 75 MG tablet Take 1 tablet (75 mg total) by mouth daily. 90 tablet 3  . furosemide (LASIX) 20 MG tablet Take 1 tablet (20 mg total) by mouth daily as needed for edema. 30 tablet 6  . mercaptopurine (PURINETHOL) 50 MG tablet TAKE 1 AND 1/2 TABLETS DAILY.GIVE ON AN EMPTY STOMACH 1 HOUR BEFORE OR 2 HOURS AFTER MEAL. 45 tablet 5  . nitroGLYCERIN (NITROSTAT) 0.4 MG SL tablet Place 1 tablet (0.4 mg total) under the tongue every 5 (five) minutes as needed for chest pain. 20 tablet 5  . pantoprazole (PROTONIX) 40 MG tablet Take 1 tablet (40 mg  total) by mouth 2 (two) times daily. 180 tablet 3  . valsartan-hydrochlorothiazide (DIOVAN-HCT) 160-25 MG tablet Take 1 tablet by mouth daily. 90 tablet 3   No current facility-administered medications for this visit.     Allergies-reviewed and updated Allergies  Allergen Reactions  . Penicillins     REACTION: rash    Social History   Social History  . Marital status: Married    Spouse name: N/A  . Number of children: 3  . Years of education: N/A   Occupational History  . 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 Smoking status: Former Smoker    Packs/day: 1.00    Years: 3.00    Types: Cigarettes  . Smokeless tobacco: Former Systems developer    Types: Chew     Comment: quit 50 years ago  . Alcohol use No  . Drug use:  No  . Sexual activity: Not Asked   Other Topics Concern  . None   Social History Narrative   Married (wife patient elsewhere). 3 children- son is a patient here. 4 grandkids.       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 Oropharynx normal Neck: no thyromegaly CV: RRR no murmurs rubs or gallops Lungs: CTAB no crackles, wheeze, rhonchi Abdomen: soft/nontender/nondistended/normal bowel sounds. No rebound or guarding.  Ext: no edema, 2+ PT pulses Skin: warm, dry, no obvious melanoma, BCC, SCC. Several AKs as noted below. Several skin tags but none irritated at present Neuro: grossly normal, moves all extremities, PERRLA Rectal: normal tone, diffusely enlarged prostate, no masses or tenderness  Assessment/Plan:  70 y.o. male presenting for annual physical.  Health Maintenance counseling: 1. Anticipatory guidance: Patient counseled regarding regular dental exams, eye exams, wearing seatbelts.  2. Risk factor reduction:  Advised patient of need for regular exercise and diet rich and fruits and vegetables to reduce risk of heart attack and stroke.  3. Immunizations/screenings/ancillary studies Immunization History  Administered Date(s) Administered  . Influenza Split 08/19/2011, 08/17/2012  . Influenza Whole 10/23/2007, 08/14/2009, 08/13/2010  . Influenza, High Dose Seasonal PF 09/13/2016  . Influenza,inj,Quad PF,36+ Mos 08/16/2013  . Influenza-Unspecified 08/28/2014, 09/07/2015  . Pneumococcal  Conjugate-13 06/08/2015  . Pneumococcal Polysaccharide-23 04/25/2014  . Td 11/14/2000, 09/21/2010   Health Maintenance Due  Topic Date Due  . Hepatitis C Screening  09-19-1946  . ZOSTAVAX  05/07/2007   4. Prostate cancer screening- some BPH on exam but No nocturia- PSA likely not covered and opts out Lab Results  Component Value Date   PSA 1.17 03/11/2014   PSA 0.84 04/05/2013   PSA 0.79 01/13/2012   5. Colon cancer screening - 12/14/11 with 5 year repeat 6. Skin cancer screening- several actinic keratosis noted.  O: 1 on both ear. 2 on both cheeks A/P: cryotherapy completed today with 1 cycle of 5 to 7 seconds for  each. Appropriate aftercare discussed  Status of chronic or acute concerns   HTN S: controlled on valsartan- hctz and atenolol... Some low potassium. Rare lasix BP Readings from Last 3 Encounters:  09/13/16 130/76  08/11/16 116/66  06/06/16 124/76  A/P:Continue current meds:  Doing well  HLD- controlled on atorvastatin 25m  CAD- on plavix, atorvastatin. LDL has been at goal <70- update today. plavix also indicated with history of CVA affecting visiont April 2015 Lab Results  Component Value Date   CHOL 101 06/08/2015   HDL 25.90 (L) 06/08/2015   LDLCALC 49 06/08/2015   TRIG 131.0 06/08/2015   CHOLHDL 4 06/08/2015   Hyperglycemia- at risk for diabetes.  Wt Readings from Last 3 Encounters:  09/13/16 217 lb 6.4 oz (98.6 kg)  08/11/16 215 lb (97.5 kg)  06/06/16 214 lb 9.6 oz (97.3 kg)  prior had lost 9 lbs through July- now up 3. Had cut back on sweets and been more active in yard. Update a1c  [facility] year. Thinking about reestablishing- he did have 4 Aks which we treated today  Ulcerative colitis- follows with Dr. SFuller Planon mercaptopurine.   OSA on cpap  CKD III- monitor with Bmet today  GERD- controlled on protonix  Return in about 6 months (around 03/13/2017). Plus AWV next available with SManuela Schwartz may get shingles shot at  that time.   Orders Placed This Encounter  Procedures  . Flu vaccine HIGH DOSE PF  . CBC    [place]  . Comprehensive metabolic panel    [place]    Order Specific Question:   Has the patient fasted?    Answer:   No  . Lipid panel    [place]    Order Specific Question:   Has the patient fasted?    Answer:   No  . Hemoglobin A1c      . Hepatitis C antibody, reflex    [facility]    Meds ordered this encounter  Medications  . nitroGLYCERIN (NITROSTAT) 0.4 MG SL tablet    Sig: Place 1 tablet (0.4 mg total) under the tongue every 5 (five) minutes as needed for chest pain.    Dispense:  20 tablet    Refill:  5    Return precautions advised.   SGarret Reddish MD

## 2016-09-13 NOTE — Patient Instructions (Signed)
Labs before you leave  Refilled nitroglycerin  Cryotherapy performed- likely blister up. Use vaseline or neosporin and a bandaid if needed. If expanding redness after a day or two or fever or worsening pain- return to see [place]  Call your insurace about the shingles shot to see if it is covered or how much it would cost and where is cheaper (here or pharmacy).  If you want to receive here, call for nurse visit.  I would also like for you to sign up for an annual wellness visit with our nurse, Manuela Schwartz, before end of year if possible who specializes in the annual wellness exam. This is a free benefit under medicare that may help [place] find additional ways to help you. Some highlights are reviewing medications, lifestyle, and doing a dementia screen.

## 2016-09-14 ENCOUNTER — Telehealth: Payer: Self-pay | Admitting: Family Medicine

## 2016-09-14 LAB — HEPATITIS C ANTIBODY: HCV Ab: NEGATIVE

## 2016-09-14 NOTE — Telephone Encounter (Signed)
Spoke with patient regarding lab results. 

## 2016-09-14 NOTE — Telephone Encounter (Signed)
Jamie pt returned your call. °

## 2016-10-04 ENCOUNTER — Telehealth: Payer: Self-pay | Admitting: Gastroenterology

## 2016-10-04 MED ORDER — PANTOPRAZOLE SODIUM 40 MG PO TBEC: 40.0000 mg | DELAYED_RELEASE_TABLET | Freq: Two times a day (BID) | ORAL | 1 refills | Status: DC

## 2016-10-04 NOTE — Telephone Encounter (Signed)
Protonix was sent to [facility] mail order pharmacy and patient notified. Patient verbalized understanding.

## 2016-10-18 ENCOUNTER — Ambulatory Visit (INDEPENDENT_AMBULATORY_CARE_PROVIDER_SITE_OTHER): Payer: Commercial Managed Care - HMO

## 2016-10-18 VITALS — BP 130/70 | Ht 68.0 in | Wt 217.0 lb

## 2016-10-18 DIAGNOSIS — Z0000 Encounter for general adult medical examination without abnormal findings: Secondary | ICD-10-CM | POA: Diagnosis not present

## 2016-10-18 NOTE — Progress Notes (Signed)
Subjective:   [name] is a 70 y.o. male who presents for an Initial Medicare Annual Wellness Visit.  The Patient was informed that the wellness visit is to identify future health risk and educate and initiate measures that can reduce risk for increased disease through the lifespan.    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 Frederico Hamman;  States Dr. Carles Collet in neurology; doesn't think he had a stroke  MI 45; see cardiology one time a year with no issues  Advanced aged > 75 in men; Hyperlipidemia - chol 113; trig 155; HDL 39; LDL 53;  Diabetes (a1c 6.0: pre-diabetes education provided)  Family History (mother had stroke; father had copd; sister HD; brother has HD )  Obesity - BMI 33   Eye exam followed by [facility] ; dr. Frederico Hamman for double vision which comes and goes and is not sure why;  Dr. Carles Collet following as well  Depression Screen PhQ 2: negative  Activities of Daily Living - See functional screen   Cognitive testing; Has slow affect  MMSE 30/30 Completed serial 3s from 20 x 5   Advanced Directives will take [place] forms to review and sign; stated he would need to complete an AD and agreed to take copy today   Patient Care Team: Marin Olp, MD as PCP - General (Family Medicine) Dr. Fuller Plan Dr. Frederico Hamman for eyes   Immunization History  Administered Date(s) Administered  . Influenza Split 08/19/2011, 08/17/2012  . Influenza Whole 10/23/2007, 08/14/2009, 08/13/2010  . Influenza, High Dose Seasonal PF 09/13/2016  . Influenza,inj,Quad PF,36+ Mos 08/16/2013  . Influenza-Unspecified 08/28/2014, 09/07/2015  . Pneumococcal Conjugate-13 06/08/2015  . Pneumococcal Polysaccharide-23 04/25/2014  . Td 11/14/2000, 09/21/2010   Required Immunizations needed today  Screening test up to date or reviewed for plan of completion Health Maintenance Due  Topic Date Due  . ZOSTAVAX  05/07/2007   Discussed shingles; states he had the shingles one time  2018 vaccine coming Given recommendations on checking on coverage in the office or in the pharmacy;        Objective:    Today's Vitals   10/18/16 1323  BP: 130/70  Weight: 217 lb (98.4 kg)  Height: 5' 8"  (1.727 m)   Body mass index is 32.99 kg/m.  Current Medications (verified) Outpatient Encounter Prescriptions as of 10/18/2016    Medication Sig  . atenolol (TENORMIN) 25 MG tablet Take 1 tablet (25 mg total) by mouth daily.  Marland Kitchen atorvastatin (LIPITOR) 10 MG tablet Take 1 tablet (10 mg total) by mouth daily.  . clopidogrel (PLAVIX) 75 MG tablet Take 1 tablet (75 mg total) by mouth daily.  . furosemide (LASIX) 20 MG tablet Take 1 tablet (20 mg total) by mouth daily as needed for edema.  . mercaptopurine (PURINETHOL) 50 MG tablet TAKE 1 AND 1/2 TABLETS DAILY.GIVE ON AN EMPTY STOMACH 1 HOUR BEFORE OR 2 HOURS AFTER MEAL.  . nitroGLYCERIN (NITROSTAT) 0.4 MG SL tablet Place 1 tablet (0.4 mg total) under the tongue every 5 (five) minutes as needed for chest pain.  . pantoprazole (PROTONIX) 40 MG tablet Take 1 tablet (40 mg total) by mouth 2 (two) times daily.  . valsartan-hydrochlorothiazide (DIOVAN-HCT) 160-25 MG tablet Take 1 tablet by mouth daily.   No facility-administered encounter medications on file as of 10/18/2016.     Allergies (verified) Penicillins   History: Past Medical History:  Diagnosis Date  . Adenomatous polyps 03/2004, 11/2012  . Arthritis   . Coronary atherosclerosis of unspecified type of vessel, native or graft   . Esophagitis 1993  . GERD (gastroesophageal reflux disease)   . Hyperlipidemia   . Hypertension   . Myocardial infarction 1993  . Sleep apnea    cpap  . Stroke ([place]) 02/2014  . Trigger finger (acquired)   . 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 ANGIOPLASTY  1993  . CHOLECYSTECTOMY  2002  . LUMBAR FUSION  1983  . ROTATOR CUFF REPAIR  2009   right  . TRIGGER FINGER RELEASE     right   Family History  Problem Relation Age of Onset  . Stroke Mother   . COPD Father   . Heart disease Sister     valve replacement  . CAD Brother   . Heart attack Brother   . Colon cancer Neg Hx   . Esophageal cancer Neg Hx   . Liver disease Neg Hx   . Diabetes Neg Hx   . Hypertension Neg Hx    Social History    Occupational History  . 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 Smoking status: Former Smoker    Packs/day: 1.00    Years: 3.00    Types: Cigarettes  . Smokeless tobacco: Former Systems developer    Types: Chew     Comment: quit 50 years ago  . Alcohol use No  . Drug use: No  . Sexual activity: Not on file   Tobacco Counseling Counseling given: Yes   Activities of Daily Living In your present state of health, do you have any difficulty performing the following activities: 10/18/2016 10/18/2016  Hearing? - N  Vision? - N  Difficulty concentrating or making decisions? N Y  Walking or climbing stairs? N -  Dressing or bathing? N -  Doing errands, shopping? N -  Preparing Food and eating ? N -  Using the Toilet? N -  In the past six months, have you accidently leaked urine? N -  Do you have problems with loss of bowel control? N -  Managing your Medications? N -  Managing your Finances? N -  Housekeeping or managing your Housekeeping? N -  Some recent data might be hidden    Immunizations and Health Maintenance Immunization History  Administered Date(s) Administered  . Influenza Split 08/19/2011, 08/17/2012  . Influenza Whole 10/23/2007, 08/14/2009, 08/13/2010  . Influenza, High Dose Seasonal PF 09/13/2016  . Influenza,inj,Quad PF,36+ Mos 08/16/2013  . Influenza-Unspecified 08/28/2014, 09/07/2015  . Pneumococcal Conjugate-13 06/08/2015  . Pneumococcal Polysaccharide-23 04/25/2014  . Td 11/14/2000, 09/21/2010   Health Maintenance Due  Topic Date Due  . ZOSTAVAX  05/07/2007    Patient Care Team: Marin Olp, MD as PCP - General (Family Medicine)  Indicate any recent Medical Services you may have received from other than [facility] providers in the past year (date may be approximate).    Assessment:   This is a routine wellness examination for Illya.   Educated regarding pre-diabetes; given information to assist with  decreasing risk as exercise and weight loss of approx 10 to 12 lbs. Agreed to work on both;  Exercise as long as vision is good   Discussed Advanced Directives and will share [place] forms with spouse.  Vision followed by Dr. Frederico Hamman  Hearing/Vision screen  Hearing Screening   125Hz  250Hz  500Hz  1000Hz  2000Hz  3000Hz  4000Hz  6000Hz  8000Hz   Right ear:       100    Left ear:     100      Vision Screening Comments: Checks eye sight; vision is not as good Had eye exam in June; only wears glasses   Dietary issues and exercise activities discussed: Current Exercise Habits: Structured exercise class (to start exercise class )  Goals    . Exercise 150 minutes per week (moderate activity)          Will try to get back on the silver sneaker program and occasionally walk on the treadmill once double vision is cleared   Older adults aged 61 or older, who are generally fit and have no health conditions that limit their mobility, should try to be active daily and should do: at least 150 minutes of moderate aerobic activity such as cycling or walking every week, and  strength exercises on two or more days a week that work all the major muscles (legs, hips, back, abdomen, chest, shoulders and arms).      OR  75 minutes of vigorous aerobic activity such as running or a game of singles tennis every week, and  strength exercises on two or more days a week that work all the major muscles (legs, hips, back, abdomen, chest, shoulders and arms).     OR  a mix of moderate and vigorous aerobic activity every week. For example, two 30-minute runs, plus 30 minutes of fast walking, equates to 150 minutes of moderate aerobic activity, and  strength exercises on two or more days a week that work all the major muscles (legs, hips, back, abdomen, chest, shoulders and arms).  A rule of thumb is that one minute of vigorous activity provides the same health benefits as two minutes of moderate activity.  You  should also try to break up long periods of sitting with light activity, as sedentary behaviour is now considered an independent risk factor for ill health, no matter how much exercise you do. Find out why sitting is bad for your health. Older adults at risk of falls, such as people with weak legs, poor balance and some medical conditions, should do exercises to improve balance and co-ordination on at least two days a week. Examples include yoga, tai chi and dancing.      . Weight (lb) < 200 lb (90.7 kg)          Try to eat less sugar; cookies and cakes        Depression Screen PHQ 2/9 Scores 10/18/2016 09/13/2016 06/08/2015 04/25/2014  PHQ - 2 Score 0 0 0 0    Fall Risk Fall Risk  10/18/2016 09/13/2016 06/08/2015 04/25/2014 08/16/2013  Falls in the past year? _0     Cognitive Function: MMSE - Mini Mental State Exam 10/18/2016  Orientation to time 5  Orientation to Place 5  Registration 3  Attention/ Calculation 5  Recall 3  Language- name 2 objects 2  Language- repeat 1  Language- follow 3 step command 3  Language- read & follow direction 1  Write a sentence 1  Copy design 1  Total score 30        Screening Tests Health Maintenance  Topic Date Due  . ZOSTAVAX  05/07/2007  . COLONOSCOPY  12/13/2017  . TETANUS/TDAP  09/21/2020  . INFLUENZA VACCINE  Completed  . Hepatitis C Screening  Completed  . 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 Carles Collet suspicious of pre diabetes and educated   Patient concerns; weight loss  And exercise;    Nurse Concerns; none  Next PCP apt just  completed 10/31   During the course of the visit [place] was educated and counseled about the following appropriate screening and preventive services:   Vaccines to include Pneumoccal, Influenza, Hepatitis B, Td, Zostavax, HCV  Electrocardiogram  Colorectal cancer screening due 11/2017  Cardiovascular disease screening/ neg  Diabetes screening/ discussed   Glaucoma screening/ neg  Nutrition counseling; discussed weight loss  Prostate cancer screening/ deferred   Smoking cessation counseling n/a  Patient Instructions (the written plan) were given to the patient.   Wynetta Fines, RN   10/18/2016

## 2016-10-18 NOTE — Progress Notes (Signed)
I have reviewed and agree with note, evaluation, plan.   Zerek Litsey, MD  

## 2016-10-18 NOTE — Patient Instructions (Addendum)
[name] , Thank you for taking time to come for your Medicare Wellness Visit. I appreciate your ongoing commitment to your health goals. Please review the following plan we discussed and let me know if I can assist you in the future.  Educated to check with insurance regarding coverage of Shingles vaccination on Part D or Part B and may have lower co-pay if provided on the Part D side You can always file a complaint with medicare if you cannot reach the customer service line to inquire about your shingles shot You can also check with your pharmacy, as they may have access to your part D benefit  There is suppose to be a new vaccine and 2018; ask your dtr to assist    [facility] - can assist with hearing aid x 1  No reviews  [place] [employer] [facility]  [address]  [phone]  Will compete advanced Directive and call the pastoral team for questions       These are the goals we discussed: Goals    . Exercise 150 minutes per week (moderate activity)          Will try to get back on the silver sneaker program  Older adults aged 65 or older, who are generally fit and have no health conditions that limit their mobility, should try to be active daily and should do: at least 150 minutes of moderate aerobic activity such as cycling or walking every week, and  strength exercises on two or more days a week that work all the major muscles (legs, hips, back, abdomen, chest, shoulders and arms).      OR  75 minutes of vigorous aerobic activity such as running or a game of singles tennis every week, and  strength exercises on two or more days a week that work all the major muscles (legs, hips, back, abdomen, chest, shoulders and arms).     OR  a mix of moderate and vigorous aerobic activity every week. For example, two 30-minute runs, plus 30 minutes of fast walking, equates to 150 minutes of moderate aerobic activity, and  strength exercises on two or  more days a week that work all the major muscles (legs, hips, back, abdomen, chest, shoulders and arms).  A rule of thumb is that one minute of vigorous activity provides the same health benefits as two minutes of moderate activity. You should also try to break up long periods of sitting with light activity, as sedentary behaviour is now considered an independent risk factor for ill health, no matter how much exercise you do. Find out why sitting is bad for your health. Older adults at risk of falls, such as people with weak legs, poor balance and some medical conditions, should do exercises to improve balance and co-ordination on at least two days a week. Examples include yoga, tai chi and dancing.         This is a list of the screening recommended for you and due dates:  Health Maintenance  Topic Date Due  . Shingles Vaccine  05/07/2007  . Colon Cancer Screening  12/13/2017  . Tetanus Vaccine  09/21/2020  . Flu Shot  Completed  .  Hepatitis C: One time screening is recommended by Center for Disease Control  (CDC) for  adults born from 1945 through 1965.   Completed  . Pneumonia vaccines  Completed   Educated regarding prediabetes and numbers;  A1c ranges from 5.8   to 6.5 or fasting Blood sugar > 115 -126; (126 is diabetic)   Risk: >45yo; family hx; overweight or obese; African American; Hispanic; Latino; American Indian; Asian American; Pacific Islander; history of diabetes when pregnant; or birth to a baby weighing over 9 lbs. Being less physically active than 30 minutes; 3 times a week;   Prevention; Losing a modest 7 to 8 lbs; If over 200 lbs; 10 to 14 lbs;  Choose healthier foods; colorful veggies; fish or lean meats; drinks water Reduce portion size Start exercising; 30 minutes of fast walking x 30 minutes per day/ 60 min for weight loss         Fat and Cholesterol Restricted Diet High levels of fat and cholesterol in your blood may lead to various health problems, such as  diseases of the heart, blood vessels, gallbladder, liver, and pancreas. Fats are concentrated sources of energy that come in various forms. Certain types of fat, including saturated fat, may be harmful in excess. Cholesterol is a substance needed by your body in small amounts. Your body makes all the cholesterol it needs. Excess cholesterol comes from the food you eat. When you have high levels of cholesterol and saturated fat in your blood, health problems can develop because the excess fat and cholesterol will gather along the walls of your blood vessels, causing them to narrow. Choosing the right foods will help you control your intake of fat and cholesterol. This will help keep the levels of these substances in your blood within normal limits and reduce your risk of disease. What is my plan? Your health care provider recommends that you:  Limit your fat intake to ______% or less of your total calories per day.  Limit the amount of cholesterol in your diet to less than _________mg per day.  Eat 20-30 grams of fiber each day. What types of fat should I choose?  Choose healthy fats more often. Choose monounsaturated and polyunsaturated fats, such as olive and canola oil, flaxseeds, walnuts, almonds, and seeds.  Eat more omega-3 fats. Good choices include salmon, mackerel, sardines, tuna, flaxseed oil, and ground flaxseeds. Aim to eat fish at least two times a week.  Limit saturated fats. Saturated fats are primarily found in animal products, such as meats, butter, and cream. Plant sources of saturated fats include palm oil, palm kernel oil, and coconut oil.  Avoid foods with partially hydrogenated oils in them. These contain trans fats. Examples of foods that contain trans fats are stick margarine, some tub margarines, cookies, crackers, and other baked goods. What general guidelines do I need to follow? These guidelines for healthy eating will help you control your intake of fat and  cholesterol:  Check food labels carefully to identify foods with trans fats or high amounts of saturated fat.  Fill one half of your plate with vegetables and green salads.  Fill one fourth of your plate with whole grains. Look for the word "whole" as the first word in the ingredient list.  Fill one fourth of your plate with lean protein foods.  Limit fruit to two servings a day. Choose fruit instead of juice.  Eat more foods that contain fiber, such as apples, broccoli, carrots, beans, peas, and barley.  Eat more home-cooked food and less restaurant, buffet, and fast food.  Limit or avoid alcohol.  Limit foods high in starch and sugar.  Limit fried foods.  Cook foods using methods other than frying. Baking, boiling, grilling, and broiling are all great options.    Lose weight if you are overweight. Losing just 5-10% of your initial body weight can help your overall health and prevent diseases such as diabetes and heart disease. What foods can I eat? Grains  Whole grains, such as whole wheat or whole grain breads, crackers, cereals, and pasta. Unsweetened oatmeal, bulgur, barley, quinoa, or brown rice. Corn or whole wheat flour tortillas. Vegetables  Fresh or frozen vegetables (raw, steamed, roasted, or grilled). Green salads. Fruits  All fresh, canned (in natural juice), or frozen fruits. Meats and other protein foods  Ground beef (85% or leaner), grass-fed beef, or beef trimmed of fat. Skinless chicken or turkey. Ground chicken or turkey. Pork trimmed of fat. All fish and seafood. Eggs. Dried beans, peas, or lentils. Unsalted nuts or seeds. Unsalted canned or dry beans. Dairy  Low-fat dairy products, such as skim or 1% milk, 2% or reduced-fat cheeses, low-fat ricotta or cottage cheese, or plain low-fat yo Fats and oils  Tub margarines without trans fats. Light or reduced-fat mayonnaise and salad dressings. Avocado. Olive, canola, sesame, or safflower oils. Natural peanut or  almond butter (choose ones without added sugar and oil). The items listed above may not be a complete list of recommended foods or beverages. Contact your dietitian for more options.  Foods to avoid Grains  White bread. White pasta. White rice. Cornbread. Bagels, pastries, and croissants. Crackers that contain trans fat. Vegetables  White potatoes. Corn. Creamed or fried vegetables. Vegetables in a cheese sauce. Fruits  Dried fruits. Canned fruit in light or heavy syrup. Fruit juice. Meats and other protein foods  Fatty cuts of meat. Ribs, chicken wings, bacon, sausage, bologna, salami, chitterlings, fatback, hot dogs, bratwurst, and packaged luncheon meats. Liver and organ meats. Dairy  Whole or 2% milk, cream, half-and-half, and cream cheese. Whole milk cheeses. Whole-fat or sweetened yogurt. Full-fat cheeses. Nondairy creamers and whipped toppings. Processed cheese, cheese spreads, or cheese curds. Beverages  Alcohol. Sweetened drinks (such as sodas, lemonade, and fruit drinks or punches). Fats and oils  Butter, stick margarine, lard, shortening, ghee, or bacon fat. Coconut, palm kernel, or palm oils. Sweets and desserts  Corn syrup, sugars, honey, and molasses. Candy. Jam and jelly. Syrup. Sweetened cereals. Cookies, pies, cakes, donuts, muffins, and ice cream. The items listed above may not be a complete list of foods and beverages to avoid. Contact your dietitian for more information.  This information is not intended to replace advice given to you by your health care provider. Make sure you discuss any questions you have with your health care provider. Document Released: 10/31/2005 Document Revised: 11/21/2014 Document Reviewed: 01/29/2014 Elsevier Interactive Patient Education  2017 [employer]   Screening for Type 2 Diabetes A screening test for type 2 diabetes (type 2 diabetes mellitus) is a blood test to measure your blood sugar (glucose) level. This test is done to  check for early signs of diabetes, before you develop symptoms. Type 2 diabetes is a long-term (chronic) disease that occurs when the pancreas does not make enough of a hormone called insulin. This results in high blood glucose levels, which can cause many complications. You may be screened for type 2 diabetes as part of your regular health care, especially if you have a high risk for diabetes. Screening can help identify type 2 diabetes at its early stage (prediabetes). Identifying and treating prediabetes may delay or prevent development of type 2 diabetes. What are the risk factors for type 2 diabetes? The following factors may make you more likely   to develop type 2 diabetes:  Having a parent or sibling (first-degree relative) who has diabetes.  Being overweight or obese.  Being of American-Indian, Pacific Islander, Hispanic, Latino, Asian, or African-American descent.  Not getting enough exercise.  Being older than 45.  Having a history of diabetes during pregnancy (gestational diabetes).  Having low levels of good cholesterol (HDL-C) or high levels of blood fats (triglycerides).  Having high blood glucose in a previous blood test.  Having high blood pressure.  Having certain diseases or conditions, including:  Acanthosis nigricans. This is a condition that causes dark skin on the neck, armpits, and groin.  Polycystic ovary syndrome (PCOS).  Heart disease.  Having delivered a baby who weighed more than 9 lb (4.1 kg). Who should be screened for type 2 diabetes? Adults  Adults age 45 and older. These adults should be screened at least once every three years.  Adults who are younger than 45, overweight, and have at least one other risk factor. These adults should be screened at least once every three years.  Adults who have normal blood glucose levels and two or more risk factors. These adults may be screened once every year (annually).  Women who have had gestational  diabetes in the past. These women should be screened at least once every three years.  Pregnant women who have risk factors. These women should be screened at their first prenatal visit.  Pregnant women with no risk factors. These women should be screened between weeks 24 and 28 of pregnancy. Children and adolescents  Children and adolescents should be screened for type 2 diabetes if they are overweight and have 2 of the following risk factors:  A family history of type 2 diabetes.  Being a member of a high risk race or ethnic group.  Signs of insulin resistance or conditions associated with insulin resistance.  A mother who had gestational diabetes while pregnant with him or her.  Screening should be done at least once every three years, starting at age 10. Your health care provider or your child's health care provider may recommend having a screening more or less often. What happens during screening? During screening, your health care provider may ask questions about:  Your health and your risk factors, including your activity level and any medical conditions that you have.  The health of your first-degree relatives.  Past pregnancies, if this applies. Your health care provider will also do a physical exam, including a blood pressure measurement and blood tests. There are four blood tests that can be used to screen for type 2 diabetes. You may have one or more of the following:  A fasting plasma glucose test (FBG). You will not be allowed to eat for at least eight hours before a blood sample is taken.  A random blood glucose test. This test checks your blood glucose at any time of the day regardless of when you ate.  An oral glucose tolerance test (OGTT). This test measures your blood glucose at two times:  After you have not eaten (have fasted) overnight.  Two hours after you drink a glucose-containing beverage. A diagnosis can be made if the level is greater than 200 mg/dL  (11.1 mmol/L).  An A1c test. This test provides information about blood glucose control over the previous three months. What do the results mean? Your test results are a measurement of how much glucose is in your blood. Normal blood glucose levels mean that you do not have diabetes or prediabetes.   High blood glucose levels may mean that you have prediabetes or diabetes. Depending on the results, other tests may be needed to confirm the diagnosis. This information is not intended to replace advice given to you by your health care provider. Make sure you discuss any questions you have with your health care provider. Document Released: 08/27/2009 Document Revised: 04/07/2016 Document Reviewed: 08/28/2015 Elsevier Interactive Patient Education  2017 [employer]   Fall Prevention in the Home Introduction Falls can cause injuries. They can happen to people of all ages. There are many things you can do to make your home safe and to help prevent falls. What can I do on the outside of my home?  Regularly fix the edges of walkways and driveways and fix any cracks.  Remove anything that might make you trip as you walk through a door, such as a raised step or threshold.  Trim any bushes or trees on the path to your home.  Use bright outdoor lighting.  Clear any walking paths of anything that might make someone trip, such as rocks or tools.  Regularly check to see if handrails are loose or broken. Make sure that both sides of any steps have handrails.  Any raised decks and porches should have guardrails on the edges.  Have any leaves, snow, or ice cleared regularly.  Use sand or salt on walking paths during winter.  Clean up any spills in your garage right away. This includes oil or grease spills. What can I do in the bathroom?  Use night lights.  Install grab bars by the toilet and in the tub and shower. Do not use towel bars as grab bars.  Use non-skid mats or decals in the tub or  shower.  If you need to sit down in the shower, use a plastic, non-slip stool.  Keep the floor dry. Clean up any water that spills on the floor as soon as it happens.  Remove soap buildup in the tub or shower regularly.  Attach bath mats securely with double-sided non-slip rug tape.  Do not have throw rugs and other things on the floor that can make you trip. What can I do in the bedroom?  Use night lights.  Make sure that you have a light by your bed that is easy to reach.  Do not use any sheets or blankets that are too big for your bed. They should not hang down onto the floor.  Have a firm chair that has side arms. You can use this for support while you get dressed.  Do not have throw rugs and other things on the floor that can make you trip. What can I do in the kitchen?  Clean up any spills right away.  Avoid walking on wet floors.  Keep items that you use a lot in easy-to-reach places.  If you need to reach something above you, use a strong step stool that has a grab bar.  Keep electrical cords out of the way.  Do not use floor polish or wax that makes floors slippery. If you must use wax, use non-skid floor wax.  Do not have throw rugs and other things on the floor that can make you trip. What can I do with my stairs?  Do not leave any items on the stairs.  Make sure that there are handrails on both sides of the stairs and use them. Fix handrails that are broken or loose. Make sure that handrails are as long as the stairways.    Check any carpeting to make sure that it is firmly attached to the stairs. Fix any carpet that is loose or worn.  Avoid having throw rugs at the top or bottom of the stairs. If you do have throw rugs, attach them to the floor with carpet tape.  Make sure that you have a light switch at the top of the stairs and the bottom of the stairs. If you do not have them, ask someone to add them for you. What else can I do to help prevent  falls?  Wear shoes that:  Do not have high heels.  Have rubber bottoms.  Are comfortable and fit you well.  Are closed at the toe. Do not wear sandals.  If you use a stepladder:  Make sure that it is fully opened. Do not climb a closed stepladder.  Make sure that both sides of the stepladder are locked into place.  Ask someone to hold it for you, if possible.  Clearly mark and make sure that you can see:  Any grab bars or handrails.  First and last steps.  Where the edge of each step is.  Use tools that help you move around (mobility aids) if they are needed. These include:  Canes.  Walkers.  Scooters.  Crutches.  Turn on the lights when you go into a dark area. Replace any light bulbs as soon as they burn out.  Set up your furniture so you have a clear path. Avoid moving your furniture around.  If any of your floors are uneven, fix them.  If there are any pets around you, be aware of where they are.  Review your medicines with your doctor. Some medicines can make you feel dizzy. This can increase your chance of falling. Ask your doctor what other things that you can do to help prevent falls. This information is not intended to replace advice given to you by your health care provider. Make sure you discuss any questions you have with your health care provider. Document Released: 08/27/2009 Document Revised: 04/07/2016 Document Reviewed: 12/05/2014  2017 Elsevier   Hearing Loss Introduction Hearing loss is a partial or total loss of the ability to hear. This can be temporary or permanent, and it can happen in one or both ears. Hearing loss may be referred to as deafness. Medical care is necessary to treat hearing loss properly and to prevent the condition from getting worse. Your hearing may partially or completely come back, depending on what caused your hearing loss and how severe it is. In some cases, hearing loss is permanent. What are the causes? Common  causes of hearing loss include:  Too much wax in the ear canal.  Infection of the ear canal or middle ear.  Fluid in the middle ear.  Injury to the ear or surrounding area.  An object stuck in the ear.  Prolonged exposure to loud sounds, such as music. Less common causes of hearing loss include:  Tumors in the ear.  Viral or bacterial infections, such as meningitis.  A hole in the eardrum (perforated eardrum).  Problems with the hearing nerve that sends signals between the brain and the ear.  Certain medicines. What are the signs or symptoms? Symptoms of this condition may include:  Difficulty telling the difference between sounds.  Difficulty following a conversation when there is background noise.  Lack of response to sounds in your environment. This may be most noticeable when you do not respond to startling sounds.  Needing  to turn up the volume on the television, radio, etc.  Ringing in the ears.  Dizziness.  Pain in the ears. How is this diagnosed? This condition is diagnosed based on a physical exam and a hearing test (audiometry). The audiometry test will be performed by a hearing specialist (audiologist). You may also be referred to an ear, nose, and throat (ENT) specialist (otolaryngologist). How is this treated? Treatment for recent onset of hearing loss may include:  Ear wax removal.  Being prescribed medicines to prevent infection (antibiotics).  Being prescribed medicines to reduce inflammation (corticosteroids). Follow these instructions at home:  If you were prescribed an antibiotic medicine, take it as told by your health care provider. Do not stop taking the antibiotic even if you start to feel better.  Take over-the-counter and prescription medicines only as told by your health care provider.  Avoid loud noises.  Return to your normal activities as told by your health care provider. Ask your health care provider what activities are safe for  you.  Keep all follow-up visits as told by your health care provider. This is important. Contact a health care provider if:  You feel dizzy.  You develop new symptoms.  You vomit or feel nauseous.  You have a fever. Get help right away if:  You develop sudden changes in your vision.  You have severe ear pain.  You have new or increased weakness.  You have a severe headache. This information is not intended to replace advice given to you by your health care provider. Make sure you discuss any questions you have with your health care provider. Document Released: 10/31/2005 Document Revised: 04/07/2016 Document Reviewed: 03/18/2015  2017 Elsevier  

## 2016-11-08 ENCOUNTER — Telehealth: Payer: Self-pay

## 2016-11-08 NOTE — Telephone Encounter (Signed)
Call to Mr. Channing and does need AD, Will send out copy of cone's and can call for questions

## 2016-11-09 ENCOUNTER — Telehealth: Payer: Self-pay | Admitting: Family Medicine

## 2016-11-09 NOTE — Progress Notes (Signed)
Pre visit review using our clinic review tool, if applicable. No additional management support is needed unless otherwise documented below in the visit note.  Chief Complaint  Patient presents with  . Right Knee Pain    X1 week.    HPI: [name] 70 y.o.  Come in for sda pcp na this week  Onset about a week ago and getting worse of right knee pain that hurts when he walks and tender medially. No history of injury but bothered him after changing or working with the tire with his knee bent and also walking the steps at church. No specific falling. He used heat on it. He is not supposed to take ibuprofen because of his other medicines and medical conditions. Hx of same similar problem 9 months ago and saw Raliegh Ip at some point he had a cortisone injection which did help some. No Swelling fever  No bleeding  ROS: See pertinent positives and negatives per HPI.  Past Medical History:  Diagnosis Date  . Adenomatous polyps 03/2004, 11/2012  . Arthritis   . Coronary atherosclerosis of unspecified type of vessel, native or graft   . Esophagitis 1993  . GERD (gastroesophageal reflux disease)   . Hyperlipidemia   . Hypertension   . Myocardial infarction 1993  . Sleep apnea    cpap  . Stroke ([place]) 02/2014  . Trigger finger (acquired)   . 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 Stroke Mother   . COPD Father   . Heart disease Sister     valve replacement  . CAD Brother   . Heart attack Brother   . Colon cancer Neg Hx   . Esophageal cancer Neg Hx   . Liver disease Neg Hx   . Diabetes Neg Hx   . Hypertension Neg Hx     Social History   Social History  . Marital status: Married    Spouse name: N/A  . Number of children: 3  . Years of education: N/A   Occupational History  . 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 Smoking status: Former Smoker   Packs/day: 1.00    Years: 3.00    Types: Cigarettes  . Smokeless tobacco: Former Systems developer    Types: Chew     Comment: quit 50 years ago  . Alcohol use No  . Drug use: No  . Sexual activity: Not Asked   Other Topics Concern  . None   Social History Narrative   Married (wife patient elsewhere). 3 children- son is a patient here. 4 grandkids.       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 atenolol (TENORMIN) 25 MG tablet Take 1 tablet (25 mg total) by mouth daily. 90 tablet 3  . atorvastatin (LIPITOR) 10 MG tablet Take 1 tablet (10 mg total) by mouth daily. 90 tablet 3  . clopidogrel (PLAVIX) 75 MG tablet Take 1 tablet (75 mg total) by mouth daily. 90 tablet 3  . furosemide (LASIX) 20 MG tablet Take 1 tablet (20 mg total) by mouth daily as  needed for edema. 30 tablet 6  . mercaptopurine (PURINETHOL) 50 MG tablet TAKE 1 AND 1/2 TABLETS DAILY.GIVE ON AN EMPTY STOMACH 1 HOUR BEFORE OR 2 HOURS AFTER MEAL. 45 tablet 5  . nitroGLYCERIN (NITROSTAT) 0.4 MG SL tablet Place 1 tablet (0.4 mg total) under the tongue every 5 (five) minutes as needed for chest pain. 20 tablet 5  . pantoprazole (PROTONIX) 40 MG tablet Take 1 tablet (40 mg total) by mouth 2 (two) times daily. 180 tablet 1  . valsartan-hydrochlorothiazide (DIOVAN-HCT) 160-25 MG tablet Take 1 tablet by mouth daily. 90 tablet 3   No facility-administered medications prior to visit.      EXAM:  BP 126/74 (BP Location: Right Arm, Patient Position: Sitting, Cuff Size: Large)   Temp 97.8 F (36.6 C) (Oral)   Ht 5' 8"  (1.727 m)   Wt 220 lb (99.8 kg)   BMI 33.45 kg/m   Body mass index is 33.45 kg/m.  GENERAL: vitals reviewed and listed above, alert, oriented, appears  well hydrated and in no acute distress slow speech mild asymmetry to face  Independent gait  HEENT: atraumatic, conjunctiva  clear, no obvious abnormalities on inspection of external nose and ears MS: Right knee without warmth or redness. Mild swelling medial joint line tenderness. No crepitus noted. Range of motion normal when sitting. Left knee nontender. PSYCH: pleasant and cooperative,   ASSESSMENT AND PLAN:  Discussed the following assessment and plan:  Right medial knee pain  Primary osteoarthritis of both knees  CKD (chronic kidney disease), stage III Suspect flare of his arthritis versus bursitis. Can't take anti-inflammatory systemically because of medical conditions and medications. Discussed trying topical votaren  Vs  Other or  cortisone injection would opine to [employer] information given about the walking clinic. -Patient advised to return or notify health care team  if symptoms worsen ,persist or new concerns arise.  Patient Instructions  This acts like a flareup of arthritis of your  Right knee. I don't see signs of infection or acute injury but could've flared up from your activity kneeling and bending. Although you should take ibuprofen or Aleve by mouth.  Giving you a prescription for a topical gel anti-inflammatory to just put on your knee maximum 4 times a day to see if it helps the pain and put it in remission. I would have you follow-up with the [facility] when the clinic who evaluated your knee a while back. You can use the evening walk-in clinic they have every evening Monday through Friday. Or call for an appointment. Alternatively you can follow-up with Dr. Yong Channel.    Standley Brooking. Rohail Klees M.D.

## 2016-11-09 NOTE — Telephone Encounter (Signed)
Noted  

## 2016-11-09 NOTE — Telephone Encounter (Signed)
Patient Name: [name]  DOB: 01/27/1946    Initial Comment Caller is having a problem with his knee    Nurse Assessment  Nurse: Raphael Gibney, RN, Vera Date/Time (Eastern Time): 11/09/2016 11:10:25 AM  Confirm and document reason for call. If symptomatic, describe symptoms. ---Caller states he is having problems with his right knee. Has had pain about a week. Hurts to walk. No swelling. Had a cortisone injection in March. No injury. Wants to see about getting another cortisone injection.  Does the patient have any new or worsening symptoms? ---Yes  Will a triage be completed? ---Yes  Related visit to physician within the last 2 weeks? ---No  Does the PT have any chronic conditions? (i.e. diabetes, asthma, etc.) ---No  Is this a behavioral health or substance abuse call? ---No     Guidelines    Guideline Title Affirmed Question Affirmed Notes  Knee Pain [1] MODERATE pain (e.g., interferes with normal activities, limping) AND [2] present > 3 days    Final Disposition User   See PCP When Office is Open (within 3 days) Raphael Gibney, RN, Vanita Ingles    Comments  appt scheduled for 10:30 am 11/10/2016 with Dr. Shanon Ace   Referrals  REFERRED TO PCP OFFICE   Disagree/Comply: Comply

## 2016-11-10 ENCOUNTER — Ambulatory Visit (INDEPENDENT_AMBULATORY_CARE_PROVIDER_SITE_OTHER): Payer: Commercial Managed Care - HMO | Admitting: Internal Medicine

## 2016-11-10 ENCOUNTER — Encounter: Payer: Self-pay | Admitting: Internal Medicine

## 2016-11-10 ENCOUNTER — Telehealth: Payer: Self-pay | Admitting: Family Medicine

## 2016-11-10 VITALS — BP 126/74 | Temp 97.8°F | Ht 68.0 in | Wt 220.0 lb

## 2016-11-10 DIAGNOSIS — M170 Bilateral primary osteoarthritis of knee: Secondary | ICD-10-CM | POA: Diagnosis not present

## 2016-11-10 DIAGNOSIS — M25561 Pain in right knee: Secondary | ICD-10-CM

## 2016-11-10 DIAGNOSIS — N183 Chronic kidney disease, stage 3 unspecified: Secondary | ICD-10-CM

## 2016-11-10 MED ORDER — DICLOFENAC SODIUM 1 % TD GEL: 4.0000 g | Freq: Four times a day (QID) | TRANSDERMAL | 1 refills | Status: AC

## 2016-11-10 NOTE — Patient Instructions (Addendum)
This acts like a flareup of arthritis of your  Right knee. I don't see signs of infection or acute injury but could've flared up from your activity kneeling and bending. Although you should take ibuprofen or Aleve by mouth.  Giving you a prescription for a topical gel anti-inflammatory to just put on your knee maximum 4 times a day to see if it helps the pain and put it in remission. I would have you follow-up with the [facility] when the clinic who evaluated your knee a while back. You can use the evening walk-in clinic they have every evening Monday through Friday. Or call for an appointment. Alternatively you can follow-up with Dr. Yong Channel.

## 2016-11-10 NOTE — Telephone Encounter (Signed)
Pt needs a referral to see [facility] for knee pain. Please put referral in system. Pt saw dr Regis Bill today

## 2016-11-10 NOTE — Telephone Encounter (Signed)
Referral placed in the system. 

## 2016-11-11 ENCOUNTER — Other Ambulatory Visit: Payer: Self-pay | Admitting: Family Medicine

## 2016-11-14 DIAGNOSIS — R001 Bradycardia, unspecified: Secondary | ICD-10-CM

## 2016-11-14 HISTORY — DX: Bradycardia, unspecified: R00.1

## 2016-11-16 DIAGNOSIS — S83241A Other tear of medial meniscus, current injury, right knee, initial encounter: Secondary | ICD-10-CM | POA: Diagnosis not present

## 2016-11-23 DIAGNOSIS — R69 Illness, unspecified: Secondary | ICD-10-CM | POA: Diagnosis not present

## 2016-12-06 ENCOUNTER — Other Ambulatory Visit: Payer: Self-pay | Admitting: Family Medicine

## 2016-12-08 DIAGNOSIS — R69 Illness, unspecified: Secondary | ICD-10-CM | POA: Diagnosis not present

## 2016-12-08 DIAGNOSIS — K006 Disturbances in tooth eruption: Secondary | ICD-10-CM | POA: Diagnosis not present

## 2017-01-08 NOTE — Progress Notes (Signed)
Cardiology Office Note   Date:  01/09/2017   ID:  Jawon, Dipiero 1945-12-30, MRN [id number]  PCP:  Garret Reddish, MD  Cardiologist:   Dorris Carnes, MD    F/U of CAD     History of Present Illness: [name] is a 71 y.o. male with a history of CAD (MI in 1993)  Underwent PTCA to LAD  Nuclear scan in 2011 normal  Echo 2016 LVEF normal   I saw him in Feb 2017  Using CPAP  Fatigued    Rx 20 mg lasix prn    Since seen notes a little fatigue   Started taking lasix for a little swellng  Had it refilled a few times  Helps    Denies CP  Breatihg is OK  Doesn't exercise      Current Meds  Medication Sig  . atenolol (TENORMIN) 25 MG tablet TAKE 1 TABLET EVERY DAY  . atorvastatin (LIPITOR) 10 MG tablet TAKE 1 TABLET EVERY DAY  . clopidogrel (PLAVIX) 75 MG tablet TAKE 1 TABLET EVERY DAY  . furosemide (LASIX) 20 MG tablet Take 1 tablet (20 mg total) by mouth daily as needed for edema.  . mercaptopurine (PURINETHOL) 50 MG tablet TAKE 1 AND 1/2 TABLETS DAILY.GIVE ON AN EMPTY STOMACH 1 HOUR BEFORE OR 2 HOURS AFTER MEAL.  . nitroGLYCERIN (NITROSTAT) 0.4 MG SL tablet Place 1 tablet (0.4 mg total) under the tongue every 5 (five) minutes as needed for chest pain.  . pantoprazole (PROTONIX) 40 MG tablet Take 1 tablet (40 mg total) by mouth 2 (two) times daily.  . valsartan-hydrochlorothiazide (DIOVAN-HCT) 160-25 MG tablet TAKE 1 TABLET EVERY DAY     Allergies:   Penicillins   Past Medical History:  Diagnosis Date  . Adenomatous polyps 03/2004, 11/2012  . Arthritis   . Coronary atherosclerosis of unspecified type of vessel, native or graft   . Esophagitis 1993  . GERD (gastroesophageal reflux disease)   . Hyperlipidemia   . Hypertension   . Myocardial infarction 1993  . Sleep apnea    cpap  . Stroke ([place]) 02/2014  . Trigger finger (acquired)   . 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 ANGIOPLASTY   1993  . CHOLECYSTECTOMY  2002  . LUMBAR FUSION  1983  . ROTATOR CUFF REPAIR  2009   right  . TRIGGER FINGER RELEASE     right     Social History:  The patient  reports that he has quit smoking. His smoking use included Cigarettes. He has a 3.00 pack-year smoking history. He has quit using smokeless tobacco. His smokeless tobacco use included Chew. He reports that he does not drink alcohol or use drugs.   Family History:  The patient's family history includes CAD in his brother; COPD in his father; Heart attack in his brother; Heart disease in his sister; Stroke in his mother.    ROS:  Please see the history of present illness. All other systems are reviewed and  Negative to the above problem except as noted.    PHYSICAL EXAM: VS:  BP 132/74   Pulse 60   Ht 5' 8"  (1.727 m)   Wt 222 lb 6.4 oz (100.9 kg)   BMI 33.82 kg/m   GEN: Well nourished, well developed, in no acute distress  HEENT: normal  Neck: no JVD, carotid bruits, or masses Cardiac: RRR; no murmurs, rubs, or gallops,no edema  Respiratory:  clear to auscultation bilaterally, normal work of breathing GI: soft, nontender, nondistended, + BS  No hepatomegaly  MS: no deformity Moving all extremities   Skin: warm and dry, no rash Neuro:  Strength and sensation are intact Psych: euthymic mood, full affect   EKG:  EKG is ordered today.  SB 59 bpm  r S R' V1     Lipid Panel    Component Value Date/Time   CHOL 113 09/13/2016 0930   TRIG 155.0 (H) 09/13/2016 0930   TRIG 88 10/20/2006 0927   HDL 29.30 (L) 09/13/2016 0930   CHOLHDL 4 09/13/2016 0930   VLDL 31.0 09/13/2016 0930   LDLCALC 53 09/13/2016 0930      Wt Readings from Last 3 Encounters:  01/09/17 222 lb 6.4 oz (100.9 kg)  11/10/16 220 lb (99.8 kg)  10/18/16 217 lb (98.4 kg)      ASSESSMENT AND PLAN:  1  CAD   No sympoms of angina  Keep on same regimen  2  HL  LDL in Oct 2017 was 53  HDL 29    Total 113    3  HTN  BP is good  Check K  4  HCM   Encouraged him to walk 25 min per day or 10 min more vigours  5  K  Check  Has been a little low    F/U in 1 year    Current medicines are reviewed at length with the patient today.  The patient does not have concerns regarding medicines.  Signed, Dorris Carnes, MD  01/09/2017 8:21 AM    [facility] [place], [place], [place]  [address] Phone: [phone]; Fax: [phone]

## 2017-01-09 ENCOUNTER — Encounter: Payer: Self-pay | Admitting: Internal Medicine

## 2017-01-09 ENCOUNTER — Encounter (INDEPENDENT_AMBULATORY_CARE_PROVIDER_SITE_OTHER): Payer: Self-pay

## 2017-01-09 ENCOUNTER — Ambulatory Visit (INDEPENDENT_AMBULATORY_CARE_PROVIDER_SITE_OTHER): Payer: Medicare HMO | Admitting: Internal Medicine

## 2017-01-09 VITALS — BP 132/74 | HR 60 | Ht 68.0 in | Wt 222.4 lb

## 2017-01-09 DIAGNOSIS — I10 Essential (primary) hypertension: Secondary | ICD-10-CM | POA: Diagnosis not present

## 2017-01-09 LAB — BASIC METABOLIC PANEL
BUN / CREAT RATIO: 11 (ref 10–24)
BUN: 14 mg/dL (ref 8–27)
CALCIUM: 9.2 mg/dL (ref 8.6–10.2)
CO2: 26 mmol/L (ref 18–29)
CREATININE: 1.3 mg/dL — AB (ref 0.76–1.27)
Chloride: 96 mmol/L (ref 96–106)
GFR calc non Af Amer: 56 mL/min/{1.73_m2} — ABNORMAL LOW (ref >59)
GFR, EST AFRICAN AMERICAN: 64 mL/min/{1.73_m2} (ref >59)
Glucose: 101 mg/dL — ABNORMAL HIGH (ref 65–99)
Potassium: 3.3 mmol/L — ABNORMAL LOW (ref 3.5–5.2)
Sodium: 139 mmol/L (ref 134–144)

## 2017-01-09 NOTE — Patient Instructions (Signed)
Your physician recommends that you return for lab work today Artist)  Your physician recommends that you continue on your current medications as directed. Please refer to the Current Medication list given to you today.  Your physician wants you to follow-up in: 1 year with Dr. Harrington Challenger.  You will receive a reminder letter in the mail two months in advance. If you don't receive a letter, please call our office to schedule the follow-up appointment.

## 2017-01-11 ENCOUNTER — Telehealth: Payer: Self-pay | Admitting: Internal Medicine

## 2017-01-11 DIAGNOSIS — I10 Essential (primary) hypertension: Secondary | ICD-10-CM

## 2017-01-11 DIAGNOSIS — I2510 Atherosclerotic heart disease of native coronary artery without angina pectoris: Secondary | ICD-10-CM

## 2017-01-11 NOTE — Telephone Encounter (Signed)
Patient did not answer. Left a message to call back for lab results.

## 2017-01-11 NOTE — Telephone Encounter (Signed)
-----   Message from [place], MD sent at 01/11/2017 12:35 AM EST ----- Pt should be on KCL 10 mEq per day Check BMET in 2 wks   With this check Magnesium

## 2017-01-11 NOTE — Telephone Encounter (Signed)
New Message   Pt states someone named "[name]" or "[name]" called him today and he was calling back. I advised him that someone would f/u with him.

## 2017-01-12 ENCOUNTER — Other Ambulatory Visit: Payer: Self-pay | Admitting: *Deleted

## 2017-01-12 MED ORDER — POTASSIUM CHLORIDE ER 10 MEQ PO TBCR: 10.0000 meq | EXTENDED_RELEASE_TABLET | Freq: Every day | ORAL | 3 refills | Status: DC

## 2017-01-12 NOTE — Telephone Encounter (Signed)
Informed patient.  He would like 30 day supply to [facility] and then a prescription to [facility].   Appointment scheduled for lab on 3/16.  Pt is only taking lasix as needed, not daily.  Medication is correct in list.

## 2017-01-12 NOTE — Telephone Encounter (Signed)
Follow Up:    Pt's returning call from yesterday,concerning his lab results.

## 2017-01-27 ENCOUNTER — Other Ambulatory Visit: Payer: Medicare HMO | Admitting: *Deleted

## 2017-01-27 DIAGNOSIS — I2510 Atherosclerotic heart disease of native coronary artery without angina pectoris: Secondary | ICD-10-CM

## 2017-01-27 DIAGNOSIS — I10 Essential (primary) hypertension: Secondary | ICD-10-CM

## 2017-01-28 LAB — BASIC METABOLIC PANEL
BUN/Creatinine Ratio: 11 (ref 10–24)
BUN: 13 mg/dL (ref 8–27)
CALCIUM: 9.5 mg/dL (ref 8.6–10.2)
CHLORIDE: 99 mmol/L (ref 96–106)
CO2: 29 mmol/L (ref 18–29)
CREATININE: 1.23 mg/dL (ref 0.76–1.27)
GFR, EST AFRICAN AMERICAN: 69 mL/min/{1.73_m2} (ref >59)
GFR, EST NON AFRICAN AMERICAN: 60 mL/min/{1.73_m2} (ref >59)
Glucose: 96 mg/dL (ref 65–99)
Potassium: 3.8 mmol/L (ref 3.5–5.2)
Sodium: 141 mmol/L (ref 134–144)

## 2017-01-28 LAB — MAGNESIUM: Magnesium: 1.9 mg/dL (ref 1.6–2.3)

## 2017-02-18 ENCOUNTER — Other Ambulatory Visit: Payer: Self-pay | Admitting: Gastroenterology

## 2017-02-20 ENCOUNTER — Telehealth: Payer: Self-pay | Admitting: Gastroenterology

## 2017-02-20 MED ORDER — MERCAPTOPURINE 50 MG PO TABS: ORAL_TABLET | ORAL | 0 refills | Status: DC

## 2017-02-20 NOTE — Telephone Encounter (Signed)
Prescription sent to patient's pharmacy until scheduled appt.

## 2017-02-23 ENCOUNTER — Other Ambulatory Visit: Payer: Self-pay | Admitting: Gastroenterology

## 2017-02-28 DIAGNOSIS — G4733 Obstructive sleep apnea (adult) (pediatric): Secondary | ICD-10-CM | POA: Diagnosis not present

## 2017-03-06 ENCOUNTER — Encounter: Payer: Self-pay | Admitting: Family Medicine

## 2017-03-06 ENCOUNTER — Ambulatory Visit (INDEPENDENT_AMBULATORY_CARE_PROVIDER_SITE_OTHER): Payer: Medicare HMO | Admitting: Family Medicine

## 2017-03-06 VITALS — BP 110/76 | HR 54 | Temp 98.0°F | Ht 68.0 in | Wt 220.8 lb

## 2017-03-06 DIAGNOSIS — N481 Balanitis: Secondary | ICD-10-CM | POA: Diagnosis not present

## 2017-03-06 DIAGNOSIS — I10 Essential (primary) hypertension: Secondary | ICD-10-CM

## 2017-03-06 DIAGNOSIS — R739 Hyperglycemia, unspecified: Secondary | ICD-10-CM

## 2017-03-06 DIAGNOSIS — K5150 Left sided colitis without complications: Secondary | ICD-10-CM

## 2017-03-06 DIAGNOSIS — E7800 Pure hypercholesterolemia, unspecified: Secondary | ICD-10-CM

## 2017-03-06 MED ORDER — ACYCLOVIR 5 % EX OINT: TOPICAL_OINTMENT | CUTANEOUS | 2 refills | Status: DC

## 2017-03-06 NOTE — Progress Notes (Signed)
Pre visit review using our clinic review tool, if applicable. No additional management support is needed unless otherwise documented below in the visit note. 

## 2017-03-06 NOTE — Assessment & Plan Note (Signed)
S: well controlled on atorvastatin 59m. No myalgias.  Lab Results  Component Value Date   CHOL 113 09/13/2016   HDL 29.30 (L) 09/13/2016   LDLCALC 53 09/13/2016   TRIG 155.0 (H) 09/13/2016   CHOLHDL 4 09/13/2016   A/P: continue current meds

## 2017-03-06 NOTE — Progress Notes (Signed)
Subjective:  [name] is a 71 y.o. year old very pleasant male patient who presents for/with See problem oriented charting ROS- intermittent ulcers on foreskin ofpenis. No chest pain shortness of breath. Stable trace edema   Past Medical History-  Patient Active Problem List   Diagnosis Date Noted  . ULCERATIVE COLITIS-LEFT SIDE 07/03/2008    Priority: High  . CAD (coronary artery disease) 06/15/2007    Priority: High  . Hyperglycemia 06/08/2015    Priority: Medium  . CKD (chronic kidney disease), stage III 10/24/2014    Priority: Medium  . Sleep apnea 07/02/2008    Priority: Medium  . Hyperlipidemia 06/14/2007    Priority: Medium  . Essential hypertension 06/14/2007    Priority: Medium  . Balanitis 03/06/2017    Priority: Low  . OA (osteoarthritis) of knee 12/07/2015    Priority: Low  . Former smoker 06/08/2015    Priority: Low  . Edema 02/04/2015    Priority: Low  . Allergic rhinitis 10/24/2014    Priority: Low  . Obesity (BMI 30-39.9) 04/25/2014    Priority: Low  . Right cervical radiculopathy 04/12/2013    Priority: Low  . Low back pain 01/11/2008    Priority: Low  . GERD 11/07/2007    Priority: Low  . Trigger finger, acquired 10/23/2007    Priority: Low  . History of colonic polyps 06/14/2007    Priority: Low    Medications- reviewed and updated Current Outpatient Prescriptions  Medication Sig Dispense Refill  . atenolol (TENORMIN) 25 MG tablet TAKE 1 TABLET EVERY DAY 90 tablet 3  . atorvastatin (LIPITOR) 10 MG tablet TAKE 1 TABLET EVERY DAY 90 tablet 3  . clopidogrel (PLAVIX) 75 MG tablet TAKE 1 TABLET EVERY DAY 90 tablet 3  . furosemide (LASIX) 20 MG tablet Take 1 tablet (20 mg total) by mouth daily as needed for edema. 30 tablet 6  . mercaptopurine (PURINETHOL) 50 MG tablet TAKE 1 AND 1/2 TABLETS DAILY.GIVE ON AN EMPTY STOMACH 1 HOUR BEFORE OR 2 HOURS AFTER MEAL. 45 tablet 0  . nitroGLYCERIN (NITROSTAT) 0.4 MG SL tablet Place 1 tablet (0.4 mg  total) under the tongue every 5 (five) minutes as needed for chest pain. 20 tablet 5  . pantoprazole (PROTONIX) 40 MG tablet TAKE 1 TABLET TWICE DAILY 180 tablet 0  . potassium chloride (K-DUR) 10 MEQ tablet Take 1 tablet (10 mEq total) by mouth daily. 30 tablet 3  . valsartan-hydrochlorothiazide (DIOVAN-HCT) 160-25 MG tablet TAKE 1 TABLET EVERY DAY 90 tablet 3  . acyclovir ointment (ZOVIRAX) 5 % Apply topically every 3 (three) hours. 15 g 2   No current facility-administered medications for this visit.     Objective: BP 110/76 (BP Location: Left Arm, Patient Position: Sitting, Cuff Size: Large)   Pulse (!) 54   Temp 98 F (36.7 C) (Oral)   Ht 5' 8"  (1.727 m)   Wt 220 lb 12.8 oz (100.2 kg)   SpO2 97%   BMI 33.57 kg/m  Gen: NAD, resting comfortably CV: RRR no murmurs rubs or gallops Lungs: CTAB no crackles, wheeze, rhonchi Abdomen: soft/nontender/nondistended/normal bowel sounds. obese Ext: trace edema Skin: warm, dry Neuro: grossly normal, moves all extremities  Assessment/Plan:  Essential hypertension S: controlled on valsartan 160-3m, atenolol 238mBP Readings from Last 3 Encounters:  03/06/17 110/76  01/09/17 132/74  11/10/16 126/74  A/P:Continue current meds  Hyperlipidemia S: well controlled on atorvastatin 1053mNo myalgias.  Lab Results  Component Value Date   CHOL 113  09/13/2016   HDL 29.30 (L) 09/13/2016   LDLCALC 53 09/13/2016   TRIG 155.0 (H) 09/13/2016   CHOLHDL 4 09/13/2016   A/P: continue current meds  Hyperglycemia S:  At risk for diabetes with last a1c Lab Results  Component Value Date   HGBA1C 6.0 09/13/2016  he was to work on weight loss but weight largely stable Wt Readings from Last 3 Encounters:  03/06/17 220 lb 12.8 oz (100.2 kg)  01/09/17 222 lb 6.4 oz (100.9 kg)  11/10/16 220 lb (99.8 kg)  A/P: we discussed working on lowering risk of diabetes. Likely update a1c next visit  Balanitis Under Dr. Arnoldo Morale- topical acyclovir for  blisters on foreskin (uncircumcised). I agreed to refill though we discussed this could mean patient having intermittent genital herpes breakouts. He already uses protection fortunately  ULCERATIVE COLITIS-LEFT SIDE Has upcominv visit with Dr. Fuller Plan. Has been controlled on mercaptopurine. No bloody stool, no abdominal pain.    Return in about 28 weeks (around 09/18/2017) for physical.  Meds ordered this encounter  Medications  . acyclovir ointment (ZOVIRAX) 5 %    Sig: Apply topically every 3 (three) hours.    Dispense:  15 g    Refill:  2    Return precautions advised.  Garret Reddish, MD

## 2017-03-06 NOTE — Assessment & Plan Note (Signed)
Has upcominv visit with Dr. Fuller Plan. Has been controlled on mercaptopurine. No bloody stool, no abdominal pain.

## 2017-03-06 NOTE — Assessment & Plan Note (Signed)
Under Dr. Arnoldo Morale- topical acyclovir for blisters on foreskin (uncircumcised). I agreed to refill though we discussed this could mean patient having intermittent genital herpes breakouts. He already uses protection fortunately

## 2017-03-06 NOTE — Assessment & Plan Note (Signed)
S: controlled on valsartan 160-2m, atenolol 261mBP Readings from Last 3 Encounters:  03/06/17 110/76  01/09/17 132/74  11/10/16 126/74  A/P:Continue current meds

## 2017-03-06 NOTE — Assessment & Plan Note (Signed)
S:  At risk for diabetes with last a1c Lab Results  Component Value Date   HGBA1C 6.0 09/13/2016  he was to work on weight loss but weight largely stable Wt Readings from Last 3 Encounters:  03/06/17 220 lb 12.8 oz (100.2 kg)  01/09/17 222 lb 6.4 oz (100.9 kg)  11/10/16 220 lb (99.8 kg)  A/P: we discussed working on lowering risk of diabetes. Likely update a1c next visit

## 2017-03-06 NOTE — Patient Instructions (Addendum)
No changes today  Refilled acyclovir  ______________________________________________________________________  Starting October 1st 2018, I will be transferring to our new location: [facility] (corner of [address] and [address] from [employer]) [place], [place] Phone: [phone]  I would love to have you remain my patient at this new location as long as it remains convenient for you. I am excited about the opportunity to have x-ray and sports medicine in the new building but will really miss the awesome staff and physicians at [facility]. Continue to schedule appointments at [facility] and we will automatically transfer them to the [place] location starting October 1st.

## 2017-03-07 ENCOUNTER — Telehealth: Payer: Self-pay

## 2017-03-07 NOTE — Telephone Encounter (Signed)
Received PA request from [facility] for Acyclovir 5% ointment. PA submitted & is pending. Key: Luz Brazen

## 2017-03-09 NOTE — Telephone Encounter (Signed)
Please inform patient. He could pay for it out of pocket if he wants. May also try something as simple as neosporin. Or we could test with next flare to see if herpetic

## 2017-03-09 NOTE — Telephone Encounter (Signed)
Spoke with patient who verbalized understanding.

## 2017-03-09 NOTE — Telephone Encounter (Signed)
PA denied, Medicare considers balanitis an off label use and will not cover the drug.

## 2017-03-14 DIAGNOSIS — R69 Illness, unspecified: Secondary | ICD-10-CM | POA: Diagnosis not present

## 2017-03-20 ENCOUNTER — Ambulatory Visit (INDEPENDENT_AMBULATORY_CARE_PROVIDER_SITE_OTHER): Payer: Medicare HMO | Admitting: Gastroenterology

## 2017-03-20 ENCOUNTER — Other Ambulatory Visit (INDEPENDENT_AMBULATORY_CARE_PROVIDER_SITE_OTHER): Payer: Medicare HMO

## 2017-03-20 ENCOUNTER — Encounter: Payer: Self-pay | Admitting: Gastroenterology

## 2017-03-20 VITALS — BP 130/70 | HR 68 | Ht 68.0 in | Wt 220.5 lb

## 2017-03-20 DIAGNOSIS — Z86010 Personal history of colonic polyps: Secondary | ICD-10-CM | POA: Diagnosis not present

## 2017-03-20 DIAGNOSIS — K5150 Left sided colitis without complications: Secondary | ICD-10-CM | POA: Diagnosis not present

## 2017-03-20 LAB — COMPREHENSIVE METABOLIC PANEL
ALT: 32 U/L (ref 0–53)
AST: 22 U/L (ref 0–37)
Albumin: 4.2 g/dL (ref 3.5–5.2)
Alkaline Phosphatase: 61 U/L (ref 39–117)
BILIRUBIN TOTAL: 0.7 mg/dL (ref 0.2–1.2)
BUN: 10 mg/dL (ref 6–23)
CALCIUM: 9.9 mg/dL (ref 8.4–10.5)
CHLORIDE: 99 meq/L (ref 96–112)
CO2: 31 meq/L (ref 19–32)
Creatinine, Ser: 1.26 mg/dL (ref 0.40–1.50)
GFR: 60.16 mL/min (ref >60.00)
Glucose, Bld: 90 mg/dL (ref 70–99)
POTASSIUM: 3.4 meq/L — AB (ref 3.5–5.1)
Sodium: 137 mEq/L (ref 135–145)
Total Protein: 7.6 g/dL (ref 6.0–8.3)

## 2017-03-20 LAB — CBC WITH DIFFERENTIAL/PLATELET
BASOS ABS: 0.1 10*3/uL (ref 0.0–0.1)
Basophils Relative: 0.9 % (ref 0.0–3.0)
EOS PCT: 2.7 % (ref 0.0–5.0)
Eosinophils Absolute: 0.2 10*3/uL (ref 0.0–0.7)
HCT: 42.9 % (ref 39.0–52.0)
HEMOGLOBIN: 14.3 g/dL (ref 13.0–17.0)
LYMPHS ABS: 2 10*3/uL (ref 0.7–4.0)
Lymphocytes Relative: 30.3 % (ref 12.0–46.0)
MCHC: 33.5 g/dL (ref 30.0–36.0)
MCV: 86.3 fl (ref 78.0–100.0)
MONO ABS: 0.8 10*3/uL (ref 0.1–1.0)
Monocytes Relative: 12.2 % — ABNORMAL HIGH (ref 3.0–12.0)
NEUTROS PCT: 53.9 % (ref 43.0–77.0)
Neutro Abs: 3.5 10*3/uL (ref 1.4–7.7)
Platelets: 222 10*3/uL (ref 150.0–400.0)
RBC: 4.97 Mil/uL (ref 4.22–5.81)
RDW: 16.3 % — ABNORMAL HIGH (ref 11.5–15.5)
WBC: 6.5 10*3/uL (ref 4.0–10.5)

## 2017-03-20 LAB — HEPATIC FUNCTION PANEL
ALT: 32 U/L (ref 0–53)
AST: 22 U/L (ref 0–37)
Albumin: 4.2 g/dL (ref 3.5–5.2)
Alkaline Phosphatase: 61 U/L (ref 39–117)
BILIRUBIN DIRECT: 0.1 mg/dL (ref 0.0–0.3)
BILIRUBIN TOTAL: 0.7 mg/dL (ref 0.2–1.2)
Total Protein: 7.6 g/dL (ref 6.0–8.3)

## 2017-03-20 LAB — TSH: TSH: 0.95 u[IU]/mL (ref 0.35–4.50)

## 2017-03-20 LAB — LIPASE: Lipase: 28 U/L (ref 11.0–59.0)

## 2017-03-20 MED ORDER — MERCAPTOPURINE 50 MG PO TABS: ORAL_TABLET | ORAL | 5 refills | Status: DC

## 2017-03-20 NOTE — Progress Notes (Signed)
    History of Present Illness: This is a 71 year old male returning for follow-up of left sided ulcerative colitis. He has been asymptomatic maintained on 6-MP. BMET in 01/2017 was normal and HCV negative in 09/2016. He complains of increasing hair loss.   Current Medications, Allergies, Past Medical History, Past Surgical History, Family History and Social History were reviewed in [employer] record.  Physical Exam: General: Well developed, well nourished, no acute distress Head: Normocephalic and atraumatic Eyes:  sclerae anicteric, EOMI Ears: Normal auditory acuity Mouth: No deformity or lesions Lungs: Clear throughout to auscultation Heart: Regular rate and rhythm; no murmurs, rubs or bruits Abdomen: Soft, non tender and non distended. No masses, hepatosplenomegaly or hernias noted. Normal Bowel sounds Musculoskeletal: Symmetrical with no gross deformities  Pulses:  Normal pulses noted Extremities: No clubbing, cyanosis, edema or deformities noted Neurological: Alert oriented x 4, grossly nonfocal Psychological:  Alert and cooperative. Normal mood and affect  Assessment and Recommendations:  1. Left sided UC. Stable on 6 MP 75 mg daily, refill for 6 months. CBC, LFTs, lipase, TSH today. REV in 6 months. Colonoscopy recommended in January 2019.  2. Personal history of adenomatous colon polyps. Colonoscopy as above.

## 2017-03-20 NOTE — Patient Instructions (Signed)
We have sent the following medications to your pharmacy for you to pick up at your convenience:mercaptopurine.   Your physician has requested that you go to the basement for lab work before leaving today.  Thank you for choosing me and [facility].  Pricilla Riffle. Dagoberto Ligas., MD., Marval Regal

## 2017-05-06 ENCOUNTER — Other Ambulatory Visit: Payer: Self-pay | Admitting: Gastroenterology

## 2017-06-02 DIAGNOSIS — H538 Other visual disturbances: Secondary | ICD-10-CM | POA: Diagnosis not present

## 2017-06-02 DIAGNOSIS — H2511 Age-related nuclear cataract, right eye: Secondary | ICD-10-CM | POA: Diagnosis not present

## 2017-06-02 DIAGNOSIS — H43812 Vitreous degeneration, left eye: Secondary | ICD-10-CM | POA: Diagnosis not present

## 2017-06-12 ENCOUNTER — Telehealth: Payer: Self-pay | Admitting: Family Medicine

## 2017-06-12 NOTE — Telephone Encounter (Signed)
Pt would like to know what Dr would switch him to and should he stop his  valsartan-hydrochlorothiazide (DIOVAN-HCT) 160-25 MG tablet  [place], [place]

## 2017-06-13 NOTE — Telephone Encounter (Signed)
Yes have him stop medicine when he picks up this new medicine- lets do combination irbesartan hctz 150/12.23m #90 with 3 refills and schedule him for follow up visit in 3-4 weeks. Please take old valsartan off list

## 2017-06-14 ENCOUNTER — Other Ambulatory Visit: Payer: Self-pay

## 2017-06-14 MED ORDER — IRBESARTAN-HYDROCHLOROTHIAZIDE 150-12.5 MG PO TABS: 1.0000 | ORAL_TABLET | Freq: Every day | ORAL | 0 refills | Status: DC

## 2017-06-14 MED ORDER — IRBESARTAN-HYDROCHLOROTHIAZIDE 150-12.5 MG PO TABS: 1.0000 | ORAL_TABLET | Freq: Every day | ORAL | 3 refills | Status: DC

## 2017-06-14 NOTE — Telephone Encounter (Signed)
Spoke with patient who verbalized understanding and I scheduled him for a follow up appointment. I also sent in a 30 day supply to his local pharmacy and a 90 day to mail order per his request.

## 2017-07-12 ENCOUNTER — Ambulatory Visit: Payer: Medicare HMO | Admitting: Family Medicine

## 2017-07-13 ENCOUNTER — Ambulatory Visit (INDEPENDENT_AMBULATORY_CARE_PROVIDER_SITE_OTHER): Payer: Medicare HMO | Admitting: Family Medicine

## 2017-07-13 ENCOUNTER — Encounter: Payer: Self-pay | Admitting: Family Medicine

## 2017-07-13 DIAGNOSIS — R739 Hyperglycemia, unspecified: Secondary | ICD-10-CM

## 2017-07-13 DIAGNOSIS — I10 Essential (primary) hypertension: Secondary | ICD-10-CM | POA: Diagnosis not present

## 2017-07-13 NOTE — Progress Notes (Signed)
Subjective:  [name] is a 71 y.o. year old very pleasant male patient who presents for/with See problem oriented charting ROS- No chest pain or shortness of breath. No headache or blurry vision.    Past Medical History-  Patient Active Problem List   Diagnosis Date Noted  . ULCERATIVE COLITIS-LEFT SIDE 07/03/2008    Priority: High  . CAD (coronary artery disease) 06/15/2007    Priority: High  . Hyperglycemia 06/08/2015    Priority: Medium  . CKD (chronic kidney disease), stage III 10/24/2014    Priority: Medium  . Sleep apnea 07/02/2008    Priority: Medium  . Hyperlipidemia 06/14/2007    Priority: Medium  . Essential hypertension 06/14/2007    Priority: Medium  . Balanitis 03/06/2017    Priority: Low  . OA (osteoarthritis) of knee 12/07/2015    Priority: Low  . Former smoker 06/08/2015    Priority: Low  . Edema 02/04/2015    Priority: Low  . Allergic rhinitis 10/24/2014    Priority: Low  . Obesity (BMI 30-39.9) 04/25/2014    Priority: Low  . Right cervical radiculopathy 04/12/2013    Priority: Low  . Low back pain 01/11/2008    Priority: Low  . GERD 11/07/2007    Priority: Low  . Trigger finger, acquired 10/23/2007    Priority: Low  . History of colonic polyps 06/14/2007    Priority: Low    Medications- reviewed and updated Current Outpatient Prescriptions  Medication Sig Dispense Refill  . acyclovir ointment (ZOVIRAX) 5 % Apply topically every 3 (three) hours. 15 g 2  . atenolol (TENORMIN) 25 MG tablet TAKE 1 TABLET EVERY DAY 90 tablet 3  . atorvastatin (LIPITOR) 10 MG tablet TAKE 1 TABLET EVERY DAY 90 tablet 3  . clopidogrel (PLAVIX) 75 MG tablet TAKE 1 TABLET EVERY DAY 90 tablet 3  . furosemide (LASIX) 20 MG tablet Take 1 tablet (20 mg total) by mouth daily as needed for edema. 30 tablet 6  . irbesartan-hydrochlorothiazide (AVALIDE) 150-12.5 MG tablet Take 1 tablet by mouth daily. 90 tablet 3  . mercaptopurine (PURINETHOL) 50 MG tablet TAKE 1 AND  1/2 TABLETS DAILY.GIVE ON AN EMPTY STOMACH 1 HOUR BEFORE OR 2 HOURS AFTER MEAL. 45 tablet 5  . nitroGLYCERIN (NITROSTAT) 0.4 MG SL tablet Place 1 tablet (0.4 mg total) under the tongue every 5 (five) minutes as needed for chest pain. 20 tablet 5  . pantoprazole (PROTONIX) 40 MG tablet TAKE 1 TABLET TWICE DAILY 180 tablet 2  . potassium chloride (K-DUR) 10 MEQ tablet Take 1 tablet (10 mEq total) by mouth daily. 30 tablet 3   No current facility-administered medications for this visit.     Objective: BP 138/72 (BP Location: Left Arm, Patient Position: Sitting, Cuff Size: Large)   Pulse (!) 50   Temp 97.8 F (36.6 C) (Oral)   Ht 5' 8"  (1.727 m)   Wt 218 lb 12.8 oz (99.2 kg)   SpO2 97%   BMI 33.27 kg/m  Gen: NAD, resting comfortably CV: RRR no murmurs rubs or gallops Lungs: CTAB no crackles, wheeze, rhonchi Abdomen: soft/nontender/nondistended except obese Neuro: normal gait  Assessment/Plan:  Essential hypertension S: controlled on valsartan-hct 160-68m, atenolol 221mpreviously- Bp slightly higher on irbesartan 150-12.87m26mno combo with 150 mg and 287m51mt) BP Readings from Last 3 Encounters:  07/13/17 138/72  03/20/17 130/70  03/06/17 110/76  A/P: We discussed blood pressure goal of <140/90. Continue current meds   Hyperglycemia S: has lost 2 lbs  Lab Results  Component Value Date   HGBA1C 6.0 09/13/2016  A/P: discussed increased diabetes risk and wanting to reduce this. Advised 5 lbs weight loss by follow up.   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 SK.   Return precautions advised.  Garret Reddish, MD

## 2017-07-13 NOTE — Assessment & Plan Note (Signed)
S: has lost 2 lbs Lab Results  Component Value Date   HGBA1C 6.0 09/13/2016  A/P: discussed increased diabetes risk and wanting to reduce this. Advised 5 lbs weight loss by follow up.

## 2017-07-13 NOTE — Assessment & Plan Note (Signed)
S: controlled on valsartan-hct 160-24m, atenolol 289mpreviously- Bp slightly higher on irbesartan 150-12.9m28mno combo with 150 mg and 29m110mt) BP Readings from Last 3 Encounters:  07/13/17 138/72  03/20/17 130/70  03/06/17 110/76  A/P: We discussed blood pressure goal of <140/90. Continue current meds

## 2017-07-13 NOTE — Patient Instructions (Signed)
Blood pressure looks ok today on new medicine. Continue and follow up at physical  Want you to work on weight loss- would love to see at least 5 lbs off by follow up

## 2017-08-03 ENCOUNTER — Telehealth: Payer: Self-pay | Admitting: Family Medicine

## 2017-08-03 NOTE — Telephone Encounter (Signed)
Left pt message asking to call Ebony Hail back directly at [phone] to schedule AWV + labs with Cassie.   CPE with PCP scheduled 10/11/17  *NOTE* Last AWV 10/18/16; please schedule 10/19/17 or after

## 2017-08-21 ENCOUNTER — Other Ambulatory Visit: Payer: Self-pay | Admitting: Gastroenterology

## 2017-09-12 ENCOUNTER — Other Ambulatory Visit: Payer: Self-pay | Admitting: Family Medicine

## 2017-09-18 ENCOUNTER — Ambulatory Visit (INDEPENDENT_AMBULATORY_CARE_PROVIDER_SITE_OTHER): Payer: Medicare HMO | Admitting: Family Medicine

## 2017-09-18 ENCOUNTER — Encounter: Payer: Self-pay | Admitting: Family Medicine

## 2017-09-18 VITALS — BP 136/82 | HR 57 | Temp 97.7°F | Ht 68.0 in | Wt 214.0 lb

## 2017-09-18 DIAGNOSIS — Z87891 Personal history of nicotine dependence: Secondary | ICD-10-CM

## 2017-09-18 DIAGNOSIS — K219 Gastro-esophageal reflux disease without esophagitis: Secondary | ICD-10-CM | POA: Diagnosis not present

## 2017-09-18 DIAGNOSIS — Z125 Encounter for screening for malignant neoplasm of prostate: Secondary | ICD-10-CM | POA: Diagnosis not present

## 2017-09-18 DIAGNOSIS — I10 Essential (primary) hypertension: Secondary | ICD-10-CM | POA: Diagnosis not present

## 2017-09-18 DIAGNOSIS — Z0000 Encounter for general adult medical examination without abnormal findings: Secondary | ICD-10-CM | POA: Diagnosis not present

## 2017-09-18 DIAGNOSIS — G4730 Sleep apnea, unspecified: Secondary | ICD-10-CM | POA: Diagnosis not present

## 2017-09-18 DIAGNOSIS — Z860100 Personal history of colon polyps, unspecified: Secondary | ICD-10-CM

## 2017-09-18 DIAGNOSIS — R739 Hyperglycemia, unspecified: Secondary | ICD-10-CM

## 2017-09-18 DIAGNOSIS — N183 Chronic kidney disease, stage 3 unspecified: Secondary | ICD-10-CM

## 2017-09-18 DIAGNOSIS — E785 Hyperlipidemia, unspecified: Secondary | ICD-10-CM | POA: Diagnosis not present

## 2017-09-18 DIAGNOSIS — I2510 Atherosclerotic heart disease of native coronary artery without angina pectoris: Secondary | ICD-10-CM

## 2017-09-18 DIAGNOSIS — K5150 Left sided colitis without complications: Secondary | ICD-10-CM

## 2017-09-18 LAB — COMPREHENSIVE METABOLIC PANEL
ALT: 23 U/L (ref 0–53)
AST: 17 U/L (ref 0–37)
Albumin: 4.1 g/dL (ref 3.5–5.2)
Alkaline Phosphatase: 70 U/L (ref 39–117)
BUN: 13 mg/dL (ref 6–23)
CHLORIDE: 100 meq/L (ref 96–112)
CO2: 31 meq/L (ref 19–32)
CREATININE: 1.23 mg/dL (ref 0.40–1.50)
Calcium: 9.8 mg/dL (ref 8.4–10.5)
GFR: 61.77 mL/min (ref >60.00)
GLUCOSE: 98 mg/dL (ref 70–99)
POTASSIUM: 4 meq/L (ref 3.5–5.1)
SODIUM: 139 meq/L (ref 135–145)
Total Bilirubin: 0.9 mg/dL (ref 0.2–1.2)
Total Protein: 7.3 g/dL (ref 6.0–8.3)

## 2017-09-18 LAB — POC URINALSYSI DIPSTICK (AUTOMATED)
BILIRUBIN UA: NEGATIVE
Glucose, UA: NEGATIVE
KETONES UA: NEGATIVE
LEUKOCYTES UA: NEGATIVE
Nitrite, UA: NEGATIVE
Spec Grav, UA: 1.015 (ref 1.010–1.025)
Urobilinogen, UA: 0.2 E.U./dL
pH, UA: 6 (ref 5.0–8.0)

## 2017-09-18 LAB — LIPID PANEL
CHOL/HDL RATIO: 4
Cholesterol: 109 mg/dL (ref 0–200)
HDL: 26.5 mg/dL — AB (ref >39.00)
LDL Cholesterol: 57 mg/dL (ref 0–99)
NonHDL: 82.36
Triglycerides: 126 mg/dL (ref 0.0–149.0)
VLDL: 25.2 mg/dL (ref 0.0–40.0)

## 2017-09-18 LAB — PSA: PSA: 2.4 ng/mL (ref 0.10–4.00)

## 2017-09-18 LAB — CBC
HEMATOCRIT: 45.9 % (ref 39.0–52.0)
Hemoglobin: 15.3 g/dL (ref 13.0–17.0)
MCHC: 33.3 g/dL (ref 30.0–36.0)
MCV: 87.3 fl (ref 78.0–100.0)
Platelets: 192 10*3/uL (ref 150.0–400.0)
RBC: 5.26 Mil/uL (ref 4.22–5.81)
RDW: 16.6 % — ABNORMAL HIGH (ref 11.5–15.5)
WBC: 7.5 10*3/uL (ref 4.0–10.5)

## 2017-09-18 LAB — HEMOGLOBIN A1C: HEMOGLOBIN A1C: 6 % (ref 4.6–6.5)

## 2017-09-18 NOTE — Patient Instructions (Addendum)
Trial zantac or ranitidine 158m twice a day instead of protonix- if does not work (having reflux or dry mouth too bad)- can switch back

## 2017-09-18 NOTE — Assessment & Plan Note (Signed)
Hyperglycemia- at risk for diabetes. Update a1c. Has lost some weight thankfully

## 2017-09-18 NOTE — Assessment & Plan Note (Signed)
  CAD_ on plavix, atorvastatin. LDL goal <70

## 2017-09-18 NOTE — Assessment & Plan Note (Signed)
CKD III- monitor with BMET. GERD controlled on protonix- would prefer zantac . "Trial zantac 143m twice a day instead of protonix- if does not work (having reflux or dry mouth too bad)- can switch back"

## 2017-09-18 NOTE — Assessment & Plan Note (Signed)
OSA- using his cpap . Having some dry mouth- will ask [facility] to see if any adjustments can be made

## 2017-09-18 NOTE — Assessment & Plan Note (Signed)
HLD- update lipids on atorvastatin 50m

## 2017-09-18 NOTE — Assessment & Plan Note (Signed)
Hypertension- controlled on irbesartan-hct 150-76m, atenolol 2106m

## 2017-09-18 NOTE — Addendum Note (Signed)
Addended by: Frutoso Chase A on: 09/18/2017 09:43 AM   Modules accepted: Orders

## 2017-09-18 NOTE — Progress Notes (Signed)
Phone: [phone]  Subjective:  Patient presents today for their annual physical. Chief complaint-noted.   See problem oriented charting- ROS- full  review of systems was completed and negative except for: dental problems and seasonal allergies  The following were reviewed and entered/updated in epic: Past Medical History:  Diagnosis Date  . Adenomatous polyps 03/2004, 11/2012  . Arthritis   . Coronary atherosclerosis of unspecified type of vessel, native or graft   . Esophagitis 1993  . GERD (gastroesophageal reflux disease)   . Hyperlipidemia   . Hypertension   . Myocardial infarction ([place]) 1993  . Sleep apnea    cpap  . Stroke ([place]) 02/2014  . Trigger finger (acquired)   . 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 ULCERATIVE COLITIS-LEFT SIDE 07/03/2008    Priority: High  . CAD (coronary artery disease) 06/15/2007    Priority: High  . Hyperglycemia 06/08/2015    Priority: Medium  . CKD (chronic kidney disease), stage III ([place]) 10/24/2014    Priority: Medium  . Sleep apnea 07/02/2008    Priority: Medium  . Hyperlipidemia 06/14/2007    Priority: Medium  . Essential hypertension 06/14/2007    Priority: Medium  . Balanitis 03/06/2017    Priority: Low  . OA (osteoarthritis) of knee 12/07/2015    Priority: Low  . Former smoker 06/08/2015    Priority: Low  . Edema 02/04/2015    Priority: Low  . Allergic rhinitis 10/24/2014    Priority: Low  . Obesity (BMI 30-39.9) 04/25/2014    Priority: Low  . Right cervical radiculopathy 04/12/2013    Priority: Low  . Low back pain 01/11/2008    Priority: Low  . GERD 11/07/2007    Priority: Low  . Trigger finger, acquired 10/23/2007    Priority: Low  . History of colonic polyps 06/14/2007    Priority: Low   Past Surgical History:  Procedure Laterality Date  . ANGIOPLASTY  1993  . CHOLECYSTECTOMY  2002  . LUMBAR FUSION  1983  . ROTATOR CUFF REPAIR   2009   right  . TRIGGER FINGER RELEASE     right    Family History  Problem Relation Age of Onset  . Stroke Mother   . COPD Father   . Heart disease Sister        valve replacement  . CAD Brother   . Heart attack Brother   . Colon cancer Neg Hx   . Esophageal cancer Neg Hx   . Liver disease Neg Hx   . Diabetes Neg Hx   . Hypertension Neg Hx     Medications- reviewed and updated Current Outpatient Medications  Medication Sig Dispense Refill  . acyclovir ointment (ZOVIRAX) 5 % Apply topically every 3 (three) hours. 15 g 2  . atenolol (TENORMIN) 25 MG tablet TAKE 1 TABLET EVERY DAY 90 tablet 3  . atorvastatin (LIPITOR) 10 MG tablet TAKE 1 TABLET EVERY DAY 90 tablet 3  . clopidogrel (PLAVIX) 75 MG tablet TAKE 1 TABLET EVERY DAY 90 tablet 3  . furosemide (LASIX) 20 MG tablet Take 1 tablet (20 mg total) by mouth daily as needed for edema. 30 tablet 6  . irbesartan-hydrochlorothiazide (AVALIDE) 150-12.5 MG tablet Take 1 tablet by mouth daily. 90 tablet 3  . mercaptopurine (PURINETHOL) 50 MG tablet TAKE 1 AND 1/2 TABLETS BY MOUTH DAILY. GIVE ON AN EMPTY STOMACH 1 HOUR BEFORE OR 2 HOURS AFTER A MEAL. 45 tablet 0  .  nitroGLYCERIN (NITROSTAT) 0.4 MG SL tablet Place 1 tablet (0.4 mg total) under the tongue every 5 (five) minutes as needed for chest pain. 20 tablet 5  . pantoprazole (PROTONIX) 40 MG tablet TAKE 1 TABLET TWICE DAILY 180 tablet 2  . potassium chloride (K-DUR) 10 MEQ tablet Take 1 tablet (10 mEq total) by mouth daily. 30 tablet 3   No current facility-administered medications for this visit.     Allergies-reviewed and updated Allergies  Allergen Reactions  . Penicillins     REACTION: rash    Social History   Socioeconomic History  . Marital status: Married    Spouse name: None  . Number of children: 3  . Years of education: None  . Highest education level: None  Social Needs  . Financial resource strain: None  . Food insecurity - worry: None  . Food insecurity  - inability: None  . Transportation needs - medical: None  . Transportation needs - non-medical: None  Occupational History  . Occupation: apartment maintenance    Employer: [employer]    Comment: retired  Tobacco Use  . Smoking status: Former Smoker    Packs/day: 1.00    Years: 3.00    Pack years: 3.00    Types: Cigarettes    Last attempt to quit: 11/14/1966    Years since quitting: 50.8  . Smokeless tobacco: Former Systems developer    Types: Chew  . Tobacco comment: quit 50 years ago  Substance and Sexual Activity  . Alcohol use: No    Alcohol/week: 0.0 oz  . Drug use: No  . Sexual activity: None  Other Topics Concern  . None  Social History Narrative   Married (wife patient elsewhere). 3 children- son is a patient here. 4 grandkids.       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 Oropharynx normal Neck: no thyromegaly CV: RRR no murmurs rubs or gallops Lungs: CTAB no crackles, wheeze, rhonchi Abdomen: soft/nontender/nondistended/normal bowel sounds. No rebound or guarding.  Ext: no edema Skin: warm, dry Neuro: grossly normal, moves all extremities, PERRLA Rectal: normal tone, diffusely enlarged prostate- slight asymmetry on left side, no masses or tenderness  Assessment/Plan:  71 y.o. male presenting for annual physical.  Health Maintenance counseling: 1. Anticipatory guidance: Patient counseled regarding regular dental exams -needs tooth remove-d he does not want to come off plavix though so  tough to find someone, eye exams - yearly, wearing seatbelts.  2. Risk factor reduction:  Advised patient of need for regular exercise and diet rich and fruits and vegetables to reduce risk of heart attack and stroke. Exercise- no regular exercise- encourage- states has lost weight remaining active doing tasks around house. Diet-cut down on sweets and brea- weight trending down .  Wt Readings from Last 3 Encounters:  09/18/17 214 lb (97.1 kg)  07/13/17 218 lb 12.8 oz (99.2 kg)  03/20/17 220 lb 8 oz (  100 kg)  3. Immunizations/screenings/ancillary studies- discussed shingrix at pharmacy Immunization History  Administered Date(s) Administered  . Influenza Split 08/19/2011, 08/17/2012  . Influenza Whole 10/23/2007, 08/14/2009, 08/13/2010  . Influenza, High Dose Seasonal PF 09/13/2016  . Influenza,inj,Quad PF,6+ Mos 08/16/2013  . Influenza-Unspecified 08/28/2014, 09/07/2015, 09/01/2017  . Pneumococcal Conjugate-13 06/08/2015  . Pneumococcal Polysaccharide-23 04/25/2014  . Td 11/14/2000, 09/21/2010  4. Prostate cancer screening-  last year did not appear that psa would be covered under screening- this year is going through in epic so will order. Does have BPH- still no nocturia. Get some dribbling. Slight asymmetry on left- will monitor- if significant PSA increase- likely more than 0.5 or 0.6 consider urology referral Lab Results  Component Value Date   PSA 1.17 03/11/2014   PSA 0.84 04/05/2013   PSA 0.79 01/13/2012   5. Colon cancer screening - 12/13/12 and  5 year follow up  6. Skin cancer screening- has seen [place] [facility] in the past- not in last year. advised regular sunscreen use. Denies worrisome, changing, or new skin lesions.  39. Former smoker- declines AAA screen until 74  Status of chronic or acute concerns   Ulcerative colitis- follows with Dr. Fuller Plan on mercaptopurine. Doing well  Intermittent blisters on foreskin- uses topical. We did discuss in past this could be genital  herpes but he already uses protection fortunately  GERD CKD III- monitor with BMET. GERD controlled on protonix- would prefer zantac . "Trial zantac 134m twice a day instead of protonix- if does not work (having reflux or dry mouth too bad)- can switch back"  Essential hypertension Hypertension- controlled on irbesartan-hct 150-248m atenolol 2574mCAD (coronary artery disease)  CAD_ on plavix, atorvastatin. LDL goal <70  Hyperlipidemia HLD- update lipids on atorvastatin 85m37myperglycemia Hyperglycemia- at risk for diabetes. Update a1c. Has lost some weight thankfully  Sleep apnea OSA- using his cpap . Having some dry mouth- will ask [facility] to see if any adjustments can be made  6 months  Orders Placed This Encounter  Procedures  . Comprehensive metabolic panel    [place] [facility]    Order Specific Question:   Has the patient fasted?    Answer:   No  . Lipid panel    [place]    Order Specific Question:   Has the patient fasted?    Answer:   No  . PSA  . CBC    [place]  . Hemoglobin A1c    [place]  . POCT Urinalysis Dipstick (Automated)    Standing Status:   Future    Standing Expiration Date:   10/18/2017   Return precautions advised.  StepGarret Reddish

## 2017-09-19 ENCOUNTER — Other Ambulatory Visit: Payer: Self-pay

## 2017-09-19 DIAGNOSIS — R319 Hematuria, unspecified: Secondary | ICD-10-CM

## 2017-09-19 DIAGNOSIS — R972 Elevated prostate specific antigen [PSA]: Secondary | ICD-10-CM

## 2017-09-20 ENCOUNTER — Other Ambulatory Visit: Payer: Self-pay | Admitting: Family Medicine

## 2017-09-20 ENCOUNTER — Other Ambulatory Visit (INDEPENDENT_AMBULATORY_CARE_PROVIDER_SITE_OTHER): Payer: Medicare HMO

## 2017-09-20 DIAGNOSIS — R319 Hematuria, unspecified: Secondary | ICD-10-CM | POA: Diagnosis not present

## 2017-09-20 DIAGNOSIS — R972 Elevated prostate specific antigen [PSA]: Secondary | ICD-10-CM

## 2017-09-20 DIAGNOSIS — R3129 Other microscopic hematuria: Secondary | ICD-10-CM

## 2017-09-20 LAB — URINALYSIS, MICROSCOPIC ONLY

## 2017-10-20 ENCOUNTER — Other Ambulatory Visit: Payer: Self-pay | Admitting: Gastroenterology

## 2017-10-26 ENCOUNTER — Encounter: Payer: Self-pay | Admitting: Family Medicine

## 2017-10-26 ENCOUNTER — Ambulatory Visit: Payer: Medicare HMO | Admitting: Family Medicine

## 2017-10-26 VITALS — BP 136/82 | HR 58 | Temp 97.6°F | Resp 16 | Ht 68.0 in | Wt 214.2 lb

## 2017-10-26 DIAGNOSIS — R05 Cough: Secondary | ICD-10-CM

## 2017-10-26 DIAGNOSIS — J40 Bronchitis, not specified as acute or chronic: Secondary | ICD-10-CM | POA: Diagnosis not present

## 2017-10-26 DIAGNOSIS — R059 Cough, unspecified: Secondary | ICD-10-CM

## 2017-10-26 MED ORDER — AZITHROMYCIN 250 MG PO TABS: ORAL_TABLET | ORAL | 0 refills | Status: DC

## 2017-10-26 MED ORDER — GUAIFENESIN-DM 100-10 MG/5ML PO SYRP: 5.0000 mL | ORAL_SOLUTION | ORAL | 0 refills | Status: DC | PRN

## 2017-10-26 NOTE — Patient Instructions (Signed)
Start the azithromycin and cough medication.  Let us know if worsening or not improving.  Take care,  Dr Jerline Pain

## 2017-10-26 NOTE — Progress Notes (Signed)
   Subjective:  [name] is a 71 y.o. male who presents today with a chief complaint of cough.   HPI:  Cough, Acute issue Started about 6 days ago. Worsened over that time.  Associated symptoms include irritated throat, rhinorrhea, nasal congestion.  No fevers.  Tried several over-the-counter medications without symptom relief.  No fevers or chills.  No sick contacts.  His wife recently had surgery and he is concerned about spreading his symptoms to her.  ROS: Per HPI  PMH: Smoking history reviewed.  Former smoker.  Objective:  Physical Exam: BP 136/82 (BP Location: Left Arm, Patient Position: Sitting, Cuff Size: Large)   Pulse (!) 58   Temp 97.6 F (36.4 C) (Oral)   Resp 16   Ht 5' 8"  (1.727 m)   Wt 214 lb 3.2 oz (97.2 kg)   SpO2 97%   BMI 32.57 kg/m   Gen: NAD, resting comfortably HEENT: TMs clear bilaterally.  Oropharynx erythematous without exudate.  Nasal mucosa boggy and erythematous.  Maxillary sinuses clear to transillumination bilaterally CV: RRR with no murmurs appreciated Pulm: Normal work of breathing.  Speaking in full sentences.  Diffuse rhonchi with very slight end expiratory wheeze at bases laterally. MSK: No edema, cyanosis, or clubbing noted Skin: Warm, dry Neuro: Grossly normal, moves all extremities Psych: Normal affect and thought content  Assessment/Plan:  1.  Cough 2.  Bronchitis Given that his symptoms have persisted for about a week along with his lung exam findings, it is reasonable to treat him with a course of antibiotics.  His respiratory status is stable.  Sent in a prescription for Z-Pak.  Also sent in for guaifenesin-dextromethorphan cough syrup.  Encouraged good oral hydration.  Return precautions reviewed.  Follow-up as needed.  Algis Greenhouse. Jerline Pain, MD 10/26/2017 4:03 PM

## 2017-10-29 DIAGNOSIS — B029 Zoster without complications: Secondary | ICD-10-CM | POA: Diagnosis not present

## 2017-11-10 ENCOUNTER — Telehealth: Payer: Self-pay | Admitting: Family Medicine

## 2017-11-10 NOTE — Telephone Encounter (Signed)
Copied from [place] [id number]. Topic: Inquiry >> Nov 10, 2017 10:56 AM Conception Chancy, NT wrote: Reason for CRM: patient states he has the shingles and he was seen at the Urgent Care 14 days ago. He states it is still bothering him and he would like a call back from Dr. Yong Channel nurse so that he can discuss information with her.

## 2017-11-13 ENCOUNTER — Ambulatory Visit (INDEPENDENT_AMBULATORY_CARE_PROVIDER_SITE_OTHER): Payer: Medicare HMO

## 2017-11-13 ENCOUNTER — Ambulatory Visit: Payer: Medicare HMO | Admitting: Family Medicine

## 2017-11-13 ENCOUNTER — Encounter: Payer: Self-pay | Admitting: Family Medicine

## 2017-11-13 VITALS — BP 138/80 | HR 58 | Temp 97.6°F | Ht 68.0 in | Wt 212.6 lb

## 2017-11-13 DIAGNOSIS — R0781 Pleurodynia: Secondary | ICD-10-CM

## 2017-11-13 DIAGNOSIS — B0229 Other postherpetic nervous system involvement: Secondary | ICD-10-CM | POA: Diagnosis not present

## 2017-11-13 DIAGNOSIS — R0789 Other chest pain: Secondary | ICD-10-CM | POA: Diagnosis not present

## 2017-11-13 DIAGNOSIS — C669 Malignant neoplasm of unspecified ureter: Secondary | ICD-10-CM | POA: Diagnosis not present

## 2017-11-13 MED ORDER — GABAPENTIN 300 MG PO CAPS: ORAL_CAPSULE | ORAL | 3 refills | Status: DC

## 2017-11-13 NOTE — Patient Instructions (Signed)
Take 1 pill today. Take one in the evening and one the afternoon tomorrow. After that, take 1 pills, three times daily.  Let me know if your symptoms worsen or do not improve.  Take care, Dr Jerline Pain

## 2017-11-13 NOTE — Progress Notes (Signed)
Radiologist did not see any further findings on his chest xray. No changes needed to his treatment plan. Please inform patient.  Algis Greenhouse. Jerline Pain, MD 11/13/2017 11:49 AM

## 2017-11-13 NOTE — Progress Notes (Signed)
    Subjective:  [name] is a 71 y.o. male who presents today with a chief complaint of left side pain.   HPI:  Left Flank Pain, acute issue Started 4-5 days ago. Stable over that time. Pain is a sharp, stabbing pain.  Of note, he was ecently diagnosed and treated for shingles at an urgent care.  At that time he had a rash across his left flank.  The rash is since scabbed over and is healing.  The area of pain is superior to the area of the rash.  No new rashes.  No cough.  No shortness of breath.  No fevers or chills.  He has tried taking Tylenol which helps some.  No other obvious precipitating events.  No other obvious alleviating or  ROS: Per HPI  PMH: Former smoker.   Objective:  Physical Exam: BP 138/80 (BP Location: Left Arm, Patient Position: Sitting, Cuff Size: Normal)   Pulse (!) 58   Temp 97.6 F (36.4 C) (Oral)   Ht 5' 8"  (1.727 m)   Wt 212 lb 9.6 oz (96.4 kg)   SpO2 97%   BMI 32.33 kg/m   Gen: NAD, resting comfortably CV: RRR with no murmurs appreciated Pulm: Normal work of breathing.  Crackles to left lower lung base.  Otherwise clear to auscultation bilaterally. GI: Normal bowel sounds.  Nontender nondistended. MSK: Chest wall nontender to palpation.  No allodynia. Skin: Diffuse, erythematous, well-healing rash stretching from back to anterior abdomen on the left side.  Consistent with healing shingles outbreak.  Assessment/Plan:  Post-Herpetic Neuralgia Most likely the source for patient's left flank pain.  He did have some crackles on his pulmonary exam, however his chest x-ray is relatively clear-we will await radiology read.  He does not have any urinary symptoms to suggest pyelonephritis or nephrolithiasis.  His abdominal exam is otherwise benign.  Discussed treatment options with patient.  We will start gabapentin.  Discussed possible side effects of this medication and other alternatives such as topical capsaicin cream.  Will titrate as tolerated to  900 mg total daily.  Return precautions reviewed.   Algis Greenhouse. Jerline Pain, MD 11/13/2017 10:25 AM

## 2017-11-14 DIAGNOSIS — B029 Zoster without complications: Secondary | ICD-10-CM

## 2017-11-14 DIAGNOSIS — N390 Urinary tract infection, site not specified: Secondary | ICD-10-CM

## 2017-11-14 HISTORY — DX: Urinary tract infection, site not specified: N39.0

## 2017-11-14 HISTORY — DX: Zoster without complications: B02.9

## 2017-11-15 NOTE — Telephone Encounter (Signed)
Called patient and left a voicemail message asking patient to return my call. Patient was seen in office on 11/13/17 by Dr. Jerline Pain

## 2017-11-16 DIAGNOSIS — R3121 Asymptomatic microscopic hematuria: Secondary | ICD-10-CM | POA: Diagnosis not present

## 2017-11-16 NOTE — Telephone Encounter (Signed)
Pt called back, I  was not able to get anyone on the phone at the office.

## 2017-11-16 NOTE — Telephone Encounter (Signed)
See note

## 2017-11-20 NOTE — Telephone Encounter (Signed)
Call patient to advise regarding cream for shingles.   Copied from [place]. Topic: General - Other >> Nov 20, 2017 10:55 AM Yvette Rack wrote: Reason for CRM:  patient calling stating that the provider stated that he could use a cream for his shingles he states that he is in pain and gabapentin isn't working and would like to try the cream

## 2017-11-21 ENCOUNTER — Other Ambulatory Visit: Payer: Self-pay | Admitting: Gastroenterology

## 2017-11-22 ENCOUNTER — Encounter: Payer: Self-pay | Admitting: *Deleted

## 2017-11-22 MED ORDER — LIDOCAINE-CAPSAICIN 4-0.1 % EX CREA: 1.0000 "application " | TOPICAL_CREAM | Freq: Three times a day (TID) | CUTANEOUS | 1 refills | Status: DC

## 2017-11-22 NOTE — Telephone Encounter (Signed)
I sent in a lidocaine capsaicin prescription. A lot of insurances dont cover topical lidocaine though. He may be better off just getting over the counter capsaicin.

## 2017-11-22 NOTE — Telephone Encounter (Signed)
This encounter was created in error - please disregard.

## 2017-11-22 NOTE — Telephone Encounter (Signed)
Attempted to call patient, left voicemail requesting call back.

## 2017-11-24 NOTE — Telephone Encounter (Signed)
Spoke to pt, asked him if he was able to get Rx for lidocaine that Dr. Yong Channel sent in? Pt said yes, he thought is was just OTC but did get it. Told him okay good. Asked how he is feeling? Pt said little better, the rash has improved but pain is still pretty bad. Asked if taking Gabapentin? Pt said yes three times a day. Told pt if pain not improved by next week to give [place] a call. Pt verbalized understanding.

## 2017-12-01 ENCOUNTER — Telehealth: Payer: Self-pay | Admitting: Family Medicine

## 2017-12-01 NOTE — Telephone Encounter (Signed)
Copied from [place] [id number]. Topic: Quick Communication - See Telephone Encounter >> 2017-12-14  3:34 PM Arletha Grippe wrote: CRM for notification. See Telephone encounter for:   December 14, 2017. Pt wife passed away today, and daughter is asking if pcp can call in low dose med for his nerves.  Pharm is [facility] [phone]. Cb# [phone], Adonis Brook ( daughter)

## 2017-12-01 NOTE — Telephone Encounter (Signed)
See note

## 2017-12-06 NOTE — Telephone Encounter (Signed)
Spoke with patient. He denies need for new medicine. States some trouble with sleep and we discussed using the third gabapentin of day right before bed as may help with sleep as well as post herpetic neuralgia- he will call back if worsening symptoms.

## 2017-12-07 ENCOUNTER — Other Ambulatory Visit: Payer: Self-pay | Admitting: Internal Medicine

## 2017-12-13 DIAGNOSIS — I700 Atherosclerosis of aorta: Secondary | ICD-10-CM | POA: Diagnosis not present

## 2017-12-13 DIAGNOSIS — R3121 Asymptomatic microscopic hematuria: Secondary | ICD-10-CM | POA: Diagnosis not present

## 2017-12-14 DIAGNOSIS — R3121 Asymptomatic microscopic hematuria: Secondary | ICD-10-CM | POA: Diagnosis not present

## 2017-12-14 DIAGNOSIS — R350 Frequency of micturition: Secondary | ICD-10-CM | POA: Diagnosis not present

## 2017-12-14 DIAGNOSIS — N401 Enlarged prostate with lower urinary tract symptoms: Secondary | ICD-10-CM | POA: Diagnosis not present

## 2017-12-18 ENCOUNTER — Other Ambulatory Visit: Payer: Medicare HMO

## 2017-12-18 ENCOUNTER — Telehealth: Payer: Self-pay | Admitting: Family Medicine

## 2017-12-18 DIAGNOSIS — R300 Dysuria: Secondary | ICD-10-CM | POA: Diagnosis not present

## 2017-12-18 DIAGNOSIS — R3915 Urgency of urination: Secondary | ICD-10-CM | POA: Diagnosis not present

## 2017-12-18 DIAGNOSIS — R3121 Asymptomatic microscopic hematuria: Secondary | ICD-10-CM | POA: Diagnosis not present

## 2017-12-19 ENCOUNTER — Encounter: Payer: Self-pay | Admitting: Gastroenterology

## 2017-12-21 ENCOUNTER — Other Ambulatory Visit: Payer: Self-pay | Admitting: Gastroenterology

## 2017-12-25 ENCOUNTER — Other Ambulatory Visit: Payer: Self-pay

## 2017-12-25 ENCOUNTER — Telehealth: Payer: Self-pay | Admitting: Family Medicine

## 2017-12-25 MED ORDER — ATENOLOL 25 MG PO TABS: 25.0000 mg | ORAL_TABLET | Freq: Every day | ORAL | 3 refills | Status: DC

## 2017-12-25 MED ORDER — ATENOLOL 25 MG PO TABS: 25.0000 mg | ORAL_TABLET | Freq: Every day | ORAL | 0 refills | Status: DC

## 2017-12-25 NOTE — Telephone Encounter (Signed)
[facility] and they had not received e-prescription 12/07/17. Called and gave verbal order to refill atenolol 25 mg 1 pill taken daily #90 tabs with 3 refills.  Pt needs limited supply until mail order arrives so 15 tabs ordered through [facility].

## 2017-12-25 NOTE — Telephone Encounter (Signed)
Error

## 2017-12-25 NOTE — Addendum Note (Signed)
Addended by: Corky Sox E on: 12/25/2017 12:04 PM   Modules accepted: Orders

## 2017-12-25 NOTE — Telephone Encounter (Signed)
Copied from [place]. Topic: Quick Communication - Rx Refill/Question >> Dec 25, 2017 10:22 AM Percell Belt A wrote: Medication: atenolol (TENORMIN) 25 MG tablet [[id number]]  pt stated that he never got this meds from 12/07/2017.  Can we resent this to [facility] and a small supply to local pharmacy?  [facility] in [place]    Has the patient contacted their pharmacy? no   (Agent: If no, request that the patient contact the pharmacy for the refill.)   Preferred Pharmacy (with phone number or street name): [facility] (local) and [facility] ( mail order)   Agent: Please be advised that RX refills may take up to 3 business days. We ask that you follow-up with your pharmacy.

## 2017-12-30 ENCOUNTER — Other Ambulatory Visit: Payer: Self-pay | Admitting: Internal Medicine

## 2018-01-02 NOTE — Addendum Note (Signed)
Addended by: Derl Barrow on: 01/02/2018 11:11 AM   Modules accepted: Orders

## 2018-01-09 ENCOUNTER — Other Ambulatory Visit: Payer: Self-pay | Admitting: Internal Medicine

## 2018-01-09 MED ORDER — POTASSIUM CHLORIDE CRYS ER 10 MEQ PO TBCR: 10.0000 meq | EXTENDED_RELEASE_TABLET | Freq: Every day | ORAL | 0 refills | Status: DC

## 2018-01-09 NOTE — Telephone Encounter (Signed)
Pt's medication was sent to pt's pharmacy as requested. Confirmation received.  °

## 2018-01-09 NOTE — Telephone Encounter (Signed)
°*  STAT* If patient is at the pharmacy, call can be transferred to refill team.  1. Which medications need to be refilled? (please list name of each medication and dose if known)potassium chloride (K-DUR,KLOR-CON) 10 MEQ  2. Which pharmacy/location (including street and city if local pharmacy) is medication to be sent to? [place], [place]  3. Do they need a 30 day or 90 day supply? [place]

## 2018-01-17 ENCOUNTER — Other Ambulatory Visit: Payer: Self-pay | Admitting: Gastroenterology

## 2018-01-18 ENCOUNTER — Encounter: Payer: Self-pay | Admitting: Internal Medicine

## 2018-01-18 ENCOUNTER — Ambulatory Visit: Payer: Medicare HMO | Admitting: Internal Medicine

## 2018-01-18 VITALS — BP 126/74 | HR 49 | Ht 68.0 in | Wt 207.2 lb

## 2018-01-18 DIAGNOSIS — I2510 Atherosclerotic heart disease of native coronary artery without angina pectoris: Secondary | ICD-10-CM

## 2018-01-18 DIAGNOSIS — I10 Essential (primary) hypertension: Secondary | ICD-10-CM

## 2018-01-18 DIAGNOSIS — E782 Mixed hyperlipidemia: Secondary | ICD-10-CM | POA: Diagnosis not present

## 2018-01-18 MED ORDER — ATENOLOL 25 MG PO TABS: 12.5000 mg | ORAL_TABLET | Freq: Every day | ORAL | 3 refills | Status: DC

## 2018-01-18 NOTE — Patient Instructions (Signed)
Your physician has recommended you make the following change in your medication:  1.) decrease atenolol to 12.5 mg once a day  Your physician recommends that you return for lab work in: today Artist)  Your physician wants you to follow-up in: January, 2020 with Dr. Harrington Challenger.  You will receive a reminder letter in the mail two months in advance. If you don't receive a letter, please call our office to schedule the follow-up appointment.

## 2018-01-18 NOTE — Progress Notes (Signed)
Cardiology Office Note   Date:  01/18/2018   ID:  [name], [name] [birth year], MRN [id number]  PCP:  Marin Olp, MD  Cardiologist:   Dorris Carnes, MD    F/U of CAD     History of Present Illness: [name] is a 72 y.o. male with a history of CAD (MI in 1993)  Underwent PTCA to LAD  Nuclear scan in 2011 normal  Echo 2016 LVEF normal   I saw him in Feb 2018  Since seen he has been under increased stress  Wife died in 21-Dec-2016 from renal cancer  They had been married 75 years   Around that time he was diagnosed with shingles  Treated for this   Still with pain on L torso  He denies CP  Breathing is OK  He denies PND  Notes occasional edema  No dizziness    Current Meds  Medication Sig  . acyclovir ointment (ZOVIRAX) 5 % Apply topically every 3 (three) hours.  Marland Kitchen atenolol (TENORMIN) 25 MG tablet Take 1 tablet (25 mg total) by mouth daily.  Marland Kitchen atorvastatin (LIPITOR) 10 MG tablet TAKE 1 TABLET EVERY DAY  . clopidogrel (PLAVIX) 75 MG tablet TAKE 1 TABLET EVERY DAY  . furosemide (LASIX) 20 MG tablet Take 1 tablet (20 mg total) by mouth daily as needed for edema.  . gabapentin (NEURONTIN) 300 MG capsule Take qd on day 1. Bid on day 2. Tid on day 3 and beyond.  . irbesartan-hydrochlorothiazide (AVALIDE) 150-12.5 MG tablet Take 1 tablet by mouth daily.  . Lidocaine-Capsaicin 4-0.1 % CREA Apply 1 application topically 3 (three) times daily.  . mercaptopurine (PURINETHOL) 50 MG tablet TAKE 1 AND 1/2 TABLETS BY MOUTH DAILY. GIVE ON AN EMPTY STOMACH 1 HOUR BEFORE OR 2 HOURS AFTER A MEAL.  . nitroGLYCERIN (NITROSTAT) 0.4 MG SL tablet Place 1 tablet (0.4 mg total) under the tongue every 5 (five) minutes as needed for chest pain.  . potassium chloride (K-DUR,KLOR-CON) 10 MEQ tablet Take 1 tablet (10 mEq total) by mouth daily. Please keep upcoming appt for future refills. Thank you  . ranitidine (ZANTAC) 150 MG capsule Take 150 mg by mouth 2 (two) times daily.     Allergies:    Penicillins   Past Medical History:  Diagnosis Date  . Adenomatous polyps 03/2004, December 21, 2012  . Arthritis   . Coronary atherosclerosis of unspecified type of vessel, native or graft   . Esophagitis 1993  . GERD (gastroesophageal reflux disease)   . Hyperlipidemia   . Hypertension   . Myocardial infarction ([place]) 1993  . Sleep apnea    cpap  . Stroke ([place]) 02/2014  . Trigger finger (acquired)   . 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 ANGIOPLASTY  1993  . CHOLECYSTECTOMY  2002  . LUMBAR FUSION  1983  . ROTATOR CUFF REPAIR  2009   right  . TRIGGER FINGER RELEASE     right     Social History:  The patient  reports that he quit smoking about 51 years ago. His smoking use included cigarettes. He has a 3.00 pack-year smoking history. He has quit using smokeless tobacco. His smokeless tobacco use included chew. He reports that he does not drink alcohol or use drugs.   Family History:  The patient's family history includes CAD in his brother; COPD in his father; Heart attack in his brother; Heart disease in his sister;  Stroke in his mother.    ROS:  Please see the history of present illness. All other systems are reviewed and  Negative to the above problem except as noted.    PHYSICAL EXAM: VS:  BP 126/74   Pulse (!) 49   Ht 5' 8"  (1.727 m)   Wt 207 lb 3.2 oz (94 kg)   BMI 31.50 kg/m   GEN: Well nourished, well developed, in no acute distress  HEENT: normal  Neck: no JVD, carotid bruits, or masses Cardiac: RRR; no murmurs, rubs, or gallops,no edema  Respiratory:  clear to auscultation bilaterally, normal work of breathing GI: soft, nontender, nondistended, + BS  No hepatomegaly  MS: no deformity Moving all extremities   Skin: warm and dry, no rash Neuro:  Strength and sensation are intact Psych: euthymic mood, full affect   EKG:  EKG is ordered today. SB 49 bpm     Lipid Panel    Component Value  Date/Time   CHOL 109 09/18/2017 0924   TRIG 126.0 09/18/2017 0924   TRIG 88 10/20/2006 0927   HDL 26.50 (L) 09/18/2017 0924   CHOLHDL 4 09/18/2017 0924   VLDL 25.2 09/18/2017 0924   LDLCALC 57 09/18/2017 0924      Wt Readings from Last 3 Encounters:  01/18/18 207 lb 3.2 oz (94 kg)  11/13/17 212 lb 9.6 oz (96.4 kg)  10/26/17 214 lb 3.2 oz (97.2 kg)      ASSESSMENT AND PLAN:  1  CAD   No sympoms of angina Would recomm he back down on atenolol to 1/2 tab with badycarida    2  HL  LDL in Nov 2018 was 57  HDL 27   3  HTN  BP is fair  Follow  No changes for now    4  HCM  Encouraged him to walk 25 min per day or 10 min more vigours  5  K  Check  Has been a little low    F/U in 1 year    Current medicines are reviewed at length with the patient today.  The patient does not have concerns regarding medicines.  Signed, Dorris Carnes, MD  01/18/2018 12:12 PM    [address], [place], [place]  [address] Phone: [phone]; Fax: [phone]

## 2018-01-19 ENCOUNTER — Telehealth: Payer: Self-pay | Admitting: Gastroenterology

## 2018-01-19 LAB — BASIC METABOLIC PANEL
BUN/Creatinine Ratio: 10 (ref 10–24)
BUN: 12 mg/dL (ref 8–27)
CO2: 28 mmol/L (ref 20–29)
CREATININE: 1.21 mg/dL (ref 0.76–1.27)
Calcium: 9.7 mg/dL (ref 8.6–10.2)
Chloride: 101 mmol/L (ref 96–106)
GFR, EST AFRICAN AMERICAN: 70 mL/min/{1.73_m2} (ref >59)
GFR, EST NON AFRICAN AMERICAN: 60 mL/min/{1.73_m2} (ref >59)
Glucose: 77 mg/dL (ref 65–99)
POTASSIUM: 4.1 mmol/L (ref 3.5–5.2)
SODIUM: 142 mmol/L (ref 134–144)

## 2018-01-19 MED ORDER — MERCAPTOPURINE 50 MG PO TABS: ORAL_TABLET | ORAL | 0 refills | Status: DC

## 2018-01-19 NOTE — Telephone Encounter (Signed)
Prescription sent to patient's pharmacy and patient informed to keep appt for any further refills.

## 2018-02-06 ENCOUNTER — Encounter: Payer: Self-pay | Admitting: Gastroenterology

## 2018-02-06 ENCOUNTER — Telehealth: Payer: Self-pay

## 2018-02-06 ENCOUNTER — Other Ambulatory Visit (INDEPENDENT_AMBULATORY_CARE_PROVIDER_SITE_OTHER): Payer: Medicare HMO

## 2018-02-06 ENCOUNTER — Ambulatory Visit: Payer: Medicare HMO | Admitting: Gastroenterology

## 2018-02-06 VITALS — BP 132/76 | HR 58 | Ht 68.0 in | Wt 208.1 lb

## 2018-02-06 DIAGNOSIS — K51319 Ulcerative (chronic) rectosigmoiditis with unspecified complications: Secondary | ICD-10-CM | POA: Diagnosis not present

## 2018-02-06 DIAGNOSIS — Z86010 Personal history of colonic polyps: Secondary | ICD-10-CM

## 2018-02-06 DIAGNOSIS — Z5181 Encounter for therapeutic drug level monitoring: Secondary | ICD-10-CM

## 2018-02-06 LAB — COMPREHENSIVE METABOLIC PANEL
ALT: 28 U/L (ref 0–53)
AST: 22 U/L (ref 0–37)
Albumin: 3.7 g/dL (ref 3.5–5.2)
Alkaline Phosphatase: 62 U/L (ref 39–117)
BUN: 11 mg/dL (ref 6–23)
CO2: 32 mEq/L (ref 19–32)
Calcium: 9.3 mg/dL (ref 8.4–10.5)
Chloride: 101 mEq/L (ref 96–112)
Creatinine, Ser: 1.09 mg/dL (ref 0.40–1.50)
GFR: 70.93 mL/min (ref >60.00)
Glucose, Bld: 110 mg/dL — ABNORMAL HIGH (ref 70–99)
POTASSIUM: 3.6 meq/L (ref 3.5–5.1)
SODIUM: 140 meq/L (ref 135–145)
Total Bilirubin: 0.7 mg/dL (ref 0.2–1.2)
Total Protein: 7.2 g/dL (ref 6.0–8.3)

## 2018-02-06 LAB — CBC WITH DIFFERENTIAL/PLATELET
BASOS PCT: 1.2 % (ref 0.0–3.0)
Basophils Absolute: 0.1 10*3/uL (ref 0.0–0.1)
Eosinophils Absolute: 0.2 10*3/uL (ref 0.0–0.7)
Eosinophils Relative: 2.3 % (ref 0.0–5.0)
HEMATOCRIT: 44.2 % (ref 39.0–52.0)
HEMOGLOBIN: 15.2 g/dL (ref 13.0–17.0)
LYMPHS PCT: 27.1 % (ref 12.0–46.0)
Lymphs Abs: 2.3 10*3/uL (ref 0.7–4.0)
MCHC: 34.4 g/dL (ref 30.0–36.0)
MCV: 87.9 fl (ref 78.0–100.0)
MONOS PCT: 10.9 % (ref 3.0–12.0)
Monocytes Absolute: 0.9 10*3/uL (ref 0.1–1.0)
NEUTROS ABS: 4.9 10*3/uL (ref 1.4–7.7)
Neutrophils Relative %: 58.5 % (ref 43.0–77.0)
PLATELETS: 263 10*3/uL (ref 150.0–400.0)
RBC: 5.03 Mil/uL (ref 4.22–5.81)
RDW: 17.1 % — AB (ref 11.5–15.5)
WBC: 8.3 10*3/uL (ref 4.0–10.5)

## 2018-02-06 LAB — LIPASE: LIPASE: 26 U/L (ref 11.0–59.0)

## 2018-02-06 MED ORDER — MERCAPTOPURINE 50 MG PO TABS: ORAL_TABLET | ORAL | 5 refills | Status: DC

## 2018-02-06 MED ORDER — SUPREP BOWEL PREP KIT 17.5-3.13-1.6 GM/177ML PO SOLN: 1.0000 | ORAL | 0 refills | Status: DC

## 2018-02-06 NOTE — Telephone Encounter (Signed)
Yes thanks ok to hold plavix as requested

## 2018-02-06 NOTE — Patient Instructions (Signed)
Normal BMI (Body Mass Index- based on height and weight) is between 23 and 30. Your BMI today is Body mass index is 31.65 kg/m. Marland Kitchen Please consider follow up  regarding your BMI with your Primary Care Provider.  We have sent   medications to your pharmacy for you to pick up at your convenience:  You have been scheduled for a colonoscopy. Please follow written instructions given to you at your visit today.  Please pick up your prep supplies at the pharmacy within the next 1-3 days. If you use inhalers (even only as needed), please bring them with you on the day of your procedure. Your physician has requested that you go to www.startemmi.com and enter the access code given to you at your visit today. This web site gives a general overview about your procedure. However, you should still follow specific instructions given to you by our office regarding your preparation for the procedure.  Your physician has requested that you go to the basement for lab work before leaving today:

## 2018-02-06 NOTE — Progress Notes (Signed)
    History of Present Illness: This is a 72 year old male with left sided ulcerative colitis and a personal history of precancerous colon polyps.  He relates that his wife passed away in 26-Dec-2022.  Per his report she was diagnosed with stage IV renal cancer in November.  He relates that he unintentional 20 pound weight loss that he feels is related to less food intake during his wife's illness and his subsequent grieving.  He has no gastrointestinal complaints.  Current Medications, Allergies, Past Medical History, Past Surgical History, Family History and Social History were reviewed in [employer] record.  Physical Exam: General: Well developed, well nourished, no acute distress Head: Normocephalic and atraumatic Eyes:  sclerae anicteric, EOMI Ears: Normal auditory acuity Mouth: No deformity or lesions Lungs: Clear throughout to auscultation Heart: Regular rate and rhythm; no murmurs, rubs or bruits Abdomen: Soft, non tender and non distended. No masses, hepatosplenomegaly or hernias noted. Normal Bowel sounds Rectal: Musculoskeletal: Symmetrical with no gross deformities  Pulses:  Normal pulses noted Extremities: No clubbing, cyanosis, edema or deformities noted Neurological: Alert oriented x 4, grossly nonfocal Psychological:  Alert and cooperative. Normal mood and affect  Assessment and Recommendations:  1.  Left sided ulcerative colitis and a personal history of precancerous colon polyps.  Weight loss is likely related to stress and grieving.  Ulcerative colitis appears to be well controlled on his current regimen.  Refill 6-mercaptopurine 75 mg daily.  CBC CMP lipase today.  He is due for colonoscopy.  Schedule colonoscopy. The risks (including bleeding, perforation, infection, missed lesions, medication reactions and possible hospitalization or surgery if complications occur), benefits, and alternatives to colonoscopy with possible biopsy and possible  polypectomy were discussed with the patient and they consent to proceed.    2. Hold Plavix 5 days before procedure - will instruct when and how to resume after procedure. Low but real risk of cardiovascular event such as heart attack, stroke, embolism, thrombosis or ischemia/infarct of other organs off Plavix explained and need to seek urgent help if this occurs. The patient consents to proceed. Will communicate by phone or EMR with patient's prescribing provider to confirm that holding Plavix is reasonable in this case.

## 2018-02-06 NOTE — Telephone Encounter (Signed)
[facility] Pre-operative Risk Assessment     Request for surgical clearance:     Endoscopy Procedure  What type of surgery is being performed?     colonoscopy  When is this surgery scheduled?     03/07/18  What type of clearance is required ?   Pharmacy  Are there any medications that need to be held prior to surgery and how long? Plavix 5 days  Practice name and name of physician performing surgery?      [facility]  What is your office phone and fax number?      Phone- [phone]  [phone]  Anesthesia type (None, local, MAC, general) ?       MAC

## 2018-02-07 NOTE — Telephone Encounter (Signed)
Spoke to patient this morning to inform him that Dr Yong Channel gave him clearance to hold Plavix for 5 days prior to 03/07/18 colonoscopy. Patient voiced understanding.

## 2018-02-08 ENCOUNTER — Telehealth: Payer: Self-pay | Admitting: Family Medicine

## 2018-02-08 ENCOUNTER — Other Ambulatory Visit: Payer: Self-pay

## 2018-02-08 DIAGNOSIS — I2510 Atherosclerotic heart disease of native coronary artery without angina pectoris: Secondary | ICD-10-CM

## 2018-02-08 DIAGNOSIS — I2583 Coronary atherosclerosis due to lipid rich plaque: Principal | ICD-10-CM

## 2018-02-08 MED ORDER — NITROGLYCERIN 0.4 MG SL SUBL: 0.4000 mg | SUBLINGUAL_TABLET | SUBLINGUAL | 5 refills | Status: DC | PRN

## 2018-02-08 NOTE — Telephone Encounter (Signed)
See note

## 2018-02-08 NOTE — Telephone Encounter (Signed)
Copied from [place]. Topic: Quick Communication - Rx Refill/Question >> Feb 08, 2018  3:18 PM Dorion, Petillo B wrote: Medication: nitroGLYCERIN (NITROSTAT) 0.4 MG SL tablet [[id number]]  Has the patient contacted their pharmacy? Yes.   (Agent: If no, request that the patient contact the pharmacy for the refill.) Preferred Pharmacy (with phone number or street name): [facility] Agent: Please be advised that RX refills may take up to 3 business days. We ask that you follow-up with your pharmacy.

## 2018-02-08 NOTE — Telephone Encounter (Signed)
Nitroglycerin 0.4 mg SL tablet   Last OV: 09/18/17 PCP: [place]: [place], [place] [phone] (Phone) [phone] (Fax)

## 2018-02-08 NOTE — Telephone Encounter (Signed)
Was asking for new diagnosis code as medicine had been linked. I picked new code then unlinked it. I sent in medicine

## 2018-02-21 ENCOUNTER — Encounter: Payer: Self-pay | Admitting: Gastroenterology

## 2018-02-22 ENCOUNTER — Other Ambulatory Visit: Payer: Medicare HMO

## 2018-02-28 NOTE — Progress Notes (Signed)
Subjective:   [name] is a 72 y.o. male who presents for Medicare Annual/Subsequent preventive examination.  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 Good  Doesn't have to go to the doctor very much   Regular exercise  On hold due to wife death in 2023-01-06  Diet Chol 109; HDL 26; ldl57 and trig 126 Breakfast; cereal or bar Lunch sandwich and cooks dinner most nights Eats at restaurants  approx 3 times a week  Wife does back sweets and agreed to cut back on eating them   fbs 110 and A1c 6.0   Cardiac Risk Factors include: male gender;advanced age (>35mn, >>78women);dyslipidemia;family history of premature cardiovascular disease;hypertension;obesity (BMI >30kg/m2);sedentary lifestyle   Health Maintenance Due  Topic Date Due  . COLONOSCOPY  12/13/2017   Colonoscopy 102/23/14 q 5 years and due [id number] and planned for next week  Shingrix update; discussed last year and was going to ask his dtr who works in the pharmacy       Objective:    Vitals: BP 138/78   Pulse (!) 55   Ht 5' 8"  (1.727 m)   Wt 206 lb (93.4 kg)   SpO2 99%   BMI 31.32 kg/m   Body mass index is 31.32 kg/m.  Advanced Directives 03/01/2018 10/18/2016 01/15/2015  Does Patient Have a Medical Advance Directive? No No No  Would patient like information on creating a medical advance directive? - - No - patient declined information    Tobacco Social History   Tobacco Use  Smoking Status Former Smoker  . Packs/day: 1.00  . Years: 3.00  . Pack years: 3.00  . Types: Cigarettes  . Last attempt to quit: 11/14/1966  . Years since quitting: 51.3  Smokeless Tobacco Former USystems developer . Types: Chew  Tobacco Comment   quit 50 years ago/AAA completed     Counseling given: Yes Comment: quit 50 years ago/AAA  completed   Clinical Intake:    Past Medical History:  Diagnosis Date  . Adenomatous polyps 03/2004, 12014-02-23 . Arthritis   . Bradycardia 2018  . Coronary atherosclerosis of unspecified type of vessel, native or graft   . Esophagitis 1993  . GERD (gastroesophageal reflux disease)   . Hyperlipidemia   . Hypertension   . [place]  . Shingles 2019  . Sleep apnea    cpap  . [place]  . Trigger finger (acquired)   . Ulcerative (chronic) proctosigmoiditis ([place] 1February 23, 2008 . UTI (urinary tract infection) 2019   Past Surgical History:  Procedure Laterality Date  . ANGIOPLASTY  1993  . CHOLECYSTECTOMY  2002  . LUMBAR FUSION  1983  . ROTATOR CUFF REPAIR  2009   right  . TRIGGER FINGER RELEASE     right   Family History  Problem Relation Age of Onset  . Stroke Mother   . COPD Father   . Heart disease Sister        valve replacement  . CAD Brother   . Heart attack Brother   . Colon cancer Neg Hx   . Esophageal cancer Neg Hx   . Liver disease Neg Hx   . Diabetes Neg Hx   . Hypertension Neg Hx    Social History   Socioeconomic History  . Marital status:  Married    Spouse name: Not on file  . Number of children: 3  . Years of education: Not on file  . Highest education level: Not on file  Occupational History  . Occupation: apartment maintenance    Employer: [employer]    Comment: retired  Scientific laboratory technician  . Financial resource strain: Not on file  . Food insecurity:    Worry: Not on file    Inability: Not on file  . Transportation needs:    Medical: Not on file    Non-medical: Not on file  Tobacco Use  . Smoking status: Former Smoker    Packs/day: 1.00    Years: 3.00    Pack years: 3.00    Types: Cigarettes    Last attempt to quit: 11/14/1966    Years since quitting: 51.3  . Smokeless tobacco: Former Systems developer    Types: Chew  . Tobacco comment: quit 50 years ago/AAA completed  Substance and Sexual Activity  . Alcohol  use: No    Alcohol/week: 0.0 oz  . Drug use: No  . Sexual activity: Not on file  Lifestyle  . Physical activity:    Days per week: Not on file    Minutes per session: Not on file  . Stress: Not on file  Relationships  . Social connections:    Talks on phone: Not on file    Gets together: Not on file    Attends religious service: Not on file    Active member of club or organization: Not on file    Attends meetings of clubs or organizations: Not on file    Relationship status: Not on file  Other Topics Concern  . Not on file  Social History Narrative   Married (wife patient elsewhere). 3 children- son is a patient here. 4 grandkids.       Retired from Dealer      Hobbies: work on an old truck- expensive to work on so a little at a time, mow the kids yards, enjoys yardwork      Pt does not get regular exercise--daily caffeine use-5 cups daily    Outpatient Encounter Medications as of 03/01/2018  Medication Sig  . atenolol (TENORMIN) 25 MG tablet Take 0.5 tablets (12.5 mg total) by mouth daily.  Marland Kitchen atorvastatin (LIPITOR) 10 MG tablet TAKE 1 TABLET EVERY DAY  . clopidogrel (PLAVIX) 75 MG tablet TAKE 1 TABLET EVERY DAY  . furosemide (LASIX) 20 MG tablet Take 1 tablet (20 mg total) by mouth daily as needed for edema.  . gabapentin (NEURONTIN) 300 MG capsule Take qd on day 1. Bid on day 2. Tid on day 3 and beyond.  . irbesartan-hydrochlorothiazide (AVALIDE) 150-12.5 MG tablet Take 1 tablet by mouth daily.  . Lidocaine-Capsaicin 4-0.1 % CREA Apply 1 application topically 3 (three) times daily.  . mercaptopurine (PURINETHOL) 50 MG tablet TAKE 1 AND 1/2 TABLETS BY MOUTH DAILY. GIVE ON AN EMPTY STOMACH 1 HOUR BEFORE OR 2 HOURS AFTER A MEAL.  . nitroGLYCERIN (NITROSTAT) 0.4 MG SL tablet Place 1 tablet (0.4 mg total) under the tongue every 5 (five) minutes as needed for chest pain.  . potassium chloride (K-DUR,KLOR-CON) 10 MEQ tablet Take 1 tablet (10 mEq total) by  mouth daily. Please keep upcoming appt for future refills. Thank you  . ranitidine (ZANTAC) 150 MG capsule Take 150 mg by mouth 2 (two) times daily.  Manus Gunning BOWEL PREP KIT 17.5-3.13-1.6 GM/177ML SOLN Take 1 kit by mouth  as directed.  . tamsulosin (FLOMAX) 0.4 MG CAPS capsule Take 0.4 mg by mouth daily.    No facility-administered encounter medications on file as of 03/01/2018.     Activities of Daily Living In your present state of health, do you have any difficulty performing the following activities: 03/01/2018  Hearing? N  Vision? N  Difficulty concentrating or making decisions? N  Comment Thinking clearly ;states he has to "think" more now   Walking or climbing stairs? Y  Dressing or bathing? N  Doing errands, shopping? N  Preparing Food and eating ? N  Using the Toilet? N  In the past six months, have you accidently leaked urine? Y  Do you have problems with loss of bowel control? (No Data)  Comment having colonoscopy with hx of colitis   Managing your Medications? N  Managing your Finances? N  Housekeeping or managing your Housekeeping? N  Some recent data might be hidden    Patient Care Team: Marin Olp, MD as PCP - General (Family Medicine)   Assessment:   This is a routine wellness examination for Brek.  Exercise Activities and Dietary recommendations Current Exercise Habits: Home exercise routine, Intensity: Mild  Goals    . Weight (lb) < 200 lb (90.7 kg)     Try to eat less sugar; cookies and cakes      . Weight (lb) < 200 lb (90.7 kg)     Losing weight will help with your PRE Diabetes and help you to feel better        Fall Risk Fall Risk  03/01/2018 10/26/2017 10/18/2016 09/13/2016 06/08/2015  Falls in the past year? No No No No No     Depression Screen PHQ 2/9 Scores 03/01/2018 10/26/2017 10/18/2016 09/13/2016  PHQ - 2 Score 0 0 0 0   States no, but is grieving   Cognitive Function MMSE - Mini Mental State Exam 03/01/2018 10/18/2016  Not  completed: (No Data) -  Orientation to time - 5  Orientation to Place - 5  Registration - 3  Attention/ Calculation - 5  Recall - 3  Language- name 2 objects - 2  Language- repeat - 1  Language- follow 3 step command - 3  Language- read & follow direction - 1  Write a sentence - 1  Copy design - 1  Total score - 30    not completed due to grief, but states his memory is "better" because it has to be.He now has to keep up with everything.  minister lives next door to him Goes to church Wed and Sunday's Dtr is helping him with [employer] his own meds     Immunization History  Administered Date(s) Administered  . Influenza Split 08/19/2011, 08/17/2012  . Influenza Whole 10/23/2007, 08/14/2009, 08/13/2010  . Influenza, High Dose Seasonal PF 09/13/2016  . Influenza,inj,Quad PF,6+ Mos 08/16/2013  . Influenza-Unspecified 08/28/2014, 09/07/2015, 09/01/2017  . Pneumococcal Conjugate-13 06/08/2015  . Pneumococcal Polysaccharide-23 04/25/2014  . Td 11/14/2000, 09/21/2010      Screening Tests Health Maintenance  Topic Date Due  . COLONOSCOPY  12/13/2017  . INFLUENZA VACCINE  06/14/2018  . TETANUS/TDAP  09/21/2020  . Hepatitis C Screening  Completed  . PNA vac Low Risk Adult  Completed         Plan:      PCP Notes   Health Maintenance Colonoscopy next week  Mentioned shingrix and will discuss with Dr. Yong Channel as he is still having herpatic pain x 4  months; Will check on   Abnormal Screens  bP was elevated but came down at 2nd reading   Referrals  None; Dtr has offered grief counseling at church if he needs it Good support   Patient concerns; Dr. Harrington Challenger decreased atenolol from 25 to 12.5 due to low pulse, pulse 55 today  Has been to see a UR and then got a UTI after spouse passed   Adjusting to maintaining bills, cooking for himself  Referred to [facility] representative for help with bills   Nurse Concerns; Wife passed Jan 18th Children are checking on  him daily   Next PCP apt May 6th      I have personally reviewed and noted the following in the patient's chart:   . Medical and social history . Use of alcohol, tobacco or illicit drugs  . Current medications and supplements . Functional ability and status . Nutritional status . Physical activity . Advanced directives . List of other physicians . Hospitalizations, surgeries, and ER visits in previous 12 months . Vitals . Screenings to include cognitive, depression, and falls . Referrals and appointments  In addition, I have reviewed and discussed with patient certain preventive protocols, quality metrics, and best practice recommendations. A written personalized care plan for preventive services as well as general preventive health recommendations were provided to patient.     Wynetta Fines, RN  03/01/2018

## 2018-03-01 ENCOUNTER — Encounter: Payer: Self-pay | Admitting: *Deleted

## 2018-03-01 ENCOUNTER — Other Ambulatory Visit (INDEPENDENT_AMBULATORY_CARE_PROVIDER_SITE_OTHER): Payer: Medicare HMO

## 2018-03-01 ENCOUNTER — Ambulatory Visit: Payer: Medicare HMO | Admitting: *Deleted

## 2018-03-01 VITALS — BP 138/78 | HR 55 | Ht 68.0 in | Wt 206.0 lb

## 2018-03-01 DIAGNOSIS — R972 Elevated prostate specific antigen [PSA]: Secondary | ICD-10-CM

## 2018-03-01 DIAGNOSIS — Z0000 Encounter for general adult medical examination without abnormal findings: Secondary | ICD-10-CM | POA: Diagnosis not present

## 2018-03-01 LAB — PSA: PSA: 1.52 ng/mL (ref 0.10–4.00)

## 2018-03-01 NOTE — Patient Instructions (Addendum)
[name] , Thank you for taking time to come for your Medicare Wellness Visit. I appreciate your ongoing commitment to your health goals. Please review the following plan we discussed and let me know if I can assist you in the future.  [facility]  Address: [address], [place], [place] [address]  Hours:  Open ? Closes 5PM Phone: [phone] Ask to speak to the [facility] Web designer health insurance program rep.    Call Dr. Gilford Rile office to see if they have sent notes to Dr. Yong Channel   May be a good time to complete your [place] sometimes this year   Try to watch your sugar due to Pre-diabetes [facility]  The new shingles shot is not a live virus. You can discuss with Dr. Yong Channel at your 5/6 apt when to take this as you are still have shingles pain  Please ask your pharmacist if the shingrix is available.   Shingrix is a vaccine for the prevention of Shingles in Adults 50 and older.  If you are on Medicare, the shingrix is covered under your Part D plan, so you will take both of the vaccines in the series at your pharmacy. Please check with your benefits regarding applicable copays or out of pocket expenses.  The Shingrix is given in 2 vaccines approx 8 weeks apart. You must receive the 2nd dose prior to 6 months from receipt of the first. Please have the pharmacist print out you Immunization  dates for our office records      These are the goals we discussed: Goals    . Weight (lb) < 200 lb (90.7 kg)     Try to eat less sugar; cookies and cakes      . Weight (lb) < 200 lb (90.7 kg)     Losing weight will help with your PRE Diabetes and help you to feel better        This is a list of the screening recommended for you and due dates:  Health Maintenance  Topic Date Due  . Colon Cancer Screening  12/13/2017  . Flu Shot  06/14/2018  . Tetanus Vaccine  09/21/2020  .  Hepatitis C: One time screening is  recommended by Center for Disease Control  (CDC) for  adults born from [birth year] through 1965.   Completed  . Pneumonia vaccines  Completed     Fat and Cholesterol Restricted Diet High levels of fat and cholesterol in your blood may lead to various health problems, such as diseases of the heart, blood vessels, gallbladder, liver, and pancreas. Fats are concentrated sources of energy that come in various forms. Certain types of fat, including saturated fat, may be harmful in excess. Cholesterol is a substance needed by your body in small amounts. Your body makes all the cholesterol it needs. Excess cholesterol comes from the food you eat. When you have high levels of cholesterol and saturated fat in your blood, health problems can develop because the excess fat and cholesterol will gather along the walls of your blood vessels, causing them to narrow. Choosing the right foods will help you control your intake of fat and cholesterol. This will help keep the levels of these substances in your blood within normal limits and reduce your risk of disease. What is my plan? Your health care provider recommends that you:  Limit your fat intake to ______% or less of your total  calories per day.  Limit the amount of cholesterol in your diet to less than _________mg per day.  Eat 20-30 grams of fiber each day.  What types of fat should I choose?  Choose healthy fats more often. Choose monounsaturated and polyunsaturated fats, such as olive and canola oil, flaxseeds, walnuts, almonds, and seeds.  Eat more omega-3 fats. Good choices include salmon, mackerel, sardines, tuna, flaxseed oil, and ground flaxseeds. Aim to eat fish at least two times a week.  Limit saturated fats. Saturated fats are primarily found in animal products, such as meats, butter, and cream. Plant sources of saturated fats include palm oil, palm kernel oil, and coconut oil.  Avoid foods with partially hydrogenated oils in them. These contain  trans fats. Examples of foods that contain trans fats are stick margarine, some tub margarines, cookies, crackers, and other baked goods. What general guidelines do I need to follow? These guidelines for healthy eating will help you control your intake of fat and cholesterol:  Check food labels carefully to identify foods with trans fats or high amounts of saturated fat.  Fill one half of your plate with vegetables and green salads.  Fill one fourth of your plate with whole grains. Look for the word "whole" as the first word in the ingredient list.  Fill one fourth of your plate with lean protein foods.  Limit fruit to two servings a day. Choose fruit instead of juice.  Eat more foods that contain fiber, such as apples, broccoli, carrots, beans, peas, and barley.  Eat more home-cooked food and less restaurant, buffet, and fast food.  Limit or avoid alcohol.  Limit foods high in starch and sugar.  Limit fried foods.  Cook foods using methods other than frying. Baking, boiling, grilling, and broiling are all great options.  Lose weight if you are overweight. Losing just 5-10% of your initial body weight can help your overall health and prevent diseases such as diabetes and heart disease.  What foods can I eat? Grains  Whole grains, such as whole wheat or whole grain breads, crackers, cereals, and pasta. Unsweetened oatmeal, bulgur, barley, quinoa, or brown rice. Corn or whole wheat flour tortillas. Vegetables  Fresh or frozen vegetables (raw, steamed, roasted, or grilled). Green salads. Fruits  All fresh, canned (in natural juice), or frozen fruits. Meats and other protein foods  Ground beef (85% or leaner), grass-fed beef, or beef trimmed of fat. Skinless chicken or [place]. Ground chicken or [place]. Pork trimmed of fat. All fish and seafood. Eggs. Dried beans, peas, or lentils. Unsalted nuts or seeds. Unsalted canned or dry beans. Dairy  Low-fat dairy products, such as skim  or 1% milk, 2% or reduced-fat cheeses, low-fat ricotta or cottage cheese, or plain low-fat yo Fats and oils  Tub margarines without trans fats. Light or reduced-fat mayonnaise and salad dressings. Avocado. Olive, canola, sesame, or safflower oils. Natural peanut or almond butter (choose ones without added sugar and oil). The items listed above may not be a complete list of recommended foods or beverages. Contact your dietitian for more options. Foods to avoid Grains  White bread. White pasta. White rice. Cornbread. Bagels, pastries, and croissants. Crackers that contain trans fat. Vegetables  White potatoes. Corn. Creamed or fried vegetables. Vegetables in a cheese sauce. Fruits  Dried fruits. Canned fruit in light or heavy syrup. Fruit juice. Meats and other protein foods  Fatty cuts of meat. Ribs, chicken wings, bacon, sausage, bologna, salami, chitterlings, fatback, hot dogs, bratwurst, and packaged  luncheon meats. Liver and organ meats. Dairy  Whole or 2% milk, cream, half-and-half, and cream cheese. Whole milk cheeses. Whole-fat or sweetened yogurt. Full-fat cheeses. Nondairy creamers and whipped toppings. Processed cheese, cheese spreads, or cheese curds. Beverages  Alcohol. Sweetened drinks (such as sodas, lemonade, and fruit drinks or punches). Fats and oils  Butter, stick margarine, lard, shortening, ghee, or bacon fat. Coconut, palm kernel, or palm oils. Sweets and desserts  Corn syrup, sugars, honey, and molasses. Candy. Jam and jelly. Syrup. Sweetened cereals. Cookies, pies, cakes, donuts, muffins, and ice cream. The items listed above may not be a complete list of foods and beverages to avoid. Contact your dietitian for more information. This information is not intended to replace advice given to you by your health care provider. Make sure you discuss any questions you have with your health care provider. Document Released: 10/31/2005 Document Revised: 11/21/2014 Document  Reviewed: 01/29/2014 Elsevier Interactive Patient Education  2018 [place] in the Home Falls can cause injuries. They can happen to people of all ages. There are many things you can do to make your home safe and to help prevent falls. What can I do on the outside of my home?  Regularly fix the edges of walkways and driveways and fix any cracks.  Remove anything that might make you trip as you walk through a door, such as a raised step or threshold.  Trim any bushes or trees on the path to your home.  Use bright outdoor lighting.  Clear any walking paths of anything that might make someone trip, such as rocks or tools.  Regularly check to see if handrails are loose or broken. Make sure that both sides of any steps have handrails.  Any raised decks and porches should have guardrails on the edges.  Have any leaves, snow, or ice cleared regularly.  Use sand or salt on walking paths during winter.  Clean up any spills in your garage right away. This includes oil or grease spills. What can I do in the bathroom?  Use night lights.  Install grab bars by the toilet and in the tub and shower. Do not use towel bars as grab bars.  Use non-skid mats or decals in the tub or shower.  If you need to sit down in the shower, use a plastic, non-slip stool.  Keep the floor dry. Clean up any water that spills on the floor as soon as it happens.  Remove soap buildup in the tub or shower regularly.  Attach bath mats securely with double-sided non-slip rug tape.  Do not have throw rugs and other things on the floor that can make you trip. What can I do in the bedroom?  Use night lights.  Make sure that you have a light by your bed that is easy to reach.  Do not use any sheets or blankets that are too big for your bed. They should not hang down onto the floor.  Have a firm chair that has side arms. You can use this for support while you get dressed.  Do not have  throw rugs and other things on the floor that can make you trip. What can I do in the kitchen?  Clean up any spills right away.  Avoid walking on wet floors.  Keep items that you use a lot in easy-to-reach places.  If you need to reach something above you, use a strong step stool that has a grab bar.  Keep  electrical cords out of the way.  Do not use floor polish or wax that makes floors slippery. If you must use wax, use non-skid floor wax.  Do not have throw rugs and other things on the floor that can make you trip. What can I do with my stairs?  Do not leave any items on the stairs.  Make sure that there are handrails on both sides of the stairs and use them. Fix handrails that are broken or loose. Make sure that handrails are as long as the stairways.  Check any carpeting to make sure that it is firmly attached to the stairs. Fix any carpet that is loose or worn.  Avoid having throw rugs at the top or bottom of the stairs. If you do have throw rugs, attach them to the floor with carpet tape.  Make sure that you have a light switch at the top of the stairs and the bottom of the stairs. If you do not have them, ask someone to add them for you. What else can I do to help prevent falls?  Wear shoes that: ? Do not have high heels. ? Have rubber bottoms. ? Are comfortable and fit you well. ? Are closed at the toe. Do not wear sandals.  If you use a stepladder: ? Make sure that it is fully opened. Do not climb a closed stepladder. ? Make sure that both sides of the stepladder are locked into place. ? Ask someone to hold it for you, if possible.  Clearly mark and make sure that you can see: ? Any grab bars or handrails. ? First and last steps. ? Where the edge of each step is.  Use tools that help you move around (mobility aids) if they are needed. These include: ? Canes. ? Walkers. ? Scooters. ? Crutches.  Turn on the lights when you go into a dark area. Replace any  light bulbs as soon as they burn out.  Set up your furniture so you have a clear path. Avoid moving your furniture around.  If any of your floors are uneven, fix them.  If there are any pets around you, be aware of where they are.  Review your medicines with your doctor. Some medicines can make you feel dizzy. This can increase your chance of falling. Ask your doctor what other things that you can do to help prevent falls. This information is not intended to replace advice given to you by your health care provider. Make sure you discuss any questions you have with your health care provider. Document Released: 08/27/2009 Document Revised: 04/07/2016 Document Reviewed: 12/05/2014 Elsevier Interactive Patient Education  2018 [place] Maintenance, Male A healthy lifestyle and preventive care is important for your health and wellness. Ask your health care provider about what schedule of regular examinations is right for you. What should I know about weight and diet? Eat a Healthy Diet  Eat plenty of vegetables, fruits, whole grains, low-fat dairy products, and lean protein.  Do not eat a lot of foods high in solid fats, added sugars, or salt.  Maintain a Healthy Weight Regular exercise can help you achieve or maintain a healthy weight. You should:  Do at least 150 minutes of exercise each week. The exercise should increase your heart rate and make you sweat (moderate-intensity exercise).  Do strength-training exercises at least twice a week.  Watch Your Levels of Cholesterol and Blood Lipids  Have your blood tested for lipids and cholesterol  every 5 years starting at 71 years of age. If you are at high risk for heart disease, you should start having your blood tested when you are 71 years old. You may need to have your cholesterol levels checked more often if: ? Your lipid or cholesterol levels are high. ? You are older than 71 years of age. ? You are at high risk for  heart disease.  What should I know about cancer screening? Many types of cancers can be detected early and may often be prevented. Lung Cancer  You should be screened every year for lung cancer if: ? You are a current smoker who has smoked for at least 30 years. ? You are a former smoker who has quit within the past 15 years.  Talk to your health care provider about your screening options, when you should start screening, and how often you should be screened.  Colorectal Cancer  Routine colorectal cancer screening usually begins at 71 years of age and should be repeated every 5-10 years until you are 70 years old. You may need to be screened more often if early forms of precancerous polyps or small growths are found. Your health care provider may recommend screening at an earlier age if you have risk factors for colon cancer.  Your health care provider may recommend using home test kits to check for hidden blood in the stool.  A small camera at the end of a tube can be used to examine your colon (sigmoidoscopy or colonoscopy). This checks for the earliest forms of colorectal cancer.  Prostate and Testicular Cancer  Depending on your age and overall health, your health care provider may do certain tests to screen for prostate and testicular cancer.  Talk to your health care provider about any symptoms or concerns you have about testicular or prostate cancer.  Skin Cancer  Check your skin from head to toe regularly.  Tell your health care provider about any new moles or changes in moles, especially if: ? There is a change in a mole's size, shape, or color. ? You have a mole that is larger than a pencil eraser.  Always use sunscreen. Apply sunscreen liberally and repeat throughout the day.  Protect yourself by wearing long sleeves, pants, a wide-brimmed hat, and sunglasses when outside.  What should I know about heart disease, diabetes, and high blood pressure?  If you are 73-86  years of age, have your blood pressure checked every 3-5 years. If you are 48 years of age or older, have your blood pressure checked every year. You should have your blood pressure measured twice-once when you are at a hospital or clinic, and once when you are not at a hospital or clinic. Record the average of the two measurements. To check your blood pressure when you are not at a hospital or clinic, you can use: ? An automated blood pressure machine at a pharmacy. ? A home blood pressure monitor.  Talk to your health care provider about your target blood pressure.  If you are between 47-10 years old, ask your health care provider if you should take aspirin to prevent heart disease.  Have regular diabetes screenings by checking your fasting blood sugar level. ? If you are at a normal weight and have a low risk for diabetes, have this test once every three years after the age of 49. ? If you are overweight and have a high risk for diabetes, consider being tested at a younger age or  more often.  A one-time screening for abdominal aortic aneurysm (AAA) by ultrasound is recommended for men aged 54-75 years who are current or former smokers. What should I know about preventing infection? Hepatitis B If you have a higher risk for hepatitis B, you should be screened for this virus. Talk with your health care provider to find out if you are at risk for hepatitis B infection. Hepatitis C Blood testing is recommended for:  Everyone born from [birth year] through 1964.  Anyone with known risk factors for hepatitis C.  Sexually Transmitted Diseases (STDs)  You should be screened each year for STDs including gonorrhea and chlamydia if: ? You are sexually active and are younger than 71 years of age. ? You are older than 71 years of age and your health care provider tells you that you are at risk for this type of infection. ? Your sexual activity has changed since you were last screened and you are at an  increased risk for chlamydia or gonorrhea. Ask your health care provider if you are at risk.  Talk with your health care provider about whether you are at high risk of being infected with HIV. Your health care provider may recommend a prescription medicine to help prevent HIV infection.  What else can I do?  Schedule regular health, dental, and eye exams.  Stay current with your vaccines (immunizations).  Do not use any tobacco products, such as cigarettes, chewing tobacco, and e-cigarettes. If you need help quitting, ask your health care provider.  Limit alcohol intake to no more than 2 drinks per day. One drink equals 12 ounces of beer, 5 ounces of wine, or 1 ounces of hard liquor.  Do not use street drugs.  Do not share needles.  Ask your health care provider for help if you need support or information about quitting drugs.  Tell your health care provider if you often feel depressed.  Tell your health care provider if you have ever been abused or do not feel safe at home. This information is not intended to replace advice given to you by your health care provider. Make sure you discuss any questions you have with your health care provider. Document Released: 04/28/2008 Document Revised: 06/29/2016 Document Reviewed: 08/04/2015 Elsevier Interactive Patient Education  [employer].

## 2018-03-02 NOTE — Progress Notes (Signed)
Your PSA/prostate test came down by nearly a point which is great news.  I think with this decrease we can go back to following it once a year as long as it does not jump up again.

## 2018-03-02 NOTE — Progress Notes (Signed)
I have reviewed and agree with note, evaluation, plan.  I would hold off on sugars until pain has resolved.  We will see him in early May.  Glad he has good support from family after loss of his wife  Garret Reddish, MD

## 2018-03-06 ENCOUNTER — Encounter: Payer: Self-pay | Admitting: Gastroenterology

## 2018-03-07 ENCOUNTER — Encounter: Payer: Self-pay | Admitting: Gastroenterology

## 2018-03-07 ENCOUNTER — Ambulatory Visit (AMBULATORY_SURGERY_CENTER): Payer: Medicare HMO | Admitting: Gastroenterology

## 2018-03-07 ENCOUNTER — Other Ambulatory Visit: Payer: Self-pay

## 2018-03-07 VITALS — BP 121/82 | HR 58 | Temp 97.5°F | Resp 14 | Ht 68.0 in | Wt 206.0 lb

## 2018-03-07 DIAGNOSIS — K5150 Left sided colitis without complications: Secondary | ICD-10-CM

## 2018-03-07 DIAGNOSIS — D125 Benign neoplasm of sigmoid colon: Secondary | ICD-10-CM | POA: Diagnosis not present

## 2018-03-07 DIAGNOSIS — K635 Polyp of colon: Secondary | ICD-10-CM

## 2018-03-07 DIAGNOSIS — D122 Benign neoplasm of ascending colon: Secondary | ICD-10-CM | POA: Diagnosis not present

## 2018-03-07 DIAGNOSIS — Z8719 Personal history of other diseases of the digestive system: Secondary | ICD-10-CM | POA: Diagnosis not present

## 2018-03-07 DIAGNOSIS — Z1211 Encounter for screening for malignant neoplasm of colon: Secondary | ICD-10-CM | POA: Diagnosis not present

## 2018-03-07 DIAGNOSIS — Z86010 Personal history of colonic polyps: Secondary | ICD-10-CM

## 2018-03-07 MED ORDER — SODIUM CHLORIDE 0.9 % IV SOLN: 500.0000 mL | Freq: Once | INTRAVENOUS | Status: DC

## 2018-03-07 NOTE — Progress Notes (Signed)
Called to room to assist during endoscopic procedure.  Patient ID and intended procedure confirmed with present staff. Received instructions for my participation in the procedure from the performing physician.  

## 2018-03-07 NOTE — Op Note (Signed)
[place] Patient Name: [name] Procedure Date: 03/07/2018 2:57 PM MRN: [id number] Endoscopist: Ladene Artist , MD Age: 72 Referring MD:  Date of Birth: June 02, 1946 Gender: Male Account #: [id number] Procedure:                Colonoscopy Indications:              Surveillance: Personal history of adenomatous                            polyps on last colonoscopy 5 years ago. History of                            left sided colitis. Medicines:                Monitored Anesthesia Care Procedure:                Pre-Anesthesia Assessment:                           - Prior to the procedure, a History and Physical                            was performed, and patient medications and                            allergies were reviewed. The patient's tolerance of                            previous anesthesia was also reviewed. The risks                            and benefits of the procedure and the sedation                            options and risks were discussed with the patient.                            All questions were answered, and informed consent                            was obtained. Prior Anticoagulants: The patient has                            taken Plavix (clopidogrel), last dose was 5 days                            prior to procedure. ASA Grade Assessment: III - A                            patient with severe systemic disease. After                            reviewing the risks and benefits, the patient was  deemed in satisfactory condition to undergo the                            procedure.                           After obtaining informed consent, the colonoscope                            was passed under direct vision. Throughout the                            procedure, the patient's blood pressure, pulse, and                            oxygen saturations were monitored continuously. The   Colonoscope was introduced through the anus and                            advanced to the the cecum, identified by                            appendiceal orifice and ileocecal valve. The                            ileocecal valve, appendiceal orifice, and rectum                            were photographed. The quality of the bowel                            preparation was good. The colonoscopy was performed                            without difficulty. The patient tolerated the                            procedure well. Scope In: 3:10:24 PM Scope Out: 3:23:09 PM Scope Withdrawal Time: 0 hours 9 minutes 54 seconds  Total Procedure Duration: 0 hours 12 minutes 45 seconds  Findings:                 The perianal and digital rectal examinations were                            normal.                           Two sessile polyps were found in the sigmoid colon                            and ascending colon. The polyps were 6 to 7 mm in                            size. These polyps were removed with a cold snare.  Resection and retrieval were complete.                           The exam was otherwise without abnormality on                            direct and retroflexion views. Random biopsies. Complications:            No immediate complications. Estimated blood loss:                            None. Estimated Blood Loss:     Estimated blood loss: none. Impression:               - Two 6 to 7 mm polyps in the sigmoid colon and in                            the ascending colon, removed with a cold snare.                            Resected and retrieved.                           - The examination was otherwise normal on direct                            and retroflexion views. Recommendation:           - Repeat colonoscopy in 3 - 5 years for                            surveillance pending pathology review.                           - Resume Plavix (clopidogrel)  tomorrow at prior                            dose. Refer to managing physician for further                            adjustment of therapy.                           - Patient has a contact number available for                            emergencies. The signs and symptoms of potential                            delayed complications were discussed with the                            patient. Return to normal activities tomorrow.                            Written discharge instructions were provided  to the                            patient.                           - Resume previous diet.                           - Continue present medications.                           - Await pathology results. Ladene Artist, MD 03/07/2018 3:27:07 PM This report has been signed electronically.

## 2018-03-07 NOTE — Progress Notes (Signed)
A and O x3. Report to RN. Tolerated MAC anesthesia well.

## 2018-03-07 NOTE — Progress Notes (Signed)
Patient takes Plavix, last dose on 03/01/18

## 2018-03-07 NOTE — Patient Instructions (Signed)
HANDOUTS GIVEN FOR POLYPS  YOU MAY RESUME YOUR PLAVIX TOMORROW AT YOUR PRIOR DOSE.  YOU HAD AN ENDOSCOPIC PROCEDURE TODAY AT [facility]:   Refer to the procedure report that was given to you for any specific questions about what was found during the examination.  If the procedure report does not answer your questions, please call your gastroenterologist to clarify.  If you requested that your care partner not be given the details of your procedure findings, then the procedure report has been included in a sealed envelope for you to review at your convenience later.  YOU SHOULD EXPECT: Some feelings of bloating in the abdomen. Passage of more gas than usual.  Walking can help get rid of the air that was put into your GI tract during the procedure and reduce the bloating. If you had a lower endoscopy (such as a colonoscopy or flexible sigmoidoscopy) you may notice spotting of blood in your stool or on the toilet paper. If you underwent a bowel prep for your procedure, you may not have a normal bowel movement for a few days.  Please Note:  You might notice some irritation and congestion in your nose or some drainage.  This is from the oxygen used during your procedure.  There is no need for concern and it should clear up in a day or so.  SYMPTOMS TO REPORT IMMEDIATELY:   Following lower endoscopy (colonoscopy or flexible sigmoidoscopy):  Excessive amounts of blood in the stool  Significant tenderness or worsening of abdominal pains  Swelling of the abdomen that is new, acute  Fever of 100F or higher   For urgent or emergent issues, a gastroenterologist can be reached at any hour by calling [phone].   DIET:  We do recommend a small meal at first, but then you may proceed to your regular diet.  Drink plenty of fluids but you should avoid alcoholic beverages for 24 hours.  ACTIVITY:  You should plan to take it easy for the rest of today and you should NOT DRIVE or use  heavy machinery until tomorrow (because of the sedation medicines used during the test).    FOLLOW UP: Our staff will call the number listed on your records the next business day following your procedure to check on you and address any questions or concerns that you may have regarding the information given to you following your procedure. If we do not reach you, we will leave a message.  However, if you are feeling well and you are not experiencing any problems, there is no need to return our call.  We will assume that you have returned to your regular daily activities without incident.  If any biopsies were taken you will be contacted by phone or by letter within the next 1-3 weeks.  Please call us at [phone] if you have not heard about the biopsies in 3 weeks.    SIGNATURES/CONFIDENTIALITY: You and/or your care partner have signed paperwork which will be entered into your electronic medical record.  These signatures attest to the fact that that the information above on your After Visit Summary has been reviewed and is understood.  Full responsibility of the confidentiality of this discharge information lies with you and/or your care-partner.

## 2018-03-08 ENCOUNTER — Telehealth: Payer: Self-pay

## 2018-03-08 NOTE — Telephone Encounter (Signed)
Attempted to reach pt. With follow-up call following endoscopic procedure 03/07/2018.  LM on pt. Ans. Machine to call if he has any questions or concerns.

## 2018-03-08 NOTE — Telephone Encounter (Signed)
Patient states he has no questions or concerns from procedure yesterday.

## 2018-03-08 NOTE — Telephone Encounter (Signed)
Left message for f/u call on cell phone.  Will attempt to call again this afternoon.

## 2018-03-14 ENCOUNTER — Other Ambulatory Visit: Payer: Self-pay | Admitting: Internal Medicine

## 2018-03-18 ENCOUNTER — Encounter: Payer: Self-pay | Admitting: Gastroenterology

## 2018-03-19 ENCOUNTER — Ambulatory Visit: Payer: Medicare HMO | Admitting: Family Medicine

## 2018-03-19 ENCOUNTER — Encounter: Payer: Self-pay | Admitting: Family Medicine

## 2018-03-19 DIAGNOSIS — K219 Gastro-esophageal reflux disease without esophagitis: Secondary | ICD-10-CM | POA: Diagnosis not present

## 2018-03-19 DIAGNOSIS — Z86010 Personal history of colonic polyps: Secondary | ICD-10-CM | POA: Diagnosis not present

## 2018-03-19 DIAGNOSIS — E785 Hyperlipidemia, unspecified: Secondary | ICD-10-CM

## 2018-03-19 DIAGNOSIS — B0229 Other postherpetic nervous system involvement: Secondary | ICD-10-CM | POA: Diagnosis not present

## 2018-03-19 DIAGNOSIS — N183 Chronic kidney disease, stage 3 (moderate): Secondary | ICD-10-CM | POA: Diagnosis not present

## 2018-03-19 DIAGNOSIS — I10 Essential (primary) hypertension: Secondary | ICD-10-CM

## 2018-03-19 NOTE — Progress Notes (Signed)
Subjective:  [name] is a 72 y.o. year old very pleasant male patient who presents for/with See problem oriented charting ROS- No chest pain or shortness of breath. No headache or blurry vision.  Does have some left lower quadrant pain into his back at site of prior shingles  Past Medical History-  Patient Active Problem List   Diagnosis Date Noted  . ULCERATIVE COLITIS-LEFT SIDE 07/03/2008    Priority: High  . CAD (coronary artery disease) 06/15/2007    Priority: High  . Hyperglycemia 06/08/2015    Priority: Medium  . CKD (chronic kidney disease), stage III ([place]) 10/24/2014    Priority: Medium  . Sleep apnea 07/02/2008    Priority: Medium  . Hyperlipidemia 06/14/2007    Priority: Medium  . Essential hypertension 06/14/2007    Priority: Medium  . Postherpetic neuralgia 03/19/2018    Priority: Low  . Balanitis 03/06/2017    Priority: Low  . OA (osteoarthritis) of knee 12/07/2015    Priority: Low  . Former smoker 06/08/2015    Priority: Low  . Edema 02/04/2015    Priority: Low  . Allergic rhinitis 10/24/2014    Priority: Low  . Obesity (BMI 30-39.9) 04/25/2014    Priority: Low  . Right cervical radiculopathy 04/12/2013    Priority: Low  . Low back pain 01/11/2008    Priority: Low  . GERD 11/07/2007    Priority: Low  . Trigger finger, acquired 10/23/2007    Priority: Low  . History of colonic polyps 06/14/2007    Priority: Low    Medications- reviewed and updated Current Outpatient Medications  Medication Sig Dispense Refill  . atenolol (TENORMIN) 25 MG tablet Take 0.5 tablets (12.5 mg total) by mouth daily. 45 tablet 3  . atorvastatin (LIPITOR) 10 MG tablet TAKE 1 TABLET EVERY DAY 90 tablet 3  . clopidogrel (PLAVIX) 75 MG tablet TAKE 1 TABLET EVERY DAY 90 tablet 3  . furosemide (LASIX) 20 MG tablet Take 1 tablet (20 mg total) by mouth daily as needed for edema. 30 tablet 6  . gabapentin (NEURONTIN) 300 MG capsule Take qd on day 1. Bid on day 2. Tid on  day 3 and beyond. 90 capsule 3  . irbesartan-hydrochlorothiazide (AVALIDE) 150-12.5 MG tablet Take 1 tablet by mouth daily. 90 tablet 3  . Lidocaine-Capsaicin 4-0.1 % CREA Apply 1 application topically 3 (three) times daily. 60 g 1  . mercaptopurine (PURINETHOL) 50 MG tablet TAKE 1 AND 1/2 TABLETS BY MOUTH DAILY. GIVE ON AN EMPTY STOMACH 1 HOUR BEFORE OR 2 HOURS AFTER A MEAL. 45 tablet 5  . nitroGLYCERIN (NITROSTAT) 0.4 MG SL tablet Place 1 tablet (0.4 mg total) under the tongue every 5 (five) minutes as needed for chest pain. 20 tablet 5  . potassium chloride (K-DUR,KLOR-CON) 10 MEQ tablet Take 1 tablet (10 mEq total) by mouth daily. 90 tablet 2  . ranitidine (ZANTAC) 150 MG capsule Take 150 mg by mouth 2 (two) times daily.    . tamsulosin (FLOMAX) 0.4 MG CAPS capsule Take 0.4 mg by mouth daily.      No current facility-administered medications for this visit.     Objective: BP 132/84 (BP Location: Left Arm, Patient Position: Sitting, Cuff Size: Normal)   Pulse (!) 55   Ht 5' 8"  (1.727 m)   Wt 205 lb 12.8 oz (93.4 kg)   SpO2 98%   BMI 31.29 kg/m  Gen: NAD, resting comfortably CV: Slightly bradycardic but regular no murmurs rubs or gallops Lungs:  CTAB no crackles, wheeze, rhonchi Abdomen: soft/nontender/nondistended/normal bowel sounds. No rebound or guarding.  He is tender to touch though along the band/dermatome where he had a shingles Ext: no edema Skin: warm, dry Neuro: Normal gait and speech  Assessment/Plan:  Other notes: 1.cystoscopy and CT scan with urology for microscopic hematuria. PSA dropped back down with urology.  2.  Wants Shingrix vaccination.  We discussed doing this once neuralgia had resolved 3.  Continues to follow with cardiology and was just seen in March  Postherpetic neuralgia S: Shingles back in December.  He continues to take gabapentin 1-2 times a day.  Pain overall improved but still persistent in left lower abdomen and into back A/P: continue current  medications  GERD  S: Acid reflux has been controlled on Zantac alone.  We wanted to get him off of his proton pump inhibitor due to CKD 3 A/P:continue current medicine- better long term risk  Essential hypertension S: controlled on current medications (atenolol 25 mg, Lasix 20 mg only as needed for edema, irbesartan-hydrochlorothiazide 150-12.5 mg daily) atenolol was decreased by 50% by Dr. Harrington Challenger. BP Readings from Last 3 Encounters:  03/19/18 132/84  03/07/18 121/82  03/01/18 138/78  A/P: We discussed blood pressure goal of <140/90. Continue current meds  Hyperlipidemia S:  controlled on atorvastatin 10 mg at bedtime with last LDL at 57 under goal of 70  A/P: Continue current medications   Future Appointments  Date Time Provider [place]  03/07/2019  1:00 PM Williemae Area, RN [facility]   Return in about 6 months (around 09/19/2018) for physical. come fasting. get morning appointment.  Lab/Order associations: Gastroesophageal reflux disease, esophagitis presence not specified  Essential hypertension  Hyperlipidemia, unspecified hyperlipidemia type  Postherpetic neuralgia  History of colonic polyps  No orders of the defined types were placed in this encounter.   Return precautions advised.  Garret Reddish, MD

## 2018-03-19 NOTE — Assessment & Plan Note (Signed)
S: Shingles back in December.  He continues to take gabapentin 1-2 times a day.  Pain overall improved but still persistent in left lower abdomen and into back A/P: continue current medications

## 2018-03-19 NOTE — Assessment & Plan Note (Signed)
S: Acid reflux has been controlled on Zantac alone.  We wanted to get him off of his proton pump inhibitor due to CKD 3 A/P:continue current medicine- better long term risk

## 2018-03-19 NOTE — Assessment & Plan Note (Signed)
S:  controlled on atorvastatin 10 mg at bedtime with last LDL at 57 under goal of 70  A/P: Continue current medications

## 2018-03-19 NOTE — Patient Instructions (Addendum)
No changes today  Glad you are doing better and things have calmed down

## 2018-03-19 NOTE — Assessment & Plan Note (Signed)
S: controlled on current medications (atenolol 25 mg, Lasix 20 mg only as needed for edema, irbesartan-hydrochlorothiazide 150-12.5 mg daily) atenolol was decreased by 50% by Dr. Harrington Challenger. BP Readings from Last 3 Encounters:  03/19/18 132/84  03/07/18 121/82  03/01/18 138/78  A/P: We discussed blood pressure goal of <140/90. Continue current meds

## 2018-03-22 DIAGNOSIS — G4733 Obstructive sleep apnea (adult) (pediatric): Secondary | ICD-10-CM | POA: Diagnosis not present

## 2018-04-04 ENCOUNTER — Other Ambulatory Visit: Payer: Self-pay | Admitting: Family Medicine

## 2018-05-21 ENCOUNTER — Other Ambulatory Visit: Payer: Self-pay | Admitting: Family Medicine

## 2018-06-04 DIAGNOSIS — H43812 Vitreous degeneration, left eye: Secondary | ICD-10-CM | POA: Diagnosis not present

## 2018-06-04 DIAGNOSIS — H04223 Epiphora due to insufficient drainage, bilateral lacrimal glands: Secondary | ICD-10-CM | POA: Diagnosis not present

## 2018-06-04 DIAGNOSIS — H538 Other visual disturbances: Secondary | ICD-10-CM | POA: Diagnosis not present

## 2018-06-04 DIAGNOSIS — H2511 Age-related nuclear cataract, right eye: Secondary | ICD-10-CM | POA: Diagnosis not present

## 2018-07-17 ENCOUNTER — Encounter: Payer: Self-pay | Admitting: Family Medicine

## 2018-07-17 ENCOUNTER — Ambulatory Visit (INDEPENDENT_AMBULATORY_CARE_PROVIDER_SITE_OTHER): Payer: Medicare HMO | Admitting: Family Medicine

## 2018-07-17 VITALS — BP 130/80 | HR 54 | Temp 98.1°F | Ht 68.0 in | Wt 207.5 lb

## 2018-07-17 DIAGNOSIS — R21 Rash and other nonspecific skin eruption: Secondary | ICD-10-CM | POA: Diagnosis not present

## 2018-07-17 DIAGNOSIS — I2510 Atherosclerotic heart disease of native coronary artery without angina pectoris: Secondary | ICD-10-CM | POA: Diagnosis not present

## 2018-07-17 MED ORDER — TRIAMCINOLONE ACETONIDE 0.5 % EX OINT: 1.0000 "application " | TOPICAL_OINTMENT | Freq: Two times a day (BID) | CUTANEOUS | 0 refills | Status: DC

## 2018-07-17 NOTE — Patient Instructions (Signed)
Unclear cause of rash but with itchiness and vesicles it is acting like you are having a reaction to something you got in contact with. Lets try a strong steroid cream for 10 days. If you are having a lot more lesions pop up or if its not improving by later this week see [place] back.   If you have fever, chills, get to feeling worse see [place] back immediately

## 2018-07-17 NOTE — Progress Notes (Addendum)
Subjective:  [name] is a 72 y.o. year old very pleasant male patient who presents for/with See problem oriented charting ROS- see ros below   Past Medical History-  Patient Active Problem List   Diagnosis Date Noted  . ULCERATIVE COLITIS-LEFT SIDE 07/03/2008    Priority: High  . CAD (coronary artery disease) 06/15/2007    Priority: High  . Hyperglycemia 06/08/2015    Priority: Medium  . CKD (chronic kidney disease), stage III ([place]) 10/24/2014    Priority: Medium  . Sleep apnea 07/02/2008    Priority: Medium  . Hyperlipidemia 06/14/2007    Priority: Medium  . Essential hypertension 06/14/2007    Priority: Medium  . History of colonic polyps 06/14/2007    Priority: Medium  . Postherpetic neuralgia 03/19/2018    Priority: Low  . Balanitis 03/06/2017    Priority: Low  . OA (osteoarthritis) of knee 12/07/2015    Priority: Low  . Former smoker 06/08/2015    Priority: Low  . Edema 02/04/2015    Priority: Low  . Allergic rhinitis 10/24/2014    Priority: Low  . Obesity (BMI 30-39.9) 04/25/2014    Priority: Low  . Right cervical radiculopathy 04/12/2013    Priority: Low  . Low back pain 01/11/2008    Priority: Low  . GERD 11/07/2007    Priority: Low  . Trigger finger, acquired 10/23/2007    Priority: Low    Medications- reviewed and updated Current Outpatient Medications  Medication Sig Dispense Refill  . atenolol (TENORMIN) 25 MG tablet Take 0.5 tablets (12.5 mg total) by mouth daily. 45 tablet 3  . atorvastatin (LIPITOR) 10 MG tablet TAKE 1 TABLET EVERY DAY 90 tablet 3  . clopidogrel (PLAVIX) 75 MG tablet TAKE 1 TABLET EVERY DAY 90 tablet 3  . fluticasone (FLONASE) 50 MCG/ACT nasal spray INHALE 1 SPRAY INTO EACH NOSTRIL ONCE DAILY. 16 g 2  . furosemide (LASIX) 20 MG tablet Take 1 tablet (20 mg total) by mouth daily as needed for edema. 30 tablet 6  . gabapentin (NEURONTIN) 300 MG capsule Take qd on day 1. Bid on day 2. Tid on day 3 and beyond. 90 capsule 3    . irbesartan-hydrochlorothiazide (AVALIDE) 150-12.5 MG tablet TAKE 1 TABLET EVERY DAY 90 tablet 3  . mercaptopurine (PURINETHOL) 50 MG tablet TAKE 1 AND 1/2 TABLETS BY MOUTH DAILY. GIVE ON AN EMPTY STOMACH 1 HOUR BEFORE OR 2 HOURS AFTER A MEAL. 45 tablet 5  . nitroGLYCERIN (NITROSTAT) 0.4 MG SL tablet Place 1 tablet (0.4 mg total) under the tongue every 5 (five) minutes as needed for chest pain. 20 tablet 5  . potassium chloride (K-DUR,KLOR-CON) 10 MEQ tablet Take 1 tablet (10 mEq total) by mouth daily. 90 tablet 2  . ranitidine (ZANTAC) 150 MG capsule Take 150 mg by mouth 2 (two) times daily.    . tamsulosin (FLOMAX) 0.4 MG CAPS capsule Take 0.4 mg by mouth daily.     Marland Kitchen triamcinolone ointment (KENALOG) 0.5 % Apply 1 application topically 2 (two) times daily. For 10 days 30 g 0   No current facility-administered medications for this visit.     Objective: BP 130/80 (BP Location: Left Arm, Patient Position: Sitting, Cuff Size: Large)   Pulse (!) 54   Temp 98.1 F (36.7 C) (Oral)   Ht 5' 8"  (1.727 m)   Wt 207 lb 8 oz (94.1 kg)   SpO2 99%   BMI 31.55 kg/m  Gen: NAD, resting comfortably CV: RRR  Lungs:  nonlabored, normal respiratory rate Abdomen: soft/nondistended Ext: no edema Skin: warm, dry, on left foot there is almost a strip on lateral portion of foot of vesicles. Also has several various lesions on medial side of foot. Not painful to palpation. No warmth over foot. Nothing above the ankle.   No rash on right foot      Assessment/Plan:  Rash S:5 days ago noted 2-3 spots on the left foot and then seemed to spread each day but no new spots since yesterday. Small vesicles. Very itchy. Denies pain. Appears over multiple dermatome distributions. Denies new contacts. Denies new fabric softeners, socks, laundry detergents.  ROS-not ill appearing, no fever/chills. No new medications. Not immunocompromised. No mucus membrane involvement.  A/P: Vesicular rash that seems to have linear  streaks but then again distributed along multiple dermatomes. Doubt shingles. Could be contact dermatitis- trial high potency steroid BID for 10 days- if not improving or worsen could consider derm referral or biopsy. With distribution on top of foot and away from webbed spaces doubt vesicular athlete's foot.   CAD (coronary artery disease) S: chest pain free. Has upcoming dental procedure. 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    Future Appointments  Date Time Provider [place]  10/10/2018  8:15 AM Marin Olp, MD [facility]  03/07/2019  1:00 PM [facility] CO[facility]   Meds ordered this encounter  Medications  . triamcinolone ointment (KENALOG) 0.5 %    Sig: Apply 1 application topically 2 (two) times daily. For 10 days    Dispense:  30 g    Refill:  0   Return precautions advised.  Garret Reddish, MD

## 2018-07-17 NOTE — Assessment & Plan Note (Signed)
S: chest pain free. Has upcoming dental procedure. 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

## 2018-07-26 ENCOUNTER — Other Ambulatory Visit: Payer: Self-pay | Admitting: Gastroenterology

## 2018-08-01 ENCOUNTER — Other Ambulatory Visit: Payer: Self-pay | Admitting: Family Medicine

## 2018-09-25 ENCOUNTER — Telehealth: Payer: Self-pay | Admitting: Gastroenterology

## 2018-09-25 DIAGNOSIS — G4733 Obstructive sleep apnea (adult) (pediatric): Secondary | ICD-10-CM | POA: Diagnosis not present

## 2018-09-25 MED ORDER — MERCAPTOPURINE 50 MG PO TABS: ORAL_TABLET | ORAL | 0 refills | Status: DC

## 2018-09-25 NOTE — Telephone Encounter (Signed)
Informed patient he is due for follow up visit. Patient states he was told that we do not have openings. Scheduled patient to see Ellouise Newer, PA on 10/02/18 at 3:15pm. Informed patient that I will send his medication to the pharmacy but to keep appt for further refills. Patient verbalized understanding.

## 2018-10-02 ENCOUNTER — Ambulatory Visit: Payer: Medicare HMO | Admitting: Physician Assistant

## 2018-10-02 ENCOUNTER — Encounter: Payer: Self-pay | Admitting: Physician Assistant

## 2018-10-02 ENCOUNTER — Other Ambulatory Visit (INDEPENDENT_AMBULATORY_CARE_PROVIDER_SITE_OTHER): Payer: Medicare HMO

## 2018-10-02 VITALS — BP 126/76 | HR 60 | Ht 68.0 in | Wt 209.3 lb

## 2018-10-02 DIAGNOSIS — K51919 Ulcerative colitis, unspecified with unspecified complications: Secondary | ICD-10-CM

## 2018-10-02 DIAGNOSIS — K5150 Left sided colitis without complications: Secondary | ICD-10-CM | POA: Diagnosis not present

## 2018-10-02 LAB — CBC WITH DIFFERENTIAL/PLATELET
BASOS PCT: 1 % (ref 0.0–3.0)
Basophils Absolute: 0.1 10*3/uL (ref 0.0–0.1)
Eosinophils Absolute: 0.2 10*3/uL (ref 0.0–0.7)
Eosinophils Relative: 2.6 % (ref 0.0–5.0)
HEMATOCRIT: 46.2 % (ref 39.0–52.0)
HEMOGLOBIN: 15.6 g/dL (ref 13.0–17.0)
LYMPHS PCT: 25.4 % (ref 12.0–46.0)
Lymphs Abs: 2.1 10*3/uL (ref 0.7–4.0)
MCHC: 33.8 g/dL (ref 30.0–36.0)
MCV: 88.7 fl (ref 78.0–100.0)
MONOS PCT: 10.3 % (ref 3.0–12.0)
Monocytes Absolute: 0.8 10*3/uL (ref 0.1–1.0)
Neutro Abs: 5 10*3/uL (ref 1.4–7.7)
Neutrophils Relative %: 60.7 % (ref 43.0–77.0)
Platelets: 239 10*3/uL (ref 150.0–400.0)
RBC: 5.21 Mil/uL (ref 4.22–5.81)
RDW: 15.7 % — AB (ref 11.5–15.5)
WBC: 8.2 10*3/uL (ref 4.0–10.5)

## 2018-10-02 LAB — COMPREHENSIVE METABOLIC PANEL
ALBUMIN: 3.9 g/dL (ref 3.5–5.2)
ALT: 24 U/L (ref 0–53)
AST: 17 U/L (ref 0–37)
Alkaline Phosphatase: 61 U/L (ref 39–117)
BUN: 13 mg/dL (ref 6–23)
CALCIUM: 9.7 mg/dL (ref 8.4–10.5)
CO2: 31 mEq/L (ref 19–32)
Chloride: 100 mEq/L (ref 96–112)
Creatinine, Ser: 1.2 mg/dL (ref 0.40–1.50)
GFR: 63.36 mL/min (ref >60.00)
Glucose, Bld: 101 mg/dL — ABNORMAL HIGH (ref 70–99)
POTASSIUM: 3.7 meq/L (ref 3.5–5.1)
SODIUM: 138 meq/L (ref 135–145)
Total Bilirubin: 0.5 mg/dL (ref 0.2–1.2)
Total Protein: 7.2 g/dL (ref 6.0–8.3)

## 2018-10-02 LAB — LIPASE: Lipase: 23 U/L (ref 11.0–59.0)

## 2018-10-02 MED ORDER — MERCAPTOPURINE 50 MG PO TABS: ORAL_TABLET | ORAL | 5 refills | Status: DC

## 2018-10-02 NOTE — Progress Notes (Signed)
Chief Complaint: Follow-up ulcerative colitis  HPI:    [name] is a 72 year old male with a past medical history of left-sided ulcerative colitis and a personal history of precancerous colon polyps, who follows with Dr. Fuller Plan and returns to clinic today for follow-up.    02/06/2018 last office visit where he discussed that his wife had recently passed away with stage IV renal cancer in January, he had an unintentional 20 pound weight loss related to this.  He had no gastrointestinal complaints.  CBC, CMP and lipase were ordered and all normal.  Patient was continued on his Mercaptopurine 75 mg daily and scheduled for his surveillance colonoscopy.     03/07/2018 colonoscopy with two 6-7 mm polyps in the sigmoid and descending colon, pathology showed adenomatous tests.  Repeat recommended in 3 years.    Today, patient presents to clinic and explains that he is doing well.  He did have one day last week where he woke up in the morning and had 3 loose stools in a row but has had no further symptoms since or before then.  He continues with his Mercaptopurine 75 mg daily.    Does tell me today that he stopped his Zantac and switched to Pepcid 20 mg daily when he heard all the reports on the TV but does not have any problems with breakthrough reflux unless he eats the wrong thing.    Denies fever, chills, weight loss, change in bowel habits or symptoms that awaken him from sleep.  Past Medical History:  Diagnosis Date  . Adenomatous polyps 03/2004, 11/2012  . Arthritis   . Bradycardia 2018  . Clotting disorder (HCC)    on plavix  . Coronary atherosclerosis of unspecified type of vessel, native or graft   . Esophagitis 1993  . GERD (gastroesophageal reflux disease)   . Hyperlipidemia   . Hypertension   . Myocardial infarction ([place]) 1993  . Shingles 2019  . Sleep apnea    cpap  . Stroke ([place]) 02/2014  . Trigger finger (acquired)   . 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 UTI  (urinary tract infection) 2019    Past Surgical History:  Procedure Laterality Date  . ANGIOPLASTY  1993  . CHOLECYSTECTOMY  2002  . LUMBAR FUSION  1983  . ROTATOR CUFF REPAIR  2009   right  . TRIGGER FINGER RELEASE     right    Current Outpatient Medications  Medication Sig Dispense Refill  . atenolol (TENORMIN) 25 MG tablet Take 0.5 tablets (12.5 mg total) by mouth daily. 45 tablet 3  . atorvastatin (LIPITOR) 10 MG tablet TAKE 1 TABLET EVERY DAY 90 tablet 3  . clopidogrel (PLAVIX) 75 MG tablet TAKE 1 TABLET EVERY DAY 90 tablet 3  . famotidine (PEPCID) 20 MG tablet Take 20 mg by mouth daily.    . fluticasone (FLONASE) 50 MCG/ACT nasal spray INHALE 1 SPRAY INTO EACH NOSTRIL ONCE DAILY. 16 g 2  . furosemide (LASIX) 20 MG tablet Take 1 tablet (20 mg total) by mouth daily as needed for edema. 30 tablet 6  . gabapentin (NEURONTIN) 300 MG capsule Take qd on day 1. Bid on day 2. Tid on day 3 and beyond. 90 capsule 3  . irbesartan-hydrochlorothiazide (AVALIDE) 150-12.5 MG tablet TAKE 1 TABLET EVERY DAY 90 tablet 3  . mercaptopurine (PURINETHOL) 50 MG tablet TAKE 1 AND 1/2 TABLETS BY MOUTH DAILY. GIVE ON AN EMPTY STOMACH 1 HOUR BEFORE OR 2 HOURS AFTER A MEAL. 45 tablet  5  . nitroGLYCERIN (NITROSTAT) 0.4 MG SL tablet Place 1 tablet (0.4 mg total) under the tongue every 5 (five) minutes as needed for chest pain. 20 tablet 5  . potassium chloride (K-DUR,KLOR-CON) 10 MEQ tablet Take 1 tablet (10 mEq total) by mouth daily. 90 tablet 2  . tamsulosin (FLOMAX) 0.4 MG CAPS capsule Take 0.4 mg by mouth daily.     Marland Kitchen triamcinolone ointment (KENALOG) 0.5 % Apply 1 application topically 2 (two) times daily. For 10 days 30 g 0   No current facility-administered medications for this visit.     Allergies as of 10/02/2018 - Review Complete 10/02/2018  Allergen Reaction Noted  . Penicillins  12/21/2006    Family History  Problem Relation Age of Onset  . Stroke Mother   . COPD Father   . Heart disease  Sister        valve replacement  . CAD Brother   . Heart attack Brother   . Colon cancer Neg Hx   . Esophageal cancer Neg Hx   . Liver disease Neg Hx   . Diabetes Neg Hx   . Rectal cancer Neg Hx     Social History   Socioeconomic History  . Marital status: Married    Spouse name: Not on file  . Number of children: 3  . Years of education: Not on file  . Highest education level: Not on file  Occupational History  . Occupation: apartment maintenance    Employer: [employer]    Comment: retired  Scientific laboratory technician  . Financial resource strain: Not on file  . Food insecurity:    Worry: Not on file    Inability: Not on file  . Transportation needs:    Medical: Not on file    Non-medical: Not on file  Tobacco Use  . Smoking status: Former Smoker    Packs/day: 1.00    Years: 3.00    Pack years: 3.00    Types: Cigarettes    Last attempt to quit: 11/14/1966    Years since quitting: 51.9  . Smokeless tobacco: Former Systems developer    Types: Chew  . Tobacco comment: quit 50 years ago  Substance and Sexual Activity  . Alcohol use: No    Alcohol/week: 0.0 standard drinks  . Drug use: No  . Sexual activity: Not on file  Lifestyle  . Physical activity:    Days per week: Not on file    Minutes per session: Not on file  . Stress: Not on file  Relationships  . Social connections:    Talks on phone: Not on file    Gets together: Not on file    Attends religious service: Not on file    Active member of club or organization: Not on file    Attends meetings of clubs or organizations: Not on file    Relationship status: Not on file  . Intimate partner violence:    Fear of current or ex partner: Not on file    Emotionally abused: Not on file    Physically abused: Not on file    Forced sexual activity: Not on file  Other Topics Concern  . Not on file  Social History Narrative   Married (wife patient elsewhere). 3 children- son is a patient here. 4 grandkids.       Retired from  [employer]      Hobbies: work on an old truck- expensive to work on so a little at a time,  mow the kids yards, enjoys yardwork      Pt does not get regular exercise--daily caffeine use-5 cups daily    Review of Systems:    Constitutional: No weight loss, fever or chills Cardiovascular: No chest pain Respiratory: No SOB  Gastrointestinal: See HPI and otherwise negative   Physical Exam:  Vital signs: BP 126/76   Pulse 60   Ht 5' 8"  (1.727 m)   Wt 209 lb 4.8 oz (94.9 kg)   BMI 31.82 kg/m   Constitutional:   Pleasant Caucasian male appears to be in NAD, Well developed, Well nourished, alert and cooperative Respiratory: Respirations even and unlabored. Lungs clear to auscultation bilaterally.   No wheezes, crackles, or rhonchi.  Cardiovascular: Normal S1, S2. No MRG. Regular rate and rhythm. No peripheral edema, cyanosis or pallor.  Gastrointestinal:  Soft, nondistended, nontender. No rebound or guarding. Normal bowel sounds. No appreciable masses or hepatomegaly. Psychiatric: Demonstrates good judgement and reason without abnormal affect or behaviors.  RELEVANT LABS AND IMAGING: CBC    Component Value Date/Time   WBC 8.3 02/06/2018 1132   RBC 5.03 02/06/2018 1132   HGB 15.2 02/06/2018 1132   HCT 44.2 02/06/2018 1132   PLT 263.0 02/06/2018 1132   MCV 87.9 02/06/2018 1132   MCH 28.7 01/15/2015 1106   MCHC 34.4 02/06/2018 1132   RDW 17.1 (H) 02/06/2018 1132   LYMPHSABS 2.3 02/06/2018 1132   MONOABS 0.9 02/06/2018 1132   EOSABS 0.2 02/06/2018 1132   BASOSABS 0.1 02/06/2018 1132    CMP     Component Value Date/Time   NA 140 02/06/2018 1132   NA 142 01/18/2018 1229   K 3.6 02/06/2018 1132   CL 101 02/06/2018 1132   CO2 32 02/06/2018 1132   GLUCOSE 110 (H) 02/06/2018 1132   GLUCOSE 101 (H) 11/13/2006 1000   BUN 11 02/06/2018 1132   BUN 12 01/18/2018 1229   CREATININE 1.09 02/06/2018 1132   CALCIUM 9.3 02/06/2018 1132   PROT 7.2 02/06/2018 1132    ALBUMIN 3.7 02/06/2018 1132   AST 22 02/06/2018 1132   ALT 28 02/06/2018 1132   ALKPHOS 62 02/06/2018 1132   BILITOT 0.7 02/06/2018 1132   GFRNONAA 60 01/18/2018 1229   GFRAA 70 01/18/2018 1229    Assessment: 1.  Left-sided ulcerative colitis: No complications, well maintained on Mercaptopurine 75 mg daily 2.  History of adenomatous polyps: Recent colonoscopy earlier this year, repeat recommended in 3 years  Plan: 1.  Ordered CBC, CMP and lipase 2.  Refilled Mercaptopurine 75 mg daily for the next 6 months 3.  Discussed with patient that he can use his Pepcid 20 mg twice a day if necessary. 4.  Patient will follow in clinic with Dr. Fuller Plan in 6 months.  Ellouise Newer, PA-C [facility] 10/02/2018, 4:02 PM  Cc: Marin Olp, MD

## 2018-10-02 NOTE — Patient Instructions (Signed)
Your provider has requested that you go to the basement level for lab work before leaving today. Press "B" on the elevator. The lab is located at the first door on the left as you exit the elevator.  We have sent the following medications to your pharmacy for you to pick up at your convenience:mercaptopurine.

## 2018-10-03 NOTE — Progress Notes (Signed)
Reviewed and agree with management plan.  Jakyra Kenealy T. Nirav Sweda, MD FACG 

## 2018-10-10 ENCOUNTER — Ambulatory Visit (INDEPENDENT_AMBULATORY_CARE_PROVIDER_SITE_OTHER): Payer: Medicare HMO | Admitting: Family Medicine

## 2018-10-10 ENCOUNTER — Encounter: Payer: Self-pay | Admitting: Family Medicine

## 2018-10-10 VITALS — BP 124/82 | HR 69 | Temp 98.1°F | Ht 68.0 in | Wt 208.0 lb

## 2018-10-10 DIAGNOSIS — R739 Hyperglycemia, unspecified: Secondary | ICD-10-CM | POA: Diagnosis not present

## 2018-10-10 DIAGNOSIS — Z125 Encounter for screening for malignant neoplasm of prostate: Secondary | ICD-10-CM | POA: Diagnosis not present

## 2018-10-10 DIAGNOSIS — Z0000 Encounter for general adult medical examination without abnormal findings: Secondary | ICD-10-CM

## 2018-10-10 DIAGNOSIS — Z6831 Body mass index (BMI) 31.0-31.9, adult: Secondary | ICD-10-CM

## 2018-10-10 DIAGNOSIS — L570 Actinic keratosis: Secondary | ICD-10-CM | POA: Diagnosis not present

## 2018-10-10 DIAGNOSIS — N183 Chronic kidney disease, stage 3 (moderate): Secondary | ICD-10-CM | POA: Diagnosis not present

## 2018-10-10 DIAGNOSIS — E785 Hyperlipidemia, unspecified: Secondary | ICD-10-CM

## 2018-10-10 LAB — COMPREHENSIVE METABOLIC PANEL WITH GFR
ALT: 24 U/L (ref 0–53)
AST: 17 U/L (ref 0–37)
Albumin: 4 g/dL (ref 3.5–5.2)
Alkaline Phosphatase: 58 U/L (ref 39–117)
BUN: 14 mg/dL (ref 6–23)
CO2: 30 meq/L (ref 19–32)
Calcium: 9.5 mg/dL (ref 8.4–10.5)
Chloride: 100 meq/L (ref 96–112)
Creatinine, Ser: 1.2 mg/dL (ref 0.40–1.50)
GFR: 63.36 mL/min
Glucose, Bld: 96 mg/dL (ref 70–99)
Potassium: 3.7 meq/L (ref 3.5–5.1)
Sodium: 138 meq/L (ref 135–145)
Total Bilirubin: 0.7 mg/dL (ref 0.2–1.2)
Total Protein: 7 g/dL (ref 6.0–8.3)

## 2018-10-10 LAB — LIPID PANEL
Cholesterol: 113 mg/dL (ref 0–200)
HDL: 30.3 mg/dL — ABNORMAL LOW
LDL Cholesterol: 63 mg/dL (ref 0–99)
NonHDL: 82.28
Total CHOL/HDL Ratio: 4
Triglycerides: 96 mg/dL (ref 0.0–149.0)
VLDL: 19.2 mg/dL (ref 0.0–40.0)

## 2018-10-10 LAB — CBC
HCT: 48 % (ref 39.0–52.0)
Hemoglobin: 16.1 g/dL (ref 13.0–17.0)
MCHC: 33.5 g/dL (ref 30.0–36.0)
MCV: 89.3 fl (ref 78.0–100.0)
Platelets: 221 K/uL (ref 150.0–400.0)
RBC: 5.37 Mil/uL (ref 4.22–5.81)
RDW: 16.3 % — ABNORMAL HIGH (ref 11.5–15.5)
WBC: 6.5 K/uL (ref 4.0–10.5)

## 2018-10-10 LAB — HEMOGLOBIN A1C: Hgb A1c MFr Bld: 5.9 % (ref 4.6–6.5)

## 2018-10-10 LAB — PSA: PSA: 1.79 ng/mL (ref 0.10–4.00)

## 2018-10-10 MED ORDER — GABAPENTIN 300 MG PO CAPS: 300.0000 mg | ORAL_CAPSULE | Freq: Every day | ORAL | 3 refills | Status: DC | PRN

## 2018-10-10 MED ORDER — NITROGLYCERIN 0.4 MG SL SUBL: 0.4000 mg | SUBLINGUAL_TABLET | SUBLINGUAL | 5 refills | Status: DC | PRN

## 2018-10-10 NOTE — Patient Instructions (Addendum)
Please stop by lab before you go   See aftercare instructions for actinic keratosis/precancers- that we treated with cryotherapy today.  I suspect you will have multiple little red patches tomorrow and some tenderness in these areas.  If you have expanding redness or worsening pain-we do have Friday clinic hours over the long as well as some Saturday clinic hours  How about we check in in 6 months from now or sooner if you need [place].  By 6 months would love for you to lose about 5 pounds- modest goal but I think you can do it with healthy eating and regular exercise.  Obviously if you have any chest pain or shortness of breath with the exercise please stop and let us evaluate you  Hope you have a great time with your family over Thanksgiving

## 2018-10-10 NOTE — Assessment & Plan Note (Signed)
S: patient notes 2 scaly lesions with red base on right ear, 3 on left ear along pinna, one on right face, two on left cheek O: actinic keratosis noted with 2 scaly lesions with red base on right ear, 3 on left ear along pinna, one on right face, two on left cheek A/P: Cryotherapy x 8 completed. Aftercare discussed

## 2018-10-10 NOTE — Progress Notes (Signed)
Phone: [phone]  Subjective:  Patient presents today for their annual physical. Chief complaint-noted.   See problem oriented charting- ROS- full  review of systems was completed and negative except for:  Dry skin with red base on right ear, dental issues  The following were reviewed and entered/updated in epic: Past Medical History:  Diagnosis Date  . Adenomatous polyps 03/2004, 11/2012  . Arthritis   . Bradycardia 2018  . Clotting disorder (HCC)    on plavix  . Coronary atherosclerosis of unspecified type of vessel, native or graft   . Esophagitis 1993  . GERD (gastroesophageal reflux disease)   . Hyperlipidemia   . Hypertension   . Myocardial infarction ([place]) 1993  . Shingles 2019  . Sleep apnea    cpap  . Stroke ([place]) 02/2014  . Trigger finger (acquired)   . 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 UTI (urinary tract infection) 2019   Patient Active Problem List   Diagnosis Date Noted  . ULCERATIVE COLITIS-LEFT SIDE 07/03/2008    Priority: High  . CAD (coronary artery disease) 06/15/2007    Priority: High  . Hyperglycemia 06/08/2015    Priority: Medium  . CKD (chronic kidney disease), stage III ([place]) 10/24/2014    Priority: Medium  . Sleep apnea 07/02/2008    Priority: Medium  . Hyperlipidemia 06/14/2007    Priority: Medium  . Essential hypertension 06/14/2007    Priority: Medium  . History of colonic polyps 06/14/2007    Priority: Medium  . Postherpetic neuralgia 03/19/2018    Priority: Low  . Balanitis 03/06/2017    Priority: Low  . OA (osteoarthritis) of knee 12/07/2015    Priority: Low  . Former smoker 06/08/2015    Priority: Low  . Edema 02/04/2015    Priority: Low  . Allergic rhinitis 10/24/2014    Priority: Low  . Obesity (BMI 30-39.9) 04/25/2014    Priority: Low  . Right cervical radiculopathy 04/12/2013    Priority: Low  . Low back pain 01/11/2008    Priority: Low  . GERD 11/07/2007    Priority: Low  . Trigger finger,  acquired 10/23/2007    Priority: Low  . Actinic keratosis 10/10/2018   Past Surgical History:  Procedure Laterality Date  . ANGIOPLASTY  1993  . CHOLECYSTECTOMY  2002  . LUMBAR FUSION  1983  . ROTATOR CUFF REPAIR  2009   right  . TRIGGER FINGER RELEASE     right    Family History  Problem Relation Age of Onset  . Stroke Mother   . COPD Father   . Heart disease Sister        valve replacement  . CAD Brother   . Heart attack Brother   . Colon cancer Neg Hx   . Esophageal cancer Neg Hx   . Liver disease Neg Hx   . Diabetes Neg Hx   . Rectal cancer Neg Hx     Medications- reviewed and updated Current Outpatient Medications  Medication Sig Dispense Refill  . atenolol (TENORMIN) 25 MG tablet Take 0.5 tablets (12.5 mg total) by mouth daily. 45 tablet 3  . atorvastatin (LIPITOR) 10 MG tablet TAKE 1 TABLET EVERY DAY 90 tablet 3  . clopidogrel (PLAVIX) 75 MG tablet TAKE 1 TABLET EVERY DAY 90 tablet 3  . famotidine (PEPCID) 20 MG tablet Take 20 mg by mouth daily.    . fluticasone (FLONASE) 50 MCG/ACT nasal spray INHALE 1 SPRAY INTO EACH NOSTRIL ONCE DAILY. 16 g 2  .  furosemide (LASIX) 20 MG tablet Take 1 tablet (20 mg total) by mouth daily as needed for edema. 30 tablet 6  . gabapentin (NEURONTIN) 300 MG capsule Take 1 capsule (300 mg total) by mouth daily as needed. 90 capsule 3  . irbesartan-hydrochlorothiazide (AVALIDE) 150-12.5 MG tablet TAKE 1 TABLET EVERY DAY 90 tablet 3  . mercaptopurine (PURINETHOL) 50 MG tablet TAKE 1 AND 1/2 TABLETS BY MOUTH DAILY. GIVE ON AN EMPTY STOMACH 1 HOUR BEFORE OR 2 HOURS AFTER A MEAL. 45 tablet 5  . nitroGLYCERIN (NITROSTAT) 0.4 MG SL tablet Place 1 tablet (0.4 mg total) under the tongue every 5 (five) minutes as needed for chest pain. 20 tablet 5  . potassium chloride (K-DUR,KLOR-CON) 10 MEQ tablet Take 1 tablet (10 mEq total) by mouth daily. 90 tablet 2  . tamsulosin (FLOMAX) 0.4 MG CAPS capsule Take 0.4 mg by mouth daily.     Marland Kitchen triamcinolone  ointment (KENALOG) 0.5 % Apply 1 application topically 2 (two) times daily. For 10 days 30 g 0   No current facility-administered medications for this visit.     Allergies-reviewed and updated Allergies  Allergen Reactions  . Penicillins     REACTION: rash    Social History   Social History Narrative   Widower January 2019.. 3 children- son is a patient here. 4 grandkids.       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 Oropharynx normal Neck: no thyromegaly CV: RRR no murmurs rubs or gallops Lungs: CTAB no crackles, wheeze, rhonchi Abdomen: soft/nontender/nondistended/normal bowel sounds. No rebound or guarding.  Ext: trace edema Skin: warm, dry, see discussion under actinic keratosis, also multiple skin tags in axilla (gets irritated in summer months when hot- so we completed cryotherapy on bigger lesions though not inflamed at present) Neuro: grossly normal, moves all extremities, PERRLA  Procedure note: Benefits and risks verbally discussed with patient 5-8 second freeze thaw cycle of cryotherapy performed  with liquid nitrogen to 8 areas- 2 on right ear, 3 on left ear, one on right cheek, two on right cheek.  No complications.  Patient tolerated the procedure well other than mild pain. Gave handout on this from [facility] and we reviewed this content thoroughly [email or web address]  Assessment/Plan:  72 y.o. male presenting for  annual physical.  Health Maintenance counseling: 1. Anticipatory guidance: Patient counseled regarding regular dental exams -q6 months (needs to get a tooth pulled and we have signed form previously for plavix to be held), eye exams- yearly,  avoiding smoking and second hand smoke , limiting alcohol to 2 beverages per day - no alcohol.   2. Risk factor reduction:  Advised patient of need for regular exercise and diet rich and fruits and vegetables to reduce risk of heart attack and stroke. Exercise- not exercising- advised of importance. Diet-discussed trying to up veggies, cut down on portions.  Wt Readings from Last 3 Encounters:  10/10/18 208 lb (94.3 kg)  10/02/18 209 lb 4.8 oz (94.9 kg)  07/17/18 207 lb 8 oz (94.1 kg)  3. Immunizations/screenings/ancillary studies-will give Shingrix when post herpetic neuralgia resolves  Immunization History  Administered Date(s) Administered  . Influenza Split 08/19/2011, 08/17/2012  . Influenza Whole 10/23/2007, 08/14/2009, 08/13/2010  . Influenza, High Dose Seasonal PF 09/13/2016, 09/01/2017  . Influenza,inj,Quad PF,6+ Mos 08/16/2013  . Influenza-Unspecified 08/28/2014, 09/07/2015, 09/01/2017, 09/13/2018  . Pneumococcal Conjugate-13 06/08/2015  . Pneumococcal Polysaccharide-23 04/25/2014  . Td 11/14/2000, 09/21/2010  4. Prostate cancer screening- referred to urology given upswing and PSA which later came back down.  He does have some BPH on exam and I thought he has some asymmetry on the left last year- urology was not concerned. He has also seen them for hematuria and had full evaluation- yearly follow up- will trend PSA- he states needs to see them soon and can get PSA.  Lab Results  Component Value Date   PSA 1.52 03/01/2018   PSA 2.40 09/18/2017   PSA 1.17 03/11/2014   5. Colon cancer screening - 03/07/18 with 3 year follow up. Sees Dr. Fuller Plan for UC as well as below 6. Skin cancer screening-sees Dr. Denna Haggard yearly in past- is going to get back in  with him. advised regular sunscreen use. Denies worrisome, changing, or new skin lesions.  7.  Former smoker-over 50 years since quitting with only 1 pack year.  Has declined AAA screening in the past-had ultrasound in 2002 of abdomen but poorly visualized the aorta- he declines again today   Status of chronic or acute concerns   Now cleared Rash- seen in September with multiple vesicles in linear streaks on left foot- treated for possible contact dermatitis with high potency steroid  Coronary artery disease-patient remains chest pain-free.  He is on Plavix and statin He is on Plavix specifically due to history of CVA affecting vision in April 2015.  Follows with cardiology.  Has nitroglycerin but fortunately does not have to use.  Hyperlipidemia- excellent control last year with LDL 57.  Update lipids  Hypertension-controlled on atenolol 25 mill grams, Lasix 20 mg as needed for edema, irbesartan hydrochlorothiazide 150-12.5 mg.  Postherpetic neuralgia- was on gabapentin 1-2 times a day last year- had persistent pain in left upper abdomen and in the back.  Today admits using gabapentin once a day- will refill   GERD-doing well on Pepcid-stopped Zantac due to recall.  Trying to avoid proton pump inhibitor with CKD 3  CKD 3-monitor with bmet  Ulcerative colitis-follows with Dr. Fuller Plan and is on mercaptopurine  Hyperglycemia- he is at risk for diabetes-we will update A1c-focus on healthy eating and regular exercise  Actinic keratosis S: patient notes 2 scaly lesions with red base on right ear, 3 on left ear along pinna, one on right face, two on left cheek O: actinic keratosis noted with 2 scaly lesions with red base on right ear, 3 on left ear along pinna, one on right face, two on left cheek A/P: Cryotherapy x 8 completed. Aftercare discussed   Future Appointments  Date Time Provider [place]  03/07/2019  1:00 PM [facility]   No follow-ups on  file.  Lab/Order associations:Fasting Preventative health care - Plan: Hemoglobin A1c, CBC, Lipid panel, Comprehensive metabolic panel, PSA  Hyperlipidemia, unspecified hyperlipidemia type - Plan: CBC, Lipid panel, Comprehensive metabolic panel  Hyperglycemia - Plan: Hemoglobin A1c  Screening for prostate cancer - Plan: PSA  BMI 31.0-31.9,adult  Actinic keratosis  Meds ordered this encounter  Medications  . nitroGLYCERIN (NITROSTAT) 0.4 MG SL tablet    Sig: Place 1 tablet (0.4 mg total) under the tongue every 5 (five) minutes as needed for chest pain.    Dispense:  20 tablet    Refill:  5  . gabapentin (NEURONTIN) 300 MG capsule    Sig: Take 1 capsule (300 mg total) by mouth daily as needed.    Dispense:  90 capsule    Refill:  3   Return precautions advised.  Garret Reddish, MD

## 2018-10-15 ENCOUNTER — Other Ambulatory Visit: Payer: Self-pay | Admitting: Internal Medicine

## 2018-10-17 ENCOUNTER — Telehealth: Payer: Self-pay

## 2018-10-17 NOTE — Telephone Encounter (Signed)
Unfortunately this may mean the procedure has failed-if he has worsening redness we need to see him as you can get infections after cryotherapy sometimes.  We treated many of these so it makes sense he would have some irritation.

## 2018-10-17 NOTE — Telephone Encounter (Signed)
Called pt to discuss lab results and he expressed concerns about skin tags. He says that he was in last Wednesday and Dr Yong Channel removed some skin tags. He reports that the skin tags under his arm are red and painful. He is concerned because it has been 1 week and the skins tags have not turned dark and fallen off. Will forward to Dr. Yong Channel to advise.

## 2018-10-19 ENCOUNTER — Other Ambulatory Visit: Payer: Self-pay | Admitting: Family Medicine

## 2018-10-19 ENCOUNTER — Other Ambulatory Visit: Payer: Self-pay | Admitting: Internal Medicine

## 2018-10-19 NOTE — Telephone Encounter (Signed)
Copied from [place] [id number]. Topic: Quick Communication - Rx Refill/Question >> Oct 19, 2018 11:29 AM Reyne Dumas L wrote: Medication: atenolol (TENORMIN) 25 MG tablet  Pt states that Dr. Harrington Challenger doesn't RX this medication.  Pt states that Dr. Yong Channel is the one that should refill this.  Has the patient contacted their pharmacy? Yes - but they are getting no response. (Agent: If no, request that the patient contact the pharmacy for the refill.) (Agent: If yes, when and what did the pharmacy advise?)  Preferred Pharmacy (with phone number or street name): [place], [place] [phone] (Phone) [phone] (Fax)  Agent: Please be advised that RX refills may take up to 3 business days. We ask that you follow-up with your pharmacy.

## 2018-10-19 NOTE — Telephone Encounter (Signed)
Called and spoke with patient who states ears, neck, and face did fine. It is the skin tags in his underarms that are bothering him. The ones on his face and neck turned black and fell off. He wants to schedule an appointment to come in so I am scheduling him.

## 2018-10-24 ENCOUNTER — Encounter: Payer: Self-pay | Admitting: Family Medicine

## 2018-10-24 ENCOUNTER — Ambulatory Visit (INDEPENDENT_AMBULATORY_CARE_PROVIDER_SITE_OTHER): Payer: Medicare HMO | Admitting: Family Medicine

## 2018-10-24 VITALS — BP 118/64 | HR 58 | Temp 97.8°F | Ht 68.0 in | Wt 207.4 lb

## 2018-10-24 DIAGNOSIS — I10 Essential (primary) hypertension: Secondary | ICD-10-CM

## 2018-10-24 DIAGNOSIS — L570 Actinic keratosis: Secondary | ICD-10-CM

## 2018-10-24 DIAGNOSIS — L918 Other hypertrophic disorders of the skin: Secondary | ICD-10-CM

## 2018-10-24 NOTE — Patient Instructions (Signed)
For right ear lesion- if does not resolve- will let me know and we will refer to dermatology  Same care guidelines as last time for these frozen areas  Sorry all the skin tags didn't cooperate- if you start having issues such as recurrent rash/irritation from them let us know- insurance will cover with recurrent issues

## 2018-10-24 NOTE — Progress Notes (Addendum)
Subjective:  [name] is a 72 y.o. year old very pleasant male patient who presents for/with See problem oriented charting ROS-no edema.  No chest pain, shortness of breath, headache, blurry vision  Past Medical History-  Patient Active Problem List   Diagnosis Date Noted  . ULCERATIVE COLITIS-LEFT SIDE 07/03/2008    Priority: High  . CAD (coronary artery disease) 06/15/2007    Priority: High  . Hyperglycemia 06/08/2015    Priority: Medium  . CKD (chronic kidney disease), stage III ([place]) 10/24/2014    Priority: Medium  . Sleep apnea 07/02/2008    Priority: Medium  . Hyperlipidemia 06/14/2007    Priority: Medium  . Essential hypertension 06/14/2007    Priority: Medium  . History of colonic polyps 06/14/2007    Priority: Medium  . Postherpetic neuralgia 03/19/2018    Priority: Low  . Balanitis 03/06/2017    Priority: Low  . OA (osteoarthritis) of knee 12/07/2015    Priority: Low  . Former smoker 06/08/2015    Priority: Low  . Edema 02/04/2015    Priority: Low  . Allergic rhinitis 10/24/2014    Priority: Low  . Obesity (BMI 30-39.9) 04/25/2014    Priority: Low  . Right cervical radiculopathy 04/12/2013    Priority: Low  . Low back pain 01/11/2008    Priority: Low  . GERD 11/07/2007    Priority: Low  . Trigger finger, acquired 10/23/2007    Priority: Low  . Actinic keratosis 10/10/2018    Medications- reviewed and updated Current Outpatient Medications  Medication Sig Dispense Refill  . atenolol (TENORMIN) 25 MG tablet Take 0.5 tablets (12.5 mg total) by mouth daily. Please call and schedule an appointment for further refills 1st attempt 45 tablet 0  . atorvastatin (LIPITOR) 10 MG tablet TAKE 1 TABLET EVERY DAY 90 tablet 3  . clopidogrel (PLAVIX) 75 MG tablet TAKE 1 TABLET EVERY DAY 90 tablet 3  . famotidine (PEPCID) 20 MG tablet Take 20 mg by mouth daily.    . fluticasone (FLONASE) 50 MCG/ACT nasal spray INHALE 1 SPRAY INTO EACH NOSTRIL ONCE DAILY. 16 g 2   . furosemide (LASIX) 20 MG tablet Take 1 tablet (20 mg total) by mouth daily as needed for edema. 30 tablet 6  . gabapentin (NEURONTIN) 300 MG capsule Take 1 capsule (300 mg total) by mouth daily as needed. 90 capsule 3  . irbesartan-hydrochlorothiazide (AVALIDE) 150-12.5 MG tablet TAKE 1 TABLET EVERY DAY 90 tablet 3  . mercaptopurine (PURINETHOL) 50 MG tablet TAKE 1 AND 1/2 TABLETS BY MOUTH DAILY. GIVE ON AN EMPTY STOMACH 1 HOUR BEFORE OR 2 HOURS AFTER A MEAL. 45 tablet 5  . nitroGLYCERIN (NITROSTAT) 0.4 MG SL tablet Place 1 tablet (0.4 mg total) under the tongue every 5 (five) minutes as needed for chest pain. 20 tablet 5  . potassium chloride (K-DUR,KLOR-CON) 10 MEQ tablet TAKE 1 TABLET EVERY DAY 90 tablet 0  . tamsulosin (FLOMAX) 0.4 MG CAPS capsule Take 0.4 mg by mouth daily.     Marland Kitchen triamcinolone ointment (KENALOG) 0.5 % Apply 1 application topically 2 (two) times daily. For 10 days 30 g 0   Objective: BP 118/64 (BP Location: Left Arm, Patient Position: Sitting, Cuff Size: Large)   Pulse (!) 58   Temp 97.8 F (36.6 C) (Oral)   Ht 5' 8"  (1.727 m)   Wt 207 lb 6.1 oz (94.1 kg)   BMI 31.53 kg/m  Gen: NAD, resting comfortably CV: RRR no  rubs or gallops Lungs:  CTAB no crackles, wheeze, rhonchi Abdomen: soft/nontender/nondistended/normal bowel sounds.  Ext: no edema Skin: warm, dry, already year there is approximately 1 x 1 cm actinic keratosis on right ear-erythematous base with scaling on top  Procedure note: Benefits and risks verbally discussed with patient 10 second freeze thaw cycle of cryotherapy performed  with liquid nitrogen to right ear No complications.  Patient tolerated the procedure well other than mild pain. Referenced prior handout on this from [facility] for him for after care [email or web address]  Assessment/Plan:  Skin tags axilla S: He had multiple actinic keratosis treated on his face or ears- we  used some of the additional cryotherapy to treat several skin tags under his axilla bilaterally during physical- a few fell off- a few did not.  A/P:patient requests I remove lesions in axilla - we discussed ways to prevent them and that we could remove them if recurrent rash or irritation but at present removing them would be for cosmetic concerns  Actinic keratosis S: actinic keratosis cleared on left ear and face and right face- still largest AK on right ear at least 1 x 1 cm A/P: offered dermatology referral for resistant AK- he declines- asks for one more round of cryotherapy- if does not resolve- will let me know and we will refer to dermatology   Essential hypertension S: controlled on irbesartan hctz 160-25 mg, atenolol 58m daily.  Not having to use Lasix BP Readings from Last 3 Encounters:  10/24/18 118/64  10/10/18 124/82  10/02/18 126/76  A/P: We discussed blood pressure goal of <140/90.  Continue current therapy.  No recent issues with edema-he asks me about his potassium from physical and we reviewed this was normal-instructed him to only use potassium if it uses Lasix     Future Appointments  Date Time [place] 03/07/2019  1:00 PM [facility] 04/10/2019 10:40 AM HMarin Olp MD [facility]    Return precautions advised.  SGarret Reddish MD

## 2018-10-24 NOTE — Assessment & Plan Note (Addendum)
S: actinic keratosis cleared on left ear and face and right face- still largest AK on right ear at least 1 x 1 cm A/P: offered dermatology referral for resistant AK- he declines- asks for one more round of cryotherapy- if does not resolve- will let me know and we will refer to dermatology

## 2018-10-24 NOTE — Assessment & Plan Note (Signed)
S: controlled on irbesartan hctz 160-25 mg, atenolol 89m daily.  Not having to use Lasix BP Readings from Last 3 Encounters:  10/24/18 118/64  10/10/18 124/82  10/02/18 126/76  A/P: We discussed blood pressure goal of <140/90.  Continue current therapy.  No recent issues with edema-he asks me about his potassium from physical and we reviewed this was normal-instructed him to only use potassium if it uses Lasix

## 2018-11-12 ENCOUNTER — Ambulatory Visit (INDEPENDENT_AMBULATORY_CARE_PROVIDER_SITE_OTHER): Payer: Medicare HMO | Admitting: Family Medicine

## 2018-11-12 ENCOUNTER — Encounter: Payer: Self-pay | Admitting: Family Medicine

## 2018-11-12 VITALS — BP 146/88 | HR 66 | Temp 98.4°F | Ht 68.0 in | Wt 206.4 lb

## 2018-11-12 DIAGNOSIS — R05 Cough: Secondary | ICD-10-CM

## 2018-11-12 DIAGNOSIS — R059 Cough, unspecified: Secondary | ICD-10-CM

## 2018-11-12 MED ORDER — AZITHROMYCIN 250 MG PO TABS: ORAL_TABLET | ORAL | 0 refills | Status: DC

## 2018-11-12 MED ORDER — BENZONATATE 200 MG PO CAPS: 200.0000 mg | ORAL_CAPSULE | Freq: Two times a day (BID) | ORAL | 0 refills | Status: DC | PRN

## 2018-11-12 MED ORDER — IPRATROPIUM BROMIDE 0.06 % NA SOLN: 2.0000 | Freq: Four times a day (QID) | NASAL | 0 refills | Status: DC

## 2018-11-12 MED ORDER — METHYLPREDNISOLONE ACETATE 80 MG/ML IJ SUSP
80.0000 mg | Freq: Once | INTRAMUSCULAR | Status: AC
  Administered 2018-11-12: 80 mg via INTRAMUSCULAR

## 2018-11-12 NOTE — Progress Notes (Signed)
   Subjective:  [name] is a 72 y.o. male who presents today for same-day appointment with a chief complaint of sinus congestion.   HPI:  Sinus Congestion, Acute problem Started 4 days ago. Worsening over that time. Associated with cough, sneeze, sore throat and sweats.  No myalgias. Took mucinex which did not help. A few sick contacts at church. No other obvious alleviating or aggravating factors.   ROS: Per HPI  PMH: He reports that he quit smoking about 52 years ago. His smoking use included cigarettes. He has a 3.00 pack-year smoking history. He has quit using smokeless tobacco.  His smokeless tobacco use included chew. He reports that he does not drink alcohol or use drugs.  Objective:  Physical Exam: BP (!) 146/88 (BP Location: Left Arm, Patient Position: Sitting, Cuff Size: Normal)   Pulse 66   Temp 98.4 F (36.9 C) (Oral)   Ht 5' 8"  (1.727 m)   Wt 206 lb 6.1 oz (93.6 kg)   SpO2 96%   BMI 31.38 kg/m   Gen: NAD, resting comfortably HEENT: TMs with clear effusion.  Nose mucosa erythematous and boggy.  OP slightly erythematous with no exudate. CV: RRR with no murmurs appreciated Pulm: NWOB, diffuse rhonchi.  Occasional wheeze.   Assessment/Plan:  Cough/bronchitis Likely secondary to viral URI. No signs of bacterial infection. Start atrovent for rhinorrhea/sinus congestion. Will give 81m IM depo-medrol. Start Tessalon for cough. Sent in a "pocket prescription" for azithromycin with strict instruction to not start unless symptoms worsen or fail to improve within the next several days. Recommended tylenol as needed for low grade fever and pain. Encouraged good oral hydration. Return precautions reviewed. Follow up as needed.    CAlgis Greenhouse PJerline Pain MD 11/12/2018 4:52 PM

## 2018-11-12 NOTE — Patient Instructions (Signed)
Start the atrovent.  We will give you a cortisone shot.   Start tessalon for your cough.  Start the zpack if your symptoms worsen or do not improve in a few days.  Please stay well hydrated.  You can take tylenol as needed for low grade fever and pain.  Please let me know if your symptoms worsen or fail to improve.  Take care, Dr Jerline Pain

## 2018-11-30 ENCOUNTER — Encounter: Payer: Self-pay | Admitting: Physician Assistant

## 2018-11-30 ENCOUNTER — Ambulatory Visit: Payer: Medicare HMO | Admitting: Physician Assistant

## 2018-11-30 VITALS — BP 120/70 | HR 62 | Ht 68.0 in | Wt 205.1 lb

## 2018-11-30 DIAGNOSIS — E785 Hyperlipidemia, unspecified: Secondary | ICD-10-CM

## 2018-11-30 DIAGNOSIS — I2510 Atherosclerotic heart disease of native coronary artery without angina pectoris: Secondary | ICD-10-CM | POA: Diagnosis not present

## 2018-11-30 DIAGNOSIS — I10 Essential (primary) hypertension: Secondary | ICD-10-CM

## 2018-11-30 NOTE — Patient Instructions (Signed)
Medication Instructions:  Your physician recommends that you continue on your current medications as directed. Please refer to the Current Medication list given to you today.  If you need a refill on your cardiac medications before your next appointment, please call your pharmacy.   Lab work: NONE If you have labs (blood work) drawn today and your tests are completely normal, you will receive your results only by: Marland Kitchen MyChart Message (if you have MyChart) OR . A paper copy in the mail If you have any lab test that is abnormal or we need to change your treatment, we will call you to review the results.  Testing/Procedures: Your physician has requested that you have an exercise tolerance test. For further information please visit [email or web address]. Please also follow instruction sheet, as given. PLEASE HOLD YOUR ATENOLOL THE MORNING OF THE TEST.  Follow-Up: At [facility], you and your health needs are our priority.  As part of our continuing mission to provide you with exceptional heart care, we have created designated Provider Care Teams.  These Care Teams include your primary Cardiologist (physician) and Advanced Practice Providers (APPs -  Physician Assistants and Nurse Practitioners) who all work together to provide you with the care you need, when you need it. You will need a follow up appointment in:  12 months.  Please call our office 2 months in advance to schedule this appointment.  You may see No primary care provider on file. or one of the following Advanced Practice Providers on your designated Care Team: Richardson Dopp, PA-C [place], [place] . Daune Perch, NP  Any Other Special Instructions Will Be Listed Below (If Applicable).

## 2018-11-30 NOTE — Progress Notes (Signed)
Cardiology Office Note:    Date:  11/30/2018   ID:  [name], [name] 1946-04-22, MRN [id number]  PCP:  [name] Olp, MD  Cardiologist:  [name] Carnes, MD  Electrophysiologist:  None   Referring MD: [name] Olp, MD   Chief Complaint  Patient presents with  . Follow-up    CAD    History of Present Illness:    [name] is a 73 y.o. male with coronary artery disease status post myocardial infarction in 1993 treated with balloon angioplasty to the LAD, sleep apnea, hypertension, hyperlipidemia.  He was last seen by Dr. Harrington Lowery in March 2019.  His atenolol was reduced at that time secondary to bradycardia.   [name] returns for routine Cardiology follow up.  He is here alone.  He has not had any exertional chest pain.  He has occasional chest discomfort related to meals.  It seems to improve with H2RA therapy.  He has not had significant shortness of breath.  He has not had syncope, paroxysmal nocturnal dyspnea, leg swelling.  He has not taken any nitroglycerin.    Prior CV studies:   The following studies were reviewed today:  Echocardiogram 04/27/2015 EF 60-65, normal wall motion  Carotid US 03/12/2014  bilateral ICA 1-39  Nuclear stress test 02/04/2010 EF 69, no ischemia, low risk  Nuclear stress test 03/05/2008 No scar or ischemia, EF 69; Low Risk  Past Medical History:  Diagnosis Date  . Adenomatous polyps 03/2004, 11/2012  . Arthritis   . Bradycardia 2018  . Clotting disorder (HCC)    on plavix  . Coronary atherosclerosis of unspecified type of vessel, native or graft   . Esophagitis 1993  . GERD (gastroesophageal reflux disease)   . Hyperlipidemia   . Hypertension   . Myocardial infarction ([place]) 1993  . Shingles 2019  . Sleep apnea    cpap  . Stroke ([place]) 02/2014  . Trigger finger (acquired)   . 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 UTI (urinary tract infection) 2019   Surgical Hx: The patient  has a past surgical history  that includes Cholecystectomy (2002); Rotator cuff repair (2009); Lumbar fusion (1983); Angioplasty (1993); and Trigger finger release.   Current Medications: Current Meds  Medication Sig  . atenolol (TENORMIN) 25 MG tablet Take 0.5 tablets (12.5 mg total) by mouth daily. Please call and schedule an appointment for further refills 1st attempt  . atorvastatin (LIPITOR) 10 MG tablet TAKE 1 TABLET EVERY DAY  . clopidogrel (PLAVIX) 75 MG tablet TAKE 1 TABLET EVERY DAY  . famotidine (PEPCID) 20 MG tablet Take 20 mg by mouth daily.  . fluticasone (FLONASE) 50 MCG/ACT nasal spray INHALE 1 SPRAY INTO EACH NOSTRIL ONCE DAILY.  . furosemide (LASIX) 20 MG tablet Take 1 tablet (20 mg total) by mouth daily as needed for edema.  . gabapentin (NEURONTIN) 300 MG capsule Take 1 capsule (300 mg total) by mouth daily as needed.  . irbesartan-hydrochlorothiazide (AVALIDE) 150-12.5 MG tablet TAKE 1 TABLET EVERY DAY  . mercaptopurine (PURINETHOL) 50 MG tablet TAKE 1 AND 1/2 TABLETS BY MOUTH DAILY. GIVE ON AN EMPTY STOMACH 1 HOUR BEFORE OR 2 HOURS AFTER A MEAL.  . nitroGLYCERIN (NITROSTAT) 0.4 MG SL tablet Place 1 tablet (0.4 mg total) under the tongue every 5 (five) minutes as needed for chest pain.  . potassium chloride (K-DUR,KLOR-CON) 10 MEQ tablet TAKE 1 TABLET EVERY DAY  . tamsulosin (FLOMAX) 0.4 MG CAPS capsule Take 0.4 mg by mouth daily.   . [  DISCONTINUED] azithromycin (ZITHROMAX) 250 MG tablet Take 2 tabs day 1, then 1 tab daily  . [DISCONTINUED] benzonatate (TESSALON) 200 MG capsule Take 1 capsule (200 mg total) by mouth 2 (two) times daily as needed for cough.  . [DISCONTINUED] ipratropium (ATROVENT) 0.06 % nasal spray Place 2 sprays into both nostrils 4 (four) times daily.     Allergies:   Penicillins   Social History   Tobacco Use  . Smoking status: Former Smoker    Packs/day: 1.00    Years: 3.00    Pack years: 3.00    Types: Cigarettes    Last attempt to quit: 11/14/1966    Years since  quitting: 52.0  . Smokeless tobacco: Former Systems developer    Types: Chew  . Tobacco comment: quit 50 years ago  Substance Use Topics  . Alcohol use: No    Alcohol/week: 0.0 standard drinks  . Drug use: No     Family Hx: The patient's family history includes CAD in his brother; COPD in his father; Heart attack in his brother; Heart disease in his sister; Stroke in his mother. There is no history of Colon cancer, Esophageal cancer, Liver disease, Diabetes, or Rectal cancer.  ROS:   Please see the history of present illness.    ROS All other systems reviewed and are negative.   EKGs/Labs/Other Test Reviewed:    EKG:  EKG is  ordered today.  The ekg ordered today demonstrates normal sinus rhythm HR 62, normal axis, QTc 395, no changes  Recent Labs: 10/10/2018: ALT 24; BUN 14; Creatinine, Ser 1.20; Hemoglobin 16.1; Platelets 221.0; Potassium 3.7; Sodium 138   Recent Lipid Panel Lab Results  Component Value Date/Time   CHOL 113 10/10/2018 08:54 AM   TRIG 96.0 10/10/2018 08:54 AM   TRIG 88 10/20/2006 09:27 AM   HDL 30.30 (L) 10/10/2018 08:54 AM   CHOLHDL 4 10/10/2018 08:54 AM   LDLCALC 63 10/10/2018 08:54 AM    Physical Exam:    VS:  BP 120/70   Pulse 62   Ht 5' 8"  (1.727 m)   Wt 205 lb 1.9 oz (93 kg)   SpO2 97%   BMI 31.19 kg/m     Wt Readings from Last 3 Encounters:  11/30/18 205 lb 1.9 oz (93 kg)  11/12/18 206 lb 6.1 oz (93.6 kg)  10/24/18 207 lb 6.1 oz (94.1 kg)     Physical Exam  Constitutional: He is oriented to person, place, and time. He appears well-developed and well-nourished. No distress.  HENT:  Head: Normocephalic and atraumatic.  Neck: Neck supple. No JVD present. Carotid bruit is not present.  Cardiovascular: Normal rate, regular rhythm, S1 normal and S2 normal.  No murmur heard. Pulmonary/Chest: Breath sounds normal. He has no rales.  Abdominal: Soft. There is no hepatomegaly.  Musculoskeletal:        General: No edema.  Neurological: He is alert and  oriented to person, place, and time.  Skin: Skin is warm and dry.    ASSESSMENT & PLAN:    Coronary artery disease involving native coronary artery of native heart without angina pectoris  Hx of MI in 1993 treated with POBA to the LAD.  Nuclear stress test in 2011 was low risk. He has not really had any anginal symptoms.  He does yard work without any significant symptoms.  But, he does feel exhausted if he walks up 3 flights of stairs.  He is concerned about recurrent blockages.  I have recommended that he undergo routine  exercise treadmill testing.    -Plan: Exercise Tolerance Test  -Continue Clopidogrel, Atenolol, Atorvastatin  -Follow up in 1 year  Essential hypertension The patient's blood pressure is controlled on his current regimen.  Continue current therapy.    Hyperlipidemia, unspecified hyperlipidemia type LDL optimal on most recent lab work.  Continue current Rx.     Dispo:  Return in about 1 year (around 12/01/2019) for Routine Follow Up, w/ Dr. Harrington Lowery, or Richardson Dopp, PA-C.   Medication Adjustments/Labs and Tests Ordered: Current medicines are reviewed at length with the patient today.  Concerns regarding medicines are outlined above.  Tests Ordered: Orders Placed This Encounter  Procedures  . Exercise Tolerance Test  . EKG 12-Lead   Medication Changes: No orders of the defined types were placed in this encounter.   Signed, Richardson Dopp, PA-C  11/30/2018 11:20 AM    [facility] [address], [place], [place]  [address] Phone: [phone]; Fax: [phone]

## 2018-12-06 ENCOUNTER — Ambulatory Visit (INDEPENDENT_AMBULATORY_CARE_PROVIDER_SITE_OTHER): Payer: Medicare HMO

## 2018-12-06 DIAGNOSIS — I2510 Atherosclerotic heart disease of native coronary artery without angina pectoris: Secondary | ICD-10-CM

## 2018-12-07 LAB — EXERCISE TOLERANCE TEST
Estimated workload: 7 METS
Exercise duration (min): 6 min
Exercise duration (sec): 0 s
MPHR: 149 {beats}/min
Peak HR: 129 {beats}/min
Percent HR: 86 %
RPE: 16
Rest HR: 59 {beats}/min

## 2018-12-18 DIAGNOSIS — X32XXXA Exposure to sunlight, initial encounter: Secondary | ICD-10-CM | POA: Diagnosis not present

## 2018-12-18 DIAGNOSIS — B9689 Other specified bacterial agents as the cause of diseases classified elsewhere: Secondary | ICD-10-CM | POA: Diagnosis not present

## 2018-12-18 DIAGNOSIS — L0232 Furuncle of buttock: Secondary | ICD-10-CM | POA: Diagnosis not present

## 2018-12-18 DIAGNOSIS — L570 Actinic keratosis: Secondary | ICD-10-CM | POA: Diagnosis not present

## 2018-12-26 ENCOUNTER — Other Ambulatory Visit: Payer: Self-pay | Admitting: Internal Medicine

## 2018-12-29 ENCOUNTER — Other Ambulatory Visit: Payer: Self-pay | Admitting: Family Medicine

## 2018-12-31 DIAGNOSIS — R350 Frequency of micturition: Secondary | ICD-10-CM | POA: Diagnosis not present

## 2018-12-31 DIAGNOSIS — R3121 Asymptomatic microscopic hematuria: Secondary | ICD-10-CM | POA: Diagnosis not present

## 2018-12-31 DIAGNOSIS — N401 Enlarged prostate with lower urinary tract symptoms: Secondary | ICD-10-CM | POA: Diagnosis not present

## 2019-01-10 ENCOUNTER — Encounter: Payer: Self-pay | Admitting: Family Medicine

## 2019-01-10 ENCOUNTER — Ambulatory Visit (INDEPENDENT_AMBULATORY_CARE_PROVIDER_SITE_OTHER): Payer: Medicare HMO | Admitting: Family Medicine

## 2019-01-10 VITALS — BP 122/74 | HR 55 | Temp 98.2°F | Ht 68.0 in | Wt 204.0 lb

## 2019-01-10 DIAGNOSIS — B9789 Other viral agents as the cause of diseases classified elsewhere: Secondary | ICD-10-CM

## 2019-01-10 DIAGNOSIS — J329 Chronic sinusitis, unspecified: Secondary | ICD-10-CM

## 2019-01-10 DIAGNOSIS — R21 Rash and other nonspecific skin eruption: Secondary | ICD-10-CM | POA: Diagnosis not present

## 2019-01-10 DIAGNOSIS — I10 Essential (primary) hypertension: Secondary | ICD-10-CM | POA: Diagnosis not present

## 2019-01-10 MED ORDER — METHYLPREDNISOLONE ACETATE 80 MG/ML IJ SUSP
80.0000 mg | Freq: Once | INTRAMUSCULAR | Status: AC
  Administered 2019-01-10: 80 mg via INTRAMUSCULAR

## 2019-01-10 NOTE — Patient Instructions (Addendum)
Cortisone shot today  If not doing better by Monday- give me a call- particularly if you have yellow/green drainage from nose or continued sinus headaches. Definitely let us know sooner if you get fever or shortness of breath- I dont hear a pneumonia right now but getting fever or shortness of rbeath could be a sign of that.

## 2019-01-10 NOTE — Progress Notes (Signed)
PCP: Marin Olp, MD  Subjective:  [name] is a 73 y.o. year old very pleasant male patient who presents with sinusitis symptoms including nasal congestion, sinus tenderness.  He is also concern for bronchitis symptoms with chest congestion and wheezing  Patient diagnosed with bronchitis back in December - treated with cortisone and azithromycin- seemed to improve quickly  Current symptoms include cough, chest congestion, wheezing Also with nasal drainage. Minimal mild sore throat.  Day 7 today Primarily clear drainage from sinuses.  Some yellow. Symptoms stable in course No shortness of breath No fever.  No chest pain.  Not sleeping well Some dull frontal headaches/potential sinus pressure Son has been sick recently-sound slike minimum URI -sick contacts/travel/risks: denies flu exposure.   ROS-denies fever, SOB, nausea, vomiting, diarrhea, tooth pain  Pertinent Past Medical History-  Patient Active Problem List   Diagnosis Date Noted  . ULCERATIVE COLITIS-LEFT SIDE 07/03/2008    Priority: High  . CAD (coronary artery disease) 06/15/2007    Priority: High  . Hyperglycemia 06/08/2015    Priority: Medium  . CKD (chronic kidney disease), stage III ([place]) 10/24/2014    Priority: Medium  . Sleep apnea 07/02/2008    Priority: Medium  . Hyperlipidemia 06/14/2007    Priority: Medium  . Essential hypertension 06/14/2007    Priority: Medium  . History of colonic polyps 06/14/2007    Priority: Medium  . Postherpetic neuralgia 03/19/2018    Priority: Low  . Balanitis 03/06/2017    Priority: Low  . OA (osteoarthritis) of knee 12/07/2015    Priority: Low  . Former smoker 06/08/2015    Priority: Low  . Edema 02/04/2015    Priority: Low  . Allergic rhinitis 10/24/2014    Priority: Low  . Obesity (BMI 30-39.9) 04/25/2014    Priority: Low  . Right cervical radiculopathy 04/12/2013    Priority: Low  . Low back pain 01/11/2008    Priority: Low  . GERD  11/07/2007    Priority: Low  . Trigger finger, acquired 10/23/2007    Priority: Low  . Actinic keratosis 10/10/2018    Medications- reviewed  Current Outpatient Medications  Medication Sig Dispense Refill  . atenolol (TENORMIN) 25 MG tablet TAKE (1/2) TABLET BY MOUTH ONCE DAILY. 45 tablet 3  . atorvastatin (LIPITOR) 10 MG tablet TAKE 1 TABLET EVERY DAY 90 tablet 3  . clopidogrel (PLAVIX) 75 MG tablet TAKE 1 TABLET EVERY DAY 90 tablet 3  . famotidine (PEPCID) 20 MG tablet Take 20 mg by mouth daily.    . fluticasone (FLONASE) 50 MCG/ACT nasal spray INHALE 1 SPRAY INTO EACH NOSTRIL ONCE DAILY. 16 g 2  . furosemide (LASIX) 20 MG tablet Take 1 tablet (20 mg total) by mouth daily as needed for edema. 30 tablet 6  . gabapentin (NEURONTIN) 300 MG capsule Take 1 capsule (300 mg total) by mouth daily as needed. 90 capsule 3  . irbesartan-hydrochlorothiazide (AVALIDE) 150-12.5 MG tablet TAKE 1 TABLET EVERY DAY 90 tablet 3  . mercaptopurine (PURINETHOL) 50 MG tablet TAKE 1 AND 1/2 TABLETS BY MOUTH DAILY. GIVE ON AN EMPTY STOMACH 1 HOUR BEFORE OR 2 HOURS AFTER A MEAL. 45 tablet 5  . nitroGLYCERIN (NITROSTAT) 0.4 MG SL tablet Place 1 tablet (0.4 mg total) under the tongue every 5 (five) minutes as needed for chest pain. 20 tablet 5  . potassium chloride (K-DUR,KLOR-CON) 10 MEQ tablet TAKE 1 TABLET EVERY DAY 90 tablet 0  . tamsulosin (FLOMAX) 0.4 MG CAPS capsule Take 0.4  mg by mouth daily.      No current facility-administered medications for this visit.     Objective: BP 122/74   Pulse (!) 55   Temp 98.2 F (36.8 C) (Oral)   Ht 5' 8"  (1.727 m)   Wt 204 lb (92.5 kg)   SpO2 97%   BMI 31.02 kg/m  Gen: NAD, resting comfortably HEENT: Turbinates erythematous with yellow drainage, TM normal, pharynx mildly erythematous with no tonsilar exudate or edema, minimal sinus tenderness CV: RRR no murmurs rubs or gallops Lungs: Some rhonchi noted.  No wheeze or rales today. Skin: warm, dry, on bilateral  upper buttocks has slight erythematous blanching area with central scaling that is not easily removed-tender to palpation. Neuro: Speech and gait normal  Assessment/Plan:  Sinus infection/Sinusitis Viral based on <10 days, no double sickening, lack of severity of symptoms in first 3 days. Educated on signs that bacterial infection may have developed (symptoms over 10 days, double sickening).   With his wheezing and chest congestion-this could be bronchitis as well  Treatment: -symptomatic care with Depo-Medrol 80 mg-patient seemed to do really well with this back in December.  -Antibiotic indicated: no  Not at present- but we agreed I would call something in if symptoms worsen after improving or last past 10 days-I instructed him to call me by Monday if not improving-treatment would be for sinusitis-since he did well with azithromycin in the past it would be reasonable to repeat use.  If that failed could consider doxycycline.  Note penicillin allergy  Finally, we reviewed reasons to return to care including if symptoms worsen or persist  (despite above treatments) or new concerns arise (particularly fever or shortness of breath)   Essential hypertension S:On atenolol 12.77m daily, lasix only as needed, irbesartan-hctz 150-12.547mand takes potassium 1095mper day.   Home #s 150s over 80s or low 90s-but well controlled today BP Readings from Last 3 Encounters:  01/10/19 122/74  11/30/18 120/70  11/12/18 (!) 146/88    A/P: Home numbers seem higher than in office numbers-for that reason I advised him to bring his cuff to the next visit so we can compare.  If home cuff is accurate may want to double his irbesartan-hydrochlorothiazide dose.    #Rash on buttocks-has seen dermatology-Took 14 days of doxy from 12/18/18 from derm.  This looks like possible pressure ulcer but since dermatology thought potentially infectious and they have already started management-I encouraged him to follow-up with  them.  They may also want to consider biopsy.  I doubt solely inflammatory-but if it was the Depo-Medrol may help   Meds ordered this encounter  Medications  . methylPREDNISolone acetate (DEPO-MEDROL) injection 80 mg    SteGarret ReddishD

## 2019-01-11 NOTE — Assessment & Plan Note (Signed)
S:On atenolol 12.71m daily, lasix only as needed, irbesartan-hctz 150-12.540mand takes potassium 102mper day.   Home #s 150s over 80s or low 90s-but well controlled today BP Readings from Last 3 Encounters:  01/10/19 122/74  11/30/18 120/70  11/12/18 (!) 146/88    A/P: Home numbers seem higher than in office numbers-for that reason I advised him to bring his cuff to the next visit so we can compare.  If home cuff is accurate may want to double his irbesartan-hydrochlorothiazide dose.

## 2019-01-14 ENCOUNTER — Telehealth: Payer: Self-pay | Admitting: Family Medicine

## 2019-01-14 MED ORDER — AZITHROMYCIN 250 MG PO TABS: ORAL_TABLET | ORAL | 0 refills | Status: DC

## 2019-01-14 NOTE — Telephone Encounter (Signed)
Penicillin allergic-I sent in azithromycin to [facility].  Please inform patient.

## 2019-01-14 NOTE — Telephone Encounter (Signed)
See note  Copied from [place] [phone]. Topic: General - Inquiry >> Jan 14, 2019  9:37 AM Virl Axe D wrote: Reason for CRM: Pt stated he was instructed by Dr. Yong Channel to call back if he had not improved since his OV on 01/10/19 and Dr. Yong Channel would send in a rx to his pharmacy. Pt stated he has not improved at all. Please advise. CB#[phone]  [place], [place] - [address] [phone] (Phone) [phone] (Fax)

## 2019-01-14 NOTE — Telephone Encounter (Signed)
Left message to return phone call.

## 2019-01-14 NOTE — Telephone Encounter (Signed)
Pt stated that he is having the same sx since OV. Please advise

## 2019-01-14 NOTE — Addendum Note (Signed)
Addended by: Marin Olp on: 01/14/2019 08:24 PM   Modules accepted: Orders

## 2019-01-16 NOTE — Telephone Encounter (Signed)
Left detailed message to see if pt received vaccination.

## 2019-01-16 NOTE — Telephone Encounter (Signed)
Patient notified Rx sent.

## 2019-02-01 ENCOUNTER — Telehealth: Payer: Self-pay | Admitting: Family Medicine

## 2019-02-01 NOTE — Telephone Encounter (Signed)
Pt stated BP has been "high" and was 89/159.last night. Pt wants to know if he can take 2 of his BP meds. Please advise.

## 2019-02-01 NOTE — Telephone Encounter (Signed)
Patient currently taking Atenolol 25 MG ,taking 1/2 tab daily Ok to increase to 25 MG daily.Per Dr.Parker.Follow Up with PCP.Patient voices understanding.

## 2019-02-01 NOTE — Telephone Encounter (Signed)
Ok for him to take a full dose of atenolol. He is currently on 25m daily. Ok to increase to 541mdaily. Would like for him to follow up with PCP next week.  CaAlgis GreenhousePaJerline PainMD 02/01/2019 11:51 AM

## 2019-02-01 NOTE — Telephone Encounter (Signed)
This encounter was created in error - please disregard.

## 2019-02-01 NOTE — Telephone Encounter (Signed)
Spoke to pt and he stated that his home Bp machine is maybe running higher than usual. Pt claimed that last night his reading was 159/89. Pt was wanting to take a 2 of his BP medication. Pt stated that he purchased hi machine last year. Pt stated he has checked it several times and it was reading at its highest at 160.

## 2019-03-07 ENCOUNTER — Ambulatory Visit: Payer: Medicare HMO

## 2019-03-08 ENCOUNTER — Other Ambulatory Visit: Payer: Self-pay | Admitting: Internal Medicine

## 2019-04-02 ENCOUNTER — Other Ambulatory Visit: Payer: Self-pay | Admitting: Physician Assistant

## 2019-04-10 ENCOUNTER — Ambulatory Visit (INDEPENDENT_AMBULATORY_CARE_PROVIDER_SITE_OTHER): Payer: Medicare HMO | Admitting: Family Medicine

## 2019-04-10 ENCOUNTER — Encounter: Payer: Self-pay | Admitting: Family Medicine

## 2019-04-10 VITALS — BP 148/75 | HR 59 | Temp 96.4°F | Ht 68.0 in | Wt 202.0 lb

## 2019-04-10 DIAGNOSIS — I2510 Atherosclerotic heart disease of native coronary artery without angina pectoris: Secondary | ICD-10-CM | POA: Diagnosis not present

## 2019-04-10 DIAGNOSIS — E785 Hyperlipidemia, unspecified: Secondary | ICD-10-CM | POA: Diagnosis not present

## 2019-04-10 DIAGNOSIS — I10 Essential (primary) hypertension: Secondary | ICD-10-CM | POA: Diagnosis not present

## 2019-04-10 MED ORDER — ATENOLOL 25 MG PO TABS: 25.0000 mg | ORAL_TABLET | Freq: Every day | ORAL | 3 refills | Status: DC

## 2019-04-10 NOTE — Progress Notes (Signed)
Phone [phone]   Subjective:  Virtual visit via phonenote Chief Complaint  Patient presents with  . Hypertension    follow up    This visit type was conducted due to national recommendations for restrictions regarding the COVID-19 Pandemic (e.g. social distancing).  This format is felt to be most appropriate for this patient at this time balancing risks to patient and risks to population by having him in for in person visit.  All issues noted in this document were discussed and addressed.  No physical exam was performed (except for noted visual exam or audio findings with Telehealth visits).  The patient has consented to conduct a Telehealth visit and understands insurance will be billed.   Our team/I connected with [name] at 10:40 AM EDT by phone (patient did not have equipment for webex) and verified that I am speaking with the correct person using two identifiers.  Location patient: [facility] Location provider: [facility], office Persons participating in the virtual visit:  patient  Time on phone: 15 minutes Counseling provided about bp goals, covid precautions  Our team/I discussed the limitations of evaluation and management by telemedicine and the availability of in person appointments. In light of current covid-19 pandemic, patient also understands that we are trying to protect them by minimizing in office contact if at all possible.  The patient expressed consent for telemedicine visit and agreed to proceed. Patient understands insurance will be billed.   ROS- no recent edema. No chest pain or shortness of breath.  No fever, chills, cough,  body aches, sore throat, or loss of taste or smell   Past Medical History-  Patient Active Problem List   Diagnosis Date Noted  . ULCERATIVE COLITIS-LEFT SIDE 07/03/2008    Priority: High  . CAD (coronary artery disease) 06/15/2007    Priority: High  . Hyperglycemia 06/08/2015    Priority: Medium  . CKD (chronic kidney  disease), stage III ([place]) 10/24/2014    Priority: Medium  . Sleep apnea 07/02/2008    Priority: Medium  . Hyperlipidemia 06/14/2007    Priority: Medium  . Essential hypertension 06/14/2007    Priority: Medium  . History of colonic polyps 06/14/2007    Priority: Medium  . Postherpetic neuralgia 03/19/2018    Priority: Low  . Balanitis 03/06/2017    Priority: Low  . OA (osteoarthritis) of knee 12/07/2015    Priority: Low  . Former smoker 06/08/2015    Priority: Low  . Edema 02/04/2015    Priority: Low  . Allergic rhinitis 10/24/2014    Priority: Low  . Obesity (BMI 30-39.9) 04/25/2014    Priority: Low  . Right cervical radiculopathy 04/12/2013    Priority: Low  . Low back pain 01/11/2008    Priority: Low  . GERD 11/07/2007    Priority: Low  . Trigger finger, acquired 10/23/2007    Priority: Low  . Actinic keratosis 10/10/2018    Medications- reviewed and updated Current Outpatient Medications  Medication Sig Dispense Refill  . atenolol (TENORMIN) 25 MG tablet Take 1 tablet (25 mg total) by mouth daily. 90 tablet 3  . atorvastatin (LIPITOR) 10 MG tablet TAKE 1 TABLET EVERY DAY 90 tablet 3  . clopidogrel (PLAVIX) 75 MG tablet TAKE 1 TABLET EVERY DAY 90 tablet 3  . famotidine (PEPCID) 20 MG tablet Take 20 mg by mouth daily.    . fluticasone (FLONASE) 50 MCG/ACT nasal spray INHALE 1 SPRAY INTO EACH NOSTRIL ONCE DAILY. 16 g 2  . furosemide (LASIX)  20 MG tablet Take 1 tablet (20 mg total) by mouth daily as needed for edema. 30 tablet 6  . gabapentin (NEURONTIN) 300 MG capsule Take 1 capsule (300 mg total) by mouth daily as needed. 90 capsule 3  . irbesartan-hydrochlorothiazide (AVALIDE) 150-12.5 MG tablet TAKE 1 TABLET EVERY DAY 90 tablet 3  . mercaptopurine (PURINETHOL) 50 MG tablet TAKE 1 AND 1/2 TABLETS BY MOUTH DAILY. GIVE ON AN EMPTY STOMACH 1 HOUR BEFORE OR 2 HOURS AFTER A MEAL. 45 tablet 0  . nitroGLYCERIN (NITROSTAT) 0.4 MG SL tablet Place 1 tablet (0.4 mg total) under  the tongue every 5 (five) minutes as needed for chest pain. 20 tablet 5  . potassium chloride (K-DUR) 10 MEQ tablet TAKE 1 TABLET EVERY DAY 90 tablet 0  . tamsulosin (FLOMAX) 0.4 MG CAPS capsule Take 0.4 mg by mouth daily.     Marland Kitchen moxifloxacin (VIGAMOX) 0.5 % ophthalmic solution      No current facility-administered medications for this visit.      Objective:  BP (!) 148/75   Pulse (!) 59   Temp (!) 96.4 F (35.8 C) (Oral)   Ht 5' 8"  (1.727 m)   Wt 202 lb (91.6 kg)   BMI 30.71 kg/m  self reported vitals  Nonlabored voice, normal speech      Assessment and Plan   #hypertension S: poorly controlled on atenolol 12.66m, irbesartan hctz 150-12.586m also takes potassium from Dr. RoHarrington Challengertakes lasix as needed.  He has tried atenolol 25 mg and heart rate seems to stay above 50 and blood pressure improves. BP Readings from Last 3 Encounters:  04/10/19 (!) 148/75  01/10/19 122/74  11/30/18 120/70  A/P: Due to poor control-trial whole atenolol as he has noted better #s when taking this. If HR gets below 50, would go back to half tablet and likely increase irbesartan-hctz instead - 2 week follow up in person as long as COVID symptom-free-he will bring his home cuff with him  #hyperlipidemia/CAD S: Previously controlled on atorvastatin 1070m  Also taking plavix 75 mg for known CAD. No chest pain or shortness of breath.  Lab Results  Component Value Date   CHOL 113 10/10/2018   HDL 30.30 (L) 10/10/2018   LDLCALC 63 10/10/2018   TRIG 96.0 10/10/2018   CHOLHDL 4 10/10/2018   A/P:  Stable x2. Continue current medications.    Future Appointments  Date Time Provider DepMinnehaha/08/2019 10:40 AM HunMarin OlpD [facility]/12/2018  9:20 AM HunYong ChanneleBrayton MarsD [facility]   No follow-ups on file.  Lab/Order associations: No diagnosis found.  Meds ordered this encounter  Medications  . atenolol (TENORMIN) 25 MG tablet    Sig: Take 1 tablet (25 mg total) by mouth  daily.    Dispense:  90 tablet    Refill:  3    Return precautions advised.  SteGarret ReddishD

## 2019-04-10 NOTE — Patient Instructions (Addendum)
There are no preventive care reminders to display for this patient.  Depression screen [facility] 2/9 10/24/2018 10/10/2018 03/01/2018  Decreased Interest 0 0 0  Down, Depressed, Hopeless 0 1 0  PHQ - 2 Score 0 1 0   Phone visit

## 2019-04-24 ENCOUNTER — Other Ambulatory Visit: Payer: Self-pay

## 2019-04-24 ENCOUNTER — Encounter: Payer: Self-pay | Admitting: Family Medicine

## 2019-04-24 ENCOUNTER — Ambulatory Visit (INDEPENDENT_AMBULATORY_CARE_PROVIDER_SITE_OTHER): Payer: Medicare HMO | Admitting: Family Medicine

## 2019-04-24 VITALS — BP 160/94 | HR 56 | Temp 98.0°F | Ht 68.0 in | Wt 201.4 lb

## 2019-04-24 DIAGNOSIS — K5150 Left sided colitis without complications: Secondary | ICD-10-CM | POA: Diagnosis not present

## 2019-04-24 DIAGNOSIS — I10 Essential (primary) hypertension: Secondary | ICD-10-CM

## 2019-04-24 DIAGNOSIS — K5190 Ulcerative colitis, unspecified, without complications: Secondary | ICD-10-CM | POA: Diagnosis not present

## 2019-04-24 MED ORDER — IRBESARTAN-HYDROCHLOROTHIAZIDE 150-12.5 MG PO TABS: 2.0000 | ORAL_TABLET | Freq: Every day | ORAL | 3 refills | Status: DC

## 2019-04-24 NOTE — Assessment & Plan Note (Signed)
S: Patient follows with Dr. Fuller Plan- no recent issues while taking mercaptopurine A/P:  Stable. Continue current medications.

## 2019-04-24 NOTE — Assessment & Plan Note (Signed)
S: controlled poorly on atenolol 25 mg-up from 12.5 mg- in addition to irbesartan hydrochlorothiazide 150-12.5 mg daily.  Rarely using Lasix  Home cuff was verified-we repeated blood pressure and 147/84 his check and 146/82 my check  Patient wonders why blood pressure is increased-Similar activity level. No increased salt intake.  BP Readings from Last 3 Encounters:  04/24/19 (!) 160/94  04/10/19 (!) 148/75  01/10/19 122/74  A/P: Hypertension is poorly controlled-continue atenolol 25 mg for now (though may decrease to 12.5 mg if blood pressure very well controlled with below step as has been reduced in the past due to bradycardia) - We will increase irbesartan hydrochlorothiazide 150-12.5 mg to 2 tablets daily for total of irbesartan 300 mg and hydrochlorothiazide 25 mg -2 to 3-week update through my chart recommended -No edema or weight gain to suggest need for Lasix or fluid overload - Offered updated blood work such as BMP and TSH but patient declined.  Would also be reasonable to get CBC based on ulcerative colitis history

## 2019-04-24 NOTE — Patient Instructions (Addendum)
Blood pressure not well controlled today -continue full tablet atenolol for now as long as HR stays above 50 - increase irbesartan hctz 160-12.18m to two tablets daily -give me an update in 2-3 weeks. My hope is that blood pressure on average <140/90. You can send me last 10 readings through mychart when you do the update.   We discussed possibly updating bloodwork if we cant get this down soon  Also we could cut atenolol back to half tablet depending on how blood pressure does with increase

## 2019-04-24 NOTE — Progress Notes (Signed)
Phone [phone]   Subjective:  [name] is a 73 y.o. year old very pleasant male patient who presents for/with See problem oriented charting Chief Complaint  Patient presents with  . Follow-up  . Hypertension    Checks BP at home. BP at home this morning was 160/88, HR 63. Denies HA or visual changes. He doesn't add salt to his food. Has not been exercising regularly d/t Covid-19. Increased Atenolol from 12.5 mg daily to 25 mg daily. He has not been taking Lasix regularly.    ROS- No chest pain or shortness of breath. No headache or blurry vision.  No swelling recently  Past Medical History-  Patient Active Problem List   Diagnosis Date Noted  . ULCERATIVE COLITIS-LEFT SIDE 07/03/2008    Priority: High  . CAD (coronary artery disease) 06/15/2007    Priority: High  . Hyperglycemia 06/08/2015    Priority: Medium  . CKD (chronic kidney disease), stage III ([place]) 10/24/2014    Priority: Medium  . Sleep apnea 07/02/2008    Priority: Medium  . Hyperlipidemia 06/14/2007    Priority: Medium  . Essential hypertension 06/14/2007    Priority: Medium  . History of colonic polyps 06/14/2007    Priority: Medium  . Postherpetic neuralgia 03/19/2018    Priority: Low  . Balanitis 03/06/2017    Priority: Low  . OA (osteoarthritis) of knee 12/07/2015    Priority: Low  . Former smoker 06/08/2015    Priority: Low  . Edema 02/04/2015    Priority: Low  . Allergic rhinitis 10/24/2014    Priority: Low  . Obesity (BMI 30-39.9) 04/25/2014    Priority: Low  . Right cervical radiculopathy 04/12/2013    Priority: Low  . Low back pain 01/11/2008    Priority: Low  . GERD 11/07/2007    Priority: Low  . Trigger finger, acquired 10/23/2007    Priority: Low  . Actinic keratosis 10/10/2018    Medications- reviewed and updated Current Outpatient Medications  Medication Sig Dispense Refill  . atenolol (TENORMIN) 25 MG tablet Take 1 tablet (25 mg total) by mouth daily. 90 tablet 3   . atorvastatin (LIPITOR) 10 MG tablet TAKE 1 TABLET EVERY DAY 90 tablet 3  . clopidogrel (PLAVIX) 75 MG tablet TAKE 1 TABLET EVERY DAY 90 tablet 3  . famotidine (PEPCID) 20 MG tablet Take 20 mg by mouth daily.    . fluticasone (FLONASE) 50 MCG/ACT nasal spray INHALE 1 SPRAY INTO EACH NOSTRIL ONCE DAILY. 16 g 2  . furosemide (LASIX) 20 MG tablet Take 1 tablet (20 mg total) by mouth daily as needed for edema. 30 tablet 6  . irbesartan-hydrochlorothiazide (AVALIDE) 150-12.5 MG tablet Take 2 tablets by mouth daily. 180 tablet 3  . mercaptopurine (PURINETHOL) 50 MG tablet TAKE 1 AND 1/2 TABLETS BY MOUTH DAILY. GIVE ON AN EMPTY STOMACH 1 HOUR BEFORE OR 2 HOURS AFTER A MEAL. 45 tablet 0  . nitroGLYCERIN (NITROSTAT) 0.4 MG SL tablet Place 1 tablet (0.4 mg total) under the tongue every 5 (five) minutes as needed for chest pain. 20 tablet 5  . potassium chloride (K-DUR) 10 MEQ tablet TAKE 1 TABLET EVERY DAY 90 tablet 0  . tamsulosin (FLOMAX) 0.4 MG CAPS capsule Take 0.4 mg by mouth daily.      No current facility-administered medications for this visit.      Objective:  BP (!) 160/94 (BP Location: Left Arm) Comment: Pt digital cuff  Pulse (!) 56   Temp 98 F (36.7 C)  Ht 5' 8"  (1.727 m)   Wt 201 lb 6.4 oz (91.4 kg)   SpO2 97%   BMI 30.62 kg/m  Gen: NAD, resting comfortably CV: RRR no murmurs rubs or gallops Lungs: CTAB no crackles, wheeze, rhonchi Abdomen: soft/nontender Ext: no edema Skin: warm, dry Neuro: grossly normal, normal speech    Assessment and Plan   #hypertension S: controlled poorly on atenolol 25 mg-up from 12.5 mg- in addition to irbesartan hydrochlorothiazide 150-12.5 mg daily.  Rarely using Lasix  Home cuff was verified-we repeated blood pressure and 147/84 his check and 146/82 my check  Patient wonders why blood pressure is increased-Similar activity level. No increased salt intake.  BP Readings from Last 3 Encounters:  04/24/19 (!) 160/94  04/10/19 (!) 148/75   01/10/19 122/74  A/P: Hypertension is poorly controlled-continue atenolol 25 mg for now (though may decrease to 12.5 mg if blood pressure very well controlled with below step as has been reduced in the past due to bradycardia) - We will increase irbesartan hydrochlorothiazide 150-12.5 mg to 2 tablets daily for total of irbesartan 300 mg and hydrochlorothiazide 25 mg -2 to 3-week update through my chart recommended -No edema or weight gain to suggest need for Lasix or fluid overload - Offered updated blood work such as BMP and TSH but patient declined.  Would also be reasonable to get CBC based on ulcerative colitis history  ULCERATIVE COLITIS-LEFT SIDE S: Patient follows with Dr. Fuller Plan- no recent issues while taking mercaptopurine A/P:  Stable. Continue current medications.    27-monthfollow-up recommended if blood pressure returns to normal-would be due for blood work at that time Future Appointments  Date Time Provider DQuail Ridge 10/16/2019  9:20 AM HYong Channel SBrayton Mars MD [facility]   Lab/Order associations: Essential hypertension  ULCERATIVE COLITIS-LEFT SIDE  Meds ordered this encounter  Medications  . irbesartan-hydrochlorothiazide (AVALIDE) 150-12.5 MG tablet    Sig: Take 2 tablets by mouth daily.    Dispense:  180 tablet    Refill:  3   Return precautions advised.  SGarret Reddish MD Okay none I will great

## 2019-05-11 ENCOUNTER — Other Ambulatory Visit: Payer: Self-pay | Admitting: Family Medicine

## 2019-05-29 ENCOUNTER — Other Ambulatory Visit: Payer: Self-pay | Admitting: Gastroenterology

## 2019-06-18 DIAGNOSIS — L570 Actinic keratosis: Secondary | ICD-10-CM | POA: Diagnosis not present

## 2019-06-18 DIAGNOSIS — X32XXXD Exposure to sunlight, subsequent encounter: Secondary | ICD-10-CM | POA: Diagnosis not present

## 2019-06-27 ENCOUNTER — Other Ambulatory Visit: Payer: Self-pay | Admitting: Gastroenterology

## 2019-06-27 ENCOUNTER — Telehealth: Payer: Self-pay | Admitting: Family Medicine

## 2019-06-27 NOTE — Telephone Encounter (Signed)
I left a message asking the patient to call me at [phone] to schedule AWV with Courtney. VDM (Dee-Dee) °

## 2019-06-28 ENCOUNTER — Telehealth: Payer: Self-pay | Admitting: *Deleted

## 2019-06-28 ENCOUNTER — Other Ambulatory Visit: Payer: Self-pay | Admitting: Gastroenterology

## 2019-06-28 MED ORDER — MERCAPTOPURINE 50 MG PO TABS: ORAL_TABLET | ORAL | 0 refills | Status: DC

## 2019-06-28 NOTE — Telephone Encounter (Signed)
Refill sent in as requested. 

## 2019-06-28 NOTE — Telephone Encounter (Signed)
Refilled patients 6MP but he will need a follow up visit scheduled.  Called patient but no answer

## 2019-07-03 ENCOUNTER — Ambulatory Visit (INDEPENDENT_AMBULATORY_CARE_PROVIDER_SITE_OTHER): Payer: Medicare HMO

## 2019-07-03 ENCOUNTER — Ambulatory Visit: Payer: Medicare HMO

## 2019-07-03 ENCOUNTER — Other Ambulatory Visit: Payer: Self-pay

## 2019-07-03 VITALS — BP 122/70 | HR 56

## 2019-07-03 DIAGNOSIS — Z0000 Encounter for general adult medical examination without abnormal findings: Secondary | ICD-10-CM

## 2019-07-03 NOTE — Progress Notes (Signed)
I have reviewed and agree with note, evaluation, plan.   Blood pressure home average 138.9/78.1 and BP controlled today- both are under goal- continue to monitor.   Courtney- did not see comment on cognitive function- please note if he had any cognitive concerns (or point me towards comment in note)  Garret Reddish, MD

## 2019-07-03 NOTE — Progress Notes (Addendum)
This visit is being conducted via phone call due to the COVID-19 pandemic. This patient has given me verbal consent via phone to conduct this visit, patient states they are participating from their home address. Some vital signs may be absent or patient reported.   Patient identification: identified by name, DOB, and current address.   Subjective:   [name] is a 73 y.o. male who presents for Medicare Annual/Subsequent preventive examination.  Review of Systems:   Cardiac Risk Factors include: advanced age (>75mn, >>12women);hypertension;male gender     Objective:    Vitals: BP 122/70   Pulse (!) 56   There is no height or weight on file to calculate BMI.  Advanced Directives 07/03/2019 03/07/2018 03/01/2018 10/18/2016 01/15/2015  Does Patient Have a Medical Advance Directive? _0   Would patient like information on creating a medical advance directive? Yes (MAU/Ambulatory/Procedural Areas - Information given) No - Patient declined - - No - patient declined information    Tobacco Social History   Tobacco Use  Smoking Status Former Smoker  . Packs/day: 1.00  . Years: 3.00  . Pack years: 3.00  . Types: Cigarettes  . Quit date: 11/14/1966  . Years since quitting: 52.6  Smokeless Tobacco Former USystems developer . Types: Chew  Tobacco Comment   quit 50 years ago     Counseling given: Not Answered Comment: quit 50 years ago   Clinical Intake:  Pre-visit preparation completed: Yes  Pain : No/denies pain  Nutritional Status: BMI > 30  Obese Diabetes: No  How often do you need to have someone help you when you read instructions, pamphlets, or other written materials from your doctor or pharmacy?: 2 - Rarely  Interpreter Needed?: No  Information entered by :: CDenman GeorgeLPN  Past Medical History:  Diagnosis Date  . Adenomatous polyps 03/2004, 11/2012  . Arthritis   . Bradycardia 2018  . Clotting disorder (HCC)    on plavix  . Coronary atherosclerosis of  unspecified type of vessel, native or graft   . Esophagitis 1993  . GERD (gastroesophageal reflux disease)   . Hyperlipidemia   . Hypertension   . [place]  . Shingles 2019  . Sleep apnea    cpap  . [place]  . Trigger finger (acquired)   . 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 UTI (urinary tract infection) 2019   Past Surgical History:  Procedure Laterality Date  . ANGIOPLASTY  1993  . CHOLECYSTECTOMY  2002  . LUMBAR FUSION  1983  . ROTATOR CUFF REPAIR  2009   right  . TRIGGER FINGER RELEASE     right   Family History  Problem Relation Age of Onset  . Stroke Mother   . COPD Father   . Heart disease Sister        valve replacement  . CAD Brother   . Heart attack Brother   . Colon cancer Neg Hx   . Esophageal cancer Neg Hx   . Liver disease Neg Hx   . Diabetes Neg Hx   . Rectal cancer Neg Hx    Social History   Socioeconomic History  . Marital status: Widowed    Spouse name: Not on file  . Number of children: 3  . Years of education: Not on file  . Highest education level: Not on file  Occupational History  . Occupation: apartment maintenance    Employer: [employer]  Comment: retired  Scientific laboratory technician  . Financial resource strain: Not on file  . Food insecurity    Worry: Not on file    Inability: Not on file  . Transportation needs    Medical: Not on file    Non-medical: Not on file  Tobacco Use  . Smoking status: Former Smoker    Packs/day: 1.00    Years: 3.00    Pack years: 3.00    Types: Cigarettes    Quit date: 11/14/1966    Years since quitting: 52.6  . Smokeless tobacco: Former Systems developer    Types: Chew  . Tobacco comment: quit 50 years ago  Substance and Sexual Activity  . Alcohol use: No    Alcohol/week: 0.0 standard drinks  . Drug use: No  . Sexual activity: Not on file  Lifestyle  . Physical activity    Days per week: Not on file    Minutes per session: Not on file  . Stress:  Not on file  Relationships  . Social Herbalist on phone: Not on file    Gets together: Not on file    Attends religious service: Not on file    Active member of club or organization: Not on file    Attends meetings of clubs or organizations: Not on file    Relationship status: Not on file  Other Topics Concern  . Not on file  Social History Narrative   Widower January 2019.. 3 children- son is a patient here. 4 grandkids.       Retired from Deal[employer]      Hobbies: work on an old truck- expensive to work on so a little at a time, mow the kids yards, enjoys yardwork      Pt does not get regular exercise--daily caffeine use-5 cups daily    Outpatient Encounter Medications as of 07/03/2019  Medication Sig  . atenolol (TENORMIN) 25 MG tablet Take 1 tablet (25 mg total) by mouth daily.  Marland Kitchen atorvastatin (LIPITOR) 10 MG tablet TAKE 1 TABLET EVERY DAY  . clopidogrel (PLAVIX) 75 MG tablet TAKE 1 TABLET EVERY DAY  . famotidine (PEPCID) 20 MG tablet Take 20 mg by mouth daily.  . fluticasone (FLONASE) 50 MCG/ACT nasal spray INHALE 1 SPRAY INTO EACH NOSTRIL ONCE DAILY.  Marland Kitchen irbesartan-hydrochlorothiazide (AVALIDE) 150-12.5 MG tablet Take 2 tablets by mouth daily.  . mercaptopurine (PURINETHOL) 50 MG tablet Give on an empty stomach 1 hour before or 2 hours after meals. Caution: Chemotherapy.  . nitroGLYCERIN (NITROSTAT) 0.4 MG SL tablet Place 1 tablet (0.4 mg total) under the tongue every 5 (five) minutes as needed for chest pain.  . potassium chloride (K-DUR) 10 MEQ tablet TAKE 1 TABLET EVERY DAY  . tamsulosin (FLOMAX) 0.4 MG CAPS capsule Take 0.4 mg by mouth daily.   . furosemide (LASIX) 20 MG tablet Take 1 tablet (20 mg total) by mouth daily as needed for edema. (Patient not taking: Reported on 07/03/2019)   No facility-administered encounter medications on file as of 07/03/2019.     Activities of Daily Living In your present state of health, do you have any  difficulty performing the following activities: 07/03/2019  Hearing? N  Vision? N  Difficulty concentrating or making decisions? N  Walking or climbing stairs? N  Dressing or bathing? N  Doing errands, shopping? N  Preparing Food and eating ? N  Using the Toilet? N  In the past six months, have you accidently leaked urine? N  Do you have problems with loss of bowel control? N  Managing your Medications? N  Managing your Finances? N  Housekeeping or managing your Housekeeping? N  Some recent data might be hidden    Patient Care Team: Marin Olp, MD as PCP - General (Family Medicine) Fay Records, MD as PCP - Cardiology (Cardiology)   Assessment:   This is a routine wellness examination for Curt.  Exercise Activities and Dietary recommendations Current Exercise Habits: The patient does not participate in regular exercise at present(does weekly yardwork)  Goals    . Blood Pressure < 140/90    . Exercise 150 minutes per week (moderate activity)     Will try to get back on the silver sneaker program and occasionally walk on the treadmill once double vision is cleared   Older adults aged 44 or older, who are generally fit and have no health conditions that limit their mobility, should try to be active daily and should do: at least 150 minutes of moderate aerobic activity such as cycling or walking every week, and  strength exercises on two or more days a week that work all the major muscles (legs, hips, back, abdomen, chest, shoulders and arms).      OR  75 minutes of vigorous aerobic activity such as running or a game of singles tennis every week, and  strength exercises on two or more days a week that work all the major muscles (legs, hips, back, abdomen, chest, shoulders and arms).     OR  a mix of moderate and vigorous aerobic activity every week. For example, two 30-minute runs, plus 30 minutes of fast walking, equates to 150 minutes of moderate aerobic activity, and   strength exercises on two or more days a week that work all the major muscles (legs, hips, back, abdomen, chest, shoulders and arms).  A rule of thumb is that one minute of vigorous activity provides the same health benefits as two minutes of moderate activity. You should also try to break up long periods of sitting with light activity, as sedentary behaviour is now considered an independent risk factor for ill health, no matter how much exercise you do. Find out why sitting is bad for your health. Older adults at risk of falls, such as people with weak legs, poor balance and some medical conditions, should do exercises to improve balance and co-ordination on at least two days a week. Examples include yoga, tai chi and dancing.      . Weight (lb) < 200 lb (90.7 kg)     Try to eat less sugar; cookies and cakes      . Weight (lb) < 200 lb (90.7 kg)     Losing weight will help with your PRE Diabetes and help you to feel better        Fall Risk Fall Risk  07/03/2019 10/24/2018 10/10/2018 03/01/2018 10/26/2017  Falls in the past year? 0 - 0 No No  Number falls in past yr: 0 0 - - -  Injury with Fall? 0 - - - -  Follow up Education provided - - - -    Depression Screen PHQ 2/9 Scores 10/24/2018 10/10/2018 03/01/2018 10/26/2017  PHQ - 2 Score 0 1 0 0    Cognitive Function MMSE - Mini Mental State Exam 03/01/2018 10/18/2016  Not completed: (No Data) -  Orientation to time - 5  Orientation to Place - 5  Registration - 3  Attention/ Calculation - 5  Recall - 3  Language- name 2 objects - 2  Language- repeat - 1  Language- follow 3 step command - 3  Language- read & follow direction - 1  Write a sentence - 1  Copy design - 1  Total score - 30       Cognitive Testing  Alert? Yes         Normal Appearance?  Oriented to person? Yes           Place? Yes  Time? Yes  Recall of three objects? Yes  Can perform simple calculations? Yes  Displays appropriate judgment? Yes  Can read the  correct time from a watch face? Yes   No cognitive concerns at this time   Immunization History  Administered Date(s) Administered  . Influenza Split 08/19/2011, 08/17/2012  . Influenza Whole 10/23/2007, 08/14/2009, 08/13/2010  . Influenza, High Dose Seasonal PF 09/13/2016, 09/01/2017, 08/22/2018  . Influenza,inj,Quad PF,6+ Mos 08/16/2013  . Influenza-Unspecified 08/28/2014, 09/07/2015, 09/01/2017, 09/13/2018  . Pneumococcal Conjugate-13 06/08/2015  . Pneumococcal Polysaccharide-23 04/25/2014  . Td 11/14/2000, 09/21/2010  . Zoster Recombinat (Shingrix) 09/24/2018    Qualifies for Shingles Vaccine? Discussed and patient will check with pharmacy for coverage.  Patient education handout provided    Screening Tests Health Maintenance  Topic Date Due  . INFLUENZA VACCINE  06/15/2019  . TETANUS/TDAP  09/21/2020  . COLONOSCOPY  03/07/2021  . Hepatitis C Screening  Completed  . PNA vac Low Risk Adult  Completed   Cancer Screenings: Colorectal: up to date last colonoscopy with Dr. Fuller Plan 03/07/18     Plan:    I have personally reviewed and addressed the Medicare Annual Wellness questionnaire and have noted the following in the patient's chart:  A. Medical and social history B. Use of alcohol, tobacco or illicit drugs  C. Current medications and supplements D. Functional ability and status E.  Nutritional status F.  Physical activity G. Advance directives H. List of other physicians I.  Hospitalizations, surgeries, and ER visits in previous 12 months J.  [place] such as hearing and vision if needed, cognitive and depression L. Referrals, records requested, and appointments- none   In addition, I have reviewed and discussed with patient certain preventive protocols, quality metrics, and best practice recommendations. A written personalized care plan for preventive services as well as general preventive health recommendations were provided to patient.   Signed,   Denman George, LPN  Nurse Health Advisor   Nurse Notes:  Patient reported home blood pressure monitoring results as such below    04/25/19 141/77  04/26/19 136/79  04/27/19 144/82  04/28/19 136/76  04/29/19 127/73  05/01/19 143/84  05/02/19 128/73  05/03/19 144/80  05/05/19 131/75  05/06/19 126/74   05/09/19 152/77   05/10/19 151/79   05/11/19 157/88  05/12/19 156/83  05/13/19 120/65   05/14/19 127/82

## 2019-07-03 NOTE — Patient Instructions (Signed)
[name] , Thank you for taking time to come for your Medicare Wellness Visit. I appreciate your ongoing commitment to your health goals. Please review the following plan we discussed and let me know if I can assist you in the future.   Screening recommendations/referrals: Colorectal Screening: up to date; last with Dr. Fuller Plan 03/07/18  Vision and Dental Exams: Recommended annual ophthalmology exams for early detection of glaucoma and other disorders of the eye Recommended annual dental exams for proper oral hygiene  Vaccinations: Influenza vaccine:  recommended this fall either at PCP office or through your local pharmacy  Pneumococcal vaccine: completed 06/08/15 Tdap vaccine: last 09/21/10 due 09/2020  Shingles vaccine: Please call your insurance company to determine your out of pocket expense for the Shingrix vaccine. You may receive this vaccine at your local pharmacy.  Advanced directives: Advance directives discussed with you today. I have provided a copy for you to complete at home and have notarized. Once this is complete please bring a copy in to our office so we can scan it into your chart.  Goals: Reduce sodium intake to improve blood pressure   Next appointment: Please schedule your Annual Wellness Visit with your Nurse Health Advisor in one year.  Preventive Care 72 Years and Older, Male Preventive care refers to lifestyle choices and visits with your health care provider that can promote health and wellness. What does preventive care include?  A yearly physical exam. This is also called an annual well check.  Dental exams once or twice a year.  Routine eye exams. Ask your health care provider how often you should have your eyes checked.  Personal lifestyle choices, including:  Daily care of your teeth and gums.  Regular physical activity.  Eating a healthy diet.  Avoiding tobacco and drug use.  Limiting alcohol use.  Practicing safe sex.  Taking low doses of  aspirin every day if recommended by your health care provider..  Taking vitamin and mineral supplements as recommended by your health care provider. What happens during an annual well check? The services and screenings done by your health care provider during your annual well check will depend on your age, overall health, lifestyle risk factors, and family history of disease. Counseling  Your health care provider may ask you questions about your:  Alcohol use.  Tobacco use.  Drug use.  Emotional well-being.  Home and relationship well-being.  Sexual activity.  Eating habits.  History of falls.  Memory and ability to understand (cognition).  Work and work Statistician. Screening  You may have the following tests or measurements:  Height, weight, and BMI.  Blood pressure.  Lipid and cholesterol levels. These may be checked every 5 years, or more frequently if you are over 11 years old.  Skin check.  Lung cancer screening. You may have this screening every year starting at age 53 if you have a 30-pack-year history of smoking and currently smoke or have quit within the past 15 years.  Fecal occult blood test (FOBT) of the stool. You may have this test every year starting at age 81.  Flexible sigmoidoscopy or colonoscopy. You may have a sigmoidoscopy every 5 years or a colonoscopy every 10 years starting at age 39.  Prostate cancer screening. Recommendations will vary depending on your family history and other risks.  Hepatitis C blood test.  Hepatitis B blood test.  Sexually transmitted disease (STD) testing.  Diabetes screening. This is done by checking your blood sugar (glucose) after you have  not eaten for a while (fasting). You may have this done every 1-3 years.  Abdominal aortic aneurysm (AAA) screening. You may need this if you are a current or former smoker.  Osteoporosis. You may be screened starting at age 50 if you are at high risk. Talk with your health  care provider about your test results, treatment options, and if necessary, the need for more tests. Vaccines  Your health care provider may recommend certain vaccines, such as:  Influenza vaccine. This is recommended every year.  Tetanus, diphtheria, and acellular pertussis (Tdap, Td) vaccine. You may need a Td booster every 10 years.  Zoster vaccine. You may need this after age 76.  Pneumococcal 13-valent conjugate (PCV13) vaccine. One dose is recommended after age 63.  Pneumococcal polysaccharide (PPSV23) vaccine. One dose is recommended after age 34. Talk to your health care provider about which screenings and vaccines you need and how often you need them. This information is not intended to replace advice given to you by your health care provider. Make sure you discuss any questions you have with your health care provider. Document Released: 11/27/2015 Document Revised: 07/20/2016 Document Reviewed: 09/01/2015 Elsevier Interactive Patient Education  2017 [place] Prevention in the Home Falls can cause injuries. They can happen to people of all ages. There are many things you can do to make your home safe and to help prevent falls. What can I do on the outside of my home?  Regularly fix the edges of walkways and driveways and fix any cracks.  Remove anything that might make you trip as you walk through a door, such as a raised step or threshold.  Trim any bushes or trees on the path to your home.  Use bright outdoor lighting.  Clear any walking paths of anything that might make someone trip, such as rocks or tools.  Regularly check to see if handrails are loose or broken. Make sure that both sides of any steps have handrails.  Any raised decks and porches should have guardrails on the edges.  Have any leaves, snow, or ice cleared regularly.  Use sand or salt on walking paths during winter.  Clean up any spills in your garage right away. This includes oil or  grease spills. What can I do in the bathroom?  Use night lights.  Install grab bars by the toilet and in the tub and shower. Do not use towel bars as grab bars.  Use non-skid mats or decals in the tub or shower.  If you need to sit down in the shower, use a plastic, non-slip stool.  Keep the floor dry. Clean up any water that spills on the floor as soon as it happens.  Remove soap buildup in the tub or shower regularly.  Attach bath mats securely with double-sided non-slip rug tape.  Do not have throw rugs and other things on the floor that can make you trip. What can I do in the bedroom?  Use night lights.  Make sure that you have a light by your bed that is easy to reach.  Do not use any sheets or blankets that are too big for your bed. They should not hang down onto the floor.  Have a firm chair that has side arms. You can use this for support while you get dressed.  Do not have throw rugs and other things on the floor that can make you trip. What can I do in the kitchen?  Clean up any  spills right away.  Avoid walking on wet floors.  Keep items that you use a lot in easy-to-reach places.  If you need to reach something above you, use a strong step stool that has a grab bar.  Keep electrical cords out of the way.  Do not use floor polish or wax that makes floors slippery. If you must use wax, use non-skid floor wax.  Do not have throw rugs and other things on the floor that can make you trip. What can I do with my stairs?  Do not leave any items on the stairs.  Make sure that there are handrails on both sides of the stairs and use them. Fix handrails that are broken or loose. Make sure that handrails are as long as the stairways.  Check any carpeting to make sure that it is firmly attached to the stairs. Fix any carpet that is loose or worn.  Avoid having throw rugs at the top or bottom of the stairs. If you do have throw rugs, attach them to the floor with  carpet tape.  Make sure that you have a light switch at the top of the stairs and the bottom of the stairs. If you do not have them, ask someone to add them for you. What else can I do to help prevent falls?  Wear shoes that:  Do not have high heels.  Have rubber bottoms.  Are comfortable and fit you well.  Are closed at the toe. Do not wear sandals.  If you use a stepladder:  Make sure that it is fully opened. Do not climb a closed stepladder.  Make sure that both sides of the stepladder are locked into place.  Ask someone to hold it for you, if possible.  Clearly mark and make sure that you can see:  Any grab bars or handrails.  First and last steps.  Where the edge of each step is.  Use tools that help you move around (mobility aids) if they are needed. These include:  Canes.  Walkers.  Scooters.  Crutches.  Turn on the lights when you go into a dark area. Replace any light bulbs as soon as they burn out.  Set up your furniture so you have a clear path. Avoid moving your furniture around.  If any of your floors are uneven, fix them.  If there are any pets around you, be aware of where they are.  Review your medicines with your doctor. Some medicines can make you feel dizzy. This can increase your chance of falling. Ask your doctor what other things that you can do to help prevent falls. This information is not intended to replace advice given to you by your health care provider. Make sure you discuss any questions you have with your health care provider. Document Released: 08/27/2009 Document Revised: 04/07/2016 Document Reviewed: 12/05/2014 Elsevier Interactive Patient Education  2017 [employer].

## 2019-07-09 NOTE — Progress Notes (Signed)
Note addended to include cognitive assessment

## 2019-08-12 ENCOUNTER — Ambulatory Visit: Payer: Medicare HMO | Admitting: Gastroenterology

## 2019-08-12 ENCOUNTER — Other Ambulatory Visit: Payer: Self-pay

## 2019-08-12 ENCOUNTER — Encounter: Payer: Self-pay | Admitting: Gastroenterology

## 2019-08-12 VITALS — BP 108/70 | HR 63 | Temp 97.4°F | Ht 68.0 in | Wt 199.4 lb

## 2019-08-12 DIAGNOSIS — Z79899 Other long term (current) drug therapy: Secondary | ICD-10-CM | POA: Diagnosis not present

## 2019-08-12 DIAGNOSIS — K5150 Left sided colitis without complications: Secondary | ICD-10-CM

## 2019-08-12 DIAGNOSIS — Z86010 Personal history of colonic polyps: Secondary | ICD-10-CM

## 2019-08-12 MED ORDER — MERCAPTOPURINE 50 MG PO TABS: ORAL_TABLET | ORAL | 5 refills | Status: DC

## 2019-08-12 NOTE — Progress Notes (Signed)
    History of Present Illness: This is a 73 year old male with left-sided ulcerative colitis. He has no digestive complaints. Denies weight loss, abdominal pain, constipation, diarrhea, change in stool caliber, melena, hematochezia, nausea, vomiting, dysphagia, reflux symptoms, chest pain.  Colonoscopy 02/2018 - Two 6 to 7 mm polyps in the sigmoid colon and in the ascending colon, removed with a cold snare. Resected and retrieved. Tubulare adenoma and hyperplastic polyps  - The examination was otherwise normal on direct and retroflexion views. Biopsies obtained. Normal mucosa.   Current Medications, Allergies, Past Medical History, Past Surgical History, Family History and Social History were reviewed in [employer] record.   Physical Exam: General: Well developed, well nourished, no acute distress Head: Normocephalic and atraumatic Eyes:  sclerae anicteric, EOMI Ears: Normal auditory acuity Mouth: No deformity or lesions Lungs: Clear throughout to auscultation Heart: Regular rate and rhythm; no murmurs, rubs or bruits Abdomen: Soft, non tender and non distended. No masses, hepatosplenomegaly or hernias noted. Normal Bowel sounds Rectal: Not done Musculoskeletal: Symmetrical with no gross deformities  Pulses:  Normal pulses noted Extremities: No clubbing, cyanosis, edema or deformities noted Neurological: Alert oriented x 4, grossly nonfocal Psychological:  Alert and cooperative. Normal mood and affect   Assessment and Recommendations:  1.  Left-sided ulcerative colitis, asymptomatic, maintained on 6-MP 75 mg daily.  A 3-year interval surveillance colonoscopy is recommended. Blood work with PCP visit on 10/16/2019. REV in 6 months.   2.  Personal history of adenomatous colon polyps.  A 3-year interval surveillance colonoscopy is recommended.

## 2019-08-12 NOTE — Patient Instructions (Signed)
We have sent the following medications to your pharmacy for you to pick up at your convenience: mercaptopurine.   Thank you for choosing me and [facility].  Pricilla Riffle. Dagoberto Ligas., MD., Marval Regal

## 2019-10-03 ENCOUNTER — Other Ambulatory Visit: Payer: Self-pay | Admitting: Gastroenterology

## 2019-10-14 ENCOUNTER — Other Ambulatory Visit: Payer: Self-pay | Admitting: Family Medicine

## 2019-10-15 ENCOUNTER — Other Ambulatory Visit: Payer: Self-pay

## 2019-10-16 ENCOUNTER — Ambulatory Visit (INDEPENDENT_AMBULATORY_CARE_PROVIDER_SITE_OTHER): Payer: Medicare HMO | Admitting: Family Medicine

## 2019-10-16 ENCOUNTER — Encounter: Payer: Self-pay | Admitting: Family Medicine

## 2019-10-16 VITALS — BP 118/80 | HR 51 | Temp 98.3°F | Ht 68.0 in | Wt 202.2 lb

## 2019-10-16 DIAGNOSIS — E785 Hyperlipidemia, unspecified: Secondary | ICD-10-CM | POA: Diagnosis not present

## 2019-10-16 DIAGNOSIS — Z87891 Personal history of nicotine dependence: Secondary | ICD-10-CM

## 2019-10-16 DIAGNOSIS — K5150 Left sided colitis without complications: Secondary | ICD-10-CM | POA: Diagnosis not present

## 2019-10-16 DIAGNOSIS — Z125 Encounter for screening for malignant neoplasm of prostate: Secondary | ICD-10-CM

## 2019-10-16 DIAGNOSIS — Z0000 Encounter for general adult medical examination without abnormal findings: Secondary | ICD-10-CM

## 2019-10-16 DIAGNOSIS — I10 Essential (primary) hypertension: Secondary | ICD-10-CM

## 2019-10-16 DIAGNOSIS — I2510 Atherosclerotic heart disease of native coronary artery without angina pectoris: Secondary | ICD-10-CM | POA: Diagnosis not present

## 2019-10-16 DIAGNOSIS — R739 Hyperglycemia, unspecified: Secondary | ICD-10-CM | POA: Diagnosis not present

## 2019-10-16 DIAGNOSIS — Z86010 Personal history of colonic polyps: Secondary | ICD-10-CM

## 2019-10-16 DIAGNOSIS — G4730 Sleep apnea, unspecified: Secondary | ICD-10-CM

## 2019-10-16 DIAGNOSIS — K219 Gastro-esophageal reflux disease without esophagitis: Secondary | ICD-10-CM

## 2019-10-16 DIAGNOSIS — N183 Chronic kidney disease, stage 3 unspecified: Secondary | ICD-10-CM

## 2019-10-16 LAB — COMPREHENSIVE METABOLIC PANEL
ALT: 24 U/L (ref 0–53)
AST: 17 U/L (ref 0–37)
Albumin: 4 g/dL (ref 3.5–5.2)
Alkaline Phosphatase: 53 U/L (ref 39–117)
BUN: 11 mg/dL (ref 6–23)
CO2: 33 mEq/L — ABNORMAL HIGH (ref 19–32)
Calcium: 9.7 mg/dL (ref 8.4–10.5)
Chloride: 100 mEq/L (ref 96–112)
Creatinine, Ser: 1.18 mg/dL (ref 0.40–1.50)
GFR: 60.6 mL/min (ref >60.00)
Glucose, Bld: 93 mg/dL (ref 70–99)
Potassium: 3.8 mEq/L (ref 3.5–5.1)
Sodium: 140 mEq/L (ref 135–145)
Total Bilirubin: 1 mg/dL (ref 0.2–1.2)
Total Protein: 6.9 g/dL (ref 6.0–8.3)

## 2019-10-16 LAB — CBC WITH DIFFERENTIAL/PLATELET
Basophils Absolute: 0 10*3/uL (ref 0.0–0.1)
Basophils Relative: 0.8 % (ref 0.0–3.0)
Eosinophils Absolute: 0.2 10*3/uL (ref 0.0–0.7)
Eosinophils Relative: 3.2 % (ref 0.0–5.0)
HCT: 45.5 % (ref 39.0–52.0)
Hemoglobin: 15.6 g/dL (ref 13.0–17.0)
Lymphocytes Relative: 29.2 % (ref 12.0–46.0)
Lymphs Abs: 1.8 10*3/uL (ref 0.7–4.0)
MCHC: 34.2 g/dL (ref 30.0–36.0)
MCV: 90.6 fl (ref 78.0–100.0)
Monocytes Absolute: 0.6 10*3/uL (ref 0.1–1.0)
Monocytes Relative: 10 % (ref 3.0–12.0)
Neutro Abs: 3.5 10*3/uL (ref 1.4–7.7)
Neutrophils Relative %: 56.8 % (ref 43.0–77.0)
Platelets: 206 10*3/uL (ref 150.0–400.0)
RBC: 5.02 Mil/uL (ref 4.22–5.81)
RDW: 15.4 % (ref 11.5–15.5)
WBC: 6.2 10*3/uL (ref 4.0–10.5)

## 2019-10-16 LAB — LIPID PANEL
Cholesterol: 116 mg/dL (ref 0–200)
HDL: 30.2 mg/dL — ABNORMAL LOW (ref >39.00)
LDL Cholesterol: 64 mg/dL (ref 0–99)
NonHDL: 85.68
Total CHOL/HDL Ratio: 4
Triglycerides: 108 mg/dL (ref 0.0–149.0)
VLDL: 21.6 mg/dL (ref 0.0–40.0)

## 2019-10-16 LAB — PSA: PSA: 3.17 ng/mL (ref 0.10–4.00)

## 2019-10-16 LAB — HEMOGLOBIN A1C: Hgb A1c MFr Bld: 5.8 % (ref 4.6–6.5)

## 2019-10-16 MED ORDER — ACYCLOVIR 5 % EX OINT: TOPICAL_OINTMENT | CUTANEOUS | 2 refills | Status: DC

## 2019-10-16 MED ORDER — FLUTICASONE PROPIONATE 50 MCG/ACT NA SUSP: NASAL | 5 refills | Status: DC

## 2019-10-16 MED ORDER — CLOPIDOGREL BISULFATE 75 MG PO TABS: 75.0000 mg | ORAL_TABLET | Freq: Every day | ORAL | 3 refills | Status: DC

## 2019-10-16 MED ORDER — ATENOLOL 25 MG PO TABS: 12.5000 mg | ORAL_TABLET | Freq: Every day | ORAL | 3 refills | Status: DC

## 2019-10-16 MED ORDER — ATORVASTATIN CALCIUM 10 MG PO TABS: 10.0000 mg | ORAL_TABLET | Freq: Every day | ORAL | 3 refills | Status: DC

## 2019-10-16 MED ORDER — IRBESARTAN-HYDROCHLOROTHIAZIDE 150-12.5 MG PO TABS: 2.0000 | ORAL_TABLET | Freq: Every day | ORAL | 3 refills | Status: DC

## 2019-10-16 NOTE — Patient Instructions (Addendum)
Please stop by lab before you go If you do not have mychart- we will call you about results within 5 business days of [place] receiving them.  If you have mychart- we will send your results within 3 business days of [place] receiving them.  If abnormal or we want to clarify a result, we will call or mychart you to make sure you receive the message.  If you have questions or concerns or don't hear within 5-7 days, please send [place] a message or call us.   blood pressure looks really good but HR slower than we would like- we will continue 2 tablets of irbesartan-hctz but reduce atenolol 98m to just half tablet so 12.51m - monitor at home and as long as blood pressure remains 138/88 or less- see each other back in 6 months- if goes up let me know sooner than that.

## 2019-10-16 NOTE — Assessment & Plan Note (Signed)
Essential hypertension-compliant with atenolol 25 mg and irbesartan hydrochlorothiazide 150-12.5 mg.  - blood pressure looks really good but HR slower than we would like- we will continue 2 tablets of irbesartan-hctz but reduce atenolol 67m to just half tablet so 12.54m

## 2019-10-16 NOTE — Progress Notes (Addendum)
Phone: [phone]   Subjective:  Patient presents today for their annual physical. Chief complaint-noted.   See problem oriented charting- ROS- full  review of systems was completed and negative  except for: dental problems, seasonal allergiesg  The following were reviewed and entered/updated in epic: Past Medical History:  Diagnosis Date  . Adenomatous polyps 03/2004, 11/2012  . Arthritis   . Bradycardia 2018  . Clotting disorder (HCC)    on plavix  . Coronary atherosclerosis of unspecified type of vessel, native or graft   . Esophagitis 1993  . GERD (gastroesophageal reflux disease)   . Hyperlipidemia   . Hypertension   . Myocardial infarction ([place]) 1993  . Shingles 2019  . Sleep apnea    cpap  . Stroke ([place]) 02/2014  . Trigger finger (acquired)   . 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 UTI (urinary tract infection) 2019   Patient Active Problem List   Diagnosis Date Noted  . ULCERATIVE COLITIS-LEFT SIDE 07/03/2008    Priority: High  . CAD (coronary artery disease) 06/15/2007    Priority: High  . Hyperglycemia 06/08/2015    Priority: Medium  . CKD (chronic kidney disease), stage III 10/24/2014    Priority: Medium  . Sleep apnea 07/02/2008    Priority: Medium  . Hyperlipidemia 06/14/2007    Priority: Medium  . Essential hypertension 06/14/2007    Priority: Medium  . History of colonic polyps 06/14/2007    Priority: Medium  . Postherpetic neuralgia 03/19/2018    Priority: Low  . Balanitis 03/06/2017    Priority: Low  . OA (osteoarthritis) of knee 12/07/2015    Priority: Low  . Former smoker 06/08/2015    Priority: Low  . Edema 02/04/2015    Priority: Low  . Allergic rhinitis 10/24/2014    Priority: Low  . Obesity (BMI 30-39.9) 04/25/2014    Priority: Low  . Right cervical radiculopathy 04/12/2013    Priority: Low  . Low back pain 01/11/2008    Priority: Low  . GERD 11/07/2007    Priority: Low  . Trigger finger, acquired 10/23/2007     Priority: Low  . Actinic keratosis 10/10/2018   Past Surgical History:  Procedure Laterality Date  . ANGIOPLASTY  1993  . CHOLECYSTECTOMY  2002  . LUMBAR FUSION  1983  . ROTATOR CUFF REPAIR  2009   right  . TRIGGER FINGER RELEASE     right    Family History  Problem Relation Age of Onset  . Stroke Mother   . COPD Father   . Heart disease Sister        valve replacement  . CAD Brother   . Heart attack Brother   . Colon cancer Neg Hx   . Esophageal cancer Neg Hx   . Liver disease Neg Hx   . Diabetes Neg Hx   . Rectal cancer Neg Hx     Medications- reviewed and updated Current Outpatient Medications  Medication Sig Dispense Refill  . atenolol (TENORMIN) 25 MG tablet Take 0.5 tablets (12.5 mg total) by mouth daily. 45 tablet 3  . atorvastatin (LIPITOR) 10 MG tablet Take 1 tablet (10 mg total) by mouth daily. 90 tablet 3  . clopidogrel (PLAVIX) 75 MG tablet Take 1 tablet (75 mg total) by mouth daily. 90 tablet 3  . famotidine (PEPCID) 20 MG tablet Take 20 mg by mouth daily.    . fluticasone (FLONASE) 50 MCG/ACT nasal spray INHALE 1 SPRAY INTO EACH NOSTRIL ONCE DAILY. 16 g 5  .  irbesartan-hydrochlorothiazide (AVALIDE) 150-12.5 MG tablet Take 2 tablets by mouth daily. 180 tablet 3  . mercaptopurine (PURINETHOL) 50 MG tablet TAKE 1 AND 1/2 TABLETS BY MOUTH DAILY. GIVE ON AN EMPTY STOMACH 1 HOUR BEFORE OR 2 HOURS AFTER A MEAL. 45 tablet 0  . nitroGLYCERIN (NITROSTAT) 0.4 MG SL tablet Place 1 tablet (0.4 mg total) under the tongue every 5 (five) minutes as needed for chest pain. 20 tablet 5  . tamsulosin (FLOMAX) 0.4 MG CAPS capsule Take 0.4 mg by mouth daily.     Marland Kitchen acyclovir ointment (ZOVIRAX) 5 % Apply topically every 3 (three) hours. For fever blisters 15 g 2   No current facility-administered medications for this visit.     Allergies-reviewed and updated Allergies  Allergen Reactions  . Penicillins Rash    REACTION: rash    Social History   Social History  Narrative   Widower January 2019.. 3 children- son is a patient here. 4 grandkids.       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 TM normal. Bridge of nose normal. Eyelids normal.  Neck: no thyromegaly or cervical lymphadenopathy  CV: bradycardic but regular, no murmurs rubs or gallops Lungs: CTAB no crackles, wheeze, rhonchi Abdomen: soft/nontender/nondistended/normal bowel sounds. No rebound or guarding.  Ext: no edema Skin: warm, dry, raised lesion on right ear Neuro: grossly normal, moves all extremities, PERRLA Defer rectal to urology   Assessment and Plan  73 y.o. male presenting for annual physical.  Health Maintenance counseling: 1. Anticipatory guidance: Patient counseled regarding regular dental exams yes- q6 months- has some work needs to get done a few teeth to remove, eye exams yes- yearly,  avoiding smoking and second hand smoke -yes , limiting alcohol to 2 beverages per day (he doesn't drink)   2. Risk factor reduction:  Advised patient of need for regular exercise and diet rich and fruits and vegetables to reduce risk of heart attack and stroke. Exercise- walks a little- 0-1 days a week but tries to stay active working around the home. Encouraged more regular walking.  Diet-weight only up 1 lb since June which is not bad with covid- encouraged trying to eat a healthy/balanced diet. Encouraged more water over diet drinks.  Wt Readings from Last 3 Encounters:  10/16/19 202 lb 3.2 oz (91.7 kg)  08/12/19 199 lb 6 oz (90.4 kg)  04/24/19 201 lb 6.4 oz (91.4 kg)   3. Immunizations/screenings/ancillary studies-fully up-to-date  Immunization History  Administered Date(s) Administered  . Influenza Split 08/19/2011, 08/17/2012  . Influenza Whole 10/23/2007, 08/14/2009, 08/13/2010, 08/23/2019  . Influenza, High Dose Seasonal PF 09/13/2016, 09/01/2017, 08/22/2018, 08/21/2019  . Influenza,inj,Quad PF,6+ Mos 08/16/2013  . Influenza-Unspecified 08/28/2014, 09/07/2015, 09/01/2017, 09/13/2018  . Pneumococcal Conjugate-13 06/08/2015  . Pneumococcal Polysaccharide-23 04/25/2014  . Td 11/14/2000, 09/21/2010  . Zoster Recombinat (Shingrix) 09/24/2018  4. Prostate cancer screening- past age to based screening requirements requirements. On flomax for BPH. States has  upcoming appointment with Dr. Gilford Rile. Dr. Gilford Rile asked me to do a PSA on his last note so we will update today- trend overall appears somewhat low risk- likely stop at age 53 at latest.  Lab Results  Component Value Date   PSA 3.17 10/16/2019   PSA 1.79 10/10/2018   PSA 1.52 03/01/2018   5. Colon cancer screening -03/07/2018 with 3-year repeat due to polyp history but also ulcerative colitis history  6. Skin cancer screening- Dr. [place] Crane dermatology in [place]. advised regular sunscreen use. Denies worrisome, changing, or new skin lesions other than spot on right ear which recurred after cryo- encouraged follow up 7. Former smoker (teenage years)-AAA scan declined by patient. He seemed to consider it more this year than last year.   Status of chronic or acute concerns   Coronary artery disease involving native coronary artery of native heart without angina pectoris-compliant with atorvastatin 10 mg daily and Plavix 75 mg (also with history of stroke).  Asymptomatic without chest pain or shortness of breath - needs to schedule with Dr. Harrington Challenger for follow up   Hyperlipidemia-compliant with atorvastatin 10 mg.  Update lipid panel today   Essential hypertension-compliant with atenolol 25 mg and irbesartan  hydrochlorothiazide 150-12.5 mg.  - blood pressure looks really good but HR slower than we would like- we will continue 2 tablets of irbesartan-hctz but reduce atenolol 71m to just half tablet so 12.526m  Stage 3 chronic kidney disease- avoids nsaids. Can take tylenol if needed. Update GFR/Cr today.   Hyperglycemia-we will update A1c with labs.  Encouraged healthy eating/regular exercise. He prefers to stay off metformin if possible  Lab Results  Component Value Date   HGBA1C 5.8 10/16/2019   HGBA1C 5.9 10/10/2018   HGBA1C 6.0 09/18/2017   ULCERATIVE COLITIS-LEFT SIDE-follows closely with GI.  On mercaptopurine.   Sleep apnea, unspecified type- not wearing his CPAP- encouraged to use.    Gastroesophageal reflux disease, unspecified whether esophagitis present-reasonable control on Pepcid   Fever blisters- likes to have acyclovir on hand when has issues- refilled today. Prefers oral given all other medicines he has to take.   Recommended follow up: Return in about 6 months (around 04/15/2020) for follow up- or sooner if needed.  Lab/Order associations: fasting   ICD-10-CM   1. Preventative health care  Z00.00 CBC with Differential/Platelet    Comprehensive metabolic panel    Lipid panel    PSA    Hemoglobin A1c  2. Coronary artery disease involving native coronary artery of native heart without angina pectoris  I25.10   3. Hyperlipidemia, unspecified hyperlipidemia type  E78.5 CBC with Differential/Platelet    Comprehensive metabolic panel    Lipid panel  4. Essential hypertension  I10   5. Stage 3 chronic kidney disease, unspecified whether stage 3a or 3b CKD  N18.30   6. Hyperglycemia  R73.9 Hemoglobin A1c  7. ULCERATIVE COLITIS-LEFT SIDE  K51.50   8. Sleep apnea, unspecified type  G47.30   9. History of colonic polyps  Z86.010   10. Former smoker  Z87.891   1134Gastroesophageal reflux disease, unspecified whether esophagitis present  K21.9   12. Screening for prostate cancer   Z12.5 Hemoglobin A1c    Meds ordered this encounter  Medications  . atenolol (TENORMIN) 25 MG tablet    Sig: Take 0.5 tablets (12.5 mg total) by mouth daily.    Dispense:  45 tablet    Refill:  3  . irbesartan-hydrochlorothiazide (AVALIDE) 150-12.5 MG tablet  Sig: Take 2 tablets by mouth daily.    Dispense:  180 tablet    Refill:  3  . fluticasone (FLONASE) 50 MCG/ACT nasal spray    Sig: INHALE 1 SPRAY INTO EACH NOSTRIL ONCE DAILY.    Dispense:  16 g    Refill:  5  . clopidogrel (PLAVIX) 75 MG tablet    Sig: Take 1 tablet (75 mg total) by mouth daily.    Dispense:  90 tablet    Refill:  3  . atorvastatin (LIPITOR) 10 MG tablet    Sig: Take 1 tablet (10 mg total) by mouth daily.    Dispense:  90 tablet    Refill:  3  . acyclovir ointment (ZOVIRAX) 5 %    Sig: Apply topically every 3 (three) hours. For fever blisters    Dispense:  15 g    Refill:  2    Return precautions advised.  Garret Reddish, MD

## 2019-10-23 ENCOUNTER — Telehealth: Payer: Self-pay

## 2019-10-23 NOTE — Telephone Encounter (Signed)
Gwenevere Ghazi Key: [id number] - PA Case ID: [id number] - Rx #: [id number] Need help? Call us at [phone] Status Sent to [facility] Acyclovir 5% ointment Form [facility] [place] [phone] [place] [phone]

## 2019-10-26 LAB — PSA: PSA: 3.17

## 2019-10-28 ENCOUNTER — Telehealth: Payer: Self-pay

## 2019-10-28 NOTE — Telephone Encounter (Signed)
Patient called I have reviewed labs with him. Will work on diet and exercise. He will call if any issues.

## 2019-10-28 NOTE — Telephone Encounter (Signed)
Please tell patient this was denied-would he like for me to send in Valtrex which is an oral medication if he has skin lesions that he previously used acyclovir on?

## 2019-10-28 NOTE — Telephone Encounter (Signed)
Fax received Acyclovir was denied by insurance does not meet medial necessities. Must have a failed trial of acyclovir (oral )

## 2019-10-29 NOTE — Telephone Encounter (Signed)
Patient has picked up he with cover my meds

## 2019-11-04 ENCOUNTER — Other Ambulatory Visit: Payer: Self-pay | Admitting: Gastroenterology

## 2019-12-04 NOTE — Progress Notes (Signed)
Cardiology Office Note   Date:  12/06/2019   ID:  Catherine, Oak Jun 29, 1946, MRN [id number]  PCP:  Marin Olp, MD  Cardiologist:   Dorris Carnes, MD    F/U of CAD     History of Present Illness: [name] is a 74 y.o. male with a history of CAD (MI in 1993)  Underwent PTCA to LAD  Nuclear scan in 2011 normal  Echo 2016 LVEF normal   I saw him in early 2019    Breathing has been OK   No CP    Getting outside and doing some things  Stays actvie   No dizziness  Current Meds  Medication Sig  . acyclovir ointment (ZOVIRAX) 5 % Apply topically every 3 (three) hours. For fever blisters  . atenolol (TENORMIN) 25 MG tablet Take 0.5 tablets (12.5 mg total) by mouth daily.  Marland Kitchen atorvastatin (LIPITOR) 10 MG tablet Take 1 tablet (10 mg total) by mouth daily.  . clopidogrel (PLAVIX) 75 MG tablet Take 1 tablet (75 mg total) by mouth daily.  . famotidine (PEPCID) 20 MG tablet Take 20 mg by mouth daily.  . fluticasone (FLONASE) 50 MCG/ACT nasal spray INHALE 1 SPRAY INTO EACH NOSTRIL ONCE DAILY.  Marland Kitchen irbesartan-hydrochlorothiazide (AVALIDE) 150-12.5 MG tablet Take 2 tablets by mouth daily.  . mercaptopurine (PURINETHOL) 50 MG tablet TAKE 1 AND 1/2 TABLETS BY MOUTH DAILY. GIVE ON AN EMPTY STOMACH 1 HOUR BEFORE OR 2 HOURS AFTER A MEAL.  . nitroGLYCERIN (NITROSTAT) 0.4 MG SL tablet Place 1 tablet (0.4 mg total) under the tongue every 5 (five) minutes as needed for chest pain.  . tamsulosin (FLOMAX) 0.4 MG CAPS capsule Take 0.4 mg by mouth daily.      Allergies:   Penicillins   Past Medical History:  Diagnosis Date  . Adenomatous polyps 03/2004, 11/2012  . Arthritis   . Bradycardia 2018  . Clotting disorder (HCC)    on plavix  . Coronary atherosclerosis of unspecified type of vessel, native or graft   . Esophagitis 1993  . GERD (gastroesophageal reflux disease)   . Hyperlipidemia   . Hypertension   . Myocardial infarction ([place]) 1993  . Shingles 2019  . Sleep apnea     cpap  . Stroke ([place]) 02/2014  . Trigger finger (acquired)   . 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 UTI (urinary tract infection) 2019    Past Surgical History:  Procedure Laterality Date  . ANGIOPLASTY  1993  . CHOLECYSTECTOMY  2002  . LUMBAR FUSION  1983  . ROTATOR CUFF REPAIR  2009   right  . TRIGGER FINGER RELEASE     right     Social History:  The patient  reports that he quit smoking about 53 years ago. His smoking use included cigarettes. He has a 3.00 pack-year smoking history. He has quit using smokeless tobacco.  His smokeless tobacco use included chew. He reports that he does not drink alcohol or use drugs.   Family History:  The patient's family history includes CAD in his brother; COPD in his father; Heart attack in his brother; Heart disease in his sister; Stroke in his mother.    ROS:  Please see the history of present illness. All other systems are reviewed and  Negative to the above problem except as noted.    PHYSICAL EXAM: VS:  BP 110/68   Pulse (!) 59   Ht 5' 8"  (1.727 m)   Wt 203 lb 12.8  oz (92.4 kg)   BMI 30.99 kg/m   BPP:HKFEX 74 yo , in no acute distress  HEENT: normal  Neck: no JVD orcarotid bruits,  Cardiac: RRR; no murmurs, rubs, or gallops,no edema  Respiratory:  clear to auscultation bilaterally, normal work of breathing GI: soft, nontender, nondistended, + BS  No hepatomegaly  MS: no deformity Moving all extremities   Skin: warm and dry, no rash Neuro:  Strength and sensation are intact Psych: euthymic mood, full affect   EKG:  EKG is ordered today. SB 59 bpm   First degree AV block  PR 218 msec  Lipid Panel    Component Value Date/Time   CHOL 116 10/16/2019 0959   TRIG 108.0 10/16/2019 0959   TRIG 88 10/20/2006 0927   HDL 30.20 (L) 10/16/2019 0959   CHOLHDL 4 10/16/2019 0959   VLDL 21.6 10/16/2019 0959   LDLCALC 64 10/16/2019 0959      Wt Readings from Last 3 Encounters:  12/06/19 203 lb 12.8 oz (92.4 kg)   10/16/19 202 lb 3.2 oz (91.7 kg)  08/12/19 199 lb 6 oz (90.4 kg)      ASSESSMENT AND PLAN:  1  CAD  No symptoms to usgg angina  Keep on same regimen  2  HL  LDL is very good at 64  HDL 30     3  HTN  BP is good  Keep on same regimen   4  HCM  Stay active   Try to get covid vaccine   F/U in December 2021    Current medicines are reviewed at length with the patient today.  The patient does not have concerns regarding medicines.  Signed, Dorris Carnes, MD  12/06/2019 11:38 AM    [address] [place], [place],   [address] Phone: [phone]; Fax: [phone]

## 2019-12-06 ENCOUNTER — Other Ambulatory Visit: Payer: Self-pay | Admitting: Gastroenterology

## 2019-12-06 ENCOUNTER — Encounter: Payer: Self-pay | Admitting: Internal Medicine

## 2019-12-06 ENCOUNTER — Ambulatory Visit: Payer: Medicare HMO | Admitting: Internal Medicine

## 2019-12-06 VITALS — BP 110/68 | HR 59 | Ht 68.0 in | Wt 203.8 lb

## 2019-12-06 DIAGNOSIS — I2510 Atherosclerotic heart disease of native coronary artery without angina pectoris: Secondary | ICD-10-CM

## 2019-12-06 NOTE — Patient Instructions (Addendum)
Medication Instructions:  No changes *If you need a refill on your cardiac medications before your next appointment, please call your pharmacy*  Lab Work: None  Testing/Procedures: none  Follow-Up: At [facility], you and your health needs are our priority.  As part of our continuing mission to provide you with exceptional heart care, we have created designated Provider Care Teams.  These Care Teams include your primary Cardiologist (physician) and Advanced Practice Providers (APPs -  Physician Assistants and Nurse Practitioners) who all work together to provide you with the care you need, when you need it.  Your next appointment:   11 month(s)  The format for your next appointment:   Either In Person or Virtual  Provider:   You may see Dorris Carnes, MD or one of the following Advanced Practice Providers on your designated Care Team:    Richardson Dopp, PA-C  [place], [place]  Daune Perch, NP   Other Instructions  We are recommending the COVID-19 vaccine to all of our patients. Cardiac medications (including blood thinners) should not deter anyone from being vaccinated and there is no need to hold any of those medications prior to vaccine administration.  Currently, there is a hotline to call (active 11/22/19) to schedule vaccination appointments as no walk-ins will be accepted.  Number: [phone]  If you have further questions or concerns about the vaccine process, please visit [email or web address] or contact your primary care physician.

## 2020-01-03 DIAGNOSIS — N400 Enlarged prostate without lower urinary tract symptoms: Secondary | ICD-10-CM | POA: Diagnosis not present

## 2020-01-03 DIAGNOSIS — R3121 Asymptomatic microscopic hematuria: Secondary | ICD-10-CM | POA: Diagnosis not present

## 2020-01-06 ENCOUNTER — Other Ambulatory Visit: Payer: Self-pay | Admitting: Gastroenterology

## 2020-02-04 ENCOUNTER — Other Ambulatory Visit: Payer: Self-pay | Admitting: Gastroenterology

## 2020-02-26 ENCOUNTER — Other Ambulatory Visit: Payer: Self-pay | Admitting: Family Medicine

## 2020-03-05 ENCOUNTER — Other Ambulatory Visit: Payer: Self-pay | Admitting: Gastroenterology

## 2020-03-11 ENCOUNTER — Other Ambulatory Visit: Payer: Self-pay | Admitting: Family Medicine

## 2020-03-27 ENCOUNTER — Ambulatory Visit (INDEPENDENT_AMBULATORY_CARE_PROVIDER_SITE_OTHER): Payer: Medicare HMO

## 2020-03-27 ENCOUNTER — Ambulatory Visit (INDEPENDENT_AMBULATORY_CARE_PROVIDER_SITE_OTHER): Payer: Medicare HMO | Admitting: Family Medicine

## 2020-03-27 ENCOUNTER — Other Ambulatory Visit: Payer: Self-pay

## 2020-03-27 ENCOUNTER — Encounter: Payer: Self-pay | Admitting: Family Medicine

## 2020-03-27 VITALS — BP 110/66 | HR 53 | Temp 98.3°F | Ht 68.0 in | Wt 200.2 lb

## 2020-03-27 DIAGNOSIS — K5150 Left sided colitis without complications: Secondary | ICD-10-CM | POA: Diagnosis not present

## 2020-03-27 DIAGNOSIS — M545 Low back pain, unspecified: Secondary | ICD-10-CM

## 2020-03-27 MED ORDER — TRAMADOL HCL 50 MG PO TABS: 50.0000 mg | ORAL_TABLET | Freq: Four times a day (QID) | ORAL | 0 refills | Status: DC | PRN

## 2020-03-27 NOTE — Progress Notes (Signed)
Phone [phone] In person visit     I,Donna Orphanos,acting as a scribe for Garret Reddish, MD.,have documented all relevant documentation on the behalf of Garret Reddish, MD,as directed by  Garret Reddish, MD while in the presence of Garret Reddish, MD.  Subjective:   [name] is a 74 y.o. year old very pleasant male patient who presents for/with See problem oriented charting Chief Complaint  Patient presents with  . Back Pain    This visit occurred during the SARS-CoV-2 public health emergency.  Safety protocols were in place, including screening questions prior to the visit, additional usage of staff PPE, and extensive cleaning of exam room while observing appropriate contact time as indicated for disinfecting solutions.   Past Medical History-  Patient Active Problem List   Diagnosis Date Noted  . ULCERATIVE COLITIS-LEFT SIDE 07/03/2008    Priority: High  . CAD (coronary artery disease) 06/15/2007    Priority: High  . Hyperglycemia 06/08/2015    Priority: Medium  . CKD (chronic kidney disease), stage III 10/24/2014    Priority: Medium  . Sleep apnea 07/02/2008    Priority: Medium  . Hyperlipidemia 06/14/2007    Priority: Medium  . Essential hypertension 06/14/2007    Priority: Medium  . History of colonic polyps 06/14/2007    Priority: Medium  . Postherpetic neuralgia 03/19/2018    Priority: Low  . Balanitis 03/06/2017    Priority: Low  . OA (osteoarthritis) of knee 12/07/2015    Priority: Low  . Former smoker 06/08/2015    Priority: Low  . Edema 02/04/2015    Priority: Low  . Allergic rhinitis 10/24/2014    Priority: Low  . Obesity (BMI 30-39.9) 04/25/2014    Priority: Low  . Right cervical radiculopathy 04/12/2013    Priority: Low  . Low back pain 01/11/2008    Priority: Low  . GERD 11/07/2007    Priority: Low  . Trigger finger, acquired 10/23/2007    Priority: Low  . Actinic keratosis 10/10/2018    Medications- reviewed and  updated Current Outpatient Medications  Medication Sig Dispense Refill  . acyclovir ointment (ZOVIRAX) 5 % Apply topically every 3 (three) hours. For fever blisters 15 g 2  . atenolol (TENORMIN) 25 MG tablet Take 0.5 tablets (12.5 mg total) by mouth daily. 45 tablet 3  . atorvastatin (LIPITOR) 10 MG tablet TAKE 1 TABLET EVERY DAY 90 tablet 3  . clopidogrel (PLAVIX) 75 MG tablet TAKE 1 TABLET EVERY DAY 90 tablet 3  . famotidine (PEPCID) 20 MG tablet Take 20 mg by mouth daily.    . fluticasone (FLONASE) 50 MCG/ACT nasal spray INHALE 1 SPRAY INTO EACH NOSTRIL ONCE DAILY. 16 g 5  . irbesartan-hydrochlorothiazide (AVALIDE) 150-12.5 MG tablet TAKE 2 TABLETS EVERY DAY 180 tablet 3  . mercaptopurine (PURINETHOL) 50 MG tablet TAKE 1 AND 1/2 TABLETS BY MOUTH DAILY. GIVE ON AN EMPTY STOMACH 1 HOUR BEFORE OR 2 HOURS AFTER A MEAL. 45 tablet 0  . nitroGLYCERIN (NITROSTAT) 0.4 MG SL tablet Place 1 tablet (0.4 mg total) under the tongue every 5 (five) minutes as needed for chest pain. 20 tablet 5  . tamsulosin (FLOMAX) 0.4 MG CAPS capsule Take 0.4 mg by mouth daily.      No current facility-administered medications for this visit.     Objective:  BP 110/66 (BP Location: Left Arm, Patient Position: Sitting, Cuff Size: Large)   Pulse (!) 53   Temp 98.3 F (36.8 C) (Temporal)   Ht 5' 8"  (1.727  m)   Wt 200 lb 4 oz (90.8 kg)   SpO2 97%   BMI 30.45 kg/m  Gen: NAD, resting comfortably CV: RRR no murmurs rubs or gallops Lungs: CTAB no crackles, wheeze, rhonchi Ext: no edema Skin: warm, dry Back - Normal skin, Spine with normal alignment and no deformity.  Midline  Tenderness to vertebral process palpation.  Paraspinous muscles are not tender and without spasm.   Range of motion is full at neck and lumbar sacral regions for him- history of back surgery and about foot off the floor for trying to touch toes. Negative Straight leg raise.  Neuro- no saddle anesthesia, 5/5 strength lower extremities      Assessment and Plan   # Back Pain S:Pt c/o back pain at coccyx area x 10 days. Pt said he was lifting 2 heavy toilets and wonders if hepulled something- started hurting the next day.. Denies radiation and no numbness or tingling in legs.  Pain rating 6/10, quality dull aching pain, Location-lumbar spine/low back. Still able to do some work around the house- main issues is with sitting Started, duration-10 days ago Worse with : sitting- hurts worse everytime Relieved by-standing and moving or laying down Previous Treatment- Using Tylenol with little relief and using heating pad to area. Tried lidocaine patch without relief.  Previous imaging- none recently Back surgery 1983- L4 L5 - he is not sure about fusion vs kyphoplasty (he states "packed with something" possibly pig related"  ROS-No saddle anesthesia, bladder incontinence, fecal incontinence, weakness in extremity, numbness or tingling in extremity. History negative for trauma- did do some heavy lifting, history of cancer, fever, chills, unintentional weight loss, recent bacterial infection, recent IV drug use, HIV, pain worse at night or while supine.  A/P: 74 year old male with low back pain acute onset a day after heavy lifting.  He does have midline pain-we are going to rule out a compression fracture with x-ray today. -I want him to try icing this for 20 minutes 3 times a day for the next 3 days then can go back to heat -I advised him to schedule Tylenol arthritis every 8 hours -If pain gets above a 4- 5 out of 10 he is also going to have tramadol available to help with pain.  He knows not to drive for 6 to 8 hours after taking this -If pain is not improving within 10 to 14 days or if symptoms worsen I want him to let me know as I would like to refer him to sports medicine for further evaluation  #Ulcerative colitis- has been well controlled. No blood in stool or abdominal cramping- doubt current back pain related to this. Continue  mercaptopurine.   Recommended follow up: Return for as needed for new, worsening, persistent symptoms. Future Appointments  Date Time Provider [place]  04/16/2020  1:20 PM Marin Olp, MD [facility]   Lab/Order associations:   ICD-10-CM   1. Acute midline low back pain without sciatica  M54.5 DG Lumbar Spine Complete    No orders of the defined types were placed in this encounter.  The entirety of the information documented in the History of Present Illness, Review of Systems and Physical Exam were personally obtained by me. Portions of this information were initially documented by the CMA and reviewed by me for thoroughness and accuracy.   Garret Reddish, MD  Return precautions advised.  Garret Reddish, MD

## 2020-03-27 NOTE — Patient Instructions (Addendum)
-  I want him to try icing this for 20 minutes 3 times a day for the next 3 days then can go back to heat -I advised him to schedule Tylenol arthritis every 8 hours -If pain gets above a 4- 5 out of 10 he is also going to have tramadol available to help with pain.  He knows not to drive for 6 to 8 hours after taking this -If pain is not improving within 10 to 14 days or if symptoms worsen I want him to let me know as I would like to refer him to sports medicine for further evaluation  Please stop by x-ray before you go If you have mychart- we will send your results within 3 business days of [place] receiving them.  If you do not have mychart- we will call you about results within 5 business days of [place] receiving them.   Recommended follow up: Return for as needed for new, worsening, persistent symptoms.

## 2020-04-06 ENCOUNTER — Other Ambulatory Visit: Payer: Self-pay | Admitting: Gastroenterology

## 2020-04-06 ENCOUNTER — Telehealth: Payer: Self-pay | Admitting: Gastroenterology

## 2020-04-06 MED ORDER — MERCAPTOPURINE 50 MG PO TABS: ORAL_TABLET | ORAL | 0 refills | Status: DC

## 2020-04-06 NOTE — Telephone Encounter (Signed)
Prescription sent to patient's pharmacy until scheduled appt.

## 2020-04-07 NOTE — Patient Instructions (Addendum)
There are no preventive care reminders to display for this patient.  Blood pressure Blood pressure look good on medications. If you get a stomach bug take only one Irbesartan-hctz  while you are sick.   Cholesterol   Continue medications.   Pre Diabetes: We are checking your blood sugar today but you have been doing well with controlling with diet and exercise. Good job on loosing 4 more pounds from last visit.    Elevated PSA  We are checking your PSA today. If still elevated we will send to Urology so that they can follow up with you.   Dental pain: would ask your dentist if anything that may help with discomfort. Continue to use Anbesol as needed to help with discomfort until you can follow up with them.

## 2020-04-07 NOTE — Progress Notes (Signed)
Phone [phone] In person visit   Subjective:   [name] is a 74 y.o. year old very pleasant male patient who presents for/with See problem oriented charting Chief Complaint  Patient presents with  . Hyperlipidemia  . Hypertension  . Hyperglycemia  . Gastroesophageal Reflux    This visit occurred during the SARS-CoV-2 public health emergency.  Safety protocols were in place, including screening questions prior to the visit, additional usage of staff PPE, and extensive cleaning of exam room while observing appropriate contact time as indicated for disinfecting solutions.   Past Medical History-  Patient Active Problem List   Diagnosis Date Noted  . ULCERATIVE COLITIS-LEFT SIDE 07/03/2008    Priority: High  . CAD (coronary artery disease) 06/15/2007    Priority: High  . Hyperglycemia 06/08/2015    Priority: Medium  . CKD (chronic kidney disease), stage III 10/24/2014    Priority: Medium  . Sleep apnea 07/02/2008    Priority: Medium  . Hyperlipidemia 06/14/2007    Priority: Medium  . Essential hypertension 06/14/2007    Priority: Medium  . History of colonic polyps 06/14/2007    Priority: Medium  . Postherpetic neuralgia 03/19/2018    Priority: Low  . Balanitis 03/06/2017    Priority: Low  . OA (osteoarthritis) of knee 12/07/2015    Priority: Low  . Former smoker 06/08/2015    Priority: Low  . Edema 02/04/2015    Priority: Low  . Allergic rhinitis 10/24/2014    Priority: Low  . Obesity (BMI 30-39.9) 04/25/2014    Priority: Low  . Right cervical radiculopathy 04/12/2013    Priority: Low  . Low back pain 01/11/2008    Priority: Low  . GERD 11/07/2007    Priority: Low  . Trigger finger, acquired 10/23/2007    Priority: Low  . Actinic keratosis 10/10/2018    Medications- reviewed and updated Current Outpatient Medications  Medication Sig Dispense Refill  . acyclovir ointment (ZOVIRAX) 5 % Apply topically every 3 (three) hours. For fever blisters  15 g 2  . atenolol (TENORMIN) 25 MG tablet Take 0.5 tablets (12.5 mg total) by mouth daily. 45 tablet 3  . atorvastatin (LIPITOR) 10 MG tablet TAKE 1 TABLET EVERY DAY 90 tablet 3  . clopidogrel (PLAVIX) 75 MG tablet TAKE 1 TABLET EVERY DAY 90 tablet 3  . famotidine (PEPCID) 20 MG tablet Take 20 mg by mouth daily.    . fluticasone (FLONASE) 50 MCG/ACT nasal spray INHALE 1 SPRAY INTO EACH NOSTRIL ONCE DAILY. 16 g 5  . irbesartan-hydrochlorothiazide (AVALIDE) 150-12.5 MG tablet TAKE 2 TABLETS EVERY DAY 180 tablet 3  . mercaptopurine (PURINETHOL) 50 MG tablet Give on an empty stomach 1 hour before or 2 hours after meals. Caution: Chemotherapy. 45 tablet 0  . nitroGLYCERIN (NITROSTAT) 0.4 MG SL tablet Place 1 tablet (0.4 mg total) under the tongue every 5 (five) minutes as needed for chest pain. 20 tablet 5  . tamsulosin (FLOMAX) 0.4 MG CAPS capsule Take 0.4 mg by mouth daily.      No current facility-administered medications for this visit.     Objective:  BP 110/62   Pulse 62   Temp 98.7 F (37.1 C) (Temporal)   Ht 5' 8"  (1.727 m)   Wt 199 lb (90.3 kg)   SpO2 96%   BMI 30.26 kg/m  Gen: NAD, resting comfortably CV: RRR no murmurs rubs or gallops Lungs: CTAB no crackles, wheeze, rhonchi Abdomen: soft/nontender/nondistended/normal bowel sounds. No rebound or guarding.  Ext: no  edema Skin: warm, dry Neuro: grossly normal, moves all extremities  h    Assessment and Plan   #Low back pain-patient seen Mar 27, 2020 after low back pain started after heavy lifting-had midline pain so we did x-rays but no fracture or mass found.  Recommended scheduling Tylenol arthritis as well as icing area and I gave him tramadol for backup for more severe pain.  Plan has been sports medicine referral if fails to improve within 2 weeks-he states back to his baseline- very encouraged   #Elevated PSA-PSA up over 3 at last visit from 1.79 in 2019-patient follows with Dr. Gilford Rile- at time of visit in February  I did not see PSA specifically mentioned other than "psa with Dr. Yong Channel". If psa still trending up will send specific referral under elevated PSA- we only forwarded message last visit  Lab Results  Component Value Date   PSA 3.17 10/26/2019   PSA 3.17 10/16/2019   PSA 1.79 10/10/2018   # Hyperglycemia/insulin resistance/prediabetes S:  Medication:  Not currently on any medications  Exercise and diet-  no exercise at this time outside of working- active with work.  Down 3 pounds from December visit Lab Results  Component Value Date   HGBA1C 5.8 10/16/2019   HGBA1C 5.9 10/10/2018   HGBA1C 6.0 09/18/2017   A/P: Continued improvement in A1c on last several checks-patient continues to work on healthy eating/regular exercise and is down 3 pounds from last visit-update another A1c with labs today   #hyperlipidemia/CAD S: Medication:Atorvastatin 10Mg, Plavix 75 mg  No chest pain or shortness of breath. Will refill nitro thoguh not using Lab Results  Component Value Date   CHOL 116 10/16/2019   HDL 30.20 (L) 10/16/2019   LDLCALC 64 10/16/2019   TRIG 108.0 10/16/2019   CHOLHDL 4 10/16/2019   A/P: Stable HLD and CAD. Continue current medications.   #hypertension S: medication: Atenolol 25Mg, Irbesartan-HCTZ 150-12.5MG- takes 2 tablets Home readings #s:  Was checking at home after medication changes but has not been recently.  BP Readings from Last 3 Encounters:  04/16/20 110/62  03/27/20 110/66  12/06/19 110/68  A/P: Stable. Continue current medications.   % Ulcerative colitis-continues close follow-up with GI-sees Dr. Fuller Plan in July  Recommended follow up: 6 months physical Future Appointments  Date Time Provider [place]  06/02/2020  3:00 PM Ladene Artist, MD [facility]  10/19/2020 11:00 AM Marin Olp, MD [facility]    Lab/Order associations:   ICD-10-CM   1. Essential hypertension  I10 CBC with Differential/Platelet    Comprehensive metabolic  panel  2. Hyperlipidemia, unspecified hyperlipidemia type  E78.5   3. Coronary artery disease involving native coronary artery of native heart without angina pectoris  I25.10   4. Hyperglycemia  R73.9 Hemoglobin A1c  5. Elevated PSA  R97.20 PSA    Meds ordered this encounter  Medications  . nitroGLYCERIN (NITROSTAT) 0.4 MG SL tablet    Sig: Place 1 tablet (0.4 mg total) under the tongue every 5 (five) minutes as needed for chest pain.    Dispense:  20 tablet    Refill:  5   Return precautions advised.  Garret Reddish, MD

## 2020-04-16 ENCOUNTER — Telehealth: Payer: Self-pay | Admitting: Family Medicine

## 2020-04-16 ENCOUNTER — Other Ambulatory Visit: Payer: Self-pay

## 2020-04-16 ENCOUNTER — Ambulatory Visit (INDEPENDENT_AMBULATORY_CARE_PROVIDER_SITE_OTHER): Payer: Medicare HMO | Admitting: Family Medicine

## 2020-04-16 ENCOUNTER — Encounter: Payer: Self-pay | Admitting: Family Medicine

## 2020-04-16 VITALS — BP 110/62 | HR 62 | Temp 98.7°F | Ht 68.0 in | Wt 199.0 lb

## 2020-04-16 DIAGNOSIS — R972 Elevated prostate specific antigen [PSA]: Secondary | ICD-10-CM | POA: Diagnosis not present

## 2020-04-16 DIAGNOSIS — I2510 Atherosclerotic heart disease of native coronary artery without angina pectoris: Secondary | ICD-10-CM

## 2020-04-16 DIAGNOSIS — E785 Hyperlipidemia, unspecified: Secondary | ICD-10-CM | POA: Diagnosis not present

## 2020-04-16 DIAGNOSIS — I10 Essential (primary) hypertension: Secondary | ICD-10-CM | POA: Diagnosis not present

## 2020-04-16 DIAGNOSIS — R739 Hyperglycemia, unspecified: Secondary | ICD-10-CM

## 2020-04-16 LAB — COMPREHENSIVE METABOLIC PANEL
ALT: 26 U/L (ref 0–53)
AST: 20 U/L (ref 0–37)
Albumin: 3.9 g/dL (ref 3.5–5.2)
Alkaline Phosphatase: 57 U/L (ref 39–117)
BUN: 12 mg/dL (ref 6–23)
CO2: 31 mEq/L (ref 19–32)
Calcium: 9.3 mg/dL (ref 8.4–10.5)
Chloride: 98 mEq/L (ref 96–112)
Creatinine, Ser: 1.31 mg/dL (ref 0.40–1.50)
GFR: 53.64 mL/min — ABNORMAL LOW (ref >60.00)
Glucose, Bld: 96 mg/dL (ref 70–99)
Potassium: 3.4 mEq/L — ABNORMAL LOW (ref 3.5–5.1)
Sodium: 136 mEq/L (ref 135–145)
Total Bilirubin: 0.7 mg/dL (ref 0.2–1.2)
Total Protein: 6.7 g/dL (ref 6.0–8.3)

## 2020-04-16 LAB — CBC WITH DIFFERENTIAL/PLATELET
Basophils Absolute: 0.1 10*3/uL (ref 0.0–0.1)
Basophils Relative: 0.8 % (ref 0.0–3.0)
Eosinophils Absolute: 0.3 10*3/uL (ref 0.0–0.7)
Eosinophils Relative: 4.4 % (ref 0.0–5.0)
HCT: 44.5 % (ref 39.0–52.0)
Hemoglobin: 15.4 g/dL (ref 13.0–17.0)
Lymphocytes Relative: 30.4 % (ref 12.0–46.0)
Lymphs Abs: 2.2 10*3/uL (ref 0.7–4.0)
MCHC: 34.5 g/dL (ref 30.0–36.0)
MCV: 89.3 fl (ref 78.0–100.0)
Monocytes Absolute: 0.8 10*3/uL (ref 0.1–1.0)
Monocytes Relative: 10.9 % (ref 3.0–12.0)
Neutro Abs: 3.8 10*3/uL (ref 1.4–7.7)
Neutrophils Relative %: 53.5 % (ref 43.0–77.0)
Platelets: 214 10*3/uL (ref 150.0–400.0)
RBC: 4.99 Mil/uL (ref 4.22–5.81)
RDW: 16.4 % — ABNORMAL HIGH (ref 11.5–15.5)
WBC: 7.1 10*3/uL (ref 4.0–10.5)

## 2020-04-16 LAB — HEMOGLOBIN A1C: Hgb A1c MFr Bld: 5.9 % (ref 4.6–6.5)

## 2020-04-16 LAB — PSA: PSA: 1.58 ng/mL (ref 0.10–4.00)

## 2020-04-16 MED ORDER — NITROGLYCERIN 0.4 MG SL SUBL: 0.4000 mg | SUBLINGUAL_TABLET | SUBLINGUAL | 5 refills | Status: DC | PRN

## 2020-04-16 NOTE — Telephone Encounter (Signed)
Patient is requesting that the nitroGLYCERIN (NITROSTAT) 0.4 MG SL tablet go to [place], [place]

## 2020-04-16 NOTE — Telephone Encounter (Signed)
Medication sent.

## 2020-05-07 ENCOUNTER — Other Ambulatory Visit: Payer: Self-pay | Admitting: Gastroenterology

## 2020-06-02 ENCOUNTER — Encounter: Payer: Self-pay | Admitting: Gastroenterology

## 2020-06-02 ENCOUNTER — Ambulatory Visit: Payer: Medicare HMO | Admitting: Gastroenterology

## 2020-06-02 VITALS — BP 110/66 | HR 68 | Ht 68.0 in | Wt 198.0 lb

## 2020-06-02 DIAGNOSIS — K5150 Left sided colitis without complications: Secondary | ICD-10-CM

## 2020-06-02 MED ORDER — MERCAPTOPURINE 50 MG PO TABS: ORAL_TABLET | ORAL | 5 refills | Status: DC

## 2020-06-02 NOTE — Patient Instructions (Signed)
We have sent the following medications to your pharmacy for you to pick up at your convenience: mercaptopurine.   Thank you for choosing me and [facility].  Pricilla Riffle. Dagoberto Ligas., MD., [place]

## 2020-06-02 NOTE — Progress Notes (Signed)
    History of Present Illness: This is a 74 year old male with left-sided ulcerative colitis.  He reports normal bowel movements without bleeding.  His colitis has been under very good control.  He states his reflux symptoms are well controlled on his current regimen.  CBC and CMP from June reviewed.  Current Medications, Allergies, Past Medical History, Past Surgical History, Family History and Social History were reviewed in [employer] record.   Physical Exam: General: Well developed, well nourished, no acute distress Head: Normocephalic and atraumatic Eyes:  sclerae anicteric, EOMI Ears: Normal auditory acuity Mouth: Not examined, mask on during Covid-19 pandemic Lungs: Clear throughout to auscultation Heart: Regular rate and rhythm; no murmurs, rubs or bruits Abdomen: Soft, non tender and non distended. No masses, hepatosplenomegaly or hernias noted. Normal Bowel sounds Rectal: Not done Musculoskeletal: Symmetrical with no gross deformities  Pulses:  Normal pulses noted Extremities: No clubbing, cyanosis, edema or deformities noted Neurological: Alert oriented x 4, grossly nonfocal Psychological:  Alert and cooperative. Normal mood and affect   Assessment and Recommendations:  1. Left sided ulcerative colitis, symptoms well controlled.  He wanted to discuss discontinuing 6-MP for ulcerative colitis since he is asymptomatic.  I advised him to remain on 6MP and we will reassess at his next colonoscopy.  Colonoscopy in April 2019 had no endoscopic evidence of colitis and biopsies were normal.  If no evidence of colitis on his next colonoscopy in 2022, both endoscopically and biopsies, will reconsider discontinuing therapy.  A 3 year surveillance colonoscopy is recommended in April 2022. Continue 6MP 75 mg qd. REV in 6 months.   2. Personal history of adenomatous colon polyps.  Surveillance colonoscopy as above.  3. GERD.  Follow antireflux measures and continue  famotidine 20 mg daily.

## 2020-07-16 ENCOUNTER — Telehealth: Payer: Self-pay | Admitting: Family Medicine

## 2020-07-16 NOTE — Telephone Encounter (Signed)
Left message for patient to call back and schedule Medicare Annual Wellness Visit (AWV) either virtually/audio only OR in office. Whatever the patients preference is.  Last AWV 07/03/19; please schedule at anytime with [facility] at [facility].  This should be a 45 minute visit.

## 2020-07-28 ENCOUNTER — Ambulatory Visit (INDEPENDENT_AMBULATORY_CARE_PROVIDER_SITE_OTHER): Payer: Medicare HMO

## 2020-07-28 ENCOUNTER — Encounter: Payer: Self-pay | Admitting: Podiatry

## 2020-07-28 ENCOUNTER — Other Ambulatory Visit: Payer: Self-pay

## 2020-07-28 ENCOUNTER — Ambulatory Visit: Payer: Medicare HMO | Admitting: Podiatry

## 2020-07-28 DIAGNOSIS — M722 Plantar fascial fibromatosis: Secondary | ICD-10-CM

## 2020-07-28 NOTE — Patient Instructions (Signed)

## 2020-07-29 NOTE — Progress Notes (Signed)
Subjective:  Patient ID: [name], male    DOB: January 05, 1946,  MRN: [id number] HPI Chief Complaint  Patient presents with  . Foot Pain    Plantar heel right - aching x 2 weeks, AM pain, no injury, tried ice and Ibuprofen-no help  . New Patient (Initial Visit)    74 y.o. male presents with the above complaint.   ROS: Denies fever chills nausea vomiting muscle aches pains calf pain back pain chest pain shortness of breath.  Past Medical History:  Diagnosis Date  . Adenomatous polyps 03/2004, 11/2012  . Arthritis   . Bradycardia 2018  . Clotting disorder (HCC)    on plavix  . Coronary atherosclerosis of unspecified type of vessel, native or graft   . Esophagitis 1993  . GERD (gastroesophageal reflux disease)   . Hyperlipidemia   . Hypertension   . Myocardial infarction ([place]) 1993  . Shingles 2019  . Sleep apnea    cpap  . Stroke ([place]) 02/2014  . Trigger finger (acquired)   . 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 UTI (urinary tract infection) 2019   Past Surgical History:  Procedure Laterality Date  . ANGIOPLASTY  1993  . CHOLECYSTECTOMY  2002  . LUMBAR FUSION  1983  . ROTATOR CUFF REPAIR  2009   right  . TRIGGER FINGER RELEASE     right    Current Outpatient Medications:  .  acyclovir ointment (ZOVIRAX) 5 %, Apply topically every 3 (three) hours. For fever blisters, Disp: 15 g, Rfl: 2 .  atenolol (TENORMIN) 25 MG tablet, Take 0.5 tablets (12.5 mg total) by mouth daily., Disp: 45 tablet, Rfl: 3 .  atorvastatin (LIPITOR) 10 MG tablet, TAKE 1 TABLET EVERY DAY, Disp: 90 tablet, Rfl: 3 .  clopidogrel (PLAVIX) 75 MG tablet, TAKE 1 TABLET EVERY DAY, Disp: 90 tablet, Rfl: 3 .  famotidine (PEPCID) 20 MG tablet, Take 20 mg by mouth daily., Disp: , Rfl:  .  fluticasone (FLONASE) 50 MCG/ACT nasal spray, INHALE 1 SPRAY INTO EACH NOSTRIL ONCE DAILY., Disp: 16 g, Rfl: 5 .  irbesartan-hydrochlorothiazide (AVALIDE) 150-12.5 MG tablet, TAKE 2 TABLETS EVERY DAY,  Disp: 180 tablet, Rfl: 3 .  mercaptopurine (PURINETHOL) 50 MG tablet, TAKE 1 AND 1/2 TABLETS BY MOUTH DAILY. GIVE ON AN EMPTY STOMACH 1 HOUR BEFORE OR 2 HOURS AFTER A MEAL., Disp: 45 tablet, Rfl: 5 .  nitroGLYCERIN (NITROSTAT) 0.4 MG SL tablet, Place 1 tablet (0.4 mg total) under the tongue every 5 (five) minutes as needed for chest pain., Disp: 20 tablet, Rfl: 5 .  tamsulosin (FLOMAX) 0.4 MG CAPS capsule, Take 0.4 mg by mouth daily. , Disp: , Rfl:   Allergies  Allergen Reactions  . Penicillins Rash    REACTION: rash   Review of Systems Objective:  There were no vitals filed for this visit.  General: Well developed, nourished, in no acute distress, alert and oriented x3   Dermatological: Skin is warm, dry and supple bilateral. Nails x 10 are well maintained; remaining integument appears unremarkable at this time. There are no open sores, no preulcerative lesions, no rash or signs of infection present.  Vascular: Dorsalis Pedis artery and Posterior Tibial artery pedal pulses are 2/4 bilateral with immedate capillary fill time. Pedal hair growth present. No varicosities and no lower extremity edema present bilateral.   Neruologic: Grossly intact via light touch bilateral. Vibratory intact via tuning fork bilateral. Protective threshold with Semmes Wienstein monofilament intact to all pedal sites bilateral. Patellar and Achilles  deep tendon reflexes 2+ bilateral. No Babinski or clonus noted bilateral.   Musculoskeletal: No gross boney pedal deformities bilateral. No pain, crepitus, or limitation noted with foot and ankle range of motion bilateral. Muscular strength 5/5 in all groups tested bilateral.  Pain on palpation medial calcaneal tubercle of the right heel.  No pain on medial lateral compression of the calcaneus.  Gait: Unassisted, Nonantalgic.    Radiographs:  Radiographs taken today demonstrate an osseously mature individual soft tissue increase in density plantar fashion calcaneal  insertion site.  No fractures of the calcaneal tubercles.  Assessment & Plan:   Assessment: Plantar fasciitis right.  Plan: I injected the area today 20 mg Kenalog 5 mg Marcaine point of maximal tenderness.  Also placed him into plantar fascial brace.  Discussed appropriate shoe gear stretching exercise ice therapy sugar modifications he is currently on blood thinner so we did not start him on any medication.  I will follow-up with him in 1 month we did discuss appropriate shoe gear stretching exercise ice therapy and shoe gear modifications.     Eulamae Greenstein T. [place], [place]

## 2020-07-30 ENCOUNTER — Ambulatory Visit (INDEPENDENT_AMBULATORY_CARE_PROVIDER_SITE_OTHER): Payer: Medicare HMO

## 2020-07-30 ENCOUNTER — Other Ambulatory Visit: Payer: Self-pay

## 2020-07-30 VITALS — Wt 198.0 lb

## 2020-07-30 DIAGNOSIS — Z0000 Encounter for general adult medical examination without abnormal findings: Secondary | ICD-10-CM

## 2020-07-30 NOTE — Patient Instructions (Addendum)
[name] , Thank you for taking time to come for your Medicare Wellness Visit. I appreciate your ongoing commitment to your health goals. Please review the following plan we discussed and let me know if I can assist you in the future.   Screening recommendations/referrals: Colonoscopy: Done 03/07/18 Recommended yearly ophthalmology/optometry visit for glaucoma screening and checkup Recommended yearly dental visit for hygiene and checkup  Vaccinations: Influenza vaccine: Up to date Pneumococcal vaccine: Up to date Tdap vaccine: Up to date Shingles vaccine: Shingrix discussed. Please contact your pharmacy for coverage information.    Covid-19: Completed 2/12 & 01/24/20  Advanced directives: Please bring a copy of your health care power of attorney and living will to the office at your convenience.  Conditions/risks identified: Stay healthy  Next appointment: Follow up in one year for your annual wellness visit.   Preventive Care 52 Years and Older, Male Preventive care refers to lifestyle choices and visits with your health care provider that can promote health and wellness. What does preventive care include?  A yearly physical exam. This is also called an annual well check.  Dental exams once or twice a year.  Routine eye exams. Ask your health care provider how often you should have your eyes checked.  Personal lifestyle choices, including:  Daily care of your teeth and gums.  Regular physical activity.  Eating a healthy diet.  Avoiding tobacco and drug use.  Limiting alcohol use.  Practicing safe sex.  Taking low doses of aspirin every day.  Taking vitamin and mineral supplements as recommended by your health care provider. What happens during an annual well check? The services and screenings done by your health care provider during your annual well check will depend on your age, overall health, lifestyle risk factors, and family history of disease. Counseling  Your  health care provider may ask you questions about your:  Alcohol use.  Tobacco use.  Drug use.  Emotional well-being.  Home and relationship well-being.  Sexual activity.  Eating habits.  History of falls.  Memory and ability to understand (cognition).  Work and work Statistician. Screening  You may have the following tests or measurements:  Height, weight, and BMI.  Blood pressure.  Lipid and cholesterol levels. These may be checked every 5 years, or more frequently if you are over 79 years old.  Skin check.  Lung cancer screening. You may have this screening every year starting at age 9 if you have a 30-pack-year history of smoking and currently smoke or have quit within the past 15 years.  Fecal occult blood test (FOBT) of the stool. You may have this test every year starting at age 45.  Flexible sigmoidoscopy or colonoscopy. You may have a sigmoidoscopy every 5 years or a colonoscopy every 10 years starting at age 56.  Prostate cancer screening. Recommendations will vary depending on your family history and other risks.  Hepatitis C blood test.  Hepatitis B blood test.  Sexually transmitted disease (STD) testing.  Diabetes screening. This is done by checking your blood sugar (glucose) after you have not eaten for a while (fasting). You may have this done every 1-3 years.  Abdominal aortic aneurysm (AAA) screening. You may need this if you are a current or former smoker.  Osteoporosis. You may be screened starting at age 58 if you are at high risk. Talk with your health care provider about your test results, treatment options, and if necessary, the need for more tests. Vaccines  Your health care  provider may recommend certain vaccines, such as:  Influenza vaccine. This is recommended every year.  Tetanus, diphtheria, and acellular pertussis (Tdap, Td) vaccine. You may need a Td booster every 10 years.  Zoster vaccine. You may need this after age  8.  Pneumococcal 13-valent conjugate (PCV13) vaccine. One dose is recommended after age 63.  Pneumococcal polysaccharide (PPSV23) vaccine. One dose is recommended after age 31. Talk to your health care provider about which screenings and vaccines you need and how often you need them. This information is not intended to replace advice given to you by your health care provider. Make sure you discuss any questions you have with your health care provider. Document Released: 11/27/2015 Document Revised: 07/20/2016 Document Reviewed: 09/01/2015 Elsevier Interactive Patient Education  2017 [place] Prevention in the Home Falls can cause injuries. They can happen to people of all ages. There are many things you can do to make your home safe and to help prevent falls. What can I do on the outside of my home?  Regularly fix the edges of walkways and driveways and fix any cracks.  Remove anything that might make you trip as you walk through a door, such as a raised step or threshold.  Trim any bushes or trees on the path to your home.  Use bright outdoor lighting.  Clear any walking paths of anything that might make someone trip, such as rocks or tools.  Regularly check to see if handrails are loose or broken. Make sure that both sides of any steps have handrails.  Any raised decks and porches should have guardrails on the edges.  Have any leaves, snow, or ice cleared regularly.  Use sand or salt on walking paths during winter.  Clean up any spills in your garage right away. This includes oil or grease spills. What can I do in the bathroom?  Use night lights.  Install grab bars by the toilet and in the tub and shower. Do not use towel bars as grab bars.  Use non-skid mats or decals in the tub or shower.  If you need to sit down in the shower, use a plastic, non-slip stool.  Keep the floor dry. Clean up any water that spills on the floor as soon as it happens.  Remove  soap buildup in the tub or shower regularly.  Attach bath mats securely with double-sided non-slip rug tape.  Do not have throw rugs and other things on the floor that can make you trip. What can I do in the bedroom?  Use night lights.  Make sure that you have a light by your bed that is easy to reach.  Do not use any sheets or blankets that are too big for your bed. They should not hang down onto the floor.  Have a firm chair that has side arms. You can use this for support while you get dressed.  Do not have throw rugs and other things on the floor that can make you trip. What can I do in the kitchen?  Clean up any spills right away.  Avoid walking on wet floors.  Keep items that you use a lot in easy-to-reach places.  If you need to reach something above you, use a strong step stool that has a grab bar.  Keep electrical cords out of the way.  Do not use floor polish or wax that makes floors slippery. If you must use wax, use non-skid floor wax.  Do not have throw  rugs and other things on the floor that can make you trip. What can I do with my stairs?  Do not leave any items on the stairs.  Make sure that there are handrails on both sides of the stairs and use them. Fix handrails that are broken or loose. Make sure that handrails are as long as the stairways.  Check any carpeting to make sure that it is firmly attached to the stairs. Fix any carpet that is loose or worn.  Avoid having throw rugs at the top or bottom of the stairs. If you do have throw rugs, attach them to the floor with carpet tape.  Make sure that you have a light switch at the top of the stairs and the bottom of the stairs. If you do not have them, ask someone to add them for you. What else can I do to help prevent falls?  Wear shoes that:  Do not have high heels.  Have rubber bottoms.  Are comfortable and fit you well.  Are closed at the toe. Do not wear sandals.  If you use a  stepladder:  Make sure that it is fully opened. Do not climb a closed stepladder.  Make sure that both sides of the stepladder are locked into place.  Ask someone to hold it for you, if possible.  Clearly mark and make sure that you can see:  Any grab bars or handrails.  First and last steps.  Where the edge of each step is.  Use tools that help you move around (mobility aids) if they are needed. These include:  Canes.  Walkers.  Scooters.  Crutches.  Turn on the lights when you go into a dark area. Replace any light bulbs as soon as they burn out.  Set up your furniture so you have a clear path. Avoid moving your furniture around.  If any of your floors are uneven, fix them.  If there are any pets around you, be aware of where they are.  Review your medicines with your doctor. Some medicines can make you feel dizzy. This can increase your chance of falling. Ask your doctor what other things that you can do to help prevent falls. This information is not intended to replace advice given to you by your health care provider. Make sure you discuss any questions you have with your health care provider. Document Released: 08/27/2009 Document Revised: 04/07/2016 Document Reviewed: 12/05/2014 Elsevier Interactive Patient Education  2017 [employer].

## 2020-07-30 NOTE — Progress Notes (Signed)
O Location: Patient: Home Provider: Office   I discussed the limitations, risks, security and privacy concerns of performing an evaluation and management service by telephone and the availability of in person appointments. The patient expressed understanding and agreed to proceed.  Unable to perform video visit due to video visit attempted and failed and/or patient does not have video capability.   Some vital signs may be absent or patient reported.   Willette Brace, LPN    Subjective:   [name] is a 74 y.o. male who presents for Medicare Annual/Subsequent preventive examination.  Review of Systems     Cardiac Risk Factors include: dyslipidemia;hypertension;male gender;obesity (BMI >30kg/m2)     Objective:    Today's Vitals   07/30/20 1426  Weight: 198 lb (89.8 kg)  PainSc: 9    Body mass index is 30.11 kg/m.  Advanced Directives 07/30/2020 07/03/2019 03/07/2018 03/01/2018 10/18/2016 01/15/2015  Does Patient Have a Medical Advance Directive? No No No No No No  Would patient like information on creating a medical advance directive? - Yes (MAU/Ambulatory/Procedural Areas - Information given) No - Patient declined - - No - patient declined information    Current Medications (verified) Outpatient Encounter Medications as of 07/30/2020  Medication Sig  . acyclovir ointment (ZOVIRAX) 5 % Apply topically every 3 (three) hours. For fever blisters  . atenolol (TENORMIN) 25 MG tablet Take 0.5 tablets (12.5 mg total) by mouth daily.  Marland Kitchen atorvastatin (LIPITOR) 10 MG tablet TAKE 1 TABLET EVERY DAY  . clopidogrel (PLAVIX) 75 MG tablet TAKE 1 TABLET EVERY DAY  . famotidine (PEPCID) 20 MG tablet Take 20 mg by mouth daily.  . fluticasone (FLONASE) 50 MCG/ACT nasal spray INHALE 1 SPRAY INTO EACH NOSTRIL ONCE DAILY.  Marland Kitchen irbesartan-hydrochlorothiazide (AVALIDE) 150-12.5 MG tablet TAKE 2 TABLETS EVERY DAY  . mercaptopurine (PURINETHOL) 50 MG tablet TAKE 1 AND 1/2 TABLETS BY MOUTH DAILY.  GIVE ON AN EMPTY STOMACH 1 HOUR BEFORE OR 2 HOURS AFTER A MEAL.  . tamsulosin (FLOMAX) 0.4 MG CAPS capsule Take 0.4 mg by mouth daily.   . nitroGLYCERIN (NITROSTAT) 0.4 MG SL tablet Place 1 tablet (0.4 mg total) under the tongue every 5 (five) minutes as needed for chest pain. (Patient not taking: Reported on 07/30/2020)   No facility-administered encounter medications on file as of 07/30/2020.    Allergies (verified) Penicillins   History: Past Medical History:  Diagnosis Date  . Adenomatous polyps 03/2004, 11/2012  . Arthritis   . Bradycardia 2018  . Clotting disorder (HCC)    on plavix  . Coronary atherosclerosis of unspecified type of vessel, native or graft   . Esophagitis 1993  . GERD (gastroesophageal reflux disease)   . Hyperlipidemia   . Hypertension   . Myocardial infarction ([place]) 1993  . Shingles 2019  . Sleep apnea    cpap  . Stroke ([place]) 02/2014  . Trigger finger (acquired)   . 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 UTI (urinary tract infection) 2019   Past Surgical History:  Procedure Laterality Date  . ANGIOPLASTY  1993  . CHOLECYSTECTOMY  2002  . LUMBAR FUSION  1983  . ROTATOR CUFF REPAIR  2009   right  . TRIGGER FINGER RELEASE     right   Family History  Problem Relation Age of Onset  . Stroke Mother   . COPD Father   . Heart disease Sister        valve replacement  . CAD Brother   . Heart  attack Brother   . Colon cancer Neg Hx   . Esophageal cancer Neg Hx   . Liver disease Neg Hx   . Diabetes Neg Hx   . Rectal cancer Neg Hx    Social History   Socioeconomic History  . Marital status: Widowed    Spouse name: Not on file  . Number of children: 3  . Years of education: Not on file  . Highest education level: Not on file  Occupational History  . Occupation: apartment maintenance    Employer: [employer]    Comment: retired  Tobacco Use  . Smoking status: Former Smoker    Packs/day: 1.00    Years: 3.00     Pack years: 3.00    Types: Cigarettes    Quit date: 11/14/1966    Years since quitting: 53.7  . Smokeless tobacco: Former Systems developer    Types: Chew  . Tobacco comment: quit 50 years ago  Vaping Use  . Vaping Use: Never used  Substance and Sexual Activity  . Alcohol use: No    Alcohol/week: 0.0 standard drinks  . Drug use: No  . Sexual activity: Not on file  Other Topics Concern  . Not on file  Social History Narrative   Widower January 2019.. 3 children- son is a patient here. 4 grandkids.       Retired from Dealer      Hobbies: work on an old truck- expensive to work on so a little at a time, mow the kids yards, enjoys yardwork      Pt does not get regular exercise--daily caffeine use-5 cups daily   Social Determinants of Radio broadcast assistant Strain: Low Risk   . Difficulty of Paying Living Expenses: Not hard at all  Food Insecurity: No Food Insecurity  . Worried About Charity fundraiser in the Last Year: Never true  . Ran Out of Food in the Last Year: Never true  Transportation Needs: No Transportation Needs  . Lack of Transportation (Medical): No  . Lack of Transportation (Non-Medical): No  Physical Activity: Inactive  . Days of Exercise per Week: 0 days  . Minutes of Exercise per Session: 0 min  Stress: No Stress Concern Present  . Feeling of Stress : Not at all  Social Connections: Moderately Isolated  . Frequency of Communication with Friends and Family: More than three times a week  . Frequency of Social Gatherings with Friends and Family: Twice a week  . Attends Religious Services: More than 4 times per year  . Active Member of Clubs or Organizations: No  . Attends Archivist Meetings: Never  . Marital Status: Widowed    Tobacco Counseling Counseling given: Not Answered Comment: quit 50 years ago   Clinical Intake:  Pre-visit preparation completed: Yes  Pain : 0-10 (heel pain when walk or stand) Pain Score: 9  Pain  Type: Chronic pain Pain Location: Heel (right) Pain Orientation: Right Pain Descriptors / Indicators: Shooting, Pressure (when apply pressure) Pain Onset: 1 to 4 weeks ago Pain Frequency: Intermittent     BMI - recorded: 30.11 Nutritional Status: BMI > 30  Obese Nutritional Risks: None Diabetes: No  How often do you need to have someone help you when you read instructions, pamphlets, or other written materials from your doctor or pharmacy?: 1 - Never  Diabetic?No  Activities of Daily Living In your present state of health, do you have any difficulty performing the following activities: 07/30/2020  Hearing? N  Vision? N  Difficulty concentrating or making decisions? N  Walking or climbing stairs? N  Dressing or bathing? N  Doing errands, shopping? N  Preparing Food and eating ? N  Using the Toilet? N  In the past six months, have you accidently leaked urine? N  Do you have problems with loss of bowel control? N  Managing your Medications? N  Managing your Finances? N  Housekeeping or managing your Housekeeping? N  Some recent data might be hidden    Patient Care Team: Marin Olp, MD as PCP - General (Family Medicine) Fay Records, MD as PCP - Cardiology (Cardiology)  Indicate any recent Medical Services you may have received from other than [facility] providers in the past year (date may be approximate).     Assessment:   This is a routine wellness examination for Rj.  Hearing/Vision screen  Hearing Screening   125Hz  250Hz  500Hz  1000Hz  2000Hz  3000Hz  4000Hz  6000Hz  8000Hz   Right ear:           Left ear:           Comments: Pt denies any hearing difficulty at this time  Vision Screening Comments: Pt follows up with Sr Frederico Hamman next month  Dietary issues and exercise activities discussed: Current Exercise Habits: The patient does not participate in regular exercise at present  Goals    . Blood Pressure < 140/90    . Exercise 150 minutes per week (moderate  activity)     Will try to get back on the silver sneaker program and occasionally walk on the treadmill once double vision is cleared   Older adults aged 84 or older, who are generally fit and have no health conditions that limit their mobility, should try to be active daily and should do: at least 150 minutes of moderate aerobic activity such as cycling or walking every week, and  strength exercises on two or more days a week that work all the major muscles (legs, hips, back, abdomen, chest, shoulders and arms).      OR  75 minutes of vigorous aerobic activity such as running or a game of singles tennis every week, and  strength exercises on two or more days a week that work all the major muscles (legs, hips, back, abdomen, chest, shoulders and arms).     OR  a mix of moderate and vigorous aerobic activity every week. For example, two 30-minute runs, plus 30 minutes of fast walking, equates to 150 minutes of moderate aerobic activity, and  strength exercises on two or more days a week that work all the major muscles (legs, hips, back, abdomen, chest, shoulders and arms).  A rule of thumb is that one minute of vigorous activity provides the same health benefits as two minutes of moderate activity. You should also try to break up long periods of sitting with light activity, as sedentary behaviour is now considered an independent risk factor for ill health, no matter how much exercise you do. Find out why sitting is bad for your health. Older adults at risk of falls, such as people with weak legs, poor balance and some medical conditions, should do exercises to improve balance and co-ordination on at least two days a week. Examples include yoga, tai chi and dancing.      . Patient Stated     Stay healthy    . Weight (lb) < 200 lb (90.7 kg)     Try to eat less sugar; cookies  and cakes      . Weight (lb) < 200 lb (90.7 kg)     Losing weight will help with your PRE Diabetes and help you to  feel better       Depression Screen PHQ 2/9 Scores 07/30/2020 04/16/2020 10/16/2019 10/24/2018 10/10/2018 03/01/2018 10/26/2017  PHQ - 2 Score 0 0 0 0 1 0 0  PHQ- 9 Score - 0 - - - - -    Fall Risk Fall Risk  07/30/2020 10/16/2019 07/03/2019 10/24/2018 10/10/2018  Falls in the past year? 0 0 0 - 0  Number falls in past yr: 0 0 0 0 -  Injury with Fall? 0 0 0 - -  Risk for fall due to : Impaired balance/gait;Impaired mobility;Impaired vision - - - -  Follow up Falls prevention discussed - Education provided - -    Any stairs in or around the home? Yes  If so, are there any without handrails? No  Home free of loose throw rugs in walkways, pet beds, electrical cords, etc? Yes  Adequate lighting in your home to reduce risk of falls? Yes   ASSISTIVE DEVICES UTILIZED TO PREVENT FALLS:  Life alert? No  Use of a cane, walker or w/c? No  Grab bars in the bathroom? No  Shower chair or bench in shower? No  Elevated toilet seat or a handicapped toilet? Yes   TIMED UP AND GO:  Was the test performed? No     Cognitive Function: MMSE - Mini Mental State Exam 03/01/2018 10/18/2016  Not completed: (No Data) -  Orientation to time - 5  Orientation to Place - 5  Registration - 3  Attention/ Calculation - 5  Recall - 3  Language- name 2 objects - 2  Language- repeat - 1  Language- follow 3 step command - 3  Language- read & follow direction - 1  Write a sentence - 1  Copy design - 1  Total score - 30     6CIT Screen 07/30/2020  What Year? 0 points  What month? 0 points  Count back from 20 0 points  Months in reverse 0 points  Repeat phrase 0 points    Immunizations Immunization History  Administered Date(s) Administered  . Influenza Split 08/19/2011, 08/17/2012  . Influenza Whole 10/23/2007, 08/14/2009, 08/13/2010, 08/23/2019  . Influenza, High Dose Seasonal PF 09/13/2016, 09/01/2017, 08/22/2018, 08/21/2019  . Influenza,inj,Quad PF,6+ Mos 08/16/2013  . Influenza-Unspecified  08/28/2014, 09/07/2015, 09/01/2017, 09/13/2018  . Moderna SARS-COVID-2 Vaccination 12/27/2019, 01/24/2020  . Pneumococcal Conjugate-13 06/08/2015  . Pneumococcal Polysaccharide-23 04/25/2014  . Td 11/14/2000, 09/21/2010  . Zoster Recombinat (Shingrix) 09/24/2018    TDAP status: Up to date Flu Vaccine status: Up to date Pneumococcal vaccine status: Up to date Covid-19 vaccine status: Completed vaccines  Qualifies for Shingles Vaccine? Yes   Zostavax completed Yes   Shingrix Completed?: No.    Education has been provided regarding the importance of this vaccine. Patient has been advised to call insurance company to determine out of pocket expense if they have not yet received this vaccine. Advised may also receive vaccine at local pharmacy or Health Dept. Verbalized acceptance and understanding.  Screening Tests Health Maintenance  Topic Date Due  . INFLUENZA VACCINE  06/14/2020  . TETANUS/TDAP  09/21/2020  . COLONOSCOPY  03/07/2021  . COVID-19 Vaccine  Completed  . Hepatitis C Screening  Completed  . PNA vac Low Risk Adult  Completed    Health Maintenance  Health Maintenance Due  Topic Date  Due  . INFLUENZA VACCINE  06/14/2020    Colorectal cancer screening: Completed 03/07/18. Repeat every 3 years    Additional Screening:  Hepatitis C Screening: Completed 09/13/16  Vision Screening: Recommended annual ophthalmology exams for early detection of glaucoma and other disorders of the eye. Is the patient up to date with their annual eye exam?  Yes  Who is the provider or what is the name of the office in which the patient attends annual eye exams? Dr Frederico Hamman   Dental Screening: Recommended annual dental exams for proper oral hygiene  Community Resource Referral / Chronic Care Management: CRR required this visit?  No   CCM required this visit?  No      Plan:     I have personally reviewed and noted the following in the patient's chart:   . Medical and social  history . Use of alcohol, tobacco or illicit drugs  . Current medications and supplements . Functional ability and status . Nutritional status . Physical activity . Advanced directives . List of other physicians . Hospitalizations, surgeries, and ER visits in previous 12 months . Vitals . Screenings to include cognitive, depression, and falls . Referrals and appointments  In addition, I have reviewed and discussed with patient certain preventive protocols, quality metrics, and best practice recommendations. A written personalized care plan for preventive services as well as general preventive health recommendations were provided to patient.     Willette Brace, LPN   [id number]   Nurse Notes: None

## 2020-08-18 ENCOUNTER — Encounter: Payer: Self-pay | Admitting: Podiatry

## 2020-08-18 ENCOUNTER — Ambulatory Visit (INDEPENDENT_AMBULATORY_CARE_PROVIDER_SITE_OTHER): Payer: Medicare HMO | Admitting: Podiatry

## 2020-08-18 ENCOUNTER — Other Ambulatory Visit: Payer: Self-pay

## 2020-08-18 DIAGNOSIS — M722 Plantar fascial fibromatosis: Secondary | ICD-10-CM

## 2020-08-18 MED ORDER — METHYLPREDNISOLONE 4 MG PO TBPK
ORAL_TABLET | ORAL | 0 refills | Status: DC
Start: 2020-08-18 — End: 2020-10-19

## 2020-08-19 NOTE — Progress Notes (Signed)
He presents today for follow-up of his plantar fasciitis right.  States it really has not got a whole lot better yet.  He continues to wear his plantar fascial brace.  Objective: Vital signs are stable alert oriented x3.  Pulses are palpable.  There is no erythema edema cellulitis drainage or odor has pain on palpation medial care tubercle of the right heel.  Assessment: Plantar fasciitis right.  Plan: I reinjected the right heel today placed him on a Medrol Dosepak instructed him to continue his plantar fascial brace and tennis shoes I will follow-up with him in the near future.  If this does not resolve his symptoms at all we will have to consider MRI for tear.

## 2020-08-27 ENCOUNTER — Ambulatory Visit: Payer: Medicare HMO | Admitting: Podiatry

## 2020-09-01 ENCOUNTER — Telehealth: Payer: Self-pay

## 2020-09-01 NOTE — Telephone Encounter (Signed)
See below. Ok to use a same day slot for next week.

## 2020-09-01 NOTE — Telephone Encounter (Signed)
Called and lm for pt tcb.

## 2020-09-01 NOTE — Telephone Encounter (Signed)
Patient is calling in this morning stating he has been having some heel pain, followed up with his podiatrist and he was prescribed prednisone, but is wondering if it is safe to take even with his blood sugar issues. No appointments until December, could we use same day next week? Did offer another appointment with different provider but [name] would like to follow up with Dr.Hunter.

## 2020-09-01 NOTE — Telephone Encounter (Signed)
If his main question is if prednisone is safe for him to use-I think a short-term course is reasonable. This will increase blood sugar levels some but should be short-term and they should come back down. Have him remind me of this and recheck next A1c as I will need to account for that.. I do not think he has to have a visit at this time unless he has further questions about this-if he does I am okay with same-day slot

## 2020-09-03 ENCOUNTER — Telehealth: Payer: Self-pay | Admitting: Podiatry

## 2020-09-03 MED ORDER — METHYLPREDNISOLONE 4 MG PO TBPK: ORAL_TABLET | ORAL | 0 refills | Status: DC

## 2020-09-03 NOTE — Telephone Encounter (Signed)
Patient would like a refill on Prednisone. He is having severe heel pain in his right foot again.

## 2020-09-03 NOTE — Addendum Note (Signed)
Addended by: Boneta Lucks on: 09/03/2020 09:22 AM   Modules accepted: Orders

## 2020-09-14 ENCOUNTER — Telehealth: Payer: Self-pay | Admitting: Family Medicine

## 2020-09-14 DIAGNOSIS — H43812 Vitreous degeneration, left eye: Secondary | ICD-10-CM | POA: Diagnosis not present

## 2020-09-14 DIAGNOSIS — H2513 Age-related nuclear cataract, bilateral: Secondary | ICD-10-CM | POA: Diagnosis not present

## 2020-09-14 DIAGNOSIS — H0289 Other specified disorders of eyelid: Secondary | ICD-10-CM | POA: Diagnosis not present

## 2020-09-14 DIAGNOSIS — H43392 Other vitreous opacities, left eye: Secondary | ICD-10-CM | POA: Diagnosis not present

## 2020-09-14 NOTE — Progress Notes (Signed)
  Chronic Care Management   Outreach Note  09/14/2020 Name: [name] MRN: [id number] DOB: 11-21-1945  Referred by: Marin Olp, MD Reason for referral : No chief complaint on file.   An unsuccessful telephone outreach was attempted today. The patient was referred to the pharmacist for assistance with care management and care coordination.   Follow Up Plan:   Lauretta Grill Upstream Scheduler

## 2020-09-14 NOTE — Chronic Care Management (AMB) (Signed)
  Chronic Care Management   Note  09/14/2020 Name: [name] MRN: [id number] DOB: 03/12/1946  [name] is a 74 y.o. year old male who is a primary care patient of Yong Channel, Brayton Mars, MD. I reached out to [name] by phone today in response to a referral sent by [name] PCP, Marin Olp, MD.   [name] was given information about Chronic Care Management services today including:  1. CCM service includes personalized support from designated clinical staff supervised by his physician, including individualized plan of care and coordination with other care providers 2. 24/7 contact phone numbers for assistance for urgent and routine care needs. 3. Service will only be billed when office clinical staff spend 20 minutes or more in a month to coordinate care. 4. Only one practitioner may furnish and bill the service in a calendar month. 5. The patient may stop CCM services at any time (effective at the end of the month) by phone call to the office staff.   Patient agreed to services and verbal consent obtained.   Follow up plan:   Lauretta Grill Upstream Scheduler

## 2020-09-22 ENCOUNTER — Ambulatory Visit: Payer: Medicare HMO | Admitting: Podiatry

## 2020-09-29 ENCOUNTER — Ambulatory Visit: Payer: Medicare HMO

## 2020-10-16 DIAGNOSIS — Z0100 Encounter for examination of eyes and vision without abnormal findings: Secondary | ICD-10-CM | POA: Diagnosis not present

## 2020-10-18 NOTE — Patient Instructions (Addendum)
Health Maintenance Due  Topic Date Due  . TETANUS/TDAP Cheaper to receive at your local pharmacy so would encourage you to schedule there  patient is not sure he had shingrix x1 listed 09/24/18 (he is going to check with [facility]- encouraged him to do so. Tdap also due.  09/21/2020   Blood pressure well controlled and has bene last few visits. He would like to try doing just one irbesartan-hctz 150-12.15m daily and he will monitor blood pressure at home- I would love if home #s <135/85  Glad you are doing well overall!   Please stop by lab before you go If you have mychart- we will send your results within 3 business days of [place] them.  If you do not have mychart- we will call you about results within 5 business days of [place] them.  *please note we are currently using [facility] which has a longer processing time than [facility] [facility] typically so labs may not come back as quickly as in the past *please also note that you will see labs on mychart as soon as they post. I will later go in and write notes on them- will say "notes from Dr. HYong Channel

## 2020-10-18 NOTE — Progress Notes (Signed)
Phone: [phone]   Subjective:  Patient presents today for their annual physical. Chief complaint-noted.   See problem oriented charting- ROS- full  review of systems was completed and negative  except for: allergies and sneezing  The following were reviewed and entered/updated in epic: Past Medical History:  Diagnosis Date  . Adenomatous polyps 03/2004, 11/2012  . Arthritis   . Bradycardia 2018  . Clotting disorder (HCC)    on plavix  . Coronary atherosclerosis of unspecified type of vessel, native or graft   . Esophagitis 1993  . GERD (gastroesophageal reflux disease)   . Hyperlipidemia   . Hypertension   . Myocardial infarction ([place]) 1993  . Shingles 2019  . Sleep apnea    cpap  . Stroke ([place]) 02/2014  . Trigger finger (acquired)   . 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 UTI (urinary tract infection) 2019   Patient Active Problem List   Diagnosis Date Noted  . ULCERATIVE COLITIS-LEFT SIDE 07/03/2008    Priority: High  . CAD (coronary artery disease) 06/15/2007    Priority: High  . Hyperglycemia 06/08/2015    Priority: Medium  . CKD (chronic kidney disease), stage III ([place]) 10/24/2014    Priority: Medium  . Sleep apnea 07/02/2008    Priority: Medium  . Hyperlipidemia 06/14/2007    Priority: Medium  . Essential hypertension 06/14/2007    Priority: Medium  . History of colonic polyps 06/14/2007    Priority: Medium  . Postherpetic neuralgia 03/19/2018    Priority: Low  . Balanitis 03/06/2017    Priority: Low  . OA (osteoarthritis) of knee 12/07/2015    Priority: Low  . Former smoker 06/08/2015    Priority: Low  . Edema 02/04/2015    Priority: Low  . Allergic rhinitis 10/24/2014    Priority: Low  . Obesity (BMI 30-39.9) 04/25/2014    Priority: Low  . Right cervical radiculopathy 04/12/2013    Priority: Low  . Low back pain 01/11/2008    Priority: Low  . GERD 11/07/2007    Priority: Low  . Trigger finger, acquired 10/23/2007     Priority: Low  . Actinic keratosis 10/10/2018   Past Surgical History:  Procedure Laterality Date  . ANGIOPLASTY  1993  . CHOLECYSTECTOMY  2002  . LUMBAR FUSION  1983  . ROTATOR CUFF REPAIR  2009   right  . TRIGGER FINGER RELEASE     right    Family History  Problem Relation Age of Onset  . Stroke Mother   . COPD Father   . Heart disease Sister        valve replacement  . CAD Brother   . Heart attack Brother   . Colon cancer Neg Hx   . Esophageal cancer Neg Hx   . Liver disease Neg Hx   . Diabetes Neg Hx   . Rectal cancer Neg Hx     Medications- reviewed and updated Current Outpatient Medications  Medication Sig Dispense Refill  . acyclovir ointment (ZOVIRAX) 5 % Apply topically every 3 (three) hours. For fever blisters 15 g 2  . atenolol (TENORMIN) 25 MG tablet Take 0.5 tablets (12.5 mg total) by mouth daily. 45 tablet 3  . atorvastatin (LIPITOR) 10 MG tablet TAKE 1 TABLET EVERY DAY 90 tablet 3  . clopidogrel (PLAVIX) 75 MG tablet TAKE 1 TABLET EVERY DAY 90 tablet 3  . famotidine (PEPCID) 20 MG tablet Take 20 mg by mouth daily.    . fluticasone (FLONASE) 50 MCG/ACT nasal spray  INHALE 1 SPRAY INTO EACH NOSTRIL ONCE DAILY. 16 g 5  . irbesartan-hydrochlorothiazide (AVALIDE) 150-12.5 MG tablet TAKE 2 TABLETS EVERY DAY 180 tablet 3  . mercaptopurine (PURINETHOL) 50 MG tablet TAKE 1 AND 1/2 TABLETS BY MOUTH DAILY. GIVE ON AN EMPTY STOMACH 1 HOUR BEFORE OR 2 HOURS AFTER A MEAL. 45 tablet 5  . tamsulosin (FLOMAX) 0.4 MG CAPS capsule Take 0.4 mg by mouth daily.     . nitroGLYCERIN (NITROSTAT) 0.4 MG SL tablet Place 1 tablet (0.4 mg total) under the tongue every 5 (five) minutes as needed for chest pain. (Patient not taking: Reported on 07/30/2020) 20 tablet 5   No current facility-administered medications for this visit.    Allergies-reviewed and updated Allergies  Allergen Reactions  . Penicillins Rash    REACTION: rash    Social History   Social History Narrative    Widower January 2019.. 3 children- son is a patient here. 4 grandkids.       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 Oropharynx normal Neck: no thyromegaly CV: RRR no murmurs (faint possible murmur but not consistent- has cards visit in January as well and can recheck) rubs or gallops Lungs: CTAB no crackles, wheeze, rhonchi Abdomen: soft/nontender/nondistended/normal bowel sounds. No rebound or guarding.  Ext: no edema Skin: warm, dry Neuro: grossly normal, moves all extremities, PERRLA    Assessment and Plan  74 y.o. male presenting for annual physical.  Health Maintenance counseling: 1. Anticipatory guidance: Patient counseled regarding regular dental exams -q6 months, eye exams -yearly,  avoiding smoking and second hand smoke , limiting alcohol to 2 beverages per day - doesn't drink.   2. Risk factor reduction:  Advised patient of need for regular exercise and diet rich and fruits and vegetables to reduce risk of heart attack and stroke. Exercise- not exercising- would encourage building up to 150 minutes a week. Diet-discussed ways to improve diet.  Wt Readings from Last 3 Encounters:  10/19/20 199 lb 12.8 oz (90.6 kg)  07/30/20 198 lb (89.8 kg)  06/02/20 198 lb (89.8 kg)  3. Immunizations/screenings/ancillary studies- patient is not sure he had shingrix x1 listed 09/24/18 (he is going to check with [facility]- encouraged him to do so. Tdap also due.  Immunization History  Administered Date(s) Administered  . Fluad Quad(high Dose 65+)  08/31/2020  . Influenza Split 08/19/2011, 08/17/2012  . Influenza Whole 10/23/2007, 08/14/2009, 08/13/2010, 08/23/2019  . Influenza, High Dose Seasonal PF 09/13/2016, 09/01/2017, 08/22/2018, 08/21/2019  . Influenza,inj,Quad PF,6+ Mos 08/16/2013  . Influenza-Unspecified 08/28/2014, 09/07/2015, 09/01/2017, 09/13/2018  . Moderna SARS-COVID-2 Vaccination 12/27/2019, 01/24/2020, 09/28/2020  . Pneumococcal Conjugate-13 06/08/2015  . Pneumococcal Polysaccharide-23 04/25/2014  . Td 11/14/2000, 09/21/2010  . Zoster Recombinat (Shingrix) 09/24/2018  4. Prostate cancer screening-  on flomax. Follows with Dr. Gilford Rile. Last PSA came down. Sees them again in February. Defer PSA for now Lab Results  Component Value Date  PSA 1.58 04/16/2020   PSA 3.17 10/26/2019   PSA 3.17 10/16/2019   5. Colon cancer screening - 03/07/18 with 3 year repeat 6. Skin cancer screening- Dr. [place] Crane dermatology- encouraged follow up . advised regular sunscreen use. Denies worrisome, changing, or new skin lesions.  7.former smoker-. 4 pack years quit 1972. aaa declined.  8. STD screening - not dating and declines   Status of chronic or acute concerns   #CAD- follows with Dr. Harrington Challenger #hyperlipidemia S: Medication:atorvasatin 25m daily, plavix 75 mg  No CP or SOB.  Not haivng to use nitroglycerin Lab Results  Component Value Date   CHOL 116 10/16/2019   HDL 30.20 (L) 10/16/2019   LDLCALC 64 10/16/2019   TRIG 108.0 10/16/2019   CHOLHDL 4 10/16/2019   A/P: CAD stable. HLD hopefully at goal- update lipids  # Hyperglycemia/insulin resistance/prediabetes S:  Medication: none Exercise and diet- not exercising- encouraged to start. Weight overall pretty stable.  Lab Results  Component Value Date   HGBA1C 5.9 04/16/2020   HGBA1C 5.8 10/16/2019   HGBA1C 5.9 10/10/2018  A/P:  Hopefully stable- update a1c with labs- encouraged healthy eating/regular exercise  #hypertension S: medication: atenolol 12.5 mg daily,  irbesartan-hctz 150-12.5 mg takes 2 tablets BP Readings from Last 3 Encounters:  10/19/20 128/70  06/02/20 110/66  04/16/20 110/62  A/P: Stable. Blood pressure well controlled and has bene last few visits. He would like to try doing just one irbesartan-hctz 150-12.522mdaily and he will monitor blood pressure at home- I would love if home #s <135/85  # foot better after seeing podiatry and having 2 injections  % ulcerative colitis- continues to follow up with Dr. StFuller PlanRemains on mercaptopurine  #pepcid reasonably controlling GERD- continue current meds  Recommended follow up: Return in about 6 months (around 04/19/2021) for follow up- or sooner if needed. Future Appointments  Date Time Provider [place]1/01/2021  3:30 PM [facility] CCM PHARMACIST [facility]  12/08/2020  1:20 PM RoFay RecordsMD [facility]  08/05/2021  1:45 PM [facility] HEALTH COACH [facility]   Lab/Order associations: fasting   ICD-10-CM   1. Preventative health care  Z00.00 CBC With Differential/Platelet    COMPLETE METABOLIC PANEL WITH GFR    Lipid Panel w/reflex Direct LDL    Hemoglobin A1c  2. Essential hypertension  I10   3. Gastroesophageal reflux disease, unspecified whether esophagitis present  K21.9   4. Stage 3 chronic kidney disease, unspecified whether stage 3a or 3b CKD (HCC)  N18.30   5. Hyperlipidemia, unspecified hyperlipidemia type  E78.5 CBC With Differential/Platelet    COMPLETE METABOLIC PANEL WITH GFR    Lipid Panel w/reflex Direct LDL  6. Hyperglycemia  R73.9 Hemoglobin A1c   Return precautions advised.  StGarret ReddishMD

## 2020-10-19 ENCOUNTER — Other Ambulatory Visit: Payer: Self-pay

## 2020-10-19 ENCOUNTER — Ambulatory Visit (INDEPENDENT_AMBULATORY_CARE_PROVIDER_SITE_OTHER): Payer: Medicare HMO | Admitting: Family Medicine

## 2020-10-19 ENCOUNTER — Encounter: Payer: Self-pay | Admitting: Family Medicine

## 2020-10-19 VITALS — BP 128/70 | HR 59 | Temp 98.7°F | Resp 18 | Ht 68.0 in | Wt 199.8 lb

## 2020-10-19 DIAGNOSIS — N183 Chronic kidney disease, stage 3 unspecified: Secondary | ICD-10-CM

## 2020-10-19 DIAGNOSIS — I10 Essential (primary) hypertension: Secondary | ICD-10-CM

## 2020-10-19 DIAGNOSIS — K219 Gastro-esophageal reflux disease without esophagitis: Secondary | ICD-10-CM

## 2020-10-19 DIAGNOSIS — R739 Hyperglycemia, unspecified: Secondary | ICD-10-CM

## 2020-10-19 DIAGNOSIS — E785 Hyperlipidemia, unspecified: Secondary | ICD-10-CM

## 2020-10-19 DIAGNOSIS — Z0000 Encounter for general adult medical examination without abnormal findings: Secondary | ICD-10-CM

## 2020-10-20 LAB — COMPLETE METABOLIC PANEL WITH GFR
AG Ratio: 1.4 (calc) (ref 1.0–2.5)
ALT: 26 U/L (ref 9–46)
AST: 19 U/L (ref 10–35)
Albumin: 3.9 g/dL (ref 3.6–5.1)
Alkaline phosphatase (APISO): 54 U/L (ref 35–144)
BUN/Creatinine Ratio: 9 (calc) (ref 6–22)
BUN: 11 mg/dL (ref 7–25)
CO2: 32 mmol/L (ref 20–32)
Calcium: 9.8 mg/dL (ref 8.6–10.3)
Chloride: 98 mmol/L (ref 98–110)
Creat: 1.22 mg/dL — ABNORMAL HIGH (ref 0.70–1.18)
GFR, Est African American: 68 mL/min/{1.73_m2} (ref >=60)
GFR, Est Non African American: 58 mL/min/{1.73_m2} — ABNORMAL LOW (ref >=60)
Globulin: 2.8 g/dL (calc) (ref 1.9–3.7)
Glucose, Bld: 95 mg/dL (ref 65–99)
Potassium: 3.8 mmol/L (ref 3.5–5.3)
Sodium: 138 mmol/L (ref 135–146)
Total Bilirubin: 1.1 mg/dL (ref 0.2–1.2)
Total Protein: 6.7 g/dL (ref 6.1–8.1)

## 2020-10-20 LAB — CBC WITH DIFFERENTIAL/PLATELET
Absolute Monocytes: 673 cells/uL (ref 200–950)
Basophils Absolute: 79 cells/uL (ref 0–200)
Basophils Relative: 1.2 %
Eosinophils Absolute: 211 cells/uL (ref 15–500)
Eosinophils Relative: 3.2 %
HCT: 47.6 % (ref 38.5–50.0)
Hemoglobin: 16 g/dL (ref 13.2–17.1)
Lymphs Abs: 1630 cells/uL (ref 850–3900)
MCH: 30.1 pg (ref 27.0–33.0)
MCHC: 33.6 g/dL (ref 32.0–36.0)
MCV: 89.5 fL (ref 80.0–100.0)
MPV: 9.7 fL (ref 7.5–12.5)
Monocytes Relative: 10.2 %
Neutro Abs: 4006 cells/uL (ref 1500–7800)
Neutrophils Relative %: 60.7 %
Platelets: 236 10*3/uL (ref 140–400)
RBC: 5.32 10*6/uL (ref 4.20–5.80)
RDW: 15 % (ref 11.0–15.0)
Total Lymphocyte: 24.7 %
WBC: 6.6 10*3/uL (ref 3.8–10.8)

## 2020-10-20 LAB — LIPID PANEL W/REFLEX DIRECT LDL
Cholesterol: 119 mg/dL (ref <200)
HDL: 35 mg/dL — ABNORMAL LOW (ref >=40)
LDL Cholesterol (Calc): 66 mg/dL (calc)
Non-HDL Cholesterol (Calc): 84 mg/dL (calc) (ref <130)
Total CHOL/HDL Ratio: 3.4 (calc) (ref <5.0)
Triglycerides: 98 mg/dL (ref <150)

## 2020-10-20 LAB — HEMOGLOBIN A1C
Hgb A1c MFr Bld: 5.5 % of total Hgb (ref <5.7)
Mean Plasma Glucose: 111 mg/dL
eAG (mmol/L): 6.2 mmol/L

## 2020-11-04 ENCOUNTER — Other Ambulatory Visit: Payer: Self-pay | Admitting: Family Medicine

## 2020-11-16 ENCOUNTER — Ambulatory Visit: Payer: Medicare HMO

## 2020-11-16 DIAGNOSIS — E785 Hyperlipidemia, unspecified: Secondary | ICD-10-CM

## 2020-11-16 DIAGNOSIS — R739 Hyperglycemia, unspecified: Secondary | ICD-10-CM

## 2020-11-16 DIAGNOSIS — I10 Essential (primary) hypertension: Secondary | ICD-10-CM

## 2020-11-16 NOTE — Patient Instructions (Signed)
[name],  Thank you for taking the time to review your medications with me today.  I have included our care plan/goals in the following pages. Please review and call me at [phone] with any questions!  Thanks! Ellin Mayhew, Pharm.D., BCGP Clinical Pharmacist [facility] [phone]  Goals Addressed            This Visit's Progress   . PharmD Care Plan       CARE PLAN ENTRY (see longitudinal plan of care for additional care plan information)  Current Barriers:  . Chronic Disease Management support, education, and care coordination needs related to Hypertension, Hyperlipidemia, and hyperglycemia (prediabetes)   Hypertension BP Readings from Last 3 Encounters:  10/19/20 128/70  06/02/20 110/66  04/16/20 110/62   . Pharmacist Clinical Goal(s): o Over the next 365 days, patient will work with PharmD and providers to maintain BP goal <140/90 . Current regimen:  o Irbesartan-hydrochlorothiazide 150-12.5 mg tablet twice daily o Atenolol 25 mg tablet - take half tablet (12.5 mg once daily) . Interventions: o Reviewed home monitoring of blood pressure o Reviewed diet - Maintain a healthy weight and exercise regularly, as directed by your health care provider. Eat healthy foods, such as: Lean proteins, complex carbohydrates, fresh fruits and vegetables, low-fat dairy products, healthy fats. . Patient self care activities - Over the next 365 days, patient will: o Check BP at least once every 1-2 weeks, document, and provide at future appointments o Start back at [facility] if comfortable and use stationary bike, treadmill, leg exercise - goal ~150 minutes of exercise per week. o Ensure daily salt intake < 2300 mg/day  Hyperlipidemia Lab Results  Component Value Date/Time   LDLCALC 66 10/19/2020 11:41 AM   . Pharmacist Clinical Goal(s): o Over the next 365 days, patient will work with PharmD and providers to maintain LDL goal <  70 . Current regimen:  o Atorvastatin 10 mg once daily . Interventions: o Reviewed diet / exercise - see high blood pressure . Patient self care activities - Over the next 365 days, patient will: o Continue current management  Hyperglycemia (Prediabetes) Lab Results  Component Value Date/Time   HGBA1C 5.5 10/19/2020 11:41 AM   HGBA1C 5.9 04/16/2020 01:51 PM   . Pharmacist Clinical Goal(s): o Over the next 365 days, patient will work with PharmD and providers to maintain A1c goal  <5.7% . Current regimen:  o N/a - no current medications . Interventions: o Reviewed diet / exercise - see high blood pressure . Patient self care activities - Over the next 365 days, patient will: o Continue current management  Medication management . Pharmacist Clinical Goal(s): o Over the next 365 days, patient will work with PharmD and providers to maintain optimal medication adherence . Current pharmacy: [place] [facility] . Interventions o Comprehensive medication review performed. o Continue current medication management strategy . Patient self care activities - Over the next 365 days, patient will: o Take medications as prescribed o Report any questions or concerns to PharmD and/or provider(s) Initial goal documentation.      The patient verbalized understanding of instructions provided today and agreed to receive a mailed copy of patient instruction and/or educational materials. Telephone follow up appointment with pharmacy team member scheduled for: See next appointment with "Care Management Staff" under "What's Next" below.   Hypertension, Adult High blood pressure (hypertension) is when the force of blood pumping through the arteries  is too strong. The arteries are the blood vessels that carry blood from the heart throughout the body. Hypertension forces the heart to work harder to pump blood and may cause arteries to become narrow or stiff. Untreated or uncontrolled  hypertension can cause a heart attack, heart failure, a stroke, kidney disease, and other problems. A blood pressure reading consists of a higher number over a lower number. Ideally, your blood pressure should be below 120/80. The first ("top") number is called the systolic pressure. It is a measure of the pressure in your arteries as your heart beats. The second ("bottom") number is called the diastolic pressure. It is a measure of the pressure in your arteries as the heart relaxes. What are the causes? The exact cause of this condition is not known. There are some conditions that result in or are related to high blood pressure. What increases the risk? Some risk factors for high blood pressure are under your control. The following factors may make you more likely to develop this condition:  Smoking.  Having type 2 diabetes mellitus, high cholesterol, or both.  Not getting enough exercise or physical activity.  Being overweight.  Having too much fat, sugar, calories, or salt (sodium) in your diet.  Drinking too much alcohol. Some risk factors for high blood pressure may be difficult or impossible to change. Some of these factors include:  Having chronic kidney disease.  Having a family history of high blood pressure.  Age. Risk increases with age.  Race. You may be at higher risk if you are African American.  Gender. Men are at higher risk than women before age 3. After age 22, women are at higher risk than men.  Having obstructive sleep apnea.  Stress. What are the signs or symptoms? High blood pressure may not cause symptoms. Very high blood pressure (hypertensive crisis) may cause:  Headache.  Anxiety.  Shortness of breath.  Nosebleed.  Nausea and vomiting.  Vision changes.  Severe chest pain.  Seizures. How is this diagnosed? This condition is diagnosed by measuring your blood pressure while you are seated, with your arm resting on a flat surface, your legs  uncrossed, and your feet flat on the floor. The cuff of the blood pressure monitor will be placed directly against the skin of your upper arm at the level of your heart. It should be measured at least twice using the same arm. Certain conditions can cause a difference in blood pressure between your right and left arms. Certain factors can cause blood pressure readings to be lower or higher than normal for a short period of time:  When your blood pressure is higher when you are in a health care provider's office than when you are at home, this is called white coat hypertension. Most people with this condition do not need medicines.  When your blood pressure is higher at home than when you are in a health care provider's office, this is called masked hypertension. Most people with this condition may need medicines to control blood pressure. If you have a high blood pressure reading during one visit or you have normal blood pressure with other risk factors, you may be asked to:  Return on a different day to have your blood pressure checked again.  Monitor your blood pressure at home for 1 week or longer. If you are diagnosed with hypertension, you may have other blood or imaging tests to help your health care provider understand your overall risk for other conditions.  How is this treated? This condition is treated by making healthy lifestyle changes, such as eating healthy foods, exercising more, and reducing your alcohol intake. Your health care provider may prescribe medicine if lifestyle changes are not enough to get your blood pressure under control, and if:  Your systolic blood pressure is above 130.  Your diastolic blood pressure is above 80. Your personal target blood pressure may vary depending on your medical conditions, your age, and other factors. Follow these instructions at home: Eating and drinking   Eat a diet that is high in fiber and potassium, and low in sodium, added sugar, and  fat. An example eating plan is called the DASH (Dietary Approaches to Stop Hypertension) diet. To eat this way: ? Eat plenty of fresh fruits and vegetables. Try to fill one half of your plate at each meal with fruits and vegetables. ? Eat whole grains, such as whole-wheat pasta, brown rice, or whole-grain bread. Fill about one fourth of your plate with whole grains. ? Eat or drink low-fat dairy products, such as skim milk or low-fat yogurt. ? Avoid fatty cuts of meat, processed or cured meats, and poultry with skin. Fill about one fourth of your plate with lean proteins, such as fish, chicken without skin, beans, eggs, or tofu. ? Avoid pre-made and processed foods. These tend to be higher in sodium, added sugar, and fat.  Reduce your daily sodium intake. Most people with hypertension should eat less than 1,500 mg of sodium a day.  Do not drink alcohol if: ? Your health care provider tells you not to drink. ? You are pregnant, may be pregnant, or are planning to become pregnant.  If you drink alcohol: ? Limit how much you use to:  0-1 drink a day for women.  0-2 drinks a day for men. ? Be aware of how much alcohol is in your drink. In the [place] one drink equals one 12 oz bottle of beer (355 mL), one 5 oz glass of wine (148 mL), or one 1 oz glass of hard liquor (44 mL). Lifestyle   Work with your health care provider to maintain a healthy body weight or to lose weight. Ask what an ideal weight is for you.  Get at least 30 minutes of exercise most days of the week. Activities may include walking, swimming, or biking.  Include exercise to strengthen your muscles (resistance exercise), such as Pilates or lifting weights, as part of your weekly exercise routine. Try to do these types of exercises for 30 minutes at least 3 days a week.  Do not use any products that contain nicotine or tobacco, such as cigarettes, e-cigarettes, and chewing tobacco. If you need help quitting, ask your health  care provider.  Monitor your blood pressure at home as told by your health care provider.  Keep all follow-up visits as told by your health care provider. This is important. Medicines  Take over-the-counter and prescription medicines only as told by your health care provider. Follow directions carefully. Blood pressure medicines must be taken as prescribed.  Do not skip doses of blood pressure medicine. Doing this puts you at risk for problems and can make the medicine less effective.  Ask your health care provider about side effects or reactions to medicines that you should watch for. Contact a health care provider if you:  Think you are having a reaction to a medicine you are taking.  Have headaches that keep coming back (recurring).  Feel dizzy.  Have  swelling in your ankles.  Have trouble with your vision. Get help right away if you:  Develop a severe headache or confusion.  Have unusual weakness or numbness.  Feel faint.  Have severe pain in your chest or abdomen.  Vomit repeatedly.  Have trouble breathing. Summary  Hypertension is when the force of blood pumping through your arteries is too strong. If this condition is not controlled, it may put you at risk for serious complications.  Your personal target blood pressure may vary depending on your medical conditions, your age, and other factors. For most people, a normal blood pressure is less than 120/80.  Hypertension is treated with lifestyle changes, medicines, or a combination of both. Lifestyle changes include losing weight, eating a healthy, low-sodium diet, exercising more, and limiting alcohol. This information is not intended to replace advice given to you by your health care provider. Make sure you discuss any questions you have with your health care provider. Document Revised: 07/11/2018 Document Reviewed: 07/11/2018 Elsevier Patient Education  2020 [employer].

## 2020-11-16 NOTE — Progress Notes (Signed)
Chronic Care Management Pharmacy Name: [name]     MRN: [id number]     DOB: 05-04-1946  Chief Complaint/ HPI [name], 75 y.o., male, presents for their initial CCM visit with the clinical pharmacist via telephone due to COVID-19 pandemic.  PCP: [name] Olp, MD Encounter Diagnoses  Name Primary?  . Hyperglycemia Yes  . Hyperlipidemia, unspecified hyperlipidemia type   . Essential hypertension      Patient Active Problem List   Diagnosis Date Noted  . Actinic keratosis 10/10/2018  . Postherpetic neuralgia 03/19/2018  . Balanitis 03/06/2017  . OA (osteoarthritis) of knee 12/07/2015  . Former smoker 06/08/2015  . Hyperglycemia 06/08/2015  . Edema 02/04/2015  . Allergic rhinitis 10/24/2014  . CKD (chronic kidney disease), stage III ([place]) 10/24/2014  . Obesity (BMI 30-39.9) 04/25/2014  . Right cervical radiculopathy 04/12/2013  . ULCERATIVE COLITIS-LEFT SIDE 07/03/2008  . Sleep apnea 07/02/2008  . Low back pain 01/11/2008  . GERD 11/07/2007  . Trigger finger, acquired 10/23/2007  . CAD (coronary artery disease) 06/15/2007  . Hyperlipidemia 06/14/2007  . Essential hypertension 06/14/2007  . History of colonic polyps 06/14/2007   Past Surgical History:  Procedure Laterality Date  . ANGIOPLASTY  1993  . CHOLECYSTECTOMY  2002  . LUMBAR FUSION  1983  . ROTATOR CUFF REPAIR  2009   right  . TRIGGER FINGER RELEASE     right   Family History  Problem Relation Age of Onset  . Stroke Mother   . COPD Father   . Heart disease Sister        valve replacement  . CAD Brother   . Heart attack Brother   . Colon cancer Neg Hx   . Esophageal cancer Neg Hx   . Liver disease Neg Hx   . Diabetes Neg Hx   . Rectal cancer Neg Hx    Allergies  Allergen Reactions  . Penicillins Rash    REACTION: rash   Outpatient Encounter Medications as of 11/16/2020  Medication Sig  . atenolol (TENORMIN) 25 MG tablet TAKE (1/2) TABLET BY MOUTH ONCE DAILY.  Marland Kitchen  atorvastatin (LIPITOR) 10 MG tablet TAKE 1 TABLET EVERY DAY  . clopidogrel (PLAVIX) 75 MG tablet TAKE 1 TABLET EVERY DAY  . famotidine (PEPCID) 20 MG tablet Take 20 mg by mouth daily.  . fluticasone (FLONASE) 50 MCG/ACT nasal spray INHALE 1 SPRAY INTO EACH NOSTRIL ONCE DAILY.  Marland Kitchen irbesartan-hydrochlorothiazide (AVALIDE) 150-12.5 MG tablet TAKE 2 TABLETS EVERY DAY  . mercaptopurine (PURINETHOL) 50 MG tablet TAKE 1 AND 1/2 TABLETS BY MOUTH DAILY. GIVE ON AN EMPTY STOMACH 1 HOUR BEFORE OR 2 HOURS AFTER A MEAL.  . nitroGLYCERIN (NITROSTAT) 0.4 MG SL tablet Place 1 tablet (0.4 mg total) under the tongue every 5 (five) minutes as needed for chest pain. (Patient not taking: Reported on 07/30/2020)  . tamsulosin (FLOMAX) 0.4 MG CAPS capsule Take 0.4 mg by mouth daily.   . [DISCONTINUED] acyclovir ointment (ZOVIRAX) 5 % Apply topically every 3 (three) hours. For fever blisters   No facility-administered encounter medications on file as of 11/16/2020.   Patient Care Team    Relationship Specialty Notifications Start End  [name] Olp, MD PCP - General Family Medicine  10/02/14    Comment: Barbette Reichmann, Carmin Muskrat, MD PCP - Cardiology Cardiology Admissions 11/30/18   Madelin Rear, [facility] Pharmacist Pharmacist  09/14/20    Comment: [phone]   Current Diagnosis/Assessment: Goals Addressed  This Visit's Progress   . PharmD Care Plan       CARE PLAN ENTRY (see longitudinal plan of care for additional care plan information)  Current Barriers:  . Chronic Disease Management support, education, and care coordination needs related to Hypertension, Hyperlipidemia, and hyperglycemia (prediabetes)   Hypertension BP Readings from Last 3 Encounters:  10/19/20 128/70  06/02/20 110/66  04/16/20 110/62   . Pharmacist Clinical Goal(s): o Over the next 365 days, patient will work with PharmD and providers to maintain BP goal <140/90 . Current regimen:  o Irbesartan-hydrochlorothiazide 150-12.5 mg  tablet twice daily o Atenolol 25 mg tablet - take half tablet (12.5 mg once daily) . Interventions: o Reviewed home monitoring of blood pressure o Reviewed diet - Maintain a healthy weight and exercise regularly, as directed by your health care provider. Eat healthy foods, such as: Lean proteins, complex carbohydrates, fresh fruits and vegetables, low-fat dairy products, healthy fats. . Patient self care activities - Over the next 365 days, patient will: o Check BP at least once every 1-2 weeks, document, and provide at future appointments o Start back at [facility] if comfortable and use stationary bike, treadmill, leg exercise - goal ~150 minutes of exercise per week. o Ensure daily salt intake < 2300 mg/day  Hyperlipidemia Lab Results  Component Value Date/Time   LDLCALC 66 10/19/2020 11:41 AM   . Pharmacist Clinical Goal(s): o Over the next 365 days, patient will work with PharmD and providers to maintain LDL goal < 70 . Current regimen:  o Atorvastatin 10 mg once daily . Interventions: o Reviewed diet / exercise - see high blood pressure . Patient self care activities - Over the next 365 days, patient will: o Continue current management  Hyperglycemia (Prediabetes) Lab Results  Component Value Date/Time   HGBA1C 5.5 10/19/2020 11:41 AM   HGBA1C 5.9 04/16/2020 01:51 PM   . Pharmacist Clinical Goal(s): o Over the next 365 days, patient will work with PharmD and providers to maintain A1c goal  <5.7% . Current regimen:  o N/a - no current medications . Interventions: o Reviewed diet / exercise - see high blood pressure . Patient self care activities - Over the next 365 days, patient will: o Continue current management  Medication management . Pharmacist Clinical Goal(s): o Over the next 365 days, patient will work with PharmD and providers to maintain optimal medication adherence . Current pharmacy: [place] [facility] . Interventions o Comprehensive medication  review performed. o Continue current medication management strategy . Patient self care activities - Over the next 365 days, patient will: o Take medications as prescribed o Report any questions or concerns to PharmD and/or provider(s) Initial goal documentation.      Hypertension   BP goal <140/90  BP Readings from Last 3 Encounters:  10/19/20 128/70  06/02/20 110/66  04/16/20 110/62    BMP Latest Ref Rng & Units 10/19/2020 04/16/2020 10/16/2019  Glucose 65 - 99 mg/dL 95 96 93  BUN 7 - 25 mg/dL 11 12 11   Creatinine 0.70 - 1.18 mg/dL 1.22(H) 1.31 1.18  BUN/Creat Ratio 6 - 22 (calc) 9 - -  Sodium 135 - 146 mmol/L 138 136 140  Potassium 3.5 - 5.3 mmol/L 3.8 3.4(L) 3.8  Chloride 98 - 110 mmol/L 98 98 100  CO2 20 - 32 mmol/L 32 31 33(H)  Calcium 8.6 - 10.3 mg/dL 9.8 9.3 9.7   Previous medications: amlodipine, valsartan, valsartan-hctz.  Patient checks BP at home infrequently. Recent home  readings: n/a none provided today. Wants to get back to the [facility], had stopped going due to pandemic. Plans to use treadmill and stationary bicycle and do some leg exercises. Tries to avoid adding salt.  Denies any dizziness or recent falls. Patient is currently at goal on the following medications:  . Irbesartan-hydrochlorothiazide 150-12.5 mg 2 tablets every day  . Atenolol 25 mg tablet - take half tablet (12.5 mg) once daily  We discussed diet/exercise - Maintain a healthy weight and exercise regularly, as directed by your health care provider. Eat healthy foods, such as: Lean proteins, complex carbohydrates, fresh fruits and vegetables, low-fat dairy products, healthy fats.  Reviewed home monitoring recommendations for BP.  Plan  Continue current medications.  Hyperglycemia    A1c goal < 5.7%  Lab Results  Component Value Date/Time   HGBA1C 5.5 10/19/2020 11:41 AM   HGBA1C 5.9 04/16/2020 01:51 PM   HGBA1C 5.8 10/16/2019 09:59 AM   HGBA1C 5.9 10/10/2018 08:54 AM   HGBA1C 6.0  09/18/2017 09:24 AM   GFR 53.64 (L) 04/16/2020 01:51 PM   GFR 60.60 10/16/2019 09:59 AM   GFR 63.36 10/10/2018 08:54 AM   GFR 63.36 10/02/2018 03:57 PM   GFR 70.93 02/06/2018 11:32 AM    Previous medications: n/a. Recent FBG readings: n/a. Most recent a1c 5.5 10/2020 down from 5.9% 04/2020. Patient is currently at goal on: . No current medications  Reviewed recent labs and diet/exercise recommendations.  Plan  Continue current management.  Hyperlipidemia   LDL goal < 70  Lipid Panel     Component Value Date/Time   CHOL 119 10/19/2020 1141   TRIG 98 10/19/2020 1141   TRIG 88 10/20/2006 0927   HDL 35 (L) 10/19/2020 1141   LDLCALC 66 10/19/2020 1141    Hepatic Function Latest Ref Rng & Units 10/19/2020 04/16/2020 10/16/2019  Total Protein 6.1 - 8.1 g/dL 6.7 6.7 6.9  Albumin 3.5 - 5.2 g/dL - 3.9 4.0  AST 10 - 35 U/L 19 20 17   ALT 9 - 46 U/L 26 26 24   Alk Phosphatase 39 - 117 U/L - 57 53  Total Bilirubin 0.2 - 1.2 mg/dL 1.1 0.7 1.0  Bilirubin, Direct 0.0 - 0.3 mg/dL - - -    Previous medications: n/a  No issues with tolerating, denies side effects. Patient is currently at goal the following medications:  . Atorvastatin 10 mg once daily  Secondary prevention. MI '93, CVA Hx. Denies any abnormal bruising, bleeding from nose or gums or blood in urine or stool. Patient is currently controlled on the following medications:  . Plavix 75 mg once daily  Reviewed side effects - none noted.   Plan  Continue current medications.  GERD   Lab Results  Component Value Date   [id number] 03/07/2014  Previous medications: ranitidine  Patient denies recent acid reflux. Using famotidine 20 mg OTC. Currently controlled on: Marland Kitchen Famotidine 20 mg once daily   We discussed: Avoidance of potential triggers such as alcohol, fatty foods, lying down after eating, and tomato sauce.  Plan   Continue current medications.  Vaccines   Immunization History  Administered Date(s)  Administered  . Fluad Quad(high Dose 65+) 08/31/2020  . Influenza Split 08/19/2011, 08/17/2012  . Influenza Whole 10/23/2007, 08/14/2009, 08/13/2010, 08/23/2019  . Influenza, High Dose Seasonal PF 09/13/2016, 09/01/2017, 08/22/2018, 08/21/2019  . Influenza,inj,Quad PF,6+ Mos 08/16/2013  . Influenza-Unspecified 08/28/2014, 09/07/2015, 09/01/2017, 09/13/2018  . Moderna Sars-Covid-2 Vaccination 12/27/2019, 01/24/2020, 09/28/2020  . Pneumococcal Conjugate-13 06/08/2015  .  Pneumococcal Polysaccharide-23 04/25/2014  . Td 11/14/2000, 09/21/2010  . Zoster Recombinat (Shingrix) 09/24/2018   Unclear if either 1st or 2nd shingrix dose received at [place] or other location.  Reviewed and discussed patient's vaccination history.    Plan  Recommended patient receive tdap at pharmacy.  CPA to call pharmacy to confirm shingrix receipt.  Medication Management / Care Coordination   Receives prescription medications from:  [place], [place] [address] [place] [address] Phone: [phone] Fax: [phone]  [facility] [place], [place] [address] Phone: [phone] Fax: [phone]  [place] [address], [place] - [place] [address] [place] [address] [place] [address] Phone: [phone] Fax: [phone]   Denies any issues with current medication management.   Plan  Continue current medication management strategy. ___________________________ SDOH (Social Determinants of Health) assessments performed: Yes. Future Appointments  Date Time Provider [place]  12/08/2020  1:20 PM Fay Records, MD [facility]  04/20/2021  9:40 AM [name] Olp, MD [facility]  08/05/2021  1:45 PM [facility] HEALTH COACH [facility]  08/16/2021  1:30 PM [facility] CCM PHARMACIST [facility]   Visit follow-up:  . CPA follow-up: call  [facility] to see what dates they have for pt receiving shingrix. o  3 month BP call +ask if pt has gotten back to [facility]. Marland Kitchen RPH follow-up: 9 mo f/u telephone visit.  Madelin Rear, Pharm.D., BCGP Clinical Pharmacist [facility] [phone]

## 2020-12-02 ENCOUNTER — Other Ambulatory Visit: Payer: Self-pay | Admitting: Family Medicine

## 2020-12-03 NOTE — Progress Notes (Signed)
Cardiology Office Note   Date:  12/08/2020   ID:  Zade, Falkner 1946-09-17, MRN [id number]  PCP:  Marin Olp, MD  Cardiologist:   Dorris Carnes, MD    F/U of CAD     History of Present Illness: [name] is a 75 y.o. male with a history of CAD (MI in 1993)  Underwent PTCA to LAD  Nuclear scan in 2011 normal  Echo 2016 LVEF normal   I saw him in 2021  Pt denies CP  Breathing is OK    Pt notes dizziness with quick standing  Denies palpitins  Breakfast :  Cereal or waffle or [employer] sausage biscuit Lunch  Sandwich or fast food  Dinner  Meat/ veggies  Current Meds  Medication Sig  . atenolol (TENORMIN) 25 MG tablet TAKE (1/2) TABLET BY MOUTH ONCE DAILY.  Marland Kitchen atorvastatin (LIPITOR) 10 MG tablet TAKE 1 TABLET EVERY DAY  . clopidogrel (PLAVIX) 75 MG tablet TAKE 1 TABLET EVERY DAY  . famotidine (PEPCID) 20 MG tablet Take 20 mg by mouth daily.  . fluticasone (FLONASE) 50 MCG/ACT nasal spray INHALE 1 SPRAY INTO EACH NOSTRIL ONCE DAILY.  Marland Kitchen irbesartan-hydrochlorothiazide (AVALIDE) 150-12.5 MG tablet TAKE 2 TABLETS EVERY DAY  . mercaptopurine (PURINETHOL) 50 MG tablet TAKE 1 AND 1/2 TABLETS BY MOUTH DAILY. GIVE ON AN EMPTY STOMACH 1 HOUR BEFORE OR 2 HOURS AFTER A MEAL. NEEDS APPT FOR FURTHER REFILLS  . nitroGLYCERIN (NITROSTAT) 0.4 MG SL tablet Place 1 tablet (0.4 mg total) under the tongue every 5 (five) minutes as needed for chest pain.  . tamsulosin (FLOMAX) 0.4 MG CAPS capsule Take 0.4 mg by mouth daily.      Allergies:   Penicillins   Past Medical History:  Diagnosis Date  . Adenomatous polyps 03/2004, 11/2012  . Arthritis   . Bradycardia 2018  . Clotting disorder (HCC)    on plavix  . Coronary atherosclerosis of unspecified type of vessel, native or graft   . Esophagitis 1993  . GERD (gastroesophageal reflux disease)   . Hyperlipidemia   . Hypertension   . Myocardial infarction ([place]) 1993  . Shingles 2019  . Sleep apnea    cpap  . Stroke ([place])  02/2014  . Trigger finger (acquired)   . 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 UTI (urinary tract infection) 2019    Past Surgical History:  Procedure Laterality Date  . ANGIOPLASTY  1993  . CHOLECYSTECTOMY  2002  . LUMBAR FUSION  1983  . ROTATOR CUFF REPAIR  2009   right  . TRIGGER FINGER RELEASE     right     Social History:  The patient  reports that he quit smoking about 54 years ago. His smoking use included cigarettes. He has a 3.00 pack-year smoking history. He has quit using smokeless tobacco.  His smokeless tobacco use included chew. He reports that he does not drink alcohol and does not use drugs.   Family History:  The patient's family history includes CAD in his brother; COPD in his father; Heart attack in his brother; Heart disease in his sister; Stroke in his mother.    ROS:  Please see the history of present illness. All other systems are reviewed and  Negative to the above problem except as noted.    PHYSICAL EXAM: VS:  BP 120/82   Pulse 61   Ht 5' 8"  (1.727 m)   Wt 204 lb 9.6 oz (92.8 kg)   BMI  31.11 kg/m   OFH:QRFXJ 75 yo , in no acute distress  HEENT: normal  Neck: no JVD orcarotid bruits,  Cardiac: RRR; no murmurs, rubs, or gallops,no signif edema  Respiratory:  clear to auscultation bilaterally,  GI: soft, nontender, nondistended, + BS  No hepatomegaly  MS: no deformity Moving all extremities   Skin: warm and dry, Rash on face  Neuro:  Strength and sensation are intact Psych: euthymic mood, full affect   EKG:  EKG is ordered today. SB 61 bpm   First degree AV block  PR 214 msec  Lipid Panel    Component Value Date/Time   CHOL 119 10/19/2020 1141   TRIG 98 10/19/2020 1141   TRIG 88 10/20/2006 0927   HDL 35 (L) 10/19/2020 1141   CHOLHDL 3.4 10/19/2020 1141   VLDL 21.6 10/16/2019 0959   LDLCALC 66 10/19/2020 1141      Wt Readings from Last 3 Encounters:  12/08/20 204 lb 9.6 oz (92.8 kg)  10/19/20 199 lb 12.8 oz (90.6 kg)   07/30/20 198 lb (89.8 kg)      ASSESSMENT AND PLAN:  1  CAD  No symptoms angina   Follow.  I encouraged him to increase his  activity   2  HL  LDL is very good at 66  HDL 35   3  HTN  BP is good  Keep on current meds   4  Morbid obesity  Discussed diet, time restricted eating (pt snacks often )   And as noted above, increase activity       F/U in 1 year    Current medicines are reviewed at length with the patient today.  The patient does not have concerns regarding medicines.  Signed, Dorris Carnes, MD  12/08/2020 1:48 PM    [facility] [place], [place], [place]  [address] Phone: [phone]; Fax: [phone]

## 2020-12-04 ENCOUNTER — Other Ambulatory Visit: Payer: Self-pay | Admitting: Gastroenterology

## 2020-12-08 ENCOUNTER — Encounter: Payer: Self-pay | Admitting: Internal Medicine

## 2020-12-08 ENCOUNTER — Ambulatory Visit: Payer: Medicare HMO | Admitting: Internal Medicine

## 2020-12-08 ENCOUNTER — Other Ambulatory Visit: Payer: Self-pay

## 2020-12-08 VITALS — BP 120/82 | HR 61 | Ht 68.0 in | Wt 204.6 lb

## 2020-12-08 DIAGNOSIS — I2510 Atherosclerotic heart disease of native coronary artery without angina pectoris: Secondary | ICD-10-CM | POA: Diagnosis not present

## 2020-12-08 NOTE — Patient Instructions (Signed)
Medication Instructions:  No changes *If you need a refill on your cardiac medications before your next appointment, please call your pharmacy*   Lab Work: none If you have labs (blood work) drawn today and your tests are completely normal, you will receive your results only by: Marland Kitchen MyChart Message (if you have MyChart) OR . A paper copy in the mail If you have any lab test that is abnormal or we need to change your treatment, we will call you to review the results.   Testing/Procedures: none   Follow-Up: At [facility], you and your health needs are our priority.  As part of our continuing mission to provide you with exceptional heart care, we have created designated Provider Care Teams.  These Care Teams include your primary Cardiologist (physician) and Advanced Practice Providers (APPs -  Physician Assistants and Nurse Practitioners) who all work together to provide you with the care you need, when you need it.   Your next appointment:   12 month(s)  The format for your next appointment:   In Person  Provider:   You may see Dorris Carnes, MD or one of the following Advanced Practice Providers on your designated Care Team:    Richardson Dopp, PA-C  Robbie Lis, [place]   Other Instructions

## 2021-01-04 DIAGNOSIS — N401 Enlarged prostate with lower urinary tract symptoms: Secondary | ICD-10-CM | POA: Diagnosis not present

## 2021-01-04 DIAGNOSIS — R3915 Urgency of urination: Secondary | ICD-10-CM | POA: Diagnosis not present

## 2021-01-04 DIAGNOSIS — R3121 Asymptomatic microscopic hematuria: Secondary | ICD-10-CM | POA: Diagnosis not present

## 2021-01-07 ENCOUNTER — Other Ambulatory Visit: Payer: Self-pay | Admitting: Gastroenterology

## 2021-01-09 ENCOUNTER — Other Ambulatory Visit: Payer: Self-pay | Admitting: Gastroenterology

## 2021-01-11 ENCOUNTER — Telehealth: Payer: Self-pay | Admitting: Gastroenterology

## 2021-01-11 MED ORDER — MERCAPTOPURINE 50 MG PO TABS: ORAL_TABLET | ORAL | 1 refills | Status: DC

## 2021-01-11 NOTE — Telephone Encounter (Signed)
Informed patient that the prescription was denied because he is due for follow up visit. Patient scheduled appt for 02/11/21 at 9:50am. Prescription sent to patient's pharmacy until scheduled appt.

## 2021-01-11 NOTE — Telephone Encounter (Signed)
Pt states he is unable to obtain the prescription for the PURINETHOL

## 2021-01-21 ENCOUNTER — Other Ambulatory Visit: Payer: Self-pay | Admitting: Family Medicine

## 2021-01-27 ENCOUNTER — Other Ambulatory Visit: Payer: Self-pay | Admitting: Family Medicine

## 2021-01-28 ENCOUNTER — Ambulatory Visit (HOSPITAL_BASED_OUTPATIENT_CLINIC_OR_DEPARTMENT_OTHER)
Admission: RE | Admit: 2021-01-28 | Discharge: 2021-01-28 | Disposition: A | Discharge status: Home / Self Care | Payer: Medicare HMO | Source: Ambulatory Visit | Attending: Physician Assistant | Admitting: Physician Assistant

## 2021-01-28 ENCOUNTER — Ambulatory Visit (INDEPENDENT_AMBULATORY_CARE_PROVIDER_SITE_OTHER): Payer: Medicare HMO

## 2021-01-28 ENCOUNTER — Ambulatory Visit (INDEPENDENT_AMBULATORY_CARE_PROVIDER_SITE_OTHER): Payer: Medicare HMO | Admitting: Physician Assistant

## 2021-01-28 ENCOUNTER — Other Ambulatory Visit: Payer: Self-pay

## 2021-01-28 ENCOUNTER — Encounter: Payer: Self-pay | Admitting: Physician Assistant

## 2021-01-28 DIAGNOSIS — Y929 Unspecified place or not applicable: Secondary | ICD-10-CM | POA: Diagnosis not present

## 2021-01-28 DIAGNOSIS — R9082 White matter disease, unspecified: Secondary | ICD-10-CM | POA: Insufficient documentation

## 2021-01-28 DIAGNOSIS — M545 Low back pain, unspecified: Secondary | ICD-10-CM | POA: Diagnosis not present

## 2021-01-28 DIAGNOSIS — I6782 Cerebral ischemia: Secondary | ICD-10-CM | POA: Insufficient documentation

## 2021-01-28 DIAGNOSIS — M542 Cervicalgia: Secondary | ICD-10-CM | POA: Diagnosis not present

## 2021-01-28 DIAGNOSIS — I10 Essential (primary) hypertension: Secondary | ICD-10-CM

## 2021-01-28 DIAGNOSIS — R519 Headache, unspecified: Secondary | ICD-10-CM | POA: Insufficient documentation

## 2021-01-28 DIAGNOSIS — R42 Dizziness and giddiness: Secondary | ICD-10-CM | POA: Diagnosis not present

## 2021-01-28 DIAGNOSIS — Z7901 Long term (current) use of anticoagulants: Secondary | ICD-10-CM | POA: Diagnosis not present

## 2021-01-28 DIAGNOSIS — Y939 Activity, unspecified: Secondary | ICD-10-CM | POA: Insufficient documentation
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
## 2021-02-01 ENCOUNTER — Telehealth: Payer: Self-pay

## 2021-02-01 MED ORDER — AZITHROMYCIN 250 MG PO TABS: ORAL_TABLET | ORAL | 0 refills | Status: DC

## 2021-02-01 NOTE — Telephone Encounter (Signed)
Patient states that he has had symptoms for about 5 days. Patient states that the symptoms has worsened. Patient states that he's having tension and pressure in his sinuses and he feels bad all together.

## 2021-02-01 NOTE — Telephone Encounter (Signed)
I sent this in- he is penicillin allergic (would normally use augmentin)

## 2021-02-01 NOTE — Telephone Encounter (Signed)
Unable to reach patient. LMTRC  

## 2021-02-01 NOTE — Telephone Encounter (Signed)
Patient returned call regarding imaging results

## 2021-02-01 NOTE — Telephone Encounter (Signed)
How many days total of symptoms now? Have symptoms improved then worsened or worsened at this point? These are criteria for potential bacterial sinusitis (10 days of symptoms or double sickening) and I need that info before treatment

## 2021-02-01 NOTE — Telephone Encounter (Signed)
Patient notified of results he voices understanding. Patient requesting a Zpak due to sinuses,he tried otc is not working.

## 2021-02-02 NOTE — Telephone Encounter (Signed)
Returned pt call and spoke with pt and made aware this was sent in.

## 2021-02-02 NOTE — Telephone Encounter (Signed)
Patient called in and when I placed patient on hold to have him speak with [facility], he hung up.

## 2021-02-02 NOTE — Telephone Encounter (Signed)
2nd attempt to reach patient, Still no luck, [facility]

## 2021-02-08 ENCOUNTER — Other Ambulatory Visit: Payer: Self-pay | Admitting: Family Medicine

## 2021-02-11 ENCOUNTER — Encounter: Payer: Self-pay | Admitting: Gastroenterology

## 2021-02-11 ENCOUNTER — Ambulatory Visit (INDEPENDENT_AMBULATORY_CARE_PROVIDER_SITE_OTHER): Payer: Medicare HMO | Admitting: Gastroenterology

## 2021-02-11 ENCOUNTER — Other Ambulatory Visit: Payer: Self-pay

## 2021-02-11 ENCOUNTER — Telehealth: Payer: Self-pay

## 2021-02-11 VITALS — BP 100/60 | HR 59 | Ht 68.0 in | Wt 203.0 lb

## 2021-02-11 DIAGNOSIS — K219 Gastro-esophageal reflux disease without esophagitis: Secondary | ICD-10-CM | POA: Diagnosis not present

## 2021-02-11 DIAGNOSIS — Z7902 Long term (current) use of antithrombotics/antiplatelets: Secondary | ICD-10-CM | POA: Diagnosis not present

## 2021-02-11 DIAGNOSIS — Z86010 Personal history of colonic polyps: Secondary | ICD-10-CM

## 2021-02-11 DIAGNOSIS — K5150 Left sided colitis without complications: Secondary | ICD-10-CM

## 2021-02-11 MED ORDER — NA SULFATE-K SULFATE-MG SULF 17.5-3.13-1.6 GM/177ML PO SOLN: 1.0000 | Freq: Once | ORAL | 0 refills | Status: AC

## 2021-02-11 MED ORDER — MERCAPTOPURINE 50 MG PO TABS: ORAL_TABLET | ORAL | 5 refills | Status: DC

## 2021-02-11 NOTE — Telephone Encounter (Signed)
Pt had diplopia in past (more than one spell)  Seen by neuro in 2015 ([place]) who recomm continued Plavix Prob low risk to hold for colonoscipy   Decision to continue should be made by neuro

## 2021-02-11 NOTE — Progress Notes (Signed)
    History of Present Illness: This is a 75 year old male with left-sided ulcerative colitis.  He has no gastrointestinal complaints.  He reports normal bowel movements without bleeding or pain.  His reflux symptoms are well controlled. CBC, CMP from December 2021 were reviewed.  He is due for surveillance colonoscopy.  Current Medications, Allergies, Past Medical History, Past Surgical History, Family History and Social History were reviewed in [employer] record.   Physical Exam: General: Well developed, well nourished, no acute distress Head: Normocephalic and atraumatic Eyes: Sclerae anicteric, EOMI Ears: Normal auditory acuity Mouth: Not examined, mask on during Covid-19 pandemic Lungs: Clear throughout to auscultation Heart: Regular rate and rhythm; no murmurs, rubs or bruits Abdomen: Soft, non tender and non distended. No masses, hepatosplenomegaly or hernias noted. Normal Bowel sounds Rectal: Deferred to colonoscopy Musculoskeletal: Symmetrical with no gross deformities  Pulses:  Normal pulses noted Extremities: No clubbing, cyanosis, edema or deformities noted Neurological: Alert oriented x 4, grossly nonfocal Psychological:  Alert and cooperative. Normal mood and affect   Assessment and Recommendations:  1. Left sided ulcerative colitis. Continue 6MP 75 mg daily.  He would like to consider discontinuing 6-MP.  Based on findings of colonoscopy if there is no disease activity endoscopically or on biopsies will consider discontinuing 6-MP at his option.  Schedule colonoscopy. The risks (including bleeding, perforation, infection, missed lesions, medication reactions and possible hospitalization or surgery if complications occur), benefits, and alternatives to colonoscopy with possible biopsy and possible polypectomy were discussed with the patient and they consent to proceed.   2.  GERD.  Follow antireflux measures.  Continue famotidine 20 mg daily.  3.   Personal history of adenomatous colon polyps.  Colonoscopy as above.  4. Hold Plavix 5 days before procedure - will instruct when and how to resume after procedure. Low but real risk of cardiovascular event such as heart attack, stroke, embolism, thrombosis or ischemia/infarct of other organs off Plavix explained and need to seek urgent help if this occurs. The patient consents to proceed. Will communicate by phone or EMR with patient's prescribing provider to confirm that holding Plavix is reasonable in this case.

## 2021-02-11 NOTE — Telephone Encounter (Signed)
   [name] [birth year] [id number]  Dear Dr. Yong Channel:  We have scheduled the above named patient for a(n) Colonoscopy procedure. Our records show that (s)he is on anticoagulation therapy.  Please advise as to whether the patient may come off their therapy of Plavix  5 days prior to their procedure which is scheduled for 04/28/21.  Please route your response to [employer], CMA.  Sincerely,    [facility]

## 2021-02-11 NOTE — Telephone Encounter (Signed)
Left message for patient to return my call.

## 2021-02-11 NOTE — Telephone Encounter (Signed)
[name] patient can hold Plavix for 5 days prior to colonoscopy.  His CAD has been well controlled under the care of Dr. Harrington Challenger though we have been refilling his Plavix.  I am going to forward to Dr. Harrington Challenger just as an [name]

## 2021-02-11 NOTE — Patient Instructions (Signed)
We have sent the following medications to your pharmacy for you to pick up at your convenience: mercaptopurine.   You have been scheduled for a colonoscopy. Please follow written instructions given to you at your visit today.  Please pick up your prep supplies at the pharmacy within the next 1-3 days. If you use inhalers (even only as needed), please bring them with you on the day of your procedure.   Thank you for choosing me and [facility].  Pricilla Riffle. Dagoberto Ligas., MD., Mar[facility] Regal

## 2021-02-12 NOTE — Telephone Encounter (Signed)
[name]-he may proceed-he is aware of some increased risk of stroke

## 2021-02-12 NOTE — Telephone Encounter (Signed)
Pt returned call and did not wish for the referral, he states he has held this before and hasn't had any problem so he would like for you to ok him to proceed.

## 2021-02-12 NOTE — Telephone Encounter (Signed)
Called and lm for pt tcb.

## 2021-02-12 NOTE — Telephone Encounter (Signed)
Confirmed with patient that he understands to hold Plavix 5 days prior to procedure.

## 2021-02-12 NOTE — Telephone Encounter (Signed)
Dr. Harrington Challenger- thanks so much- that's a great point.   Patient has not seen neurology since I believe 2016- team please call patient and offer repeat referral to neurology for clearance for being off plavix for colonoscopy. If he declines, I am still ok with him moving forward with surgery but tell him there is an increased risk of recurrent stroke during that time frame.

## 2021-02-15 DIAGNOSIS — R3129 Other microscopic hematuria: Secondary | ICD-10-CM | POA: Diagnosis not present

## 2021-02-15 DIAGNOSIS — M160 Bilateral primary osteoarthritis of hip: Secondary | ICD-10-CM | POA: Diagnosis not present

## 2021-02-15 DIAGNOSIS — N3289 Other specified disorders of bladder: Secondary | ICD-10-CM | POA: Diagnosis not present

## 2021-02-15 DIAGNOSIS — N281 Cyst of kidney, acquired: Secondary | ICD-10-CM | POA: Diagnosis not present

## 2021-02-15 DIAGNOSIS — R3121 Asymptomatic microscopic hematuria: Secondary | ICD-10-CM | POA: Diagnosis not present

## 2021-02-25 ENCOUNTER — Telehealth: Payer: Self-pay
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
## 2021-03-15 DIAGNOSIS — R3121 Asymptomatic microscopic hematuria: Secondary | ICD-10-CM | POA: Diagnosis not present

## 2021-04-13 DIAGNOSIS — L718 Other rosacea: Secondary | ICD-10-CM | POA: Diagnosis not present

## 2021-04-13 DIAGNOSIS — L258 Unspecified contact dermatitis due to other agents: Secondary | ICD-10-CM | POA: Diagnosis not present

## 2021-04-19 ENCOUNTER — Telehealth: Payer: Self-pay

## 2021-04-19 NOTE — Telephone Encounter (Signed)
-----   Message from Lindon Romp, [place] sent at 02/12/2021  2:15 PM EDT ----- Call patient and remind him that his colon is on 04/28/21 and to hold plavix 5 days before.

## 2021-04-19 NOTE — Telephone Encounter (Signed)
Patient returned the call and was reminded of information provided in previous message.

## 2021-04-19 NOTE — Telephone Encounter (Signed)
Left message for patient to return call to remind of colonoscopy scheduled for 04-28-2021 and a reminder to hold Plavix starting 5 days prior.  Will continue efforts.

## 2021-04-20 ENCOUNTER — Other Ambulatory Visit: Payer: Self-pay

## 2021-04-20 ENCOUNTER — Encounter: Payer: Self-pay | Admitting: Family Medicine

## 2021-04-20 ENCOUNTER — Ambulatory Visit (INDEPENDENT_AMBULATORY_CARE_PROVIDER_SITE_OTHER): Payer: Medicare HMO | Admitting: Podiatry

## 2021-04-20 ENCOUNTER — Ambulatory Visit (INDEPENDENT_AMBULATORY_CARE_PROVIDER_SITE_OTHER): Payer: Medicare HMO | Admitting: Family Medicine

## 2021-04-20 ENCOUNTER — Encounter: Payer: Self-pay | Admitting: Podiatry

## 2021-04-20 VITALS — BP 114/66 | HR 55 | Temp 97.9°F | Ht 68.0 in | Wt 203.6 lb

## 2021-04-20 DIAGNOSIS — R739 Hyperglycemia, unspecified: Secondary | ICD-10-CM

## 2021-04-20 DIAGNOSIS — M722 Plantar fascial fibromatosis: Secondary | ICD-10-CM

## 2021-04-20 DIAGNOSIS — I2510 Atherosclerotic heart disease of native coronary artery without angina pectoris: Secondary | ICD-10-CM

## 2021-04-20 DIAGNOSIS — I10 Essential (primary) hypertension: Secondary | ICD-10-CM | POA: Diagnosis not present

## 2021-04-20 DIAGNOSIS — I700 Atherosclerosis of aorta: Secondary | ICD-10-CM | POA: Diagnosis not present

## 2021-04-20 DIAGNOSIS — N183 Chronic kidney disease, stage 3 unspecified: Secondary | ICD-10-CM

## 2021-04-20 DIAGNOSIS — E785 Hyperlipidemia, unspecified: Secondary | ICD-10-CM | POA: Diagnosis not present

## 2021-04-20 LAB — COMPREHENSIVE METABOLIC PANEL
ALT: 31 U/L (ref 0–53)
AST: 18 U/L (ref 0–37)
Albumin: 3.7 g/dL (ref 3.5–5.2)
Alkaline Phosphatase: 59 U/L (ref 39–117)
BUN: 18 mg/dL (ref 6–23)
CO2: 32 mEq/L (ref 19–32)
Calcium: 9.7 mg/dL (ref 8.4–10.5)
Chloride: 98 mEq/L (ref 96–112)
Creatinine, Ser: 1.43 mg/dL (ref 0.40–1.50)
GFR: 48.46 mL/min — ABNORMAL LOW (ref >60.00)
Glucose, Bld: 89 mg/dL (ref 70–99)
Potassium: 3.3 mEq/L — ABNORMAL LOW (ref 3.5–5.1)
Sodium: 138 mEq/L (ref 135–145)
Total Bilirubin: 0.7 mg/dL (ref 0.2–1.2)
Total Protein: 6.5 g/dL (ref 6.0–8.3)

## 2021-04-20 LAB — CBC WITH DIFFERENTIAL/PLATELET
Basophils Absolute: 0.1 10*3/uL (ref 0.0–0.1)
Basophils Relative: 0.8 % (ref 0.0–3.0)
Eosinophils Absolute: 0.1 10*3/uL (ref 0.0–0.7)
Eosinophils Relative: 1.8 % (ref 0.0–5.0)
HCT: 45.3 % (ref 39.0–52.0)
Hemoglobin: 15.4 g/dL (ref 13.0–17.0)
Lymphocytes Relative: 30.3 % (ref 12.0–46.0)
Lymphs Abs: 2.3 10*3/uL (ref 0.7–4.0)
MCHC: 34.1 g/dL (ref 30.0–36.0)
MCV: 89.9 fl (ref 78.0–100.0)
Monocytes Absolute: 0.7 10*3/uL (ref 0.1–1.0)
Monocytes Relative: 9.6 % (ref 3.0–12.0)
Neutro Abs: 4.4 10*3/uL (ref 1.4–7.7)
Neutrophils Relative %: 57.5 % (ref 43.0–77.0)
Platelets: 206 10*3/uL (ref 150.0–400.0)
RBC: 5.04 Mil/uL (ref 4.22–5.81)
RDW: 17 % — ABNORMAL HIGH (ref 11.5–15.5)
WBC: 7.6 10*3/uL (ref 4.0–10.5)

## 2021-04-20 LAB — HEMOGLOBIN A1C: Hgb A1c MFr Bld: 6.1 % (ref 4.6–6.5)

## 2021-04-20 MED ORDER — TRIAMCINOLONE ACETONIDE 40 MG/ML IJ SUSP
20.0000 mg | Freq: Once | INTRAMUSCULAR | Status: AC
  Administered 2021-04-20: 20 mg

## 2021-04-20 NOTE — Patient Instructions (Addendum)
Health Maintenance Due  Topic Date Due  . Pneumococcal Vaccine 60-74 Years old (1 of 4 - PCV13) Eligible - but we do not ave today- consider next visit Never done  . Zoster Vaccines- Shingrix (2 of 2) Patient states that he never had the first dose and you guys discussed it the last time he was here. He would like to discuss that again.  11/19/2018  . TETANUS/TDAP Please check with your local pharmacy.  09/21/2020  . COVID-19 Vaccine (4 - Booster for Moderna series) Will schedule appointment little later in the future.  12/29/2020  . COLONOSCOPY (Pts 45-42yr Insurance coverage will need to be confirmed) scheduled 03/07/2021   Please stop by lab before you go If you have mychart- we will send your results within 3 business days of [place] them.  If you do not have mychart- we will call you about results within 5 business days of [place] them.  *please also note that you will see labs on mychart as soon as they post. I will later go in and write notes on them- will say "notes from Dr. HYong Channel  Recommended follow up: Return in about 6 months (around 10/20/2021) for physical or sooner if needed.

## 2021-04-20 NOTE — Progress Notes (Signed)
He presents today after having not seen him for several months chief complaint of I think my plan fasciitis came back.  He states that he may have been wearing some different shoes to cause it to redevelop.  Objective: Vital signs stable he is alert and oriented x3 pulses are palpable.  Palp pulses are strong.  Assessment: Pain in limb secondary to Planter fasciitis right.  Plan: Reinjected the right heel today 20 mg Kenalog, grams Marcaine point of maximal tenderness.  Follow-up with him in 1 month if necessary.
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
## 2021-04-21 ENCOUNTER — Other Ambulatory Visit: Payer: Self-pay

## 2021-04-21 MED ORDER — POTASSIUM CHLORIDE CRYS ER 20 MEQ PO TBCR: EXTENDED_RELEASE_TABLET | ORAL | 3 refills | Status: DC

## 2021-04-22 ENCOUNTER — Telehealth: Payer: Self-pay

## 2021-04-22 NOTE — Telephone Encounter (Signed)
I returned pt call and labs reviewed.

## 2021-04-22 NOTE — Telephone Encounter (Signed)
Patient is returning a call from [place] about lab results.

## 2021-04-27 ENCOUNTER — Encounter: Payer: Self-pay | Admitting: Certified Registered Nurse Anesthetist

## 2021-04-28 ENCOUNTER — Encounter: Payer: Self-pay | Admitting: Gastroenterology

## 2021-04-28 ENCOUNTER — Other Ambulatory Visit: Payer: Self-pay

## 2021-04-28 ENCOUNTER — Ambulatory Visit (AMBULATORY_SURGERY_CENTER): Payer: Medicare HMO | Admitting: Gastroenterology

## 2021-04-28 VITALS — BP 118/75 | HR 58 | Temp 96.9°F | Resp 18 | Ht 68.0 in | Wt 203.0 lb

## 2021-04-28 DIAGNOSIS — K51518 Left sided colitis with other complication: Secondary | ICD-10-CM | POA: Diagnosis not present

## 2021-04-28 DIAGNOSIS — K5190 Ulcerative colitis, unspecified, without complications: Secondary | ICD-10-CM | POA: Diagnosis not present

## 2021-04-28 DIAGNOSIS — Z86010 Personal history of colonic polyps: Secondary | ICD-10-CM | POA: Diagnosis not present

## 2021-04-28 DIAGNOSIS — Z8673 Personal history of transient ischemic attack (TIA), and cerebral infarction without residual deficits: Secondary | ICD-10-CM | POA: Diagnosis not present

## 2021-04-28 DIAGNOSIS — K5150 Left sided colitis without complications: Secondary | ICD-10-CM

## 2021-04-28 DIAGNOSIS — I2510 Atherosclerotic heart disease of native coronary artery without angina pectoris: Secondary | ICD-10-CM | POA: Diagnosis not present

## 2021-04-28 DIAGNOSIS — I252 Old myocardial infarction: Secondary | ICD-10-CM | POA: Diagnosis not present

## 2021-04-28 HISTORY — PX: COLONOSCOPY: SHX174

## 2021-04-28 MED ORDER — SODIUM CHLORIDE 0.9 % IV SOLN: 500.0000 mL | Freq: Once | INTRAVENOUS | Status: DC

## 2021-04-28 NOTE — Op Note (Addendum)
[name] Procedure Date: 04/28/2021 2:06 PM MRN: [id number] Endoscopist: Ladene Artist , MD Age: 75 Referring MD:  Date of Birth: 1946/04/07 Gender: Male Account #: [id number] Procedure:                Colonoscopy Indications:              Surveillance: Personal history of adenomatous                            polyps on last colonoscopy 3 years ago, High risk                            colon cancer surveillance: Ulcerative left sided                            colitis of 8 (or more) years duration Medicines:                Monitored Anesthesia Care Procedure:                Pre-Anesthesia Assessment:                           - Prior to the procedure, a History and Physical                            was performed, and patient medications and                            allergies were reviewed. The patient's tolerance of                            previous anesthesia was also reviewed. The risks                            and benefits of the procedure and the sedation                            options and risks were discussed with the patient.                            All questions were answered, and informed consent                            was obtained. Prior Anticoagulants: The patient has                            taken Plavix (clopidogrel), last dose was 5 days                            prior to procedure. ASA Grade Assessment: III - A                            patient with severe systemic disease. After  reviewing the risks and benefits, the patient was                            deemed in satisfactory condition to undergo the                            procedure.                           After obtaining informed consent, the colonoscope                            was passed under direct vision. Throughout the                            procedure, the patient's blood pressure, pulse, and                             oxygen saturations were monitored continuously. The                            Olympus [id number]) [id number] was introduced                            through the anus and advanced to the the cecum,                            identified by appendiceal orifice and ileocecal                            valve. The ileocecal valve, appendiceal orifice,                            and rectum were photographed. The quality of the                            bowel preparation was excellent. The colonoscopy                            was performed without difficulty. The patient                            tolerated the procedure well. Scope In: 2:15:47 PM Scope Out: 2:28:23 PM Scope Withdrawal Time: 0 hours 10 minutes 41 seconds  Total Procedure Duration: 0 hours 12 minutes 36 seconds  Findings:                 The perianal and digital rectal examinations were                            normal.                           A single medium-mouthed diverticulum was found in  the ascending colon.                           Internal hemorrhoids were found during                            retroflexion. The hemorrhoids were small and Grade                            I (internal hemorrhoids that do not prolapse).                           The exam was otherwise without abnormality on                            direct and retroflexion views. Random biopsies                            obtained. Complications:            No immediate complications. Estimated blood loss:                            None. Estimated Blood Loss:     Estimated blood loss: none. Impression:               - Internal hemorrhoids.                           - Ascending colon diverticulum.                           - The examination was otherwise normal on direct                            and retroflexion views. Random biopsies obtained.                           - No specimens  collected. Recommendation:           - Repeat colonoscopy after studies are complete for                            surveillance based on pathology results.                           - Patient has a contact number available for                            emergencies. The signs and symptoms of potential                            delayed complications were discussed with the                            patient. Return to normal activities tomorrow.  Written discharge instructions were provided to the                            patient.                           - Resume previous diet.                           - Continue present medications.                           - Await pathology results.                           - Resume Plavix (clopidogrel) at prior dose in 2                            days. Refer to managing physician for further                            adjustment of therapy. Ladene Artist, MD 04/28/2021 2:34:07 PM This report has been signed electronically.

## 2021-04-28 NOTE — Patient Instructions (Signed)
Handout given:  hemorrhoids Resume previous diet continue current medications Await pathology results Resume plavix at prior dose in 2 days  YOU HAD AN ENDOSCOPIC PROCEDURE TODAY AT [facility]:   Refer to the procedure report that was given to you for any specific questions about what was found during the examination.  If the procedure report does not answer your questions, please call your gastroenterologist to clarify.  If you requested that your care partner not be given the details of your procedure findings, then the procedure report has been included in a sealed envelope for you to review at your convenience later.  YOU SHOULD EXPECT: Some feelings of bloating in the abdomen. Passage of more gas than usual.  Walking can help get rid of the air that was put into your GI tract during the procedure and reduce the bloating. If you had a lower endoscopy (such as a colonoscopy or flexible sigmoidoscopy) you may notice spotting of blood in your stool or on the toilet paper. If you underwent a bowel prep for your procedure, you may not have a normal bowel movement for a few days.  Please Note:  You might notice some irritation and congestion in your nose or some drainage.  This is from the oxygen used during your procedure.  There is no need for concern and it should clear up in a day or so.  SYMPTOMS TO REPORT IMMEDIATELY:  Following lower endoscopy (colonoscopy or flexible sigmoidoscopy):  Excessive amounts of blood in the stool  Significant tenderness or worsening of abdominal pains  Swelling of the abdomen that is new, acute  Fever of 100F or higher  For urgent or emergent issues, a gastroenterologist can be reached at any hour by calling [phone]. Do not use MyChart messaging for urgent concerns.   DIET:  We do recommend a small meal at first, but then you may proceed to your regular diet.  Drink plenty of fluids but you should avoid alcoholic beverages for 24  hours.  ACTIVITY:  You should plan to take it easy for the rest of today and you should NOT DRIVE or use heavy machinery until tomorrow (because of the sedation medicines used during the test).    FOLLOW UP: Our staff will call the number listed on your records 48-72 hours following your procedure to check on you and address any questions or concerns that you may have regarding the information given to you following your procedure. If we do not reach you, we will leave a message.  We will attempt to reach you two times.  During this call, we will ask if you have developed any symptoms of COVID 19. If you develop any symptoms (ie: fever, flu-like symptoms, shortness of breath, cough etc.) before then, please call [phone].  If you test positive for Covid 19 in the 2 weeks post procedure, please call and report this information to [place].    If any biopsies were taken you will be contacted by phone or by letter within the next 1-3 weeks.  Please call us at [phone] if you have not heard about the biopsies in 3 weeks.   SIGNATURES/CONFIDENTIALITY: You and/or your care partner have signed paperwork which will be entered into your electronic medical record.  These signatures attest to the fact that that the information above on your After Visit Summary has been reviewed and is understood.  Full responsibility of the confidentiality of this discharge information lies with you and/or your care-partner.

## 2021-04-28 NOTE — Progress Notes (Signed)
Called to room to assist during endoscopic procedure.  Patient ID and intended procedure confirmed with present staff. Received instructions for my participation in the procedure from the performing physician.  

## 2021-04-28 NOTE — Progress Notes (Signed)
Report given to PACU, vss 

## 2021-04-28 NOTE — Progress Notes (Signed)
VS taken by SB

## 2021-04-30 ENCOUNTER — Telehealth: Payer: Self-pay

## 2021-04-30 NOTE — Telephone Encounter (Signed)
  Follow up Call-  Call back number 04/28/2021  Post procedure Call Back phone  # [phone]  Permission to leave phone message Yes  Some recent data might be hidden     Patient questions:  Do you have a fever, pain , or abdominal swelling? No. Pain Score  0 *  Have you tolerated food without any problems? Yes.    Have you been able to return to your normal activities? Yes.    Do you have any questions about your discharge instructions: Diet   No. Medications  No. Follow up visit  No.  Do you have questions or concerns about your Care? No.  Actions: * If pain score is 4 or above: No action needed, pain <4.

## 2021-05-03 ENCOUNTER — Encounter: Payer: Self-pay | Admitting: Gastroenterology

## 2021-05-05 ENCOUNTER — Other Ambulatory Visit: Payer: Self-pay | Admitting: Family Medicine
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
## 2021-05-18 DIAGNOSIS — L718 Other rosacea: Secondary | ICD-10-CM | POA: Diagnosis not present

## 2021-05-18 DIAGNOSIS — L820 Inflamed seborrheic keratosis: Secondary | ICD-10-CM | POA: Diagnosis not present

## 2021-05-18 DIAGNOSIS — L570 Actinic keratosis: Secondary | ICD-10-CM | POA: Diagnosis not present

## 2021-05-18 DIAGNOSIS — X32XXXD Exposure to sunlight, subsequent encounter: Secondary | ICD-10-CM | POA: Diagnosis not present

## 2021-05-24 ENCOUNTER — Telehealth: Payer: Self-pay

## 2021-05-24 NOTE — Chronic Care Management (AMB) (Signed)
    Chronic Care Management Pharmacy Assistant   Name: [name]  MRN: [id number] DOB: [birth year]   Reason for Encounter: Disease State - General Adherence Call    Recent office visits:  04/20/2021 [facility] PCP Marin Olp, MD; Atenol 12.5 mg daily, Irbesartan-hctz 150-12.5 mg takes one tablet- after visit on 10/19/20- tried to take one of the two pills and his BP increased, now his is back to taking 2 pills/day  Recent consult visits:  04/20/2021 [place], Max T, DPM; no medication changes indicated.  Hospital visits:  None in previous 6 months  Medications: Outpatient Encounter Medications as of 05/24/2021  Medication Sig   atenolol (TENORMIN) 25 MG tablet TAKE (1/2) TABLET BY MOUTH ONCE DAILY.   atorvastatin (LIPITOR) 10 MG tablet TAKE 1 TABLET EVERY DAY   clopidogrel (PLAVIX) 75 MG tablet TAKE 1 TABLET EVERY DAY   doxycycline (VIBRAMYCIN) 50 MG capsule Take 50 mg by mouth daily.   famotidine (PEPCID) 20 MG tablet Take 20 mg by mouth daily.   fluticasone (CUTIVATE) 0.05 % cream Apply topically 2 (two) times daily as needed.   fluticasone (FLONASE) 50 MCG/ACT nasal spray INHALE 1 SPRAY INTO EACH NOSTRIL ONCE DAILY.   irbesartan-hydrochlorothiazide (AVALIDE) 150-12.5 MG tablet TAKE 2 TABLETS EVERY DAY   mercaptopurine (PURINETHOL) 50 MG tablet TAKE 1 AND 1/2 TABLETS BY MOUTH DAILY. GIVE ON AN EMPTY STOMACH 1 HOUR BEFORE OR 2 HOURS AFTER A MEAL.   nitroGLYCERIN (NITROSTAT) 0.4 MG SL tablet Place 1 tablet (0.4 mg total) under the tongue every 5 (five) minutes as needed for chest pain.   potassium chloride SA (KLOR-CON) 20 MEQ tablet Take 1 tablet as needed daily as instructed by physician   tamsulosin (FLOMAX) 0.4 MG CAPS capsule Take 0.4 mg by mouth daily.    No facility-administered encounter medications on file as of 05/24/2021.    Have you had any problems recently with your health? Patient states he has had problems recently with his right foot. He states he  has plantar fascitis in his right foot. He states he had some injections within the last month from his podiatrist.  How has your blood pressure readings been at home? 136/82  Have you had any problems with your pharmacy? Patient states he has not had any problems recently with his pharmacy.  What issues or side effects are you having with your medications? Patient states he is not currently having any issues or side effects from any of his medications.  What would you like me to pass along to Madelin Rear, CPP for him to help you with?  Patient states he does not have anything to pass along at this time.  What can we do to take care of you better? Patient states he is doing well at this time.   Future Appointments  Date Time Provider [place]  08/05/2021  1:45 PM [facility] HEALTH COACH [facility]  08/16/2021  1:30 PM [facility] CCM PHARMACIST [facility]  10/25/2021 10:40 AM Marin Olp, MD [facility]     Star Rating Drugs: Atorvastatin LF 01/22/2021 90 DS Irbesartan-HCTZ 150-12.5 mg LF 02/24/2021 90 DS  April D Calhoun, [place] Pharmacist Assistant [phone]

## 2021-06-11 ENCOUNTER — Telehealth: Payer: Self-pay | Admitting: Pharmacist

## 2021-06-11 NOTE — Chronic Care Management (AMB) (Signed)
    Chronic Care Management Pharmacy Assistant   Name: [name]  MRN: [id number] DOB: 13-Oct-1946  Reason for Encounter: Chart Review    Recent office visits:  None  Recent consult visits:  None  Hospital visits:  None in previous 6 months  Medications: Outpatient Encounter Medications as of 06/11/2021  Medication Sig   atenolol (TENORMIN) 25 MG tablet TAKE (1/2) TABLET BY MOUTH ONCE DAILY.   atorvastatin (LIPITOR) 10 MG tablet TAKE 1 TABLET EVERY DAY   clopidogrel (PLAVIX) 75 MG tablet TAKE 1 TABLET EVERY DAY   doxycycline (VIBRAMYCIN) 50 MG capsule Take 50 mg by mouth daily.   famotidine (PEPCID) 20 MG tablet Take 20 mg by mouth daily.   fluticasone (CUTIVATE) 0.05 % cream Apply topically 2 (two) times daily as needed.   fluticasone (FLONASE) 50 MCG/ACT nasal spray INHALE 1 SPRAY INTO EACH NOSTRIL ONCE DAILY.   irbesartan-hydrochlorothiazide (AVALIDE) 150-12.5 MG tablet TAKE 2 TABLETS EVERY DAY   mercaptopurine (PURINETHOL) 50 MG tablet TAKE 1 AND 1/2 TABLETS BY MOUTH DAILY. GIVE ON AN EMPTY STOMACH 1 HOUR BEFORE OR 2 HOURS AFTER A MEAL.   nitroGLYCERIN (NITROSTAT) 0.4 MG SL tablet Place 1 tablet (0.4 mg total) under the tongue every 5 (five) minutes as needed for chest pain.   potassium chloride SA (KLOR-CON) 20 MEQ tablet Take 1 tablet as needed daily as instructed by physician   tamsulosin (FLOMAX) 0.4 MG CAPS capsule Take 0.4 mg by mouth daily.    No facility-administered encounter medications on file as of 06/11/2021.    Reviewed chart for medication changes.  No OVs, Consults, or hospital visits since last care coordination call/Pharmacist visit.  No medication changes indicated.  Patient has a follow up appointment scheduled with clinical pharmacist.  No gaps in adherence identified.  Future Appointments  Date Time Provider [place]  08/05/2021  1:45 PM [facility] HEALTH COACH [facility]  08/16/2021  1:30 PM [facility] CCM PHARMACIST [facility]   10/25/2021 10:40 AM Marin Olp, MD [facility]     April D Calhoun, [place] Pharmacist Assistant [phone]

## 2021-06-22 ENCOUNTER — Other Ambulatory Visit: Payer: Self-pay | Admitting: Family Medicine

## 2021-07-19 ENCOUNTER — Other Ambulatory Visit: Payer: Self-pay | Admitting: Family Medicine

## 2021-07-23 ENCOUNTER — Telehealth: Payer: Self-pay

## 2021-08-05 ENCOUNTER — Ambulatory Visit (INDEPENDENT_AMBULATORY_CARE_PROVIDER_SITE_OTHER): Payer: Medicare HMO

## 2021-08-05 ENCOUNTER — Other Ambulatory Visit: Payer: Self-pay

## 2021-08-05 VITALS — BP 120/80 | Temp 98.2°F | Wt 206.6 lb

## 2021-08-05 DIAGNOSIS — Z0000 Encounter for general adult medical examination without abnormal findings: Secondary | ICD-10-CM | POA: Diagnosis not present

## 2021-08-05 NOTE — Patient Instructions (Signed)
[name] , Thank you for taking time to come for your Medicare Wellness Visit. I appreciate your ongoing commitment to your health goals. Please review the following plan we discussed and let me know if I can assist you in the future.   Screening recommendations/referrals: Colonoscopy: Done 04/28/21  repeat every 3 years due 04/28/24 Recommended yearly ophthalmology/optometry visit for glaucoma screening and checkup Recommended yearly dental visit for hygiene and checkup  Vaccinations: Influenza vaccine: Due Pneumococcal vaccine: Completed  Tdap vaccine: Due and discussed Shingles vaccine: Completed 09/24/18    Covid-19: Completed 2/12, 3/12, & 09/28/20  Advanced directives: Please bring a copy of your health care power of attorney and living will to the office at your convenience.  Conditions/risks identified: Lose weight   Next appointment: Follow up in one year for your annual wellness visit.   Preventive Care 74 Years and Older, Male Preventive care refers to lifestyle choices and visits with your health care provider that can promote health and wellness. What does preventive care include? A yearly physical exam. This is also called an annual well check. Dental exams once or twice a year. Routine eye exams. Ask your health care provider how often you should have your eyes checked. Personal lifestyle choices, including: Daily care of your teeth and gums. Regular physical activity. Eating a healthy diet. Avoiding tobacco and drug use. Limiting alcohol use. Practicing safe sex. Taking low doses of aspirin every day. Taking vitamin and mineral supplements as recommended by your health care provider. What happens during an annual well check? The services and screenings done by your health care provider during your annual well check will depend on your age, overall health, lifestyle risk factors, and family history of disease. Counseling  Your health care provider may ask you  questions about your: Alcohol use. Tobacco use. Drug use. Emotional well-being. Home and relationship well-being. Sexual activity. Eating habits. History of falls. Memory and ability to understand (cognition). Work and work Statistician. Screening  You may have the following tests or measurements: Height, weight, and BMI. Blood pressure. Lipid and cholesterol levels. These may be checked every 5 years, or more frequently if you are over 34 years old. Skin check. Lung cancer screening. You may have this screening every year starting at age 64 if you have a 30-pack-year history of smoking and currently smoke or have quit within the past 15 years. Fecal occult blood test (FOBT) of the stool. You may have this test every year starting at age 51. Flexible sigmoidoscopy or colonoscopy. You may have a sigmoidoscopy every 5 years or a colonoscopy every 10 years starting at age 39. Prostate cancer screening. Recommendations will vary depending on your family history and other risks. Hepatitis C blood test. Hepatitis B blood test. Sexually transmitted disease (STD) testing. Diabetes screening. This is done by checking your blood sugar (glucose) after you have not eaten for a while (fasting). You may have this done every 1-3 years. Abdominal aortic aneurysm (AAA) screening. You may need this if you are a current or former smoker. Osteoporosis. You may be screened starting at age 77 if you are at high risk. Talk with your health care provider about your test results, treatment options, and if necessary, the need for more tests. Vaccines  Your health care provider may recommend certain vaccines, such as: Influenza vaccine. This is recommended every year. Tetanus, diphtheria, and acellular pertussis (Tdap, Td) vaccine. You may need a Td booster every 10 years. Zoster vaccine. You may need this  after age 6. Pneumococcal 13-valent conjugate (PCV13) vaccine. One dose is recommended after age  61. Pneumococcal polysaccharide (PPSV23) vaccine. One dose is recommended after age 43. Talk to your health care provider about which screenings and vaccines you need and how often you need them. This information is not intended to replace advice given to you by your health care provider. Make sure you discuss any questions you have with your health care provider. Document Released: 11/27/2015 Document Revised: 07/20/2016 Document Reviewed: 09/01/2015 Elsevier Interactive Patient Education  2017 [place] Prevention in the Home Falls can cause injuries. They can happen to people of all ages. There are many things you can do to make your home safe and to help prevent falls. What can I do on the outside of my home? Regularly fix the edges of walkways and driveways and fix any cracks. Remove anything that might make you trip as you walk through a door, such as a raised step or threshold. Trim any bushes or trees on the path to your home. Use bright outdoor lighting. Clear any walking paths of anything that might make someone trip, such as rocks or tools. Regularly check to see if handrails are loose or broken. Make sure that both sides of any steps have handrails. Any raised decks and porches should have guardrails on the edges. Have any leaves, snow, or ice cleared regularly. Use sand or salt on walking paths during winter. Clean up any spills in your garage right away. This includes oil or grease spills. What can I do in the bathroom? Use night lights. Install grab bars by the toilet and in the tub and shower. Do not use towel bars as grab bars. Use non-skid mats or decals in the tub or shower. If you need to sit down in the shower, use a plastic, non-slip stool. Keep the floor dry. Clean up any water that spills on the floor as soon as it happens. Remove soap buildup in the tub or shower regularly. Attach bath mats securely with double-sided non-slip rug tape. Do not have throw  rugs and other things on the floor that can make you trip. What can I do in the bedroom? Use night lights. Make sure that you have a light by your bed that is easy to reach. Do not use any sheets or blankets that are too big for your bed. They should not hang down onto the floor. Have a firm chair that has side arms. You can use this for support while you get dressed. Do not have throw rugs and other things on the floor that can make you trip. What can I do in the kitchen? Clean up any spills right away. Avoid walking on wet floors. Keep items that you use a lot in easy-to-reach places. If you need to reach something above you, use a strong step stool that has a grab bar. Keep electrical cords out of the way. Do not use floor polish or wax that makes floors slippery. If you must use wax, use non-skid floor wax. Do not have throw rugs and other things on the floor that can make you trip. What can I do with my stairs? Do not leave any items on the stairs. Make sure that there are handrails on both sides of the stairs and use them. Fix handrails that are broken or loose. Make sure that handrails are as long as the stairways. Check any carpeting to make sure that it is firmly attached to the  stairs. Fix any carpet that is loose or worn. Avoid having throw rugs at the top or bottom of the stairs. If you do have throw rugs, attach them to the floor with carpet tape. Make sure that you have a light switch at the top of the stairs and the bottom of the stairs. If you do not have them, ask someone to add them for you. What else can I do to help prevent falls? Wear shoes that: Do not have high heels. Have rubber bottoms. Are comfortable and fit you well. Are closed at the toe. Do not wear sandals. If you use a stepladder: Make sure that it is fully opened. Do not climb a closed stepladder. Make sure that both sides of the stepladder are locked into place. Ask someone to hold it for you, if  possible. Clearly mark and make sure that you can see: Any grab bars or handrails. First and last steps. Where the edge of each step is. Use tools that help you move around (mobility aids) if they are needed. These include: Canes. Walkers. Scooters. Crutches. Turn on the lights when you go into a dark area. Replace any light bulbs as soon as they burn out. Set up your furniture so you have a clear path. Avoid moving your furniture around. If any of your floors are uneven, fix them. If there are any pets around you, be aware of where they are. Review your medicines with your doctor. Some medicines can make you feel dizzy. This can increase your chance of falling. Ask your doctor what other things that you can do to help prevent falls. This information is not intended to replace advice given to you by your health care provider. Make sure you discuss any questions you have with your health care provider. Document Released: 08/27/2009 Document Revised: 04/07/2016 Document Reviewed: 12/05/2014 Elsevier Interactive Patient Education  2017 [employer].

## 2021-08-05 NOTE — Progress Notes (Addendum)
Subjective:   [name] is a 75 y.o. male who presents for Medicare Annual/Subsequent preventive examination.  Review of Systems     Cardiac Risk Factors include: advanced age (>17mn, >>18women);hypertension;dyslipidemia;male gender;obesity (BMI >30kg/m2)     Objective:    Today's Vitals   08/05/21 1343  BP: 120/80  Temp: 98.2 F (36.8 C)  SpO2: 96%  Weight: 206 lb 9.6 oz (93.7 kg)   Body mass index is 31.41 kg/m.  Advanced Directives 08/05/2021 07/30/2020 07/03/2019 03/07/2018 03/01/2018 10/18/2016 01/15/2015  Does Patient Have a Medical Advance Directive? Yes No No No No No No  Does patient want to make changes to medical advance directive? Yes (MAU/Ambulatory/Procedural Areas - Information given) - - - - - -  Would patient like information on creating a medical advance directive? - - Yes (MAU/Ambulatory/Procedural Areas - Information given) No - Patient declined - - No - patient declined information    Current Medications (verified) Outpatient Encounter Medications as of 08/05/2021  Medication Sig   atenolol (TENORMIN) 25 MG tablet TAKE (1/2) TABLET BY MOUTH ONCE DAILY.   atorvastatin (LIPITOR) 10 MG tablet TAKE 1 TABLET EVERY DAY   clopidogrel (PLAVIX) 75 MG tablet TAKE 1 TABLET EVERY DAY   famotidine (PEPCID) 20 MG tablet Take 20 mg by mouth daily.   fluticasone (FLONASE) 50 MCG/ACT nasal spray INHALE 1 SPRAY INTO EACH NOSTRIL ONCE DAILY.   irbesartan-hydrochlorothiazide (AVALIDE) 150-12.5 MG tablet TAKE 2 TABLETS EVERY DAY   mercaptopurine (PURINETHOL) 50 MG tablet TAKE 1 AND 1/2 TABLETS BY MOUTH DAILY. GIVE ON AN EMPTY STOMACH 1 HOUR BEFORE OR 2 HOURS AFTER A MEAL.   nitroGLYCERIN (NITROSTAT) 0.4 MG SL tablet PLACE 1 TAB UNDER TONGUE EVERY 5 MIN IF NEEDED FOR CHEST PAIN. MAY USE 3 TIMES.NO RELIEF CALL 911.   potassium chloride SA (KLOR-CON) 20 MEQ tablet Take 1 tablet as needed daily as instructed by physician   tamsulosin (FLOMAX) 0.4 MG CAPS capsule Take 0.4 mg by  mouth daily.    fluticasone (CUTIVATE) 0.05 % cream Apply topically 2 (two) times daily as needed. (Patient not taking: Reported on 08/05/2021)   [DISCONTINUED] doxycycline (VIBRAMYCIN) 50 MG capsule Take 50 mg by mouth daily. (Patient not taking: Reported on 08/05/2021)   No facility-administered encounter medications on file as of 08/05/2021.    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   UTI (urinary tract infection) 2019   Past Surgical History:  Procedure Laterality Date   AOgemawCUFF REPAIR  2009   right   TRIGGER FINGER RELEASE     right   Family History  Problem Relation Age of Onset   Stroke Mother    COPD Father    Heart disease Sister        valve replacement   CAD Brother    Heart attack Brother  Colon cancer Neg Hx    Esophageal cancer Neg Hx    Liver disease Neg Hx    Diabetes Neg Hx    Rectal cancer Neg Hx    Stomach cancer Neg Hx    Social History   Socioeconomic History   Marital status: Widowed    Spouse name: Not on file   Number of children: 3   Years of education: Not on file   Highest education level: Not on file  Occupational History   Occupation: apartment maintenance    Employer: [employer]    Comment: retired  Tobacco Use   Smoking status: Former    Packs/day: 1.00    Years: 3.00    Pack years: 3.00    Types: Cigarettes    Quit date: 11/14/1966    Years  since quitting: 54.7   Smokeless tobacco: Former    Types: Chew   Tobacco comments:    quit 50 years ago  Vaping Use   Vaping Use: Never used  Substance and Sexual Activity   Alcohol use: No    Alcohol/week: 0.0 standard drinks   Drug use: No   Sexual activity: Not on file  Other Topics Concern   Not on file  Social History Narrative   Widower January 2019.. 3 children- son is a patient here. 4 grandkids.       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 to work on so a little at a time, mow the kids yards, enjoys yardwork      Pt does not get regular exercise--daily caffeine use-5 cups daily   Social Determinants of Radio broadcast assistant Strain: Low Risk    Difficulty of Paying Living Expenses: Not hard at all  Food Insecurity: No Food Insecurity   Worried About Charity fundraiser in the Last Year: Never true   Arboriculturist in the Last Year: Never true  Transportation Needs: No Transportation Needs   Lack of Transportation (Medical): No   Lack of Transportation (Non-Medical): No  Physical Activity: Insufficiently Active   Days of Exercise per Week: 2 days   Minutes of Exercise per Session: 30 min  Stress: No Stress Concern Present   Feeling of Stress : Not at all  Social Connections: Moderately Isolated   Frequency of Communication with Friends and Family: More than three times a week   Frequency of Social Gatherings with Friends and Family: Once a week   Attends Religious Services: 1 to 4 times per year   Active Member of [employer] or Organizations: No   Attends Archivist Meetings: Never   Marital Status: Widowed    Tobacco Counseling Counseling given: Not Answered Tobacco comments: quit 50 years ago   Clinical Intake:  Pre-visit preparation completed: Yes  Pain : No/denies pain     BMI - recorded: 31.41 Nutritional Status: BMI > 30  Obese Nutritional Risks: None Diabetes: No  How often  do you need to have someone help you when you read instructions, pamphlets, or other written materials from your doctor or pharmacy?: 1 - Never  Diabetic?No  Interpreter Needed?: No  Information entered by :: Charlott Rakes, LPN   Activities of Daily Living In your present state of health, do you have any difficulty performing the following activities: 08/05/2021  Hearing? Y  Comment HOH especially in crowds  Vision? N  Difficulty concentrating or making decisions? N  Walking  or climbing stairs? N  Dressing or bathing? N  Doing errands, shopping? N  Preparing Food and eating ? N  Using the Toilet? N  In the past six months, have you accidently leaked urine? N  Do you have problems with loss of bowel control? N  Managing your Medications? N  Managing your Finances? N  Housekeeping or managing your Housekeeping? N  Some recent data might be hidden    Patient Care Team: Marin Olp, MD as PCP - General (Family Medicine) Fay Records, MD as PCP - Cardiology (Cardiology) Madelin Rear, [facility] as Pharmacist (Pharmacist)  Indicate any recent Medical Services you may have received from other than [facility] providers in the past year (date may be approximate).     Assessment:   This is a routine wellness examination for [place].  Hearing/Vision screen Hearing Screening - Comments:: HOH Vision Screening - Comments:: Pt follows up with [facility] for annual eye exams   Dietary issues and exercise activities discussed: Current Exercise Habits: Home exercise routine, Type of exercise: walking, Time (Minutes): 30, Frequency (Times/Week): 2, Weekly Exercise (Minutes/Week): 60   Goals Addressed             This Visit's Progress    Patient Stated       Lose weight        Depression Screen PHQ 2/9 Scores 08/05/2021 04/20/2021 07/30/2020 04/16/2020 10/16/2019 10/24/2018 10/10/2018  PHQ - 2 Score 1 0 0 0 0 0 1  PHQ- 9 Score - 1 - 0 - - -    Fall Risk Fall Risk  08/05/2021 04/20/2021  07/30/2020 10/16/2019 07/03/2019  Falls in the past year? 0 0 0 0 0  Number falls in past yr: 0 0 0 0 0  Injury with Fall? 0 0 0 0 0  Risk for fall due to : Impaired vision - Impaired balance/gait;Impaired mobility;Impaired vision - -  Follow up Falls prevention discussed - Falls prevention discussed - Education provided    FALL RISK PREVENTION PERTAINING TO THE HOME:  Any stairs in or around the home? No  If so, are there any without handrails? No  Home free of loose throw rugs in walkways, pet beds, electrical cords, etc? Yes  Adequate lighting in your home to reduce risk of falls? Yes   ASSISTIVE DEVICES UTILIZED TO PREVENT FALLS:  Life alert? No  Use of a cane, walker or w/c? No  Grab bars in the bathroom? No  Shower chair or bench in shower? No  Elevated toilet seat or a handicapped toilet? No   TIMED UP AND GO:  Was the test performed? Yes .  Length of time to ambulate 10 feet: 10 sec.   Gait steady and fast with assistive device  Cognitive Function: MMSE - Mini Mental State Exam 03/01/2018 10/18/2016  Not completed: (No Data) -  Orientation to time - 5  Orientation to Place - 5  Registration - 3  Attention/ Calculation - 5  Recall - 3  Language- name 2 objects - 2  Language- repeat - 1  Language- follow 3 step command - 3  Language- read & follow direction - 1  Write a sentence - 1  Copy design - 1  Total score - 30     6CIT Screen 08/05/2021 07/30/2020  What Year? 0 points 0 points  What month? 0 points 0 points  What time? 0 points -  Count back from 20 0 points 0 points  Months in reverse 0  points 0 points  Repeat phrase 0 points 0 points  Total Score 0 -    Immunizations Immunization History  Administered Date(s) Administered   Fluad Quad(high Dose 65+) 08/31/2020   Influenza Split 08/19/2011, 08/17/2012   Influenza Whole 10/23/2007, 08/14/2009, 08/13/2010, 08/23/2019   Influenza, High Dose Seasonal PF 09/13/2016, 09/01/2017, 08/22/2018, 08/21/2019    Influenza,inj,Quad PF,6+ Mos 08/16/2013   Influenza-Unspecified 08/28/2014, 09/07/2015, 09/01/2017, 09/13/2018   Moderna Sars-Covid-2 Vaccination 12/27/2019, 01/24/2020, 09/28/2020   Pneumococcal Conjugate-13 06/08/2015   Pneumococcal Polysaccharide-23 04/25/2014   Td 11/14/2000, 09/21/2010   Zoster Recombinat (Shingrix) 09/24/2018    TDAP status: Due, Education has been provided regarding the importance of this vaccine. Advised may receive this vaccine at local pharmacy or Health Dept. Aware to provide a copy of the vaccination record if obtained from local pharmacy or Health Dept. Verbalized acceptance and understanding.  Flu Vaccine status: Up to date  Pneumococcal vaccine status: Up to date  Covid-19 vaccine status: Completed vaccines  Qualifies for Shingles Vaccine? Yes   Zostavax completed Yes   Shingrix Completed?: Yes  Screening Tests Health Maintenance  Topic Date Due   Zoster Vaccines- Shingrix (2 of 2) 11/19/2018   TETANUS/TDAP  09/21/2020   COVID-19 Vaccine (4 - Booster for Moderna series) 12/21/2020   INFLUENZA VACCINE  06/14/2021   COLONOSCOPY (Pts 45-48yr Insurance coverage will need to be confirmed)  04/28/2024   Hepatitis C Screening  Completed   HPV VACCINES  Aged Out    Health Maintenance  Health Maintenance Due  Topic Date Due   Zoster Vaccines- Shingrix (2 of 2) 11/19/2018   TETANUS/TDAP  09/21/2020   COVID-19 Vaccine (4 - Booster for Moderna series) 12/21/2020   INFLUENZA VACCINE  06/14/2021    Colorectal cancer screening: Type of screening: Colonoscopy. Completed 04/28/21. Repeat every 3 years  Additional Screening:  Hepatitis C Screening:  Completed 09/13/16  Vision Screening: Recommended annual ophthalmology exams for early detection of glaucoma and other disorders of the eye. Is the patient up to date with their annual eye exam?  Yes  Who is the provider or what is the name of the office in which the patient attends annual eye exams?  [facility] If pt is not established with a provider, would they like to be referred to a provider to establish care? No .   Dental Screening: Recommended annual dental exams for proper oral hygiene  Community Resource Referral / Chronic Care Management: CRR required this visit?  No   CCM required this visit?  No      Plan:     I have personally reviewed and noted the following in the patient's chart:   Medical and social history Use of alcohol, tobacco or illicit drugs  Current medications and supplements including opioid prescriptions. Patient is not currently taking opioid prescriptions. Functional ability and status Nutritional status Physical activity Advanced directives List of other physicians Hospitalizations, surgeries, and ER visits in previous 12 months Vitals Screenings to include cognitive, depression, and falls Referrals and appointments  In addition, I have reviewed and discussed with patient certain preventive protocols, quality metrics, and best practice recommendations. A written personalized care plan for preventive services as well as general preventive health recommendations were provided to patient.     TWillette Brace LPN   [id number]  Nurse Notes: None

## 2021-08-09 ENCOUNTER — Other Ambulatory Visit: Payer: Self-pay

## 2021-08-09 ENCOUNTER — Telehealth: Payer: Self-pay | Admitting: Pharmacist

## 2021-08-09 MED ORDER — ATENOLOL 25 MG PO TABS: ORAL_TABLET | ORAL | 3 refills | Status: DC

## 2021-08-09 NOTE — Chronic Care Management (AMB) (Signed)
    Chronic Care Management Pharmacy Assistant   Name: [name]  MRN: [id number] DOB: Mar 19, 1946   Reason for Encounter: General Adherence Call    Recent office visits:  None  Recent consult visits:  None  Hospital visits:  None  Medications: Outpatient Encounter Medications as of 08/09/2021  Medication Sig   atenolol (TENORMIN) 25 MG tablet TAKE (1/2) TABLET BY MOUTH ONCE DAILY.   atorvastatin (LIPITOR) 10 MG tablet TAKE 1 TABLET EVERY DAY   clopidogrel (PLAVIX) 75 MG tablet TAKE 1 TABLET EVERY DAY   famotidine (PEPCID) 20 MG tablet Take 20 mg by mouth daily.   fluticasone (CUTIVATE) 0.05 % cream Apply topically 2 (two) times daily as needed. (Patient not taking: Reported on 08/05/2021)   fluticasone (FLONASE) 50 MCG/ACT nasal spray INHALE 1 SPRAY INTO EACH NOSTRIL ONCE DAILY.   irbesartan-hydrochlorothiazide (AVALIDE) 150-12.5 MG tablet TAKE 2 TABLETS EVERY DAY   mercaptopurine (PURINETHOL) 50 MG tablet TAKE 1 AND 1/2 TABLETS BY MOUTH DAILY. GIVE ON AN EMPTY STOMACH 1 HOUR BEFORE OR 2 HOURS AFTER A MEAL.   nitroGLYCERIN (NITROSTAT) 0.4 MG SL tablet PLACE 1 TAB UNDER TONGUE EVERY 5 MIN IF NEEDED FOR CHEST PAIN. MAY USE 3 TIMES.NO RELIEF CALL 911.   potassium chloride SA (KLOR-CON) 20 MEQ tablet Take 1 tablet as needed daily as instructed by physician   tamsulosin (FLOMAX) 0.4 MG CAPS capsule Take 0.4 mg by mouth daily.    No facility-administered encounter medications on file as of 08/09/2021.   Patient Questions: Have you had any problems recently with your health? Patient states he has not had any problems recently with his health.  How has your blood pressure readings been at home? 130's/80's  Have you had any problems with your pharmacy? Patient states he has not had any problems recently with his pharmacy.  What issues or side effects are you having with your medications? Patient states he is not currently having any issues or side effects with any of his  medications.  What would you like me to pass along to Leata Mouse, CPP for him to help you with?  Patient states he does not have anything to pass along at this time.  What can we do to take care of you better? Patient did not have any suggestions. He states he is happy with his current level of care.   Care Gaps: Medicare Annual Wellness: Completed on 08/05/2021 Hemoglobin A1C: 6.1% on 04/20/2021 Colonoscopy: Next due on 04/28/2024  Future Appointments  Date Time Provider [place]  08/16/2021  1:30 PM [facility] CCM PHARMACIST [facility]  10/25/2021 10:40 AM Marin Olp, MD [facility]  08/15/2022  1:45 PM [facility] HEALTH COACH [facility]     Star Rating Drugs: Atorvastatin last filled 04/19/2021 90 DS Irbesartan-HCTZ last filled 05/11/2021 90 DS  April D Calhoun, [place] Pharmacist Assistant [phone]

## 2021-08-16 ENCOUNTER — Telehealth: Payer: Medicare HMO

## 2021-08-18 ENCOUNTER — Other Ambulatory Visit: Payer: Self-pay | Admitting: Gastroenterology

## 2021-09-14 NOTE — Progress Notes (Deleted)
Chronic Care Management Pharmacy Note  09/14/2021 Name:  [name] MRN:  [id number] DOB:  30-Jul-1946  Summary: ***  Recommendations/Changes made from today's visit: ***  Plan: ***   Subjective: [name] is an 75 y.o. year old male who is a primary patient of Hunter, Brayton Mars, MD.  The CCM team was consulted for assistance with disease management and care coordination needs.    Engaged with patient by telephone for follow up visit in response to provider referral for pharmacy case management and/or care coordination services.   Consent to Services:  The patient was given the following information about Chronic Care Management services today, agreed to services, and gave verbal consent: 1. CCM service includes personalized support from designated clinical staff supervised by the primary care provider, including individualized plan of care and coordination with other care providers 2. 24/7 contact phone numbers for assistance for urgent and routine care needs. 3. Service will only be billed when office clinical staff spend 20 minutes or more in a month to coordinate care. 4. Only one practitioner may furnish and bill the service in a calendar month. 5.The patient may stop CCM services at any time (effective at the end of the month) by phone call to the office staff. 6. The patient will be responsible for cost sharing (co-pay) of up to 20% of the service fee (after annual deductible is met). Patient agreed to services and consent obtained.  Patient Care Team: Marin Olp, MD as PCP - General (Family Medicine) [name] Records, MD as PCP - Cardiology (Cardiology) [name], [facility] as Pharmacist (Pharmacist)  Recent office visits:  None   Recent consult visits:  None   Hospital visits:  None   Objective:  Lab Results  Component Value Date   CREATININE 1.43 04/20/2021   BUN 18 04/20/2021   GFR 48.46 (L) 04/20/2021   GFRNONAA 58 (L) 10/19/2020   GFRAA 68  10/19/2020   NA 138 04/20/2021   K 3.3 (L) 04/20/2021   CALCIUM 9.7 04/20/2021   CO2 32 04/20/2021   GLUCOSE 89 04/20/2021    Lab Results  Component Value Date/Time   HGBA1C 6.1 04/20/2021 10:14 AM   HGBA1C 5.5 10/19/2020 11:41 AM   GFR 48.46 (L) 04/20/2021 10:14 AM   GFR 53.64 (L) 04/16/2020 01:51 PM    Last diabetic Eye exam: No results found for: HMDIABEYEEXA  Last diabetic Foot exam: No results found for: HMDIABFOOTEX   Lab Results  Component Value Date   CHOL 119 10/19/2020   HDL 35 (L) 10/19/2020   LDLCALC 66 10/19/2020   TRIG 98 10/19/2020   CHOLHDL 3.4 10/19/2020    Hepatic Function Latest Ref Rng & Units 04/20/2021 10/19/2020 04/16/2020  Total Protein 6.0 - 8.3 g/dL 6.5 6.7 6.7  Albumin 3.5 - 5.2 g/dL 3.7 - 3.9  AST 0 - 37 U/L _0 ALT 0 - 53 U/L _1 Alk Phosphatase 39 - 117 U/L 59 - 57  Total Bilirubin 0.2 - 1.2 mg/dL 0.7 1.1 0.7  Bilirubin, Direct 0.0 - 0.3 mg/dL - - -    Lab Results  Component Value Date/Time   TSH 0.95 03/20/2017 11:41 AM   TSH 1.31 06/08/2015 08:57 AM    CBC Latest Ref Rng & Units 04/20/2021 10/19/2020 04/16/2020  WBC 4.0 - 10.5 K/uL 7.6 6.6 7.1  Hemoglobin 13.0 - 17.0 g/dL 15.4 16.0 15.4  Hematocrit 39.0 - 52.0 % 45.3 47.6 44.5  Platelets  150.0 - 400.0 K/uL 206.0 236 214.0    No results found for: VD25OH  Clinical ASCVD: {YES/NO:21197} The ASCVD Risk score (Arnett DK, et al., 2019) failed to calculate for the following reasons:   The patient has a prior MI or stroke diagnosis    Depression screen [facility] 2/9 08/05/2021 04/20/2021 07/30/2020  Decreased Interest 0 0 0  Down, Depressed, Hopeless 1 0 0  PHQ - 2 Score 1 0 0  Altered sleeping - 1 -  Tired, decreased energy - 0 -  Change in appetite - 0 -  Feeling bad or failure about yourself  - 0 -  Trouble concentrating - 0 -  Moving slowly or fidgety/restless - 0 -  Suicidal thoughts - 0 -  PHQ-9 Score - 1 -  Difficult doing work/chores - Not difficult at all -  Some recent  data might be hidden     ***Other: (CHADS2VASc if Afib, MMRC or CAT for COPD, ACT, DEXA)  Social History   Tobacco Use  Smoking Status Former   Packs/day: 1.00   Years: 3.00   Pack years: 3.00   Types: Cigarettes   Quit date: 11/14/1966   Years since quitting: 54.8  Smokeless Tobacco Former   Types: Chew  Tobacco Comments   quit 50 years ago   BP Readings from Last 3 Encounters:  08/05/21 120/80  04/28/21 118/75  04/20/21 114/66   Pulse Readings from Last 3 Encounters:  04/28/21 (!) 58  04/20/21 (!) 55  02/11/21 (!) 59   Wt Readings from Last 3 Encounters:  08/05/21 206 lb 9.6 oz (93.7 kg)  04/28/21 203 lb (92.1 kg)  04/20/21 203 lb 9.6 oz (92.4 kg)   BMI Readings from Last 3 Encounters:  08/05/21 31.41 kg/m  04/28/21 30.87 kg/m  04/20/21 30.96 kg/m    Assessment/Interventions: Review of patient past medical history, allergies, medications, health status, including review of consultants reports, laboratory and other test data, was performed as part of comprehensive evaluation and provision of chronic care management services.   SDOH:  (Social Determinants of Health) assessments and interventions performed: {yes/no:20286}  SDOH Screenings   Alcohol Screen: Not on file  Depression (PHQ2-9): Low Risk    PHQ-2 Score: 1  Financial Resource Strain: Low Risk    Difficulty of Paying Living Expenses: Not hard at all  Food Insecurity: No Food Insecurity   Worried About Charity fundraiser in the Last Year: Never true   Ran Out of Food in the Last Year: Never true  Housing: Low Risk    Last Housing Risk Score: 0  Physical Activity: Insufficiently Active   Days of Exercise per Week: 2 days   Minutes of Exercise per Session: 30 min  Social Connections: Moderately Isolated   Frequency of Communication with Friends and Family: More than three times a week   Frequency of Social Gatherings with Friends and Family: Once a week   Attends Religious Services: 1 to 4 times  per year   Active Member of [employer] or Organizations: No   Attends Archivist Meetings: Never   Marital Status: Widowed  Stress: No Stress Concern Present   Feeling of Stress : Not at all  Tobacco Use: Medium Risk   Smoking Tobacco Use: Former   Smokeless Tobacco Use: Former   Passive Exposure: Not on Pensions consultant Needs: No Transportation Needs   Film/video editor (Medical): No   Lack of Transportation (Non-Medical): No    CCM Care Plan  Allergies  Allergen Reactions   Penicillins Rash    REACTION: rash    Medications Reviewed Today     Reviewed by Willette Brace, LPN (Licensed Practical Nurse) on 08/05/21 at 1350  Med List Status: <None>   Medication Order Taking? Sig Documenting Provider Last Dose Status Informant  atenolol (TENORMIN) 25 MG tablet [id number] Yes TAKE (1/2) TABLET BY MOUTH ONCE DAILY. Marin Olp, MD Taking Active   atorvastatin (LIPITOR) 10 MG tablet [id number] Yes TAKE 1 TABLET EVERY DAY Marin Olp, MD Taking Active   clopidogrel (PLAVIX) 75 MG tablet [id number] Yes TAKE 1 TABLET EVERY DAY Marin Olp, MD Taking Active   famotidine (PEPCID) 20 MG tablet [id number] Yes Take 20 mg by mouth daily. [provider] Taking Active   fluticasone (CUTIVATE) 0.05 % cream [id number] No Apply topically 2 (two) times daily as needed.  Patient not taking: Reported on 08/05/2021   [provider] Not Taking Active   fluticasone (FLONASE) 50 MCG/ACT nasal spray [id number] Yes INHALE 1 SPRAY INTO EACH NOSTRIL ONCE DAILY. Marin Olp, MD Taking Active   irbesartan-hydrochlorothiazide (AVALIDE) 150-12.5 MG tablet [id number] Yes TAKE 2 TABLETS EVERY DAY Marin Olp, MD Taking Active   mercaptopurine (PURINETHOL) 50 MG tablet [id number] Yes TAKE 1 AND 1/2 TABLETS BY MOUTH DAILY. GIVE ON AN EMPTY STOMACH 1 HOUR BEFORE OR 2 HOURS AFTER A MEAL. Ladene Artist, MD Taking Active   nitroGLYCERIN (NITROSTAT) 0.4 MG SL  tablet [id number] Yes PLACE 1 TAB UNDER TONGUE EVERY 5 MIN IF NEEDED FOR CHEST PAIN. MAY USE 3 TIMES.NO RELIEF CALL 911. Marin Olp, MD Taking Active   potassium chloride SA (KLOR-CON) 20 MEQ tablet [id number] Yes Take 1 tablet as needed daily as instructed by physician Marin Olp, MD Taking Active   tamsulosin [facility]) 0.4 MG CAPS capsule [id number] Yes Take 0.4 mg by mouth daily.  [provider] Taking Active             Patient Active Problem List   Diagnosis Date Noted   Aortic atherosclerosis ([place]) 04/20/2021   Actinic keratosis 10/10/2018   Postherpetic neuralgia 03/19/2018   Balanitis 03/06/2017   OA (osteoarthritis) of knee 12/07/2015   Former smoker 06/08/2015   Hyperglycemia 06/08/2015   Edema 02/04/2015   Allergic rhinitis 10/24/2014   CKD (chronic kidney disease), stage III ([place]) 10/24/2014   Obesity (BMI 30-39.9) 04/25/2014   Right cervical radiculopathy 04/12/2013   ULCERATIVE COLITIS-LEFT SIDE 07/03/2008   Sleep apnea 07/02/2008   Low back pain 01/11/2008   GERD 11/07/2007   Trigger finger, acquired 10/23/2007   CAD (coronary artery disease) 06/15/2007   Hyperlipidemia 06/14/2007   Essential hypertension 06/14/2007   History of colonic polyps 06/14/2007    Immunization History  Administered Date(s) Administered   Fluad Quad(high Dose 65+) 08/31/2020   Influenza Split 08/19/2011, 08/17/2012   Influenza Whole 10/23/2007, 08/14/2009, 08/13/2010, 08/23/2019   Influenza, High Dose Seasonal PF 09/13/2016, 09/01/2017, 08/22/2018, 08/21/2019   Influenza,inj,Quad PF,6+ Mos 08/16/2013   Influenza-Unspecified 08/28/2014, 09/07/2015, 09/01/2017, 09/13/2018   Moderna Sars-Covid-2 Vaccination 12/27/2019, 01/24/2020, 09/28/2020   Pneumococcal Conjugate-13 06/08/2015   Pneumococcal Polysaccharide-23 04/25/2014   Td 11/14/2000, 09/21/2010   Zoster Recombinat (Shingrix) 09/24/2018    Conditions to be addressed/monitored:  HTN, CAD, GERD, HLD,  CKD  There are no care plans that you recently modified to display for this patient.    Medication Assistance: {MEDASSISTANCEINFO:25044}  Compliance/Adherence/Medication fill history: Care Gaps: ***  Star-Rating Drugs: ***  Patient's preferred pharmacy is:  [facility], [place] [address] [place] [address] Phone: [phone] Fax: [phone]  [place], [place] [address] Phone: [phone] Fax: [phone]  [facility] [address] [place] - [address] [place] [address] [place] [address] [place] [address] Phone: [phone] Fax: [phone]  Uses pill box? {Yes or If no, why not?:20788} Pt endorses ***% compliance  We discussed: {Pharmacy options:24294} Patient decided to: {US Pharmacy Plan:23885}  Care Plan and Follow Up Patient Decision:  {FOLLOWUP:24991}  Plan: {CM FOLLOW UP EQFD:74451}  ***  Current Barriers:  {pharmacybarriers:24917}  Pharmacist Clinical Goal(s):  Patient will {PHARMACYGOALCHOICES:24921} through collaboration with PharmD and provider.   Interventions: 1:1 collaboration with Marin Olp, MD regarding development and update of comprehensive plan of care as evidenced by provider attestation and co-signature Inter-disciplinary care team collaboration (see longitudinal plan of care) Comprehensive medication review performed; medication list updated in electronic medical record  Hypertension w/ CKD(BP goal {CHL HP UPSTREAM Pharmacist BP ranges:[id number]}) -{US controlled/uncontrolled:25276} -Current treatment: *** -Medications previously tried: ***  -Current home readings: *** -Current dietary habits: *** -Current exercise habits: *** -{ACTIONS;DENIES/REPORTS:21021675::"Denies"} hypotensive/hypertensive symptoms -Educated on {CCM BP Counseling:25124} -Counseled to monitor BP at home ***,  document, and provide log at future appointments -{CCMPHARMDINTERVENTION:25122}  Hyperlipidemia: (LDL goal < ***) -{US controlled/uncontrolled:25276} -Current treatment: *** -Medications previously tried: ***  -Current dietary patterns: *** -Current exercise habits: *** -Educated on {CCM HLD Counseling:25126} -{CCMPHARMDINTERVENTION:25122}  Patient Goals/Self-Care Activities Patient will:  - {pharmacypatientgoals:24919}  Follow Up Plan: {CM FOLLOW UP QUIQ:79987}

## 2021-09-20 ENCOUNTER — Telehealth: Payer: Medicare HMO

## 2021-09-20 ENCOUNTER — Telehealth: Payer: Self-pay | Admitting: Pharmacist

## 2021-09-20 NOTE — Chronic Care Management (AMB) (Signed)
  Care Management   Follow Up Note   09/20/2021 Name: [name] MRN: [id number] DOB: 07/19/1946   Referred by: Marin Olp, MD Reason for referral : No chief complaint on file.   An unsuccessful telephone outreach was attempted today. The patient was referred to the case management team for assistance with care management and care coordination.   Follow Up Plan: If patient returns call to provider office, please advise to call Embedded Care Management Care Guide April Calhoun at [facility], PharmD Clinical Pharmacist [phone]

## 2021-10-03 ENCOUNTER — Other Ambulatory Visit: Payer: Self-pay | Admitting: Family Medicine

## 2021-10-18 ENCOUNTER — Other Ambulatory Visit: Payer: Self-pay | Admitting: Gastroenterology

## 2021-10-22 NOTE — Progress Notes (Signed)
Phone: [phone]   Subjective:  Patient presents today for their annual physical. Chief complaint-noted.   See problem oriented charting- ROS- full  review of systems was completed and negative  except for: hearing loss, sneezing, seasonal allergies  The following were reviewed and entered/updated in epic: Past Medical History:  Diagnosis Date   Adenomatous polyps 03/2004, 11/2012   Arthritis    Bradycardia 2018   Clotting disorder ([place])    on plavix   Coronary atherosclerosis of unspecified type of vessel, native or graft    Esophagitis 1993   GERD (gastroesophageal reflux disease)    Hyperlipidemia    Hypertension    Myocardial infarction ([place]) 1993   Shingles 2019   Sleep apnea    cpap   Stroke ([place]) 02/2014   Trigger finger (acquired)    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   UTI (urinary tract infection) 2019   Patient Active Problem List   Diagnosis Date Noted   ULCERATIVE COLITIS-LEFT SIDE 07/03/2008    Priority: High   CAD (coronary artery disease) 06/15/2007    Priority: High   Hyperglycemia 06/08/2015    Priority: Medium    CKD (chronic kidney disease), stage III ([place]) 10/24/2014    Priority: Medium    Sleep apnea 07/02/2008    Priority: Medium    Hyperlipidemia 06/14/2007    Priority: Medium    Essential hypertension 06/14/2007    Priority: Medium    History of colonic polyps 06/14/2007    Priority: Medium    Postherpetic neuralgia 03/19/2018    Priority: Low   Balanitis 03/06/2017    Priority: Low   OA (osteoarthritis) of knee 12/07/2015    Priority: Low   Former smoker 06/08/2015    Priority: Low   Edema 02/04/2015    Priority: Low   Allergic rhinitis 10/24/2014    Priority: Low   Obesity (BMI 30-39.9) 04/25/2014    Priority: Low   Right cervical radiculopathy 04/12/2013    Priority: Low   Low back pain 01/11/2008    Priority: Low   GERD 11/07/2007    Priority: Low   Trigger finger, acquired 10/23/2007    Priority:  Low   Aortic atherosclerosis ([place]) 04/20/2021   Actinic keratosis 10/10/2018   Past Surgical History:  Procedure Laterality Date   ANGIOPLASTY  1993   CHOLECYSTECTOMY  2002   [place]   ROTATOR CUFF REPAIR  2009   right   TRIGGER FINGER RELEASE     right    Family History  Problem Relation Age of Onset   Stroke Mother    COPD Father    Heart disease Sister        valve replacement   CAD Brother    Heart attack Brother    Colon cancer Neg Hx    Esophageal cancer Neg Hx    Liver disease Neg Hx    Diabetes Neg Hx    Rectal cancer Neg Hx    Stomach cancer Neg Hx     Medications- reviewed and updated Current Outpatient Medications  Medication Sig Dispense Refill   atenolol (TENORMIN) 25 MG tablet TAKE (1/2) TABLET BY MOUTH ONCE DAILY. 45 tablet 3   atorvastatin (LIPITOR) 10 MG tablet TAKE 1 TABLET EVERY DAY 90 tablet 3   clopidogrel (PLAVIX) 75 MG tablet TAKE 1 TABLET EVERY DAY 90 tablet 3   famotidine (PEPCID) 20 MG tablet Take 20 mg by mouth daily.     fluticasone (CUTIVATE) 0.05 % cream  Apply topically 2 (two) times daily as needed.     fluticasone (FLONASE) 50 MCG/ACT nasal spray INHALE 1 SPRAY INTO EACH NOSTRIL ONCE DAILY. 16 g 3   irbesartan-hydrochlorothiazide (AVALIDE) 150-12.5 MG tablet TAKE 2 TABLETS EVERY DAY 180 tablet 3   mercaptopurine (PURINETHOL) 50 MG tablet TAKE 1 AND 1/2 TABLETS BY MOUTH DAILY. GIVE ON AN EMPTY STOMACH 1 HOUR BEFORE OR 2 HOURS AFTER A MEAL. 45 tablet 0   nitroGLYCERIN (NITROSTAT) 0.4 MG SL tablet PLACE 1 TAB UNDER TONGUE EVERY 5 MIN IF NEEDED FOR CHEST PAIN. MAY USE 3 TIMES.NO RELIEF CALL 911. 25 tablet 0   potassium chloride SA (KLOR-CON) 20 MEQ tablet Take 1 tablet as needed daily as instructed by physician 30 tablet 3   tamsulosin (FLOMAX) 0.4 MG CAPS capsule Take 0.4 mg by mouth daily.      No current facility-administered medications for this visit.    Allergies-reviewed and updated Allergies  Allergen Reactions    Penicillins Rash    REACTION: rash    Social History   Social History Narrative   Widower January 2019.. 3 children- son is a patient here. 4 grandkids.       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 Oropharynx normal Neck: no thyromegaly CV: RRR no murmurs rubs or gallops Lungs: CTAB no crackles, wheeze, rhonchi Abdomen: soft/nontender/nondistended/normal bowel sounds. No rebound or guarding.  Ext: no edema Skin: warm, dry Neuro: grossly normal, moves all extremities, PERRLA    Assessment and Plan  75 y.o. male presenting for annual physical.  Health Maintenance counseling: 1. Anticipatory guidance: Patient counseled regarding regular dental exams q6 months, eye exams -yearly usually- has visit today,  avoiding smoking and second hand smoke , limiting alcohol to 2 beverages per day -doesn't drink. No illicit drugs.  2. Risk factor reduction:  Advised patient of need for regular exercise and diet rich and fruits and vegetables to reduce risk of heart attack and stroke.  Exercise- not exercising- would encourage building up to 150 minutes a week- still considering [employer].  Diet/weight management-discussed ways to improve diet. up 6 lbs from last year.  Wt Readings from Last 3 Encounters:  10/25/21 205 lb 1.6 oz (93 kg)  08/05/21 206 lb 9.6 oz (93.7 kg)  04/28/21 203 lb (92.1 kg)  3. Immunizations/screenings/ancillary studies DISCUSSED:  -COVID booster vaccinatin #4- recommended bivalent vaccination -Shingrix vaccination #2- recommended final shot at pharmacy- reports he  read something about medicare covering in office -TDAP vaccination- recommended at pharmacy Immunization History  Administered Date(s) Administered   Fluad Quad(high Dose 65+) 08/31/2020, 09/13/2021   Influenza Split 08/19/2011, 08/17/2012   Influenza Whole 10/23/2007, 08/14/2009, 08/13/2010, 08/23/2019   Influenza, High Dose Seasonal PF 09/13/2016, 09/01/2017, 08/22/2018, 08/21/2019   Influenza,inj,Quad PF,6+ Mos 08/16/2013   Influenza-Unspecified 08/28/2014, 09/07/2015, 09/01/2017, 09/13/2018   Moderna Sars-Covid-2 Vaccination 12/27/2019, 01/24/2020, 09/28/2020   Pneumococcal Conjugate-13 06/08/2015   Pneumococcal Polysaccharide-23 04/25/2014   Td 11/14/2000, 09/21/2010  Zoster Recombinat (Shingrix) 09/24/2018   4. Prostate cancer screening- on flomax. Follows with Dr. Gilford Rile. Last PSA came down.  He  asks for at least one more psa today- he will discuss with urology if needs moving forward Lab Results  Component Value Date   PSA 1.58 04/16/2020   PSA 3.17 10/26/2019   PSA 3.17 10/16/2019   5. Colon cancer screening -  04/28/21  with 3 year repeat 6. Skin cancer screening- Dr. [place] Crane dermatology- states has visit in January. advised regular sunscreen use. Denies worrisome, changing, or new skin lesions.  7. Smoking associated screening (lung cancer screening, AAA screen 65-75, UA)- Former smoker- 4 pack years quit 1972. aaa declined. 8. STD screening - not dating and declines    Status of chronic or acute concerns    #CAD- followed with Dr. Harrington Challenger #hyperlipidemia/ Aortic Athrosclerosis S: Medication: Atorvastatin 10 mg daily, Plavix 75 mg -shortness of breath. Occasional chest discomfort that resolves with rolaids/burping- has had EGD in past and cardiology is aware per patient- no worsening recnetly Lab Results  Component Value Date   CHOL 119 10/19/2020   HDL 35 (L) 10/19/2020   LDLCALC 66 10/19/2020   TRIG 98 10/19/2020   CHOLHDL 3.4 10/19/2020   A/P: For CAD-appears largely  asymptomatic- sounds like possible GERD when he has chest pain- continue current meds For hyperlipidemia-hopefully stable- update lipid panel  For aortic atherosclerosis-ldl goal under 70- has been at goal   #hypertension S: medication: Atenol 12.5 mg daily, Irbesartan-hctz 150-12.5 mg takes two tablets daily Home readings #s: good when occasionally checks A/P: Controlled. Continue current medications.  -has potassium if needed  #Chronic kidney disease stage III S: GFR is typically in the high 50s range -Patient knows to avoid NSAIDs  A/P: have been stable- update cmp otday   # Hyperglycemia/insulin resistance/prediabetes- peak a1c 6.14 April 2021 S:  Medication: None Exercise and diet- see above-worried about weight gain Lab Results  Component Value Date   HGBA1C 6.1 04/20/2021   A/P: hopefully stable  - worried with weight gain- update a1c  # ulcerative colitis- continued to follow up with Dr. Fuller Plan. Remained on mercaptopurine - last colonoscopy 04/28/21    #GERD- Pepcid reasonably controlling for most part once a day (discussed could do twice a day and see if cuts down on rolaids)- sparing rolaids-  continue current medications.    #Microscopic Hematuria- CT and cystoscopy largely reassuring with plan for 1-year follow-up- continue follow-up with urology Dr. Lovena Neighbours.  Patient remained on tamsulosin-likely prostate related bleeding with last cystoscopy Mar 15, 2021   # foot/plantar fasciitis- better after seeing podiatry and had 2 injections- reported that the symptoms came back and will be seen by Dr. Milinda Pointer on 6/22 afternoon -today states no lingering issues  #Face rash-sounds like rosacea- seen dermatology and was prescribe doxycycline and a steroid cream-he stated this improved- intermittent issues    #hearing loss- has noted over the last 7 months or so if in a crowd has a harder time hearing. Can do ok in one on one room. Restaurants are hard. Declines getting hearing tested at  this time- encouraged him to consider  Recommended follow up: Return in about 6 months (around 04/25/2022) for follow up- or sooner if needed. Future Appointments  Date Time Provider [place]  08/15/2022  1:45 PM [facility] HEALTH COACH [facility]   Lab/Order associations: fasting   ICD-10-CM   1. Preventative health care  Z00.00     2. Hyperlipidemia, unspecified  hyperlipidemia type  E78.5     3. Essential hypertension  I10     4. Hyperglycemia  R73.9     5. Aortic atherosclerosis (HCC)  I70.0     6. Coronary artery disease involving native coronary artery of native heart without angina pectoris  I25.10     7. Gastroesophageal reflux disease, unspecified whether esophagitis present  K21.9     8. Stage 3 chronic kidney disease, unspecified whether stage 3a or 3b CKD ([place])  N18.30       No orders of the defined types were placed in this encounter.  I,Jada Bradford,acting as a scribe for Garret Reddish, MD.,have documented all relevant documentation on the behalf of Garret Reddish, MD,as directed by  Garret Reddish, MD while in the presence of Garret Reddish, MD.  I, Garret Reddish, MD, have reviewed all documentation for this visit. The documentation on 10/25/21 for the exam, diagnosis, procedures, and orders are all accurate and complete.   Return precautions advised.  Garret Reddish, MD

## 2021-10-25 ENCOUNTER — Other Ambulatory Visit: Payer: Self-pay

## 2021-10-25 ENCOUNTER — Encounter: Payer: Self-pay | Admitting: Family Medicine

## 2021-10-25 ENCOUNTER — Ambulatory Visit (INDEPENDENT_AMBULATORY_CARE_PROVIDER_SITE_OTHER): Payer: Medicare HMO | Admitting: Family Medicine

## 2021-10-25 VITALS — BP 132/74 | HR 56 | Temp 98.0°F | Ht 68.0 in | Wt 205.1 lb

## 2021-10-25 DIAGNOSIS — H2513 Age-related nuclear cataract, bilateral: Secondary | ICD-10-CM | POA: Diagnosis not present

## 2021-10-25 DIAGNOSIS — H0102B Squamous blepharitis left eye, upper and lower eyelids: Secondary | ICD-10-CM | POA: Diagnosis not present

## 2021-10-25 DIAGNOSIS — I2510 Atherosclerotic heart disease of native coronary artery without angina pectoris: Secondary | ICD-10-CM

## 2021-10-25 DIAGNOSIS — E785 Hyperlipidemia, unspecified: Secondary | ICD-10-CM

## 2021-10-25 DIAGNOSIS — R972 Elevated prostate specific antigen [PSA]: Secondary | ICD-10-CM | POA: Diagnosis not present

## 2021-10-25 DIAGNOSIS — I10 Essential (primary) hypertension: Secondary | ICD-10-CM | POA: Diagnosis not present

## 2021-10-25 DIAGNOSIS — N183 Chronic kidney disease, stage 3 unspecified: Secondary | ICD-10-CM | POA: Diagnosis not present

## 2021-10-25 DIAGNOSIS — K219 Gastro-esophageal reflux disease without esophagitis: Secondary | ICD-10-CM

## 2021-10-25 DIAGNOSIS — I700 Atherosclerosis of aorta: Secondary | ICD-10-CM

## 2021-10-25 DIAGNOSIS — H35033 Hypertensive retinopathy, bilateral: Secondary | ICD-10-CM | POA: Diagnosis not present

## 2021-10-25 DIAGNOSIS — Z0000 Encounter for general adult medical examination without abnormal findings: Secondary | ICD-10-CM

## 2021-10-25 DIAGNOSIS — R739 Hyperglycemia, unspecified: Secondary | ICD-10-CM

## 2021-10-25 DIAGNOSIS — H4322 Crystalline deposits in vitreous body, left eye: Secondary | ICD-10-CM | POA: Diagnosis not present

## 2021-10-25 LAB — CBC WITH DIFFERENTIAL/PLATELET
Basophils Absolute: 0.1 10*3/uL (ref 0.0–0.1)
Basophils Relative: 1 % (ref 0.0–3.0)
Eosinophils Absolute: 0.2 10*3/uL (ref 0.0–0.7)
Eosinophils Relative: 2.8 % (ref 0.0–5.0)
HCT: 46.9 % (ref 39.0–52.0)
Hemoglobin: 15.7 g/dL (ref 13.0–17.0)
Lymphocytes Relative: 27.9 % (ref 12.0–46.0)
Lymphs Abs: 1.8 10*3/uL (ref 0.7–4.0)
MCHC: 33.5 g/dL (ref 30.0–36.0)
MCV: 89.9 fl (ref 78.0–100.0)
Monocytes Absolute: 0.7 10*3/uL (ref 0.1–1.0)
Monocytes Relative: 10.3 % (ref 3.0–12.0)
Neutro Abs: 3.8 10*3/uL (ref 1.4–7.7)
Neutrophils Relative %: 58 % (ref 43.0–77.0)
Platelets: 237 10*3/uL (ref 150.0–400.0)
RBC: 5.22 Mil/uL (ref 4.22–5.81)
RDW: 16.4 % — ABNORMAL HIGH (ref 11.5–15.5)
WBC: 6.5 10*3/uL (ref 4.0–10.5)

## 2021-10-25 LAB — COMPREHENSIVE METABOLIC PANEL
ALT: 26 U/L (ref 0–53)
AST: 20 U/L (ref 0–37)
Albumin: 4 g/dL (ref 3.5–5.2)
Alkaline Phosphatase: 56 U/L (ref 39–117)
BUN: 16 mg/dL (ref 6–23)
CO2: 31 mEq/L (ref 19–32)
Calcium: 10.2 mg/dL (ref 8.4–10.5)
Chloride: 100 mEq/L (ref 96–112)
Creatinine, Ser: 1.3 mg/dL (ref 0.40–1.50)
GFR: 54.13 mL/min — ABNORMAL LOW (ref >60.00)
Glucose, Bld: 87 mg/dL (ref 70–99)
Potassium: 4.7 mEq/L (ref 3.5–5.1)
Sodium: 140 mEq/L (ref 135–145)
Total Bilirubin: 1.1 mg/dL (ref 0.2–1.2)
Total Protein: 6.9 g/dL (ref 6.0–8.3)

## 2021-10-25 LAB — LIPID PANEL
Cholesterol: 116 mg/dL (ref 0–200)
HDL: 31.4 mg/dL — ABNORMAL LOW (ref >39.00)
LDL Cholesterol: 63 mg/dL (ref 0–99)
NonHDL: 84.56
Total CHOL/HDL Ratio: 4
Triglycerides: 107 mg/dL (ref 0.0–149.0)
VLDL: 21.4 mg/dL (ref 0.0–40.0)

## 2021-10-25 LAB — PSA: PSA: 1.6 ng/mL (ref 0.10–4.00)

## 2021-10-25 LAB — HEMOGLOBIN A1C: Hgb A1c MFr Bld: 5.9 % (ref 4.6–6.5)

## 2021-10-25 NOTE — Patient Instructions (Addendum)
Health Maintenance Due  Topic Date Due   Zoster Vaccines- Shingrix (2 of 2)- we have one logged from 09/24/2018 - you can still restart series at your pharmacy in 2022 and from what you said- chance we can give in 2023- I will keep my ears out on this 11/19/2018   TETANUS/TDAP - consider at pharmacy 09/21/2020   COVID-19 Vaccine (4 - Booster for Moderna series)- recommend at the pharmacy the bivalent booster 11/23/2020   Please stop by lab before you go If you have mychart- we will send your results within 3 business days of [place] receiving them.  If you do not have mychart- we will call you about results within 5 business days of [place] receiving them.  *please also note that you will see labs on mychart as soon as they post. I will later go in and write notes on them- will say "notes from Dr. Yong Channel"   Let us know if you change your mind and want a referral to audiology for hearing test  I would love to see you work towards doing some walking- goal 150 minutes a week long term but could even start with 5-10 minutes a day and build up  Consider pepcid twice daily before meals- if worsening reflux symptoms/more rolaids needed please let us know ASAP- also continue to keep cardiology updated  Recommended follow up: Return in about 6 months (around 04/25/2022) for follow up- or sooner if needed.

## 2021-11-01 ENCOUNTER — Telehealth: Payer: Self-pay | Admitting: Pharmacist

## 2021-11-01 NOTE — Chronic Care Management (AMB) (Signed)
° ° °  Chronic Care Management Pharmacy Assistant   Name: [name]  MRN: [id number] DOB: Jan 18, 1946   Reason for Encounter: General Adherence Call    Recent office visits:  10/25/2021 [facility] (PCP) Marin Olp, MD; no medication changes indicated.  Recent consult visits:  None  Hospital visits:  None in previous 6 months  Medications: Outpatient Encounter Medications as of 11/01/2021  Medication Sig   atenolol (TENORMIN) 25 MG tablet TAKE (1/2) TABLET BY MOUTH ONCE DAILY.   atorvastatin (LIPITOR) 10 MG tablet TAKE 1 TABLET EVERY DAY   clopidogrel (PLAVIX) 75 MG tablet TAKE 1 TABLET EVERY DAY   famotidine (PEPCID) 20 MG tablet Take 20 mg by mouth daily.   fluticasone (CUTIVATE) 0.05 % cream Apply topically 2 (two) times daily as needed.   fluticasone (FLONASE) 50 MCG/ACT nasal spray INHALE 1 SPRAY INTO EACH NOSTRIL ONCE DAILY.   irbesartan-hydrochlorothiazide (AVALIDE) 150-12.5 MG tablet TAKE 2 TABLETS EVERY DAY   mercaptopurine (PURINETHOL) 50 MG tablet TAKE 1 AND 1/2 TABLETS BY MOUTH DAILY. GIVE ON AN EMPTY STOMACH 1 HOUR BEFORE OR 2 HOURS AFTER A MEAL.   nitroGLYCERIN (NITROSTAT) 0.4 MG SL tablet PLACE 1 TAB UNDER TONGUE EVERY 5 MIN IF NEEDED FOR CHEST PAIN. MAY USE 3 TIMES.NO RELIEF CALL 911.   potassium chloride SA (KLOR-CON) 20 MEQ tablet Take 1 tablet as needed daily as instructed by physician   tamsulosin (FLOMAX) 0.4 MG CAPS capsule Take 0.4 mg by mouth daily.    No facility-administered encounter medications on file as of 11/01/2021.   Patient Questions: Have you had any problems recently with your health? Patient states he has not had any problems recently with his health.  Have you had any problems with your pharmacy? Patient states he has not had any problems with his pharmacy.  What issues or side effects are you having with your medications? Patient states he has not had any issues with any of his medications.  What would you like me to pass along to  Leata Mouse, CPP for him to help you with?  Patient did not have anything to pass along at this time.  What can we do to take care of you better? Patient did not have any suggestions.  Care Gaps: Medicare Annual Wellness: Completed 08/05/2021 Hemoglobin A1C: 5.9% on 10/25/2021 Colonoscopy: Next due on 04/28/2024   Future Appointments  Date Time Provider [place]  04/25/2022  1:00 PM Marin Olp, MD [facility]  08/15/2022  1:45 PM [facility] HEALTH COACH [facility]    Star Rating Drugs: Atorvastatin last filled 09/22/2021 90 DS Irbesartan-HCTZ 150-12.5 mg last filled 07/28/2021 90 DS  April D Calhoun, [place] Pharmacist Assistant [phone]

## 2021-11-12 ENCOUNTER — Encounter: Payer: Self-pay | Admitting: Emergency Medicine

## 2021-11-12 ENCOUNTER — Other Ambulatory Visit: Payer: Self-pay

## 2021-11-12 ENCOUNTER — Ambulatory Visit
Admission: EM | Admit: 2021-11-12 | Discharge: 2021-11-12 | Disposition: A | Discharge status: Home / Self Care | Payer: Medicare HMO | Attending: Family Medicine | Admitting: Family Medicine

## 2021-11-12 DIAGNOSIS — U071 COVID-19: Secondary | ICD-10-CM

## 2021-11-12 MED ORDER — MOLNUPIRAVIR EUA 200MG CAPSULE: 4.0000 | ORAL_CAPSULE | Freq: Two times a day (BID) | ORAL | 0 refills | Status: DC

## 2021-11-12 MED ORDER — MOLNUPIRAVIR EUA 200MG CAPSULE
4.0000 | ORAL_CAPSULE | Freq: Two times a day (BID) | ORAL | 0 refills | Status: AC
Start: 2021-11-12 — End: 2021-11-17

## 2021-11-12 MED ORDER — PROMETHAZINE-DM 6.25-15 MG/5ML PO SYRP: 5.0000 mL | ORAL_SOLUTION | Freq: Four times a day (QID) | ORAL | 0 refills | Status: DC | PRN

## 2021-11-12 NOTE — ED Provider Notes (Signed)
[facility]    CSN: [id number] Arrival date & time: 11/12/21  1011      History   Chief Complaint No chief complaint on file.   HPI [name] is a 75 y.o. male.   Presenting today with cough, headache, nasal congestion, sore throat, body aches, chills x 2 days. Home COVID test positive last night. Denies CP, SOB, abdominal pain, N/V/D. Not trying anything OTC for sxs. No known hx of pulmonary dz.    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   UTI (urinary tract infection) 2019    Patient Active Problem List   Diagnosis Date Noted   Aortic atherosclerosis ([place]) 04/20/2021   Actinic keratosis 10/10/2018   Postherpetic neuralgia 03/19/2018   Balanitis 03/06/2017   OA (osteoarthritis) of knee 12/07/2015   Former smoker 06/08/2015   Hyperglycemia 06/08/2015   Edema 02/04/2015   Allergic rhinitis 10/24/2014   CKD (chronic kidney disease), stage III ([place]) 10/24/2014   Obesity (BMI 30-39.9) 04/25/2014   Right cervical radiculopathy 04/12/2013   ULCERATIVE COLITIS-LEFT SIDE 07/03/2008   Sleep apnea 07/02/2008   Low back pain 01/11/2008   GERD 11/07/2007   Trigger finger, acquired 10/23/2007   CAD (coronary artery disease) 06/15/2007   Hyperlipidemia 06/14/2007   Essential hypertension 06/14/2007   History of colonic polyps 06/14/2007    Past Surgical History:  Procedure Laterality Date   [place]  2009   right    TRIGGER FINGER RELEASE     right       Home Medications    Prior to Admission medications   Medication Sig Start Date End Date Taking? Authorizing Provider  molnupiravir EUA (LAGEVRIO) 200 mg CAPS capsule Take 4 capsules (800 mg total) by mouth 2 (two) times daily for 5 days. 11/12/21 11/17/21 Yes Volney American, PA-C  promethazine-dextromethorphan (PROMETHAZINE-DM) 6.25-15 MG/5ML syrup Take 5 mLs by mouth 4 (four) times daily as needed for cough. 11/12/21  Yes Volney American, PA-C  atenolol (TENORMIN) 25 MG tablet TAKE (1/2) TABLET BY MOUTH ONCE DAILY. 08/09/21   Marin Olp, MD  atorvastatin (LIPITOR) 10 MG tablet TAKE 1 TABLET EVERY DAY 01/21/21   Marin Olp, MD  clopidogrel (PLAVIX) 75 MG tablet TAKE 1 TABLET EVERY DAY 01/21/21   Marin Olp, MD  famotidine (PEPCID) 20 MG tablet Take 20 mg by mouth daily.    [provider]  fluticasone (CUTIVATE) 0.05 % cream Apply topically 2 (two) times daily as needed. 04/13/21   [provider]  fluticasone (FLONASE) 50 MCG/ACT nasal spray INHALE 1 SPRAY INTO EACH NOSTRIL ONCE DAILY. 01/27/21   Marin Olp, MD  irbesartan-hydrochlorothiazide (AVALIDE) 150-12.5 MG tablet TAKE 2 TABLETS EVERY DAY 10/04/21   Marin Olp, MD  mercaptopurine (PURINETHOL) 50 MG tablet TAKE 1 AND 1/2 TABLETS BY MOUTH DAILY. GIVE ON AN EMPTY  STOMACH 1 HOUR BEFORE OR 2 HOURS AFTER A MEAL. 10/18/21   Ladene Artist, MD  nitroGLYCERIN (NITROSTAT) 0.4 MG SL tablet PLACE 1 TAB UNDER TONGUE EVERY 5 MIN IF NEEDED FOR CHEST PAIN. MAY USE 3 TIMES.NO RELIEF CALL 911. 06/22/21   Marin Olp, MD  potassium chloride SA (KLOR-CON) 20 MEQ tablet Take 1 tablet as needed daily as instructed by physician 04/21/21   Marin Olp, MD  tamsulosin (FLOMAX) 0.4 MG CAPS capsule Take 0.4 mg by mouth daily.  01/17/18   [provider]    Family History Family History  Problem Relation Age of Onset   Stroke Mother    COPD  Father    Heart disease Sister        valve replacement   CAD Brother    Heart attack Brother    Colon cancer Neg Hx    Esophageal cancer Neg Hx    Liver disease Neg Hx    Diabetes Neg Hx    Rectal cancer Neg Hx    Stomach cancer Neg Hx     Social History Social History   Tobacco Use   Smoking status: Former    Packs/day: 1.00    Years: 3.00    Pack years: 3.00    Types: Cigarettes    Quit date: 11/14/1966    Years since quitting: 55.0   Smokeless tobacco: Former    Types: Chew   Tobacco comments:    quit 50 years ago  Vaping Use   Vaping Use: Never used  Substance Use Topics   Alcohol use: No    Alcohol/week: 0.0 standard drinks   Drug use: No     Allergies   Penicillins  Review of Systems Review of Systems PER HPI  Physical Exam Triage Vital Signs ED Triage Vitals [11/12/21 1255]  Enc Vitals Group     BP 122/73     Pulse Rate 61     Resp 18     Temp 97.9 F (36.6 C)     Temp Source Oral     SpO2 97 %     Weight      Height      Head Circumference      Peak Flow      Pain Score 3     Pain Loc      Pain Edu?      Excl. in [place]?    No data found.  Updated Vital Signs BP 122/73 (BP Location: Right Arm)    Pulse 61    Temp 97.9 F (36.6 C) (Oral)    Resp 18    SpO2 97%   Visual Acuity Right Eye Distance:   Left Eye Distance:   Bilateral Distance:    Right Eye Near:   Left Eye Near:    Bilateral Near:     Physical Exam Vitals and nursing note reviewed.  Constitutional:      Appearance: He is well-developed.  HENT:     Head: Atraumatic.     Right Ear: External ear normal.     Left Ear: External ear normal.     Nose: Rhinorrhea present.     Mouth/Throat:     Pharynx: Posterior oropharyngeal erythema present. No oropharyngeal exudate.  Eyes:     Conjunctiva/sclera: Conjunctivae normal.     Pupils: Pupils are equal, round, and reactive to light.  Cardiovascular:     Rate and Rhythm: Normal rate and regular rhythm.  Pulmonary:  Effort: Pulmonary effort is normal. No respiratory distress.     Breath sounds: No wheezing or rales.  Musculoskeletal:        General: Normal range of motion.     Cervical back: Normal range of motion and neck supple.  Lymphadenopathy:     Cervical: No cervical adenopathy.  Skin:    General: Skin is warm and dry.  Neurological:     Mental Status: He is alert and oriented to person, place, and time.  Psychiatric:        Mood and Affect: Mood normal.        Behavior: Behavior normal.        Thought Content: Thought content normal.        Judgment: Judgment normal.   UC Treatments / Results  Labs (all labs ordered are listed, but only abnormal results are displayed) Labs Reviewed - No data to display  EKG  Radiology No results found.  Procedures Procedures (including critical care time)  Medications Ordered in UC Medications - No data to display  Initial Impression / Assessment and Plan / UC Course  I have reviewed the triage vital signs and the nursing notes.  Pertinent labs & imaging results that were available during my care of the patient were reviewed by me and considered in my medical decision making (see chart for details).     COVID-positive based on home test yesterday.  We will start antiviral therapy, Phenergan DM for symptomatic benefit.  Discussed safe over-the-counter medications additionally for symptoms.  Return for acutely worsening symptoms.  Vitals and exam very reassuring today.  Final Clinical Impressions(s) / UC Diagnoses   Final diagnoses:  [id number]     Discharge Instructions      May take Coricidin HBP or plain Mucinex over-the-counter for symptoms    ED Prescriptions     Medication Sig Dispense Auth. Provider   molnupiravir EUA (LAGEVRIO) 200 mg CAPS capsule Take 4 capsules (800 mg total) by mouth 2 (two) times daily for 5 days. 40 capsule Volney American, [place]   promethazine-dextromethorphan (PROMETHAZINE-DM) 6.25-15 MG/5ML  syrup Take 5 mLs by mouth 4 (four) times daily as needed for cough. 118 mL Volney American, [place]      PDMP not reviewed this encounter.   Volney American, [place] 11/12/21 1328

## 2021-11-12 NOTE — ED Triage Notes (Signed)
Home covid test last night was positive.  Cough, nasal congestion, headache sore throat since Wednesday.

## 2021-11-12 NOTE — Discharge Instructions (Signed)
May take Coricidin HBP or plain Mucinex over-the-counter for symptoms

## 2021-11-18 ENCOUNTER — Other Ambulatory Visit: Payer: Self-pay | Admitting: Gastroenterology

## 2021-11-25 ENCOUNTER — Encounter: Payer: Self-pay | Admitting: Family Medicine

## 2021-11-25 ENCOUNTER — Other Ambulatory Visit: Payer: Self-pay

## 2021-11-25 ENCOUNTER — Ambulatory Visit (INDEPENDENT_AMBULATORY_CARE_PROVIDER_SITE_OTHER): Payer: Medicare HMO | Admitting: Family Medicine

## 2021-11-25 VITALS — BP 120/64 | HR 70 | Temp 98.0°F | Ht 68.0 in | Wt 207.6 lb

## 2021-11-25 DIAGNOSIS — K5150 Left sided colitis without complications: Secondary | ICD-10-CM | POA: Diagnosis not present

## 2021-11-25 DIAGNOSIS — E785 Hyperlipidemia, unspecified: Secondary | ICD-10-CM

## 2021-11-25 DIAGNOSIS — B9689 Other specified bacterial agents as the cause of diseases classified elsewhere: Secondary | ICD-10-CM

## 2021-11-25 DIAGNOSIS — I10 Essential (primary) hypertension: Secondary | ICD-10-CM | POA: Diagnosis not present

## 2021-11-25 DIAGNOSIS — I700 Atherosclerosis of aorta: Secondary | ICD-10-CM | POA: Diagnosis not present

## 2021-11-25 DIAGNOSIS — J329 Chronic sinusitis, unspecified: Secondary | ICD-10-CM

## 2021-11-25 DIAGNOSIS — U071 COVID-19: Secondary | ICD-10-CM | POA: Diagnosis not present

## 2021-11-25 MED ORDER — DOXYCYCLINE HYCLATE 100 MG PO TABS: 100.0000 mg | ORAL_TABLET | Freq: Two times a day (BID) | ORAL | 0 refills | Status: AC

## 2021-11-25 NOTE — Patient Instructions (Addendum)
-   with nearly 2 weeks of sinus pressure and some green discharge- cover for bacterial sinusitis superinfection on top of covid with doxycycline 119m twice daily for 7 days  -discussed 20 days isolation and ideally testing negative x2 24 hours apart due to being immunosuppressed  Recommended follow up: Please see [email or web address] if he fails to improve within the next 7 to 10 days or if symptoms worsen for particularly shortness of breath or fevers at this point.

## 2021-11-25 NOTE — Progress Notes (Signed)
Phone [phone] In person visit   Subjective:   [name] is a 76 y.o. year old very pleasant male patient who presents for/with See problem oriented charting Chief Complaint  Patient presents with   Follow-up    Pt had covid 2 weeks ago, still has runny nose, is sneezing, and is hoarse   Cough    This visit occurred during the SARS-CoV-2 public health emergency.  Safety protocols were in place, including screening questions prior to the visit, additional usage of staff PPE, and extensive cleaning of exam room while observing appropriate contact time as indicated for disinfecting solutions.   Past Medical History-  Patient Active Problem List   Diagnosis Date Noted   ULCERATIVE COLITIS-LEFT SIDE 07/03/2008    Priority: High   CAD (coronary artery disease) 06/15/2007    Priority: High   Aortic atherosclerosis ([place]) 04/20/2021    Priority: Medium    Hyperglycemia 06/08/2015    Priority: Medium    CKD (chronic kidney disease), stage III ([place]) 10/24/2014    Priority: Medium    Sleep apnea 07/02/2008    Priority: Medium    Hyperlipidemia 06/14/2007    Priority: Medium    Essential hypertension 06/14/2007    Priority: Medium    History of colonic polyps 06/14/2007    Priority: Medium    Postherpetic neuralgia 03/19/2018    Priority: Low   Balanitis 03/06/2017    Priority: Low   OA (osteoarthritis) of knee 12/07/2015    Priority: Low   Former smoker 06/08/2015    Priority: Low   Edema 02/04/2015    Priority: Low   Allergic rhinitis 10/24/2014    Priority: Low   Obesity (BMI 30-39.9) 04/25/2014    Priority: Low   Right cervical radiculopathy 04/12/2013    Priority: Low   Low back pain 01/11/2008    Priority: Low   GERD 11/07/2007    Priority: Low   Trigger finger, acquired 10/23/2007    Priority: Low   Actinic keratosis 10/10/2018    Medications- reviewed and updated Current Outpatient Medications  Medication Sig Dispense Refill   atenolol  (TENORMIN) 25 MG tablet TAKE (1/2) TABLET BY MOUTH ONCE DAILY. 45 tablet 3   atorvastatin (LIPITOR) 10 MG tablet TAKE 1 TABLET EVERY DAY 90 tablet 3   clopidogrel (PLAVIX) 75 MG tablet TAKE 1 TABLET EVERY DAY 90 tablet 3   famotidine (PEPCID) 20 MG tablet Take 20 mg by mouth daily.     fluticasone (CUTIVATE) 0.05 % cream Apply topically 2 (two) times daily as needed.     fluticasone (FLONASE) 50 MCG/ACT nasal spray INHALE 1 SPRAY INTO EACH NOSTRIL ONCE DAILY. 16 g 3   irbesartan-hydrochlorothiazide (AVALIDE) 150-12.5 MG tablet TAKE 2 TABLETS EVERY DAY 180 tablet 3   mercaptopurine (PURINETHOL) 50 MG tablet TAKE 1 AND 1/2 TABLETS BY MOUTH DAILY. GIVE ON AN EMPTY STOMACH 1 HOUR BEFORE OR 2 HOURS AFTER A MEAL. 45 tablet 0   nitroGLYCERIN (NITROSTAT) 0.4 MG SL tablet PLACE 1 TAB UNDER TONGUE EVERY 5 MIN IF NEEDED FOR CHEST PAIN. MAY USE 3 TIMES.NO RELIEF CALL 911. 25 tablet 0   potassium chloride SA (KLOR-CON) 20 MEQ tablet Take 1 tablet as needed daily as instructed by physician 30 tablet 3   promethazine-dextromethorphan (PROMETHAZINE-DM) 6.25-15 MG/5ML syrup Take 5 mLs by mouth 4 (four) times daily as needed for cough. 118 mL 0   tamsulosin (FLOMAX) 0.4 MG CAPS capsule Take 0.4 mg by mouth daily.  No current facility-administered medications for this visit.     Objective:  BP 120/64    Pulse 70    Temp 98 F (36.7 C)    Ht 5' 8"  (1.727 m)    Wt 207 lb 9.6 oz (94.2 kg)    SpO2 97%    BMI 31.57 kg/m  Gen: NAD, resting comfortably Frontal sinus tenderness noted.  Nasal turbinates erythematous and slightly swollen.  Oropharynx largely normal other than some drainage.  Tympanic membranes normal. CV: RRR no murmurs rubs or gallops Lungs: CTAB no crackles, wheeze, rhonchi Ext: no edema Skin: warm, dry    Assessment and Plan   # COVID-19 Infection Update S:Patient presented to Urgent Care 11/12/2021 with a cough, headache, nasal congestion, sore throat, body aches, and chills x 2 days. He  took a home COVID test the night prior which came back positive. Did not try anything OTC for his symptoms. Did have fever on but nothing recently.  - no labs or imaging was ordered during the encounter. - patient was treated with antiviral mild inferior and Phenergan DM and was to return for any new or worsening symptoms.  Today he reports still having lingering symptoms that include runny nose, sneezing, and is hoarse. We discussed he is immunosuppressed on mercaptopurine. He also has some sinus pressure and is blowing out green discharge at times. No fevers or shortness of breath. No chest congestion. No sore throat A/P: with nearly 2 weeks of sinus pressure and some green discharge- cover for bacterial sinusitis superinfection on top of covid with doxycycline 197m twice daily for 7 days due to PCN allergy -discussed 20 days isolation and ideally testing negative x2 24 hours apart due to being immunosuppressed -Patient with stable left-sided ulcerative colitis-even on the mercaptopurine may be contributing to prolonged illness I would not want to cause a flare in ulcerative colitis so would continue current medication- mercaptopurine  #CAD- follows with Dr. RHarrington Challenger#hyperlipidemia/ Aortic Athrosclerosis S: Medication: Atorvastatin 10 mg daily, Plavix 75 mg - with coughing/sneezing has developed some left upper chest pinpoint tenderness- no exertional pain- does not feel like prior cardiac pain Lab Results  Component Value Date   CHOL 116 10/25/2021   HDL 31.40 (L) 10/25/2021   LDLCALC 63 10/25/2021   TRIG 107.0 10/25/2021   CHOLHDL 4 10/25/2021   A/P: For CAD-reproducible muscular left upper chest pain- does not sound cardiac- no exertional pain- continue current meds For hyperlipidemia-looked great on last check- continue current meds  For aortic atherosclerosis-at goal for this ldl 70 or less- likely stable- continue current meds   #hypertension S: medication: Atenolol 12.5 mg daily,  Irbesartan-hctz 150-12.5 mg takes two tablets daily BP Readings from Last 3 Encounters:  11/25/21 120/64  11/12/21 122/73  10/25/21 132/74  A/P: blood pressure well controlled - continue current meds. With irbesartan hctz in particular encouraged to saty well hydrated   Recommended follow up: Please see [email or web address] if he fails to improve within the next 7 to 10 days or if symptoms worsen for particularly shortness of breath or fevers at this point. Future Appointments  Date Time Provider [place] 04/25/2022  1:00 PM HMarin Olp MD [facility]  08/15/2022  1:45 PM [facility] HEALTH COACH [facility]    Lab/Order associations:   ICD-10-CM   1. COVID-19  U07.1     2. Bacterial sinusitis  J32.9    B96.89     3. Essential hypertension  I10  4. Hyperlipidemia, unspecified hyperlipidemia type  E78.5     5. Aortic atherosclerosis (HCC)  I70.0     6. Left sided ulcerative colitis without complication (HCC) Chronic K51.50       No orders of the defined types were placed in this encounter.  I,Harris Phan,acting as a Education administrator for Garret Reddish, MD.,have documented all relevant documentation on the behalf of Garret Reddish, MD,as directed by  Garret Reddish, MD while in the presence of Garret Reddish, MD.   I, Garret Reddish, MD, have reviewed all documentation for this visit. The documentation on 11/25/21 for the exam, diagnosis, procedures, and orders are all accurate and complete.   Return precautions advised.  Garret Reddish, MD

## 2021-12-07 ENCOUNTER — Other Ambulatory Visit: Payer: Self-pay | Admitting: Family Medicine

## 2021-12-24 ENCOUNTER — Other Ambulatory Visit: Payer: Self-pay | Admitting: Gastroenterology

## 2021-12-31 DIAGNOSIS — H35033 Hypertensive retinopathy, bilateral: Secondary | ICD-10-CM | POA: Diagnosis not present

## 2021-12-31 DIAGNOSIS — H10823 Rosacea conjunctivitis, bilateral: Secondary | ICD-10-CM | POA: Diagnosis not present

## 2021-12-31 DIAGNOSIS — H2181 Floppy iris syndrome: Secondary | ICD-10-CM | POA: Diagnosis not present

## 2021-12-31 DIAGNOSIS — H4322 Crystalline deposits in vitreous body, left eye: Secondary | ICD-10-CM | POA: Diagnosis not present

## 2022-01-24 ENCOUNTER — Other Ambulatory Visit: Payer: Self-pay | Admitting: Gastroenterology

## 2022-01-27 ENCOUNTER — Telehealth: Payer: Self-pay | Admitting: Family Medicine

## 2022-01-27 MED ORDER — ATENOLOL 25 MG PO TABS: ORAL_TABLET | ORAL | 3 refills | Status: DC

## 2022-01-27 NOTE — Telephone Encounter (Signed)
Medication sent to local pharmacy.  ?

## 2022-01-27 NOTE — Telephone Encounter (Signed)
Pt is asking if Dr Yong Channel can call in his medication to a local pharmacy due to his mail order would take too long and his is out. Please advise. ? ?MEDICATION:atenolol (TENORMIN) 25 MG tablet ? ?PHARMACY: ?[place], [place] Phone:  [phone]  ?Fax:  [phone]  ?  ? ? ?

## 2022-01-31 ENCOUNTER — Ambulatory Visit: Payer: Medicare HMO | Admitting: Internal Medicine

## 2022-01-31 ENCOUNTER — Other Ambulatory Visit: Payer: Self-pay

## 2022-01-31 ENCOUNTER — Encounter: Payer: Self-pay | Admitting: Internal Medicine

## 2022-01-31 VITALS — BP 122/72 | HR 54 | Ht 68.0 in | Wt 206.0 lb

## 2022-01-31 DIAGNOSIS — I2510 Atherosclerotic heart disease of native coronary artery without angina pectoris: Secondary | ICD-10-CM | POA: Diagnosis not present

## 2022-01-31 NOTE — Patient Instructions (Signed)
Medication Instructions:  ? ?*If you need a refill on your cardiac medications before your next appointment, please call your pharmacy* ? ? ?Lab Work: ? ?If you have labs (blood work) drawn today and your tests are completely normal, you will receive your results only by: ?MyChart Message (if you have MyChart) OR ?A paper copy in the mail ?If you have any lab test that is abnormal or we need to change your treatment, we will call you to review the results. ? ? ?Testing/Procedures: ?Your physician has requested that you have en exercise stress myoview. For further information please visit [email or web address]. Please follow instruction sheet, as given. ? ? ? ?Follow-Up: ?At [facility], you and your health needs are our priority.  As part of our continuing mission to provide you with exceptional heart care, we have created designated Provider Care Teams.  These Care Teams include your primary Cardiologist (physician) and Advanced Practice Providers (APPs -  Physician Assistants and Nurse Practitioners) who all work together to provide you with the care you need, when you need it. ? ?We recommend signing up for the patient portal called "MyChart".  Sign up information is provided on this After Visit Summary.  MyChart is used to connect with patients for Virtual Visits (Telemedicine).  Patients are able to view lab/test results, encounter notes, upcoming appointments, etc.  Non-urgent messages can be sent to your provider as well.   ?To learn more about what you can do with MyChart, go to [email or web address].   ? ?Your next appointment:   ?1 year(s) ? ?The format for your next appointment:   ?In Person ? ?Provider:   ?Dorris Carnes, MD   ? ? ?Other Instructions ?  ?

## 2022-01-31 NOTE — Progress Notes (Signed)
? ?Cardiology Office Note ? ? ?Date:  01/31/2022  ? ?ID:  [name], DOB September 21, 1946, MRN [id number] ? ?PCP:  Marin Olp, MD  ?Cardiologist:   Dorris Carnes, MD  ? ? ?F/U of CAD   ?  ?History of Present Illness: ?[name] is a 76 y.o. male with a history of CAD (MI in 1993)  Underwent PTCA to LAD  Nuclear scan in 2011 normal  Echo 2016 LVEF normal   ?I saw him in 2021 ?\ ?Breakfast :  Cereal or waffle or [employer] sausage biscuit ?Lunch  Sandwich or fast food  ?Dinner  Meat/ veggies ? ?I saw him in early 2022 Since then he has done [place] in Dec 2022    Took a couple weeks to get better    ?Breathing is OK    No CP    No dizzeinss   ?Notes occasional indigestion   Got tightness and discomfort in chest  Burps and it goes awsy  ? ?Current Meds  ?Medication Sig  ? atenolol (TENORMIN) 25 MG tablet TAKE (1/2) TABLET BY MOUTH ONCE DAILY.  ? atorvastatin (LIPITOR) 10 MG tablet TAKE 1 TABLET EVERY DAY  ? clopidogrel (PLAVIX) 75 MG tablet TAKE 1 TABLET EVERY DAY  ? famotidine (PEPCID) 20 MG tablet Take 20 mg by mouth daily.  ? fluticasone (CUTIVATE) 0.05 % cream Apply topically 2 (two) times daily as needed.  ? fluticasone (FLONASE) 50 MCG/ACT nasal spray INHALE 1 SPRAY INTO EACH NOSTRIL ONCE DAILY.  ? irbesartan-hydrochlorothiazide (AVALIDE) 150-12.5 MG tablet TAKE 2 TABLETS EVERY DAY  ? mercaptopurine (PURINETHOL) 50 MG tablet TAKE 1 AND 1/2 TABLETS BY MOUTH DAILY. GIVE ON AN EMPTY STOMACH 1 HOUR BEFORE OR 2 HOURS AFTER A MEAL.  ? nitroGLYCERIN (NITROSTAT) 0.4 MG SL tablet PLACE 1 TAB UNDER TONGUE EVERY 5 MIN IF NEEDED FOR CHEST PAIN. MAY USE 3 TIMES.NO RELIEF CALL 911.  ? potassium chloride SA (KLOR-CON) 20 MEQ tablet Take 1 tablet as needed daily as instructed by physician  ? promethazine-dextromethorphan (PROMETHAZINE-DM) 6.25-15 MG/5ML syrup Take 5 mLs by mouth 4 (four) times daily as needed for cough.  ? tamsulosin (FLOMAX) 0.4 MG CAPS capsule Take 0.4 mg by mouth daily.    ? ? ? ?Allergies:   Penicillins  ? ?Past Medical History:  ?Diagnosis Date  ? Adenomatous polyps 03/2004, 11/2012  ? Arthritis   ? Bradycardia 2018  ? Clotting disorder ([place])   ? on plavix  ? Coronary atherosclerosis of unspecified type of vessel, native or graft   ? Esophagitis 1993  ? GERD (gastroesophageal reflux disease)   ? Hyperlipidemia   ? Hypertension   ? Myocardial infarction [facility]) 1993  ? Shingles 2019  ? Sleep apnea   ? cpap  ? Stroke [facility]) 02/2014  ? Trigger finger (acquired)   ? 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 UTI (urinary tract infection) 2019  ? ? ?Past Surgical History:  ?Procedure Laterality Date  ? ANGIOPLASTY  1993  ? CHOLECYSTECTOMY  2002  ? [place]  ? ROTATOR CUFF REPAIR  2009  ? right  ? TRIGGER FINGER RELEASE    ? right  ? ? ? ?Social History:  The patient  reports that he quit smoking about 55 years ago. His smoking use included cigarettes. He has a 3.00 pack-year smoking history. He has quit using smokeless tobacco.  His smokeless tobacco use included chew. He reports that he does not drink  alcohol and does not use drugs.  ? ?Family History:  The patient's family history includes CAD in his brother; COPD in his father; Heart attack in his brother; Heart disease in his sister; Stroke in his mother.  ? ? ?ROS:  Please see the history of present illness. All other systems are reviewed and  Negative to the above problem except as noted.  ? ? ?PHYSICAL EXAM: ?VS:  BP 122/72   Pulse (!) 54   Ht 5' 8"  (1.727 m)   Wt 206 lb (93.4 kg)   SpO2 98%   BMI 31.32 kg/m?   ?YEM:VVKPQ 76 yo , in no acute distress  ?HEENT: normal  ?Neck: no JVD orcarotid bruits,  ?Cardiac: RRR; no murmurs, rubs, or gallops,no signif edema  ?Respiratory:  clear to auscultation bilaterally,  ?GI: soft, nontender, nondistended, + BS  No hepatomegaly  ?MS: no deformity Moving all extremities   ?Skin: warm and dry, Rash on face  ?Neuro:  Strength and sensation are intact ?Psych: euthymic mood,  full affect ? ? ?EKG:  EKG is ordered today. SB   54 bpm    ? ?Lipid Panel ?   ?Component Value Date/Time  ? CHOL 116 10/25/2021 1130  ? TRIG 107.0 10/25/2021 1130  ? TRIG 88 10/20/2006 0927  ? HDL 31.40 (L) 10/25/2021 1130  ? CHOLHDL 4 10/25/2021 1130  ? VLDL 21.4 10/25/2021 1130  ? [place] 63 10/25/2021 1130  ? [place] 66 10/19/2020 1141  ? ?  ? ?Wt Readings from Last 3 Encounters:  ?01/31/22 206 lb (93.4 kg)  ?11/25/21 207 lb 9.6 oz (94.2 kg)  ?10/25/21 205 lb 1.6 oz (93 kg)  ?  ? ? ?ASSESSMENT AND PLAN: ? ?1  CAD  Pt has occasional chest discomfort with yard work   Doesn't push himself    ?REcomm Stress myoview    ? ?2  HL  LDL is very good at 63  HDL 31   ? ? ?3  HTN  BP is good  COntinue meds  ? ?4  Morbid obesity Eats a lot of bread  Discussed  ? ?F/U in 1 year   ? ?Current medicines are reviewed at length with the patient today.  The patient does not have concerns regarding medicines. ? ?Signed, ?Dorris Carnes, MD  ?01/31/2022 1:34 PM    ?[facility] ?[address], [place], [place]  [address] ?Phone: [phone]; Fax: [phone]  ? ? ?

## 2022-02-08 ENCOUNTER — Telehealth (HOSPITAL_COMMUNITY): Payer: Self-pay

## 2022-02-08 DIAGNOSIS — B078 Other viral warts: Secondary | ICD-10-CM | POA: Diagnosis not present

## 2022-02-08 DIAGNOSIS — L570 Actinic keratosis: Secondary | ICD-10-CM | POA: Diagnosis not present

## 2022-02-08 DIAGNOSIS — X32XXXD Exposure to sunlight, subsequent encounter: Secondary | ICD-10-CM | POA: Diagnosis not present

## 2022-02-08 DIAGNOSIS — L718 Other rosacea: Secondary | ICD-10-CM | POA: Diagnosis not present

## 2022-02-08 NOTE — Telephone Encounter (Signed)
Detailed instructions left on the patient's answering machine. Asked to call back with any questions. S.Elliemae Braman EMTP 

## 2022-02-10 ENCOUNTER — Ambulatory Visit (HOSPITAL_COMMUNITY): Payer: Medicare HMO | Attending: Cardiology

## 2022-02-10 DIAGNOSIS — I2510 Atherosclerotic heart disease of native coronary artery without angina pectoris: Secondary | ICD-10-CM | POA: Insufficient documentation

## 2022-02-10 LAB — MYOCARDIAL PERFUSION IMAGING
Angina Index: 0
Duke Treadmill Score: 6
Estimated workload: 7
Exercise duration (min): 5 min
Exercise duration (sec): 41 s
LV dias vol: 47 mL (ref 62–150)
LV sys vol: 14 mL
MPHR: 146 {beats}/min
Nuc Stress EF: 70 %
Peak HR: 142 {beats}/min
Percent HR: 97 %
Rest HR: 61 {beats}/min
Rest Nuclear Isotope Dose: 10.7 mCi
SDS: 1
SRS: 0
SSS: 1
ST Depression (mm): 0 mm
Stress Nuclear Isotope Dose: 32.2 mCi
TID: 0.88

## 2022-02-10 MED ORDER — TECHNETIUM TC 99M TETROFOSMIN IV KIT
32.2000 | PACK | Freq: Once | INTRAVENOUS | Status: AC | PRN
  Administered 2022-02-10: 32.2 via INTRAVENOUS
  Filled 2022-02-10: qty 33

## 2022-02-10 MED ORDER — REGADENOSON 0.4 MG/5ML IV SOLN: 0.4000 mg | Freq: Once | INTRAVENOUS | Status: DC

## 2022-02-10 MED ORDER — TECHNETIUM TC 99M TETROFOSMIN IV KIT
10.7000 | PACK | Freq: Once | INTRAVENOUS | Status: AC | PRN
  Administered 2022-02-10: 10.7 via INTRAVENOUS
  Filled 2022-02-10: qty 11

## 2022-02-16 ENCOUNTER — Ambulatory Visit: Payer: Medicare HMO | Admitting: Gastroenterology

## 2022-02-16 ENCOUNTER — Encounter: Payer: Self-pay | Admitting: Gastroenterology

## 2022-02-16 VITALS — BP 128/80 | HR 56 | Ht 68.0 in | Wt 206.0 lb

## 2022-02-16 DIAGNOSIS — K219 Gastro-esophageal reflux disease without esophagitis: Secondary | ICD-10-CM

## 2022-02-16 DIAGNOSIS — K5150 Left sided colitis without complications: Secondary | ICD-10-CM

## 2022-02-16 MED ORDER — MESALAMINE ER 0.375 G PO CP24: 1.5000 g | ORAL_CAPSULE | Freq: Every day | ORAL | 11 refills | Status: DC

## 2022-02-16 MED ORDER — PANTOPRAZOLE SODIUM 40 MG PO TBEC: 40.0000 mg | DELAYED_RELEASE_TABLET | Freq: Every day | ORAL | 11 refills | Status: DC

## 2022-02-16 NOTE — Progress Notes (Signed)
? ? ?  History of Present Illness: This is a 76 year old male returning for follow up of left sided ulcerative colitis and GERD.  He relates worsening problems with substernal burning.  His symptoms had an exertional component and he underwent evaluation by Dr. Dorris Carnes including a SPECT myocardial perfusion with exercise which was read as showing no evidence of ischemia with normal LV function.  He currently takes famotidine daily.  No diarrhea or rectal bleeding.  He had inactive chronic left-sided colitis on colonoscopy performed in June 2022.  He asks about discontinuing 6-MP. ? ?Current Medications, Allergies, Past Medical History, Past Surgical History, Family History and Social History were reviewed in [employer] record. ? ? ?Physical Exam: ?General: Well developed, well nourished, no acute distress ?Head: Normocephalic and atraumatic ?Eyes: Sclerae anicteric, EOMI ?Ears: Normal auditory acuity ?Mouth: Not examined, mask on during Covid-19 pandemic ?Lungs: Clear throughout to auscultation ?Heart: Regular rate and rhythm; no murmurs, rubs or bruits ?Abdomen: Soft, non tender and non distended. No masses, hepatosplenomegaly or hernias noted. Normal Bowel sounds ?Rectal: Not done ?Musculoskeletal: Symmetrical with no gross deformities  ?Pulses:  Normal pulses noted ?Extremities: No clubbing, cyanosis, edema or deformities noted ?Neurological: Alert oriented x 4, grossly nonfocal ?Psychological:  Alert and cooperative. Normal mood and affect ? ? ?Assessment and Recommendations: ? ?Left sided ulcerative colitis.  His symptoms have been well controlled for years and inactive chronic left-sided colitis was noted on colonoscopy in June 2022.  We discussed the risks and benefits of discontinuing 6-MP and changing to a 5-ASA medication.  I addressed his questions to his satisfaction.  He prefers to discontinue 6-MP and begin Apriso 1.5 g daily.  Contact us if colitis symptoms become active or  other GI problems are noted.  REV in 1 year.  ?GERD.  Follow antireflux measures.  Discontinue famotidine.  Begin pantoprazole 40 mg p.o. every morning.  Contact us if reflux symptoms are not adequately controlled within the next 2 weeks.  REV in 1 year.  ?

## 2022-02-16 NOTE — Patient Instructions (Signed)
Stop taking famotidine.  ? ?We have sent the following medications to your pharmacy for you to pick up at your convenience: pantoprazole 40 mg daily and Apriso 0.375 mg taking 4 capsules every morning.  ? ?Call our office in 2 weeks if your reflux symptoms are not improved.  ? ?Once you have picked up Apriso from the pharmacy, then you may stop the mercaptopurine.  ? ?The [place] [facility] providers would like to encourage you to use [facility] to communicate with providers for non-urgent requests or questions.  Due to long hold times on the telephone, sending your provider a message by [facility] may be a faster and more efficient way to get a response.  Please allow 48 business hours for a response.  Please remember that this is for non-urgent requests.  ? ?Thank you for choosing me and [facility]. ? ?Malcolm T. Dagoberto Ligas., MD., Marval Regal ? ? ?

## 2022-02-23 ENCOUNTER — Telehealth: Payer: Self-pay | Admitting: Gastroenterology

## 2022-02-23 NOTE — Telephone Encounter (Signed)
Patient called stating the mesalamine was going to cost him $95 a month and wanted to know if there was an alternative.  Please call patient and advise.  Thank you. ?

## 2022-02-23 NOTE — Telephone Encounter (Signed)
Informed patient to contact his insurance to find out what the preferred compliable medication in place of Apriso. Patient states he will contact his insurance and call back. ?

## 2022-02-24 NOTE — Telephone Encounter (Signed)
Received fax from [place] stating patient's insurance will only cover sulfasalazine. Will send to Dr. Fuller Plan for dosage recommendations.  ?

## 2022-02-25 MED ORDER — SULFASALAZINE 500 MG PO TABS: ORAL_TABLET | ORAL | 5 refills | Status: DC

## 2022-02-25 NOTE — Telephone Encounter (Signed)
Informed patient of possible side effects with sulfasalazine and Dr. Lynne Leader recommendations. Patient states he wants to try to take sulfasalazine even when aware of possible side effects.  Informed patient of sulfasalazine dosage and to call our office with any possible reactions to the medication. Also, to start over the counter folic acid once daily while taking sulfasalazine. Patient agreed and verbalized understanding. ?

## 2022-03-24 ENCOUNTER — Telehealth: Payer: Self-pay | Admitting: Pharmacist

## 2022-03-24 NOTE — Progress Notes (Signed)
Chronic Care Management Pharmacy Assistant   Name: [name]  MRN: [id number] DOB: 12/14/1945   Reason for Encounter: General Adherence Call    Recent office visits:  11/25/2021 OV (PCP) Marin Olp, MD;  with nearly 2 weeks of sinus pressure and some green discharge- cover for bacterial sinusitis superinfection on top of covid with doxycycline 126m twice daily for 7 days due to PCN allergy  Recent consult visits:  02/16/2022 OV (Gertie Fey SLadene Artist MD;  We discussed the risks and benefits of discontinuing 6-MP and changing to a 5-ASA medication.  I addressed his questions to his satisfaction.  He prefers to discontinue 6-MP and begin Apriso 1.5 g daily.  Discontinue famotidine.  Begin pantoprazole 40 mg p.o. every morning.   01/31/2022 OV (Cardiology) RFay Records MD; no medication changes indicated.  Hospital visits:  11/12/2021 ED/Urgent Care visit for COVID-19 -Rx Molnupiravir 200 mg - 4 capsules twice daily x 5 days -Promethazine-dextromethorphan 6.25-15 mg/mL qid prn cough  Medications: Outpatient Encounter Medications as of 03/24/2022  Medication Sig   atenolol (TENORMIN) 25 MG tablet TAKE (1/2) TABLET BY MOUTH ONCE DAILY.   atorvastatin (LIPITOR) 10 MG tablet TAKE 1 TABLET EVERY DAY   clopidogrel (PLAVIX) 75 MG tablet TAKE 1 TABLET EVERY DAY   fluticasone (CUTIVATE) 0.05 % cream Apply topically 2 (two) times daily as needed.   fluticasone (FLONASE) 50 MCG/ACT nasal spray INHALE 1 SPRAY INTO EACH NOSTRIL ONCE DAILY.   irbesartan-hydrochlorothiazide (AVALIDE) 150-12.5 MG tablet TAKE 2 TABLETS EVERY DAY   nitroGLYCERIN (NITROSTAT) 0.4 MG SL tablet PLACE 1 TAB UNDER TONGUE EVERY 5 MIN IF NEEDED FOR CHEST PAIN. MAY USE 3 TIMES.NO RELIEF CALL 911.   pantoprazole (PROTONIX) 40 MG tablet Take 1 tablet (40 mg total) by mouth daily.   potassium chloride SA (KLOR-CON) 20 MEQ tablet Take 1 tablet as needed daily as instructed by physician   sulfaSALAzine  (AZULFIDINE) 500 MG tablet Take one tablet by mouth twice daily x 7 days, then increase to 2 tablets by mouth twice daily long term.   tamsulosin (FLOMAX) 0.4 MG CAPS capsule Take 0.4 mg by mouth daily.    Facility-Administered Encounter Medications as of 03/24/2022  Medication   regadenoson (LEXISCAN) injection SOLN 0.4 mg   Contacted HVerdie Mosherfor General Review Call   Chart Review:  Have there been any documented new, changed, or discontinued medications since last visit? Yes  Has there been any documented recent hospitalizations or ED visits since last visit with Clinical Pharmacist? No Brief Summary:   Adherence Review:  Does the Clinical Pharmacist Assistant have access to adherence rates? Yes Adherence rates for STAR metric medications: Atorvastatin 10 mg last filled 12/09/2021 90 DS Irbesartan-HCTZ 150 mg- 12.5 mg last filled 01/10/2022 90 DS Does the patient have >5 day gap between last estimated fill dates for any of the above medications or other medication gaps? No Reason for medication gaps.   Disease State Questions:  Able to connect with Patient? Yes Did patient have any problems with their health recently? No Have you had any admissions or emergency room visits or worsening of your condition(s) since last visit? No Have you had any visits with new specialists or providers since your last visit? No Have you had any new health care problem(s) since your last visit? No  Have you run out of any of your medications since you last spoke with clinical pharmacist? No What caused you to run out of your  medications? Are there any medications you are not taking as prescribed? No What kept you from taking your medications as prescribed? Are you having any issues or side effects with your medications? No  Do you have any other health concerns or questions you want to discuss with your Clinical Pharmacist before your next visit? No Note additional concerns and  questions from Patient. Are there any health concerns that you feel we can do a better job addressing? No  Are you having any problems with any of the following since the last visit:   None   12. Any falls since last visit? No 13. Any increased or uncontrolled pain since last visit? No     Care Gaps: Medicare Annual Wellness: Completed 08/05/2021 Hemoglobin A1C: 5.9% on 10/25/2021 Colonoscopy: Next due on 04/28/2024  Future Appointments  Date Time Provider [place]  04/25/2022  1:00 PM Marin Olp, MD [facility]  08/15/2022  1:45 PM [facility] HEALTH COACH [facility]   Star Rating Drugs: Atorvastatin 10 mg last filled 12/09/2021 90 DS Irbesartan-HCTZ 150 mg- 12.5 mg last filled 01/10/2022 90 DS  April D Calhoun, [place] Pharmacist Assistant [phone]

## 2022-04-18 ENCOUNTER — Ambulatory Visit (INDEPENDENT_AMBULATORY_CARE_PROVIDER_SITE_OTHER): Payer: Medicare HMO | Admitting: Family Medicine

## 2022-04-18 ENCOUNTER — Encounter: Payer: Self-pay | Admitting: Family Medicine

## 2022-04-18 VITALS — BP 122/71 | HR 56 | Temp 98.4°F | Ht 68.0 in | Wt 205.6 lb

## 2022-04-18 DIAGNOSIS — E785 Hyperlipidemia, unspecified: Secondary | ICD-10-CM | POA: Diagnosis not present

## 2022-04-18 DIAGNOSIS — R739 Hyperglycemia, unspecified: Secondary | ICD-10-CM

## 2022-04-18 DIAGNOSIS — N183 Chronic kidney disease, stage 3 unspecified: Secondary | ICD-10-CM | POA: Diagnosis not present

## 2022-04-18 DIAGNOSIS — I10 Essential (primary) hypertension: Secondary | ICD-10-CM

## 2022-04-18 DIAGNOSIS — M542 Cervicalgia: Secondary | ICD-10-CM

## 2022-04-18 NOTE — Addendum Note (Signed)
Addended by: Marin Olp on: 04/18/2022 03:13 PM   Modules accepted: Orders

## 2022-04-18 NOTE — Progress Notes (Signed)
Phone [phone] In person visit   Subjective:   [name] is a 76 y.o. year old very pleasant male patient who presents for/with See problem oriented charting Chief Complaint  Patient presents with   Cough    Dry cough,Pain on neck when cough    Neck Pain    Sore neck on right side    Past Medical History-  Patient Active Problem List   Diagnosis Date Noted   ULCERATIVE COLITIS-LEFT SIDE 07/03/2008    Priority: High   CAD (coronary artery disease) 06/15/2007    Priority: High   Aortic atherosclerosis ([place]) 04/20/2021    Priority: Medium    Hyperglycemia 06/08/2015    Priority: Medium    CKD (chronic kidney disease), stage III ([place]) 10/24/2014    Priority: Medium    Sleep apnea 07/02/2008    Priority: Medium    Hyperlipidemia 06/14/2007    Priority: Medium    Essential hypertension 06/14/2007    Priority: Medium    History of colonic polyps 06/14/2007    Priority: Medium    Postherpetic neuralgia 03/19/2018    Priority: Low   Balanitis 03/06/2017    Priority: Low   OA (osteoarthritis) of knee 12/07/2015    Priority: Low   Former smoker 06/08/2015    Priority: Low   Edema 02/04/2015    Priority: Low   Allergic rhinitis 10/24/2014    Priority: Low   Obesity (BMI 30-39.9) 04/25/2014    Priority: Low   Right cervical radiculopathy 04/12/2013    Priority: Low   Low back pain 01/11/2008    Priority: Low   GERD 11/07/2007    Priority: Low   Trigger finger, acquired 10/23/2007    Priority: Low   Actinic keratosis 10/10/2018    Medications- reviewed and updated Current Outpatient Medications  Medication Sig Dispense Refill   atenolol (TENORMIN) 25 MG tablet TAKE (1/2) TABLET BY MOUTH ONCE DAILY. 45 tablet 3   atorvastatin (LIPITOR) 10 MG tablet TAKE 1 TABLET EVERY DAY 90 tablet 3   clopidogrel (PLAVIX) 75 MG tablet TAKE 1 TABLET EVERY DAY 90 tablet 3   fluticasone (CUTIVATE) 0.05 % cream Apply topically 2 (two) times daily as needed.      fluticasone (FLONASE) 50 MCG/ACT nasal spray INHALE 1 SPRAY INTO EACH NOSTRIL ONCE DAILY. 16 g 3   irbesartan-hydrochlorothiazide (AVALIDE) 150-12.5 MG tablet TAKE 2 TABLETS EVERY DAY 180 tablet 3   nitroGLYCERIN (NITROSTAT) 0.4 MG SL tablet PLACE 1 TAB UNDER TONGUE EVERY 5 MIN IF NEEDED FOR CHEST PAIN. MAY USE 3 TIMES.NO RELIEF CALL 911. 25 tablet 0   pantoprazole (PROTONIX) 40 MG tablet Take 1 tablet (40 mg total) by mouth daily. 30 tablet 11   potassium chloride SA (KLOR-CON) 20 MEQ tablet Take 1 tablet as needed daily as instructed by physician 30 tablet 3   sulfaSALAzine (AZULFIDINE) 500 MG tablet Take one tablet by mouth twice daily x 7 days, then increase to 2 tablets by mouth twice daily long term. 120 tablet 5   tamsulosin (FLOMAX) 0.4 MG CAPS capsule Take 0.4 mg by mouth daily.      No current facility-administered medications for this visit.   Facility-Administered Medications Ordered in Other Visits  Medication Dose Route Frequency Provider Last Rate Last Admin   regadenoson (LEXISCAN) injection SOLN 0.4 mg  0.4 mg Intravenous Once Tobb, Kardie, DO         Objective:  BP 122/71   Pulse (!) 56   Temp 98.4 F (  36.9 C) (Temporal)   Ht 5' 8"  (1.727 m)   Wt 205 lb 9.6 oz (93.3 kg)   SpO2 97%   BMI 31.26 kg/m  Gen: NAD, resting comfortably No lymphadenopathy or thyromegaly CV: RRR no murmurs rubs or gallops Lungs: CTAB no crackles, wheeze, rhonchi Ext: no edema Skin: warm, dry Good ROM of neck without pain- pain along sternocleidomastoid and potentially deep to it-  mild swelling noted    Assessment and Plan   #Right  neck pain #GERD S: Patient complains of right neck pain starting about 3 weeks ago. Thought maybe slept awkwardly at first. Soreness on right side of neck that stays sore all the time- doesn't hurt when he eats or swallows but when he  is more aware of it. Has not felt any bulge/lump in the area. Does not hurt with position change. Sparing dry cough maybe  allergy related- cleared from covid in January. Has not tried heat or ice. Some loose stool but doesn't feel associated with this (he plans to reach out to Dr. Fuller Plan). Did recently have pepcid advanced to pantoprazole with worsening reflux in April- reflux has improved. Pain does not wake him at night. No unintentional weight loss.  A/P: Right neck pain/soft tissue related it seems (very tender one exam but does not worsen with position change)- I do not think x-ray would be very helpful. Rule out mass or infection with CT neck.   - ok to take tylenol before bed- offered muscle relaxant but he wants to hold off - no bruits- held off on carotid US or thyroid US with no thyromegaly. Check tsh  GERD much improved- continue pantoprazole- do not think symptoms are GERD related   #CAD- followed with Dr. Harrington Challenger #hyperlipidemia/ Aortic Athrosclerosis S: Medication: Atorvastatin 10 mg daily, Plavix 75 mg -no chest pain or shortness of breath Lab Results  Component Value Date   CHOL 116 10/25/2021   HDL 31.40 (L) 10/25/2021   LDLCALC 63 10/25/2021   TRIG 107.0 10/25/2021   CHOLHDL 4 10/25/2021   A/P: For CAD-asymptomatic- continue current meds For hyperlipidemia-at goal recently- continue current meds  For aortic atherosclerosis-at goal for this 70 or less- rpresumed stable   #hypertension S: medication: Atenol 12.5 mg daily, Irbesartan-hctz 150-12.5 mg takes two tablets daily BP Readings from Last 3 Encounters:  04/18/22 122/71  02/16/22 128/80  01/31/22 122/72    A/P: Controlled. Continue current medications.    #Chronic kidney disease stage III S: GFR is typically in the high 50s range -Patient knows to avoid NSAIDs - knows tylenol only A/P: stable on last check- update with cmp today   # Hyperglycemia/insulin resistance/prediabetes- peak a1c 6.14 April 2021  A/P: hopefully stable- update a1c today. Continue cwithout meds for now. Encouraged need for healthy eating, regular exercise,  weight loss.    # ulcerative colitis- continued to follow up with Dr. Fuller Plan. Remained on mercaptopurine previously but recently changed- he may reach back out to consider switching back- he felt like he tolerated this better  #plans to follow up with Dr. Lovena Neighbours soon for urological needs    Recommended follow up: Return in about 28 weeks (around 10/31/2022) for physical or sooner if needed.Schedule b4 you leave. Future Appointments  Date Time Provider [place]  08/15/2022  1:45 PM [facility] HEALTH COACH [facility]    Lab/Order associations:   ICD-10-CM   1. Neck pain  M54.2 CT Soft Tissue Neck W Contrast    TSH    2.  Hyperglycemia  R73.9 HgB A1c    3. Essential hypertension  I10 Comprehensive metabolic panel    4. Hyperlipidemia, unspecified hyperlipidemia type  E78.5 Comprehensive metabolic panel    TSH      No orders of the defined types were placed in this encounter.   Return precautions advised.  Garret Reddish, MD

## 2022-04-18 NOTE — Patient Instructions (Addendum)
Please stop by lab before you go If you have mychart- we will send your results within 3 business days of [place] receiving them.  If you do not have mychart- we will call you about results within 5 business days of [place] receiving them.  *please also note that you will see labs on mychart as soon as they post. I will later go in and write notes on them- will say "notes from Dr. Yong Channel"   We will call you within two weeks about your referral to CT of neck. If you do not hear within 2 weeks, give radiology at [facility] at a call   Recommended follow up: Return in about 28 weeks (around 10/31/2022) for physical or sooner if needed.Schedule b4 you leave.

## 2022-04-19 LAB — CBC WITH DIFFERENTIAL/PLATELET
Basophils Absolute: 0.1 10*3/uL (ref 0.0–0.1)
Basophils Relative: 1.2 % (ref 0.0–3.0)
Eosinophils Absolute: 0.6 10*3/uL (ref 0.0–0.7)
Eosinophils Relative: 8.2 % — ABNORMAL HIGH (ref 0.0–5.0)
HCT: 43.6 % (ref 39.0–52.0)
Hemoglobin: 14.9 g/dL (ref 13.0–17.0)
Lymphocytes Relative: 25.8 % (ref 12.0–46.0)
Lymphs Abs: 1.8 10*3/uL (ref 0.7–4.0)
MCHC: 34.2 g/dL (ref 30.0–36.0)
MCV: 88.4 fl (ref 78.0–100.0)
Monocytes Absolute: 0.7 10*3/uL (ref 0.1–1.0)
Monocytes Relative: 10.1 % (ref 3.0–12.0)
Neutro Abs: 3.8 10*3/uL (ref 1.4–7.7)
Neutrophils Relative %: 54.7 % (ref 43.0–77.0)
Platelets: 183 10*3/uL (ref 150.0–400.0)
RBC: 4.93 Mil/uL (ref 4.22–5.81)
RDW: 15.4 % (ref 11.5–15.5)
WBC: 6.9 10*3/uL (ref 4.0–10.5)

## 2022-04-19 LAB — COMPREHENSIVE METABOLIC PANEL
ALT: 25 U/L (ref 0–53)
AST: 24 U/L (ref 0–37)
Albumin: 3.9 g/dL (ref 3.5–5.2)
Alkaline Phosphatase: 73 U/L (ref 39–117)
BUN: 13 mg/dL (ref 6–23)
CO2: 30 mEq/L (ref 19–32)
Calcium: 9.4 mg/dL (ref 8.4–10.5)
Chloride: 98 mEq/L (ref 96–112)
Creatinine, Ser: 1.33 mg/dL (ref 0.40–1.50)
GFR: 52.49 mL/min — ABNORMAL LOW (ref >60.00)
Glucose, Bld: 81 mg/dL (ref 70–99)
Potassium: 3.4 mEq/L — ABNORMAL LOW (ref 3.5–5.1)
Sodium: 138 mEq/L (ref 135–145)
Total Bilirubin: 0.5 mg/dL (ref 0.2–1.2)
Total Protein: 6.8 g/dL (ref 6.0–8.3)

## 2022-04-19 LAB — TSH: TSH: 1.16 u[IU]/mL (ref 0.35–5.50)

## 2022-04-19 LAB — HEMOGLOBIN A1C: Hgb A1c MFr Bld: 5.9 % (ref 4.6–6.5)

## 2022-04-20 ENCOUNTER — Ambulatory Visit: Payer: Medicare HMO | Admitting: Family Medicine

## 2022-04-21 DIAGNOSIS — L562 Photocontact dermatitis [berloque dermatitis]: Secondary | ICD-10-CM | POA: Diagnosis not present

## 2022-04-22 ENCOUNTER — Telehealth: Payer: Self-pay | Admitting: Gastroenterology

## 2022-04-22 NOTE — Telephone Encounter (Signed)
Inbound call from patient requesting a call back to discuss Sulfasalazine. Patient stated it is not working the way it should and is wanting to see about going on a medication he has been on before. Please advise.

## 2022-04-22 NOTE — Telephone Encounter (Signed)
Patient called in to see if he could start back on the Mercaptopurine that he was previously on. He feels that the Sulfasalazine is not working as well as the other medication. He takes it BID as prescribed. He has been experiencing diarrhea every morning since switching medications, and a new rash to both arms that started on Monday 04/18/22. He was advised this week by his dermatologist to contact our office due to the possibility of a medication reaction. Will route to Dr. Fuller Plan.

## 2022-04-25 ENCOUNTER — Ambulatory Visit: Payer: Medicare HMO | Admitting: Family Medicine

## 2022-04-25 ENCOUNTER — Other Ambulatory Visit: Payer: Self-pay | Admitting: Gastroenterology

## 2022-04-25 NOTE — Telephone Encounter (Signed)
No answer on patient's phone. Left a message that Dr Fuller Plan agrees to give him the prescription for 6MP. Asked the patient to call us back and give the name of the pharmacy he wishes to use.

## 2022-04-27 NOTE — Telephone Encounter (Signed)
Spoke with patients daughter and they are in agreement with the medication asked for it to be sent.

## 2022-04-28 ENCOUNTER — Other Ambulatory Visit: Payer: Self-pay

## 2022-04-28 MED ORDER — MERCAPTOPURINE 50 MG PO TABS: 75.0000 mg | ORAL_TABLET | Freq: Every day | ORAL | 11 refills | Status: DC

## 2022-04-28 MED ORDER — MERCAPTOPURINE 50 MG PO TABS: 75.0000 mg | ORAL_TABLET | Freq: Every day | ORAL | 3 refills | Status: DC

## 2022-04-28 NOTE — Telephone Encounter (Signed)
Ladene Artist, MD    Since his dermatologist recommends to discontinue sulfasalazine and it doesn't seem to control his GI symptoms then resume 6 MP 75 mg po qd, 1 year of refills.      Prescription for Mercaptopurine (6-MP) 75 mg daily sent to the [facility].

## 2022-05-25 ENCOUNTER — Other Ambulatory Visit: Payer: Self-pay | Admitting: Family Medicine

## 2022-06-20 DIAGNOSIS — N401 Enlarged prostate with lower urinary tract symptoms: Secondary | ICD-10-CM | POA: Diagnosis not present

## 2022-06-20 DIAGNOSIS — R3915 Urgency of urination: Secondary | ICD-10-CM | POA: Diagnosis not present

## 2022-08-08 ENCOUNTER — Encounter: Payer: Self-pay | Admitting: *Deleted

## 2022-08-15 ENCOUNTER — Ambulatory Visit (INDEPENDENT_AMBULATORY_CARE_PROVIDER_SITE_OTHER): Payer: Medicare HMO

## 2022-08-15 VITALS — Wt 205.0 lb

## 2022-08-15 DIAGNOSIS — Z0000 Encounter for general adult medical examination without abnormal findings: Secondary | ICD-10-CM

## 2022-08-15 NOTE — Progress Notes (Signed)
Virtual Visit via Telephone Note  I connected with  [name] on 08/15/22 at  2:00 PM EDT by telephone and verified that I am speaking with the correct person using two identifiers.  Medicare Annual Wellness visit completed telephonically due to Covid-19 pandemic.   Persons participating in this call: This Health Coach and this patient.   Location: Patient: home  Provider: office    I discussed the limitations, risks, security and privacy concerns of performing an evaluation and management service by telephone and the availability of in person appointments. The patient expressed understanding and agreed to proceed.  Unable to perform video visit due to video visit attempted and failed and/or patient does not have video capability.   Some vital signs may be absent or patient reported.   Willette Brace, LPN   Subjective:   [name] is a 76 y.o. male who presents for Medicare Annual/Subsequent preventive examination.  Review of Systems     Cardiac Risk Factors include: advanced age (>60mn, >>45women);dyslipidemia;male gender;hypertension;obesity (BMI >30kg/m2)     Objective:    Today's Vitals   08/15/22 1355  Weight: 205 lb (93 kg)   Body mass index is 31.17 kg/m.     08/15/2022    2:00 PM 08/05/2021    1:54 PM 07/30/2020    2:38 PM 07/03/2019   12:01 PM 03/07/2018    2:01 PM 03/01/2018    8:28 AM 10/18/2016    1:29 PM  Advanced Directives  Does Patient Have a Medical Advance Directive? No Yes No No No No No  Does patient want to make changes to medical advance directive?  Yes (MAU/Ambulatory/Procedural Areas - Information given)       Would patient like information on creating a medical advance directive? No - Patient declined   Yes (MAU/Ambulatory/Procedural Areas - Information given) No - Patient declined      Current Medications (verified) Outpatient Encounter Medications as of 08/15/2022  Medication Sig   atenolol (TENORMIN) 25 MG tablet TAKE  (1/2) TABLET BY MOUTH ONCE DAILY.   atorvastatin (LIPITOR) 10 MG tablet TAKE 1 TABLET EVERY DAY   clopidogrel (PLAVIX) 75 MG tablet TAKE 1 TABLET EVERY DAY   fluticasone (CUTIVATE) 0.05 % cream Apply topically 2 (two) times daily as needed.   fluticasone (FLONASE) 50 MCG/ACT nasal spray INHALE 1 SPRAY INTO EACH NOSTRIL ONCE DAILY.   irbesartan-hydrochlorothiazide (AVALIDE) 150-12.5 MG tablet TAKE 2 TABLETS EVERY DAY   mercaptopurine (PURINETHOL) 50 MG tablet Take 1.5 tablets (75 mg total) by mouth daily. Give on an empty stomach 1 hour before or 2 hours after meals.   pantoprazole (PROTONIX) 40 MG tablet Take 1 tablet (40 mg total) by mouth daily.   tamsulosin (FLOMAX) 0.4 MG CAPS capsule Take 0.4 mg by mouth daily.    nitroGLYCERIN (NITROSTAT) 0.4 MG SL tablet PLACE 1 TAB UNDER TONGUE EVERY 5 MIN IF NEEDED FOR CHEST PAIN. MAY USE 3 TIMES.NO RELIEF CALL 911. (Patient not taking: Reported on 08/15/2022)   potassium chloride SA (KLOR-CON) 20 MEQ tablet Take 1 tablet as needed daily as instructed by physician (Patient not taking: Reported on 08/15/2022)   Facility-Administered Encounter Medications as of 08/15/2022  Medication   regadenoson (LEXISCAN) injection SOLN 0.4 mg    Allergies (verified) Penicillins and Sulfasalazine   History: Past Medical History:  Diagnosis Date   Adenomatous polyps 03/2004, 11/2012   Arthritis    Bradycardia 2018   [place]av[place]  Coronary atherosclerosis of unspecified type of vessel, native or graft    Esophagitis 1993   GERD (gastroesophageal reflux disease)    Hyperlipidemia    Hypertension    Myocardial infarction ([place]) 1993   Shingles 2019   Sleep apnea    cpap   Stroke ([place]) 02/2014   Trigger finger (acquired)    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   UTI (urinary tract infection) 2019   Past Surgical History:  Procedure Laterality Date   ANGIOPLASTY  1993   CHOLECYSTECTOMY  2002   [place]    ROTATOR CUFF REPAIR  2009   right   TRIGGER FINGER RELEASE     right   Family History  Problem Relation Age of Onset   Stroke Mother    COPD Father    Heart disease Sister        valve replacement   CAD Brother    Heart attack Brother    Colon cancer Neg Hx    Esophageal cancer Neg Hx    Liver disease Neg Hx    Diabetes Neg Hx    Rectal cancer Neg Hx    Stomach cancer Neg Hx    Social History   Socioeconomic History   Marital status: Widowed    Spouse name: Not on file   Number of children: 3   Years of education: Not on file   Highest education level: Not on file  Occupational History   Occupation: apartment maintenance    Employer: [employer]    Comment: retired  Tobacco Use   Smoking status: Former    Packs/day: 1.00    Years: 3.00    Total pack years: 3.00    Types: Cigarettes    Quit date: 11/14/1966    Years since quitting: 55.7   Smokeless tobacco: Former    Types: Chew   Tobacco comments:    quit 50 years ago  Vaping Use   Vaping Use: Never used  Substance and Sexual Activity   Alcohol use: No    Alcohol/week: 0.0 standard drinks of alcohol   Drug use: No   Sexual activity: Not on file  Other Topics Concern   Not on file  Social History Narrative   Widower January 2019.. 3 children- son is a patient here. 4 grandkids.       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 to work on so a little at a time, mow the kids yards, enjoys yardwork      Pt does not get regular exercise--daily caffeine use-5 cups daily   Social Determinants of Health   Financial Resource Strain: Low Risk  (08/15/2022)   Overall Financial Resource Strain (CARDIA)    Difficulty of Paying Living Expenses: Not hard at all  Food Insecurity: No Food Insecurity (08/15/2022)   Hunger Vital Sign    Worried About Running Out of Food in the Last Year: Never true    [place] in the Last Year: Never true   Transportation Needs: No Transportation Needs (08/15/2022)   PRAPARE - Hydrologist (Medical): No    Lack of Transportation (Non-Medical): No  Physical Activity: Insufficiently Active (08/15/2022)   Exercise Vital Sign    Days of Exercise per Week: 3 days    Minutes of Exercise per Session: 20 min  Stress: No Stress Concern Present (08/15/2022)   [place] -  Occupational Stress Questionnaire    Feeling of Stress : Not at all  Social Connections: Moderately Isolated (08/15/2022)   Social Connection and Isolation Panel [NHANES]    Frequency of Communication with Friends and Family: More than three times a week    Frequency of Social Gatherings with Friends and Family: Once a week    Attends Religious Services: More than 4 times per year    Active Member of [employer] or Organizations: No    Attends Archivist Meetings: Never    Marital Status: Widowed    Tobacco Counseling Counseling given: Not Answered Tobacco comments: quit 50 years ago   Clinical Intake:  Pre-visit preparation completed: Yes  Pain : No/denies pain     BMI - recorded: 31.17 Nutritional Status: BMI > 30  Obese Nutritional Risks: None Diabetes: No  How often do you need to have someone help you when you read instructions, pamphlets, or other written materials from your doctor or pharmacy?: 1 - Never  Diabetic?no  Interpreter Needed?: No  Information entered by :: Charlott Rakes, LPN   Activities of Daily Living    08/15/2022    2:01 PM  In your present state of health, do you have any difficulty performing the following activities:  Hearing? 0  Vision? 0  Difficulty concentrating or making decisions? 0  Walking or climbing stairs? 0  Dressing or bathing? 0  Doing errands, shopping? 0  Preparing Food and eating ? N  Using the Toilet? N  In the past six months, have you accidently leaked urine? N  Do you have problems with loss of  bowel control? N  Managing your Medications? N  Managing your Finances? N  Housekeeping or managing your Housekeeping? N    Patient Care Team: Marin Olp, MD as PCP - General (Family Medicine) Fay Records, MD as PCP - Cardiology (Cardiology) Madelin Rear, [facility] (Inactive) as Pharmacist (Pharmacist)  Indicate any recent Medical Services you may have received from other than [facility] providers in the past year (date may be approximate).     Assessment:   This is a routine wellness examination for [name].  Hearing/Vision screen Hearing Screening - Comments:: Pt stated HOH  Vision Screening - Comments:: Pt follows up with [facility] for annual eye exams   Dietary issues and exercise activities discussed: Current Exercise Habits: Home exercise routine, Type of exercise: walking, Time (Minutes): 20, Frequency (Times/Week): 3, Weekly Exercise (Minutes/Week): 60   Goals Addressed             This Visit's Progress    Patient Stated       Exercise more        Depression Screen    08/15/2022    1:59 PM 04/18/2022    2:06 PM 08/05/2021    1:51 PM 04/20/2021    9:42 AM 07/30/2020    2:37 PM 04/16/2020    1:23 PM 10/16/2019    9:13 AM  PHQ 2/9 Scores  PHQ - 2 Score 0 0 1 0 0 0 0  PHQ- 9 Score    1  0     Fall Risk    08/15/2022    2:01 PM 04/18/2022    2:06 PM 08/05/2021    1:55 PM 04/20/2021    9:42 AM 07/30/2020    2:40 PM  Fall Risk   Falls in the past year? 0 0 0 0 0  Number falls in past yr: 0 0 0 0 0  Injury  with Fall? 0 0 0 0 0  Risk for fall due to : Impaired vision No Fall Risks Impaired vision  Impaired balance/gait;Impaired mobility;Impaired vision  Follow up Falls prevention discussed  Falls prevention discussed  Falls prevention discussed    FALL RISK PREVENTION PERTAINING TO THE HOME:  Any stairs in or around the home? No  If so, are there any without handrails? No  Home free of loose throw rugs in walkways, pet beds, electrical cords, etc? Yes  Adequate  lighting in your home to reduce risk of falls? Yes   ASSISTIVE DEVICES UTILIZED TO PREVENT FALLS:  Life alert? No  Use of a cane, walker or w/c? No  Grab bars in the bathroom? No  Shower chair or bench in shower? No  Elevated toilet seat or a handicapped toilet? No   TIMED UP AND GO:  Was the test performed? No .  Cognitive Function:    10/18/2016    1:34 PM  MMSE - Mini Mental State Exam  Orientation to time 5  Orientation to Place 5  Registration 3  Attention/ Calculation 5  Recall 3  Language- name 2 objects 2  Language- repeat 1  Language- follow 3 step command 3  Language- read & follow direction 1  Write a sentence 1  Copy design 1  Total score 30        08/15/2022    2:02 PM 08/05/2021    1:56 PM 07/30/2020    2:42 PM  6CIT Screen  What Year? 0 points 0 points 0 points  What month? 0 points 0 points 0 points  What time? 0 points 0 points   Count back from 20 0 points 0 points 0 points  Months in reverse 2 points 0 points 0 points  Repeat phrase 0 points 0 points 0 points  Total Score 2 points 0 points     Immunizations Immunization History  Administered Date(s) Administered   Fluad Quad(high Dose 65+) 08/31/2020, 09/13/2021   Influenza Split 08/19/2011, 08/17/2012   Influenza Whole 10/23/2007, 08/14/2009, 08/13/2010, 08/23/2019   Influenza, High Dose Seasonal PF 09/13/2016, 09/01/2017, 08/22/2018, 08/21/2019   Influenza,inj,Quad PF,6+ Mos 08/16/2013   Influenza-Unspecified 08/28/2014, 09/07/2015, 09/01/2017, 09/13/2018   Moderna Sars-Covid-2 Vaccination 12/27/2019, 01/24/2020, 10/04/2020, 06/25/2021   Pneumococcal Conjugate-13 06/08/2015   Pneumococcal Polysaccharide-23 04/25/2014   Td 11/14/2000, 09/21/2010   Zoster Recombinat (Shingrix) 09/24/2018    TDAP status: Due, Education has been provided regarding the importance of this vaccine. Advised may receive this vaccine at local pharmacy or Health Dept. Aware to provide a copy of the vaccination  record if obtained from local pharmacy or Health Dept. Verbalized acceptance and understanding.  Flu Vaccine status: Due, Education has been provided regarding the importance of this vaccine. Advised may receive this vaccine at local pharmacy or Health Dept. Aware to provide a copy of the vaccination record if obtained from local pharmacy or Health Dept. Verbalized acceptance and understanding.  Pneumococcal vaccine status: Up to date  Covid-19 vaccine status: Completed vaccines  Qualifies for Shingles Vaccine? Yes   Zostavax completed Yes   Shingrix Completed?: No.    Education has been provided regarding the importance of this vaccine. Patient has been advised to call insurance company to determine out of pocket expense if they have not yet received this vaccine. Advised may also receive vaccine at local pharmacy or Health Dept. Verbalized acceptance and understanding.  Screening Tests Health Maintenance  Topic Date Due   Zoster Vaccines- Shingrix (2 of 2)  11/19/2018   TETANUS/TDAP  09/21/2020   INFLUENZA VACCINE  06/14/2022   COLONOSCOPY (Pts 45-76yr Insurance coverage will need to be confirmed)  04/28/2024   Pneumonia Vaccine 75 Years old  Completed   Hepatitis C Screening  Completed   HPV VACCINES  Aged Out   COVID-19 Vaccine  Discontinued    Health Maintenance  Health Maintenance Due  Topic Date Due   Zoster Vaccines- Shingrix (2 of 2) 11/19/2018   TETANUS/TDAP  09/21/2020   INFLUENZA VACCINE  06/14/2022    Colorectal cancer screening: Type of screening: Colonoscopy. Completed 04/28/21. Repeat every 3 years   Additional Screening:  Hepatitis C Screening:  Completed 09/13/16  Vision Screening: Recommended annual ophthalmology exams for early detection of glaucoma and other disorders of the eye. Is the patient up to date with their annual eye exam?  Yes  Who is the provider or what is the name of the office in which the patient attends annual eye exams? Dr DBing Plume If  pt is not established with a provider, would they like to be referred to a provider to establish care? No .   Dental Screening: Recommended annual dental exams for proper oral hygiene  Community Resource Referral / Chronic Care Management: CRR required this visit?  No   CCM required this visit?  No      Plan:     I have personally reviewed and noted the following in the patient's chart:   Medical and social history Use of alcohol, tobacco or illicit drugs  Current medications and supplements including opioid prescriptions. Patient is not currently taking opioid prescriptions. Functional ability and status Nutritional status Physical activity Advanced directives List of other physicians Hospitalizations, surgeries, and ER visits in previous 12 months Vitals Screenings to include cognitive, depression, and falls Referrals and appointments  In addition, I have reviewed and discussed with patient certain preventive protocols, quality metrics, and best practice recommendations. A written personalized care plan for preventive services as well as general preventive health recommendations were provided to patient.     TWillette Brace LPN   [id number]  Nurse Notes: Pt stated he has had sniffles for a few days and will call for an appt  if it continues,

## 2022-08-19 ENCOUNTER — Telehealth: Payer: Self-pay | Admitting: Pharmacist

## 2022-08-19 NOTE — Progress Notes (Signed)
Chronic Care Management Pharmacy Assistant   Name: [name]  MRN: [id number] DOB: [birth year]   Reason for Encounter: General Adherence Call    Recent office visits:  04/18/2022 [facility] (PCP) [name] Olp, MD; no medication changes indicated.  Recent consult visits:  None  Hospital visits:  None in previous 6 months  Medications: Outpatient Encounter Medications as of 08/19/2022  Medication Sig   atenolol (TENORMIN) 25 MG tablet TAKE (1/2) TABLET BY MOUTH ONCE DAILY.   atorvastatin (LIPITOR) 10 MG tablet TAKE 1 TABLET EVERY DAY   clopidogrel (PLAVIX) 75 MG tablet TAKE 1 TABLET EVERY DAY   fluticasone (CUTIVATE) 0.05 % cream Apply topically 2 (two) times daily as needed.   fluticasone (FLONASE) 50 MCG/ACT nasal spray INHALE 1 SPRAY INTO EACH NOSTRIL ONCE DAILY.   irbesartan-hydrochlorothiazide (AVALIDE) 150-12.5 MG tablet TAKE 2 TABLETS EVERY DAY   mercaptopurine (PURINETHOL) 50 MG tablet Take 1.5 tablets (75 mg total) by mouth daily. Give on an empty stomach 1 hour before or 2 hours after meals.   nitroGLYCERIN (NITROSTAT) 0.4 MG SL tablet PLACE 1 TAB UNDER TONGUE EVERY 5 MIN IF NEEDED FOR CHEST PAIN. MAY USE 3 TIMES.NO RELIEF CALL 911. (Patient not taking: Reported on 08/15/2022)   pantoprazole (PROTONIX) 40 MG tablet Take 1 tablet (40 mg total) by mouth daily.   potassium chloride SA (KLOR-CON) 20 MEQ tablet Take 1 tablet as needed daily as instructed by physician (Patient not taking: Reported on 08/15/2022)   tamsulosin (FLOMAX) 0.4 MG CAPS capsule Take 0.4 mg by mouth daily.    Facility-Administered Encounter Medications as of 08/19/2022  Medication   regadenoson (LEXISCAN) injection SOLN 0.4 mg   Contacted [name] for General Review Call   Chart Review:  Have there been any documented new, changed, or discontinued medications since last visit? No  Has there been any documented recent hospitalizations or ED visits since last visit with Clinical  Pharmacist? No   Adherence Review:  Does the Clinical Pharmacist Assistant have access to adherence rates? Yes Adherence rates for STAR metric medications: Atorvastatin 10 mg last filled 07/29/2022 90 DS Irbesartan/HCTZ 150-12.5 mg last filled 05/24/2022 90 DS Does the patient have >5 day gap between last estimated fill dates for any of the above medications or other medication gaps? No Reason for medication gaps.   Disease State Questions:  Able to connect with Patient? Yes Did patient have any problems with their health recently? No Have you had any admissions or emergency room visits or worsening of your condition(s) since last visit? No Have you had any visits with new specialists or providers since your last visit? No Have you had any new health care problem(s) since your last visit? No Have you run out of any of your medications since you last spoke with clinical pharmacist? No Are there any medications you are not taking as prescribed? No Are you having any issues or side effects with your medications? No Do you have any other health concerns or questions you want to discuss with your Clinical Pharmacist before your next visit? No Are there any health concerns that you feel we can do a better job addressing? No Are you having any problems with any of the following since the last visit: (select all that apply)  None 12. Any falls since last visit? No 13. Any increased or uncontrolled pain since last visit? No    -Patient scheduled a follow up telephone visit with clinical pharmacist on 09/26/2022.  Care Gaps: Medicare Annual Wellness: Completed 08/15/2022 Hemoglobin A1C: 5.9% on 04/18/2022 Colonoscopy: Next due on 04/28/2024  Future Appointments  Date Time Provider [place]  09/26/2022  9:00 AM [facility] CCM PHARMACIST [facility]  11/04/2022  9:00 AM [name] Olp, MD [facility]  08/21/2023  2:00 PM [facility] HEALTH COACH [facility]   Star Rating  Drugs: Atorvastatin 10 mg last filled 07/29/2022 90 DS Irbesartan/HCTZ 150-12.5 mg last filled 05/24/2022 90 DS  [name], [place] Pharmacist Assistant [phone]

## 2022-09-15 NOTE — Progress Notes (Signed)
Chronic Care Management Pharmacy Note  09/30/2022 Name:  [name] MRN:  [id number] DOB:  09-17-1946  Summary: PharmD FU visit.  Patient is doing very well, no concerns with medication cost/adherence.  All chronic conditions are controlled.  Recommendations/Changes made from today's visit: None  Plan: FU 1 year   Subjective: [name] is an 76 y.o. year old male who is a primary patient of Yong Channel, Brayton Mars, MD.  The CCM team was consulted for assistance with disease management and care coordination needs.    Engaged with patient by telephone for follow up visit in response to provider referral for pharmacy case management and/or care coordination services.   Consent to Services:  The patient was given the following information about Chronic Care Management services today, agreed to services, and gave verbal consent: 1. CCM service includes personalized support from designated clinical staff supervised by the primary care provider, including individualized plan of care and coordination with other care providers 2. 24/7 contact phone numbers for assistance for urgent and routine care needs. 3. Service will only be billed when office clinical staff spend 20 minutes or more in a month to coordinate care. 4. Only one practitioner may furnish and bill the service in a calendar month. 5.The patient may stop CCM services at any time (effective at the end of the month) by phone call to the office staff. 6. The patient will be responsible for cost sharing (co-pay) of up to 20% of the service fee (after annual deductible is met). Patient agreed to services and consent obtained.  Patient Care Team: Marin Olp, MD as PCP - General (Family Medicine) Fay Records, MD as PCP - Cardiology (Cardiology) Madelin Rear, [facility] (Inactive) as Pharmacist (Pharmacist) Edythe Clarity, [facility] (Pharmacist)  Recent office visits:  04/18/2022 OV (PCP) Marin Olp, MD; no medication  changes indicated.   Recent consult visits:  None   Hospital visits:  None in previous 6 months   Objective:  Lab Results  Component Value Date   CREATININE 1.33 04/18/2022   BUN 13 04/18/2022   GFR 52.49 (L) 04/18/2022   GFRNONAA 58 (L) 10/19/2020   GFRAA 68 10/19/2020   NA 138 04/18/2022   K 3.4 (L) 04/18/2022   CALCIUM 9.4 04/18/2022   CO2 30 04/18/2022   GLUCOSE 81 04/18/2022    Lab Results  Component Value Date/Time   HGBA1C 5.9 04/18/2022 03:09 PM   HGBA1C 5.9 10/25/2021 11:30 AM   GFR 52.49 (L) 04/18/2022 03:09 PM   GFR 54.13 (L) 10/25/2021 11:30 AM    Last diabetic Eye exam: No results found for: "HMDIABEYEEXA"  Last diabetic Foot exam: No results found for: "HMDIABFOOTEX"   Lab Results  Component Value Date   CHOL 116 10/25/2021   HDL 31.40 (L) 10/25/2021   LDLCALC 63 10/25/2021   TRIG 107.0 10/25/2021   CHOLHDL 4 10/25/2021       Latest Ref Rng & Units 04/18/2022    3:09 PM 10/25/2021   11:30 AM 04/20/2021   10:14 AM  Hepatic Function  Total Protein 6.0 - 8.3 g/dL 6.8  6.9  6.5   Albumin 3.5 - 5.2 g/dL 3.9  4.0  3.7   AST 0 - 37 U/L _0 ALT 0 - 53 U/L _1 Alk Phosphatase 39 - 117 U/L 73  56  59   Total Bilirubin 0.2 - 1.2 mg/dL 0.5  1.1  0.7     Lab Results  Component Value Date/Time   TSH 1.16 04/18/2022 03:09 PM   TSH 0.95 03/20/2017 11:41 AM       Latest Ref Rng & Units 04/18/2022    3:09 PM 10/25/2021   11:30 AM 04/20/2021   10:14 AM  CBC  WBC 4.0 - 10.5 K/uL 6.9  6.5  7.6   Hemoglobin 13.0 - 17.0 g/dL 14.9  15.7  15.4   Hematocrit 39.0 - 52.0 % 43.6  46.9  45.3   Platelets 150.0 - 400.0 K/uL 183.0  237.0  206.0     No results found for: "VD25OH"  Clinical ASCVD: Yes  The ASCVD Risk score (Arnett DK, et al., 2019) failed to calculate for the following reasons:   The patient has a prior MI or stroke diagnosis       08/15/2022    1:59 PM 04/18/2022    2:06 PM 08/05/2021    1:51 PM  Depression screen PHQ 2/9   Decreased Interest 0 0 0  Down, Depressed, Hopeless 0 0 1  PHQ - 2 Score 0 0 1      Social History   Tobacco Use  Smoking Status Former   Packs/day: 1.00   Years: 3.00   Total pack years: 3.00   Types: Cigarettes   Quit date: 11/14/1966   Years since quitting: 55.9  Smokeless Tobacco Former   Types: Chew  Tobacco Comments   quit 50 years ago   BP Readings from Last 3 Encounters:  04/18/22 122/71  02/16/22 128/80  01/31/22 122/72   Pulse Readings from Last 3 Encounters:  04/18/22 (!) 56  02/16/22 (!) 56  01/31/22 (!) 54   Wt Readings from Last 3 Encounters:  08/15/22 205 lb (93 kg)  04/18/22 205 lb 9.6 oz (93.3 kg)  02/16/22 206 lb (93.4 kg)   BMI Readings from Last 3 Encounters:  08/15/22 31.17 kg/m  04/18/22 31.26 kg/m  02/16/22 31.32 kg/m    Assessment/Interventions: Review of patient past medical history, allergies, medications, health status, including review of consultants reports, laboratory and other test data, was performed as part of comprehensive evaluation and provision of chronic care management services.   SDOH:  (Social Determinants of Health) assessments and interventions performed: No Financial Resource Strain: Low Risk  (08/15/2022)   Overall Financial Resource Strain (CARDIA)    Difficulty of Paying Living Expenses: Not hard at all   Food Insecurity: No Food Insecurity (08/15/2022)   Hunger Vital Sign    Worried About Running Out of Food in the Last Year: Never true    Ran Out of Food in the Last Year: Never true    SDOH Interventions    Flowsheet Row Clinical Support from 08/15/2022 in [place] Interventions Intervention Not Indicated  Housing Interventions Intervention Not Indicated  Transportation Interventions Intervention Not Indicated  Financial Strain Interventions Intervention Not Indicated  Physical Activity Interventions Intervention Not Indicated  Stress  Interventions Intervention Not Indicated  Social Connections Interventions Intervention Not Indicated      SDOH Screenings   Food Insecurity: No Food Insecurity (08/15/2022)  Housing: Low Risk  (08/15/2022)  Transportation Needs: No Transportation Needs (08/15/2022)  Depression (PHQ2-9): Low Risk  (08/15/2022)  Financial Resource Strain: Low Risk  (08/15/2022)  Physical Activity: Insufficiently Active (08/15/2022)  Social Connections: Moderately Isolated (08/15/2022)  Stress: No Stress Concern Present (08/15/2022)  Tobacco Use: Medium Risk (08/15/2022)    CCM Care  Plan  Allergies  Allergen Reactions   Penicillins Rash    REACTION: rash   Sulfasalazine Rash    Medications Reviewed Today     Reviewed by Edythe Clarity, [facility] (Pharmacist) on 09/30/22 at 1138  Med List Status: <None>   Medication Order Taking? Sig Documenting Provider Last Dose Status Informant  atenolol (TENORMIN) 25 MG tablet [id number] Yes TAKE (1/2) TABLET BY MOUTH ONCE DAILY. Marin Olp, MD Taking Active   atorvastatin (LIPITOR) 10 MG tablet [id number] Yes TAKE 1 TABLET EVERY DAY Marin Olp, MD Taking Active   clopidogrel (PLAVIX) 75 MG tablet [id number] Yes TAKE 1 TABLET EVERY DAY Marin Olp, MD Taking Active   fluticasone (CUTIVATE) 0.05 % cream [id number] Yes Apply topically 2 (two) times daily as needed. [provider] Taking Active   fluticasone (FLONASE) 50 MCG/ACT nasal spray [id number] Yes INHALE 1 SPRAY INTO EACH NOSTRIL ONCE DAILY. Marin Olp, MD Taking Active   irbesartan-hydrochlorothiazide (AVALIDE) 150-12.5 MG tablet [id number] Yes TAKE 2 TABLETS EVERY DAY Marin Olp, MD Taking Active   mercaptopurine (PURINETHOL) 50 MG tablet [id number] Yes Take 1.5 tablets (75 mg total) by mouth daily. Give on an empty stomach 1 hour before or 2 hours after meals. Ladene Artist, MD Taking Active   nitroGLYCERIN (NITROSTAT) 0.4 MG SL tablet [id number] Yes PLACE 1 TAB UNDER TONGUE  EVERY 5 MIN IF NEEDED FOR CHEST PAIN. MAY USE 3 TIMES.NO RELIEF CALL 911. Marin Olp, MD Taking Active   pantoprazole (PROTONIX) 40 MG tablet [id number] Yes Take 1 tablet (40 mg total) by mouth daily. Ladene Artist, MD Taking Active   potassium chloride SA (KLOR-CON) 20 MEQ tablet [id number] Yes Take 1 tablet as needed daily as instructed by physician Marin Olp, MD Taking Active   regadenoson [facility]) injection SOLN 0.4 mg [id number]   Tobb, Godfrey Pick, DO  Active   tamsulosin (FLOMAX) 0.4 MG CAPS capsule [id number] Yes Take 0.4 mg by mouth daily.  [provider] Taking Active             Patient Active Problem List   Diagnosis Date Noted   Aortic atherosclerosis ([place]) 04/20/2021   Actinic keratosis 10/10/2018   Postherpetic neuralgia 03/19/2018   Balanitis 03/06/2017   OA (osteoarthritis) of knee 12/07/2015   Former smoker 06/08/2015   Hyperglycemia 06/08/2015   Edema 02/04/2015   Allergic rhinitis 10/24/2014   CKD (chronic kidney disease), stage III ([place]) 10/24/2014   Obesity (BMI 30-39.9) 04/25/2014   Right cervical radiculopathy 04/12/2013   ULCERATIVE COLITIS-LEFT SIDE 07/03/2008   Sleep apnea 07/02/2008   Low back pain 01/11/2008   GERD 11/07/2007   Trigger finger, acquired 10/23/2007   CAD (coronary artery disease) 06/15/2007   Hyperlipidemia 06/14/2007   Essential hypertension 06/14/2007   History of colonic polyps 06/14/2007    Immunization History  Administered Date(s) Administered   Fluad Quad(high Dose 65+) 08/31/2020, 09/13/2021   Influenza Split 08/19/2011, 08/17/2012   Influenza Whole 10/23/2007, 08/14/2009, 08/13/2010, 08/23/2019   Influenza, High Dose Seasonal PF 09/13/2016, 09/01/2017, 08/22/2018, 08/21/2019   Influenza,inj,Quad PF,6+ Mos 08/16/2013   Influenza-Unspecified 08/28/2014, 09/07/2015, 09/01/2017, 09/13/2018   Moderna Sars-Covid-2 Vaccination 12/27/2019, 01/24/2020, 10/04/2020, 06/25/2021   Pneumococcal Conjugate-13  06/08/2015   Pneumococcal Polysaccharide-23 04/25/2014   Td 11/14/2000, 09/21/2010   Zoster Recombinat (Shingrix) 09/24/2018    Conditions to be addressed/monitored:  HTN, CAD, HLD  Care Plan : General Pharmacy (Adult)  Updates made by Edythe Clarity, RPH since 09/30/2022 12:00  AM     Problem: CAF, HTN ,HLD   Priority: High  Onset Date: 09/30/2022     Long-Range Goal: Patient Stated   This Visit's Progress: On track  Note:   Current Barriers:  None currently  Pharmacist Clinical Goal(s):  Patient will verbalize ability to afford treatment regimen maintain control of BP, LDL as evidenced by labs  through collaboration with PharmD and provider.   Interventions: 1:1 collaboration with Marin Olp, MD regarding development and update of comprehensive plan of care as evidenced by provider attestation and co-signature Inter-disciplinary care team collaboration (see longitudinal plan of care) Comprehensive medication review performed; medication list updated in electronic medical record  Hypertension (BP goal <130/80) 09/30/22 -Controlled -Current treatment: Irbesartan 150-12.66m Appropriate, Effective, Safe, Accessible Atenolol 12.537mdaily Appropriate, Effective, Safe, Accessible -Medications previously tried: none noted  -Current home readings: controlled per patient -Denies hypotensive/hypertensive symptoms -Educated on BP goals and benefits of medications for prevention of heart attack, stroke and kidney damage; -Counseled to monitor BP at home a few times per week, document, and provide log at future appointments -Recommended to continue current medication BP controlled in office, no changes needed at this time.  Hyperlipidemia/CAD: (LDL goal < 70) -Controlled -Current treatment: Atorvastatin 1052mppropriate, Effective, Safe, Accessible -Medications previously tried: none noted  -Current dietary patterns: see previous -Current exercise habits: see  previous, unchanged -Educated on Cholesterol goals;  Benefits of statin for ASCVD risk reduction; Importance of limiting foods high in cholesterol; -Recommended to continue current medication Most recent LDL is 63 - at goal for patient with CAD. No changes, tolerating well.  Patient Goals/Self-Care Activities Patient will:  - take medications as prescribed as evidenced by patient report and record review focus on medication adherence by pill box  Follow Up Plan: The care management team will reach out to the patient again over the next 365 days.       Medication Assistance: None required.  Patient affirms current coverage meets needs.  Compliance/Adherence/Medication fill history: Care Gaps: None noted  Star-Rating Drugs: Atorvastatin 75m25m/15/23  Patient's preferred pharmacy is:  [facility] [address] [place] [id number]: [phone]: [phone]: [phone]: [phone] -[place] [address] [address]: [phone]: [phone] discussed: Benefits of medication synchronization, packaging and delivery as well as enhanced pharmacist oversight with [employer]. Patient decided to: Continue current medication management strategy  Care Plan and Follow Up Patient Decision:  Patient agrees to Care Plan and Follow-up.  Plan: The care management team will reach out to the patient again over the next 365 days.  ChriBeverly MilcharmD Clinical Pharmacist  [phone]

## 2022-09-21 ENCOUNTER — Other Ambulatory Visit: Payer: Self-pay | Admitting: Family Medicine

## 2022-09-26 ENCOUNTER — Telehealth: Payer: Self-pay | Admitting: Family Medicine

## 2022-09-26 ENCOUNTER — Ambulatory Visit: Payer: Medicare HMO | Admitting: Pharmacist

## 2022-09-26 DIAGNOSIS — I10 Essential (primary) hypertension: Secondary | ICD-10-CM

## 2022-09-26 DIAGNOSIS — E785 Hyperlipidemia, unspecified: Secondary | ICD-10-CM

## 2022-09-26 DIAGNOSIS — I2510 Atherosclerotic heart disease of native coronary artery without angina pectoris: Secondary | ICD-10-CM

## 2022-09-26 NOTE — Telephone Encounter (Signed)
Received a call from pharmacist but did not answer due to it stating possible Spam Risk. Asking for a call back.

## 2022-09-26 NOTE — Telephone Encounter (Signed)
Thanks!  Will return call later this morning.

## 2022-09-26 NOTE — Chronic Care Management (AMB) (Signed)
Called and LVM again, if he calls back please have him call [phone]. Thanks!

## 2022-09-30 NOTE — Patient Instructions (Addendum)
Visit Information   Goals Addressed             This Visit's Progress    Track and Manage My Blood Pressure-Hypertension       Timeframe:  Long-Range Goal Priority:  High Start Date: 09/30/22                            Expected End Date: 03/31/23                       Follow Up Date 12/31/22    - check blood pressure weekly    Why is this important?   You won't feel high blood pressure, but it can still hurt your blood vessels.  High blood pressure can cause heart or kidney problems. It can also cause a stroke.  Making lifestyle changes like losing a little weight or eating less salt will help.  Checking your blood pressure at home and at different times of the day can help to control blood pressure.  If the doctor prescribes medicine remember to take it the way the doctor ordered.  Call the office if you cannot afford the medicine or if there are questions about it.     Notes:        Patient Care Plan: General Pharmacy (Adult)     Problem Identified: CAF, HTN ,HLD   Priority: High  Onset Date: 09/30/2022     Long-Range Goal: Patient Stated   This Visit's Progress: On track  Note:   Current Barriers:  None currently  Pharmacist Clinical Goal(s):  Patient will verbalize ability to afford treatment regimen maintain control of BP, LDL as evidenced by labs  through collaboration with PharmD and provider.   Interventions: 1:1 collaboration with Marin Olp, MD regarding development and update of comprehensive plan of care as evidenced by provider attestation and co-signature Inter-disciplinary care team collaboration (see longitudinal plan of care) Comprehensive medication review performed; medication list updated in electronic medical record  Hypertension (BP goal <130/80) 09/30/22 -Controlled -Current treatment: Irbesartan 150-12.56m Appropriate, Effective, Safe, Accessible Atenolol 12.553mdaily Appropriate, Effective, Safe, Accessible -Medications  previously tried: none noted  -Current home readings: controlled per patient -Denies hypotensive/hypertensive symptoms -Educated on BP goals and benefits of medications for prevention of heart attack, stroke and kidney damage; -Counseled to monitor BP at home a few times per week, document, and provide log at future appointments -Recommended to continue current medication BP controlled in office, no changes needed at this time.  Hyperlipidemia/CAD: (LDL goal < 70) -Controlled -Current treatment: Atorvastatin 1033mppropriate, Effective, Safe, Accessible -Medications previously tried: none noted  -Current dietary patterns: see previous -Current exercise habits: see previous, unchanged -Educated on Cholesterol goals;  Benefits of statin for ASCVD risk reduction; Importance of limiting foods high in cholesterol; -Recommended to continue current medication Most recent LDL is 63 - at goal for patient with CAD. No changes, tolerating well.  Patient Goals/Self-Care Activities Patient will:  - take medications as prescribed as evidenced by patient report and record review focus on medication adherence by pill box  Follow Up Plan: The care management team will reach out to the patient again over the next 365 days.       The patient verbalized understanding of instructions, educational materials, and care plan provided today and DECLINED offer to receive copy of patient instructions, educational materials, and care plan.  Telephone follow up appointment with pharmacy team member  scheduled for: 1 year  Edythe Clarity, [place], PharmD Clinical Pharmacist  [facility] [phone]

## 2022-10-17 ENCOUNTER — Other Ambulatory Visit: Payer: Self-pay | Admitting: Family Medicine

## 2022-10-27 ENCOUNTER — Encounter: Payer: Self-pay | Admitting: *Deleted

## 2022-10-31 DIAGNOSIS — H2181 Floppy iris syndrome: Secondary | ICD-10-CM | POA: Diagnosis not present

## 2022-10-31 DIAGNOSIS — H2513 Age-related nuclear cataract, bilateral: Secondary | ICD-10-CM | POA: Diagnosis not present

## 2022-10-31 DIAGNOSIS — H4322 Crystalline deposits in vitreous body, left eye: Secondary | ICD-10-CM | POA: Diagnosis not present

## 2022-10-31 DIAGNOSIS — H35033 Hypertensive retinopathy, bilateral: Secondary | ICD-10-CM | POA: Diagnosis not present

## 2022-10-31 DIAGNOSIS — H524 Presbyopia: Secondary | ICD-10-CM | POA: Diagnosis not present

## 2022-11-04 ENCOUNTER — Encounter: Payer: Self-pay | Admitting: Family Medicine

## 2022-11-04 ENCOUNTER — Other Ambulatory Visit (INDEPENDENT_AMBULATORY_CARE_PROVIDER_SITE_OTHER): Payer: Medicare HMO

## 2022-11-04 ENCOUNTER — Ambulatory Visit (INDEPENDENT_AMBULATORY_CARE_PROVIDER_SITE_OTHER): Payer: Medicare HMO | Admitting: Family Medicine

## 2022-11-04 VITALS — BP 136/82 | HR 63 | Temp 98.1°F | Ht 68.0 in | Wt 209.1 lb

## 2022-11-04 DIAGNOSIS — I2510 Atherosclerotic heart disease of native coronary artery without angina pectoris: Secondary | ICD-10-CM

## 2022-11-04 DIAGNOSIS — Z0000 Encounter for general adult medical examination without abnormal findings: Secondary | ICD-10-CM

## 2022-11-04 DIAGNOSIS — M542 Cervicalgia: Secondary | ICD-10-CM

## 2022-11-04 DIAGNOSIS — R739 Hyperglycemia, unspecified: Secondary | ICD-10-CM

## 2022-11-04 DIAGNOSIS — M47812 Spondylosis without myelopathy or radiculopathy, cervical region: Secondary | ICD-10-CM | POA: Diagnosis not present

## 2022-11-04 LAB — COMPREHENSIVE METABOLIC PANEL
ALT: 37 U/L (ref 0–53)
AST: 24 U/L (ref 0–37)
Albumin: 4 g/dL (ref 3.5–5.2)
Alkaline Phosphatase: 57 U/L (ref 39–117)
BUN: 15 mg/dL (ref 6–23)
CO2: 32 mEq/L (ref 19–32)
Calcium: 9.5 mg/dL (ref 8.4–10.5)
Chloride: 101 mEq/L (ref 96–112)
Creatinine, Ser: 1.18 mg/dL (ref 0.40–1.50)
GFR: 60.37 mL/min (ref >60.00)
Glucose, Bld: 95 mg/dL (ref 70–99)
Potassium: 3.5 mEq/L (ref 3.5–5.1)
Sodium: 140 mEq/L (ref 135–145)
Total Bilirubin: 0.8 mg/dL (ref 0.2–1.2)
Total Protein: 6.8 g/dL (ref 6.0–8.3)

## 2022-11-04 LAB — CBC WITH DIFFERENTIAL/PLATELET
Basophils Absolute: 0.1 10*3/uL (ref 0.0–0.1)
Basophils Relative: 0.9 % (ref 0.0–3.0)
Eosinophils Absolute: 0.3 10*3/uL (ref 0.0–0.7)
Eosinophils Relative: 3.7 % (ref 0.0–5.0)
HCT: 45.5 % (ref 39.0–52.0)
Hemoglobin: 15.9 g/dL (ref 13.0–17.0)
Lymphocytes Relative: 31.6 % (ref 12.0–46.0)
Lymphs Abs: 2.3 10*3/uL (ref 0.7–4.0)
MCHC: 34.8 g/dL (ref 30.0–36.0)
MCV: 90.1 fl (ref 78.0–100.0)
Monocytes Absolute: 0.8 10*3/uL (ref 0.1–1.0)
Monocytes Relative: 10.8 % (ref 3.0–12.0)
Neutro Abs: 3.8 10*3/uL (ref 1.4–7.7)
Neutrophils Relative %: 53 % (ref 43.0–77.0)
Platelets: 220 10*3/uL (ref 150.0–400.0)
RBC: 5.05 Mil/uL (ref 4.22–5.81)
RDW: 16.2 % — ABNORMAL HIGH (ref 11.5–15.5)
WBC: 7.1 10*3/uL (ref 4.0–10.5)

## 2022-11-04 LAB — LIPID PANEL
Cholesterol: 110 mg/dL (ref 0–200)
HDL: 31.8 mg/dL — ABNORMAL LOW (ref >39.00)
LDL Cholesterol: 57 mg/dL (ref 0–99)
NonHDL: 78.33
Total CHOL/HDL Ratio: 3
Triglycerides: 108 mg/dL (ref 0.0–149.0)
VLDL: 21.6 mg/dL (ref 0.0–40.0)

## 2022-11-04 LAB — HEMOGLOBIN A1C: Hgb A1c MFr Bld: 5.8 % (ref 4.6–6.5)

## 2022-11-04 NOTE — Progress Notes (Signed)
Phone: [phone]   Subjective:  Patient presents today for their annual physical. Chief complaint-noted.   See problem oriented charting- ROS- full  review of systems was completed and negative  except for: posterior neck pain on right, runny nose  The following were reviewed and entered/updated in epic: Past Medical History:  Diagnosis Date   Adenomatous polyps 03/2004, 11/2012   Arthritis    Bradycardia 2018   Clotting disorder ([place])    on plavix   Coronary atherosclerosis of unspecified type of vessel, native or graft    Esophagitis 1993   GERD (gastroesophageal reflux disease)    Hyperlipidemia    Hypertension    Myocardial infarction ([place]) 1993   Shingles 2019   Sleep apnea    cpap   Stroke ([place]) 02/2014   Trigger finger (acquired)    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   UTI (urinary tract infection) 2019   Patient Active Problem List   Diagnosis Date Noted   ULCERATIVE COLITIS-LEFT SIDE 07/03/2008    Priority: High   CAD (coronary artery disease) 06/15/2007    Priority: High   Aortic atherosclerosis ([place]) 04/20/2021    Priority: Medium    Hyperglycemia 06/08/2015    Priority: Medium    CKD (chronic kidney disease), stage III ([place]) 10/24/2014    Priority: Medium    Sleep apnea 07/02/2008    Priority: Medium    Hyperlipidemia 06/14/2007    Priority: Medium    Essential hypertension 06/14/2007    Priority: Medium    History of colonic polyps 06/14/2007    Priority: Medium    Postherpetic neuralgia 03/19/2018    Priority: Low   Balanitis 03/06/2017    Priority: Low   OA (osteoarthritis) of knee 12/07/2015    Priority: Low   Former smoker 06/08/2015    Priority: Low   Edema 02/04/2015    Priority: Low   Allergic rhinitis 10/24/2014    Priority: Low   Obesity (BMI 30-39.9) 04/25/2014    Priority: Low   Right cervical radiculopathy 04/12/2013    Priority: Low   Low back pain 01/11/2008    Priority: Low   GERD 11/07/2007     Priority: Low   Trigger finger, acquired 10/23/2007    Priority: Low   Actinic keratosis 10/10/2018   Past Surgical History:  Procedure Laterality Date   ANGIOPLASTY  1993   CHOLECYSTECTOMY  2002   LUMBAR FUSION  1983   ROTATOR CUFF REPAIR  2009   right   TRIGGER FINGER RELEASE     right    Family History  Problem Relation Age of Onset   Stroke Mother    COPD Father    Heart disease Sister        valve replacement   CAD Brother    Heart attack Brother    Colon cancer Neg Hx    Esophageal cancer Neg Hx    Liver disease Neg Hx    Diabetes Neg Hx    Rectal cancer Neg Hx    Stomach cancer Neg Hx     Medications- reviewed and updated Current Outpatient Medications  Medication Sig Dispense Refill   atenolol (TENORMIN) 25 MG tablet TAKE (1/2) TABLET BY MOUTH ONCE DAILY. 45 tablet 3   atorvastatin (LIPITOR) 10 MG tablet TAKE 1 TABLET EVERY DAY 90 tablet 3   clopidogrel (PLAVIX) 75 MG tablet TAKE 1 TABLET EVERY DAY 90 tablet 3   fluticasone (CUTIVATE) 0.05 % cream Apply topically 2 (two) times daily as needed.  fluticasone (FLONASE) 50 MCG/ACT nasal spray INHALE 1 SPRAY INTO EACH NOSTRIL ONCE DAILY. 16 g 0   irbesartan-hydrochlorothiazide (AVALIDE) 150-12.5 MG tablet TAKE 2 TABLETS EVERY DAY 180 tablet 3   mercaptopurine (PURINETHOL) 50 MG tablet Take 1.5 tablets (75 mg total) by mouth daily. Give on an empty stomach 1 hour before or 2 hours after meals. 45 tablet 11   nitroGLYCERIN (NITROSTAT) 0.4 MG SL tablet PLACE 1 TAB UNDER TONGUE EVERY 5 MIN IF NEEDED FOR CHEST PAIN. MAY USE 3 TIMES.NO RELIEF CALL 911. 25 tablet 0   pantoprazole (PROTONIX) 40 MG tablet Take 1 tablet (40 mg total) by mouth daily. 30 tablet 11   potassium chloride SA (KLOR-CON) 20 MEQ tablet Take 1 tablet as needed daily as instructed by physician 30 tablet 3   tamsulosin (FLOMAX) 0.4 MG CAPS capsule Take 0.4 mg by mouth daily.      No current facility-administered medications for this visit.    Facility-Administered Medications Ordered in Other Visits  Medication Dose Route Frequency Provider Last Rate Last Admin   regadenoson (LEXISCAN) injection SOLN 0.4 mg  0.4 mg Intravenous Once Tobb, Kardie, DO        Allergies-reviewed and updated Allergies  Allergen Reactions   Penicillins Rash    REACTION: rash   Sulfasalazine Rash    Social History   Social History Narrative   Widower January 2019.. 3 children- son is a patient here. 4 grandkids.       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 Oropharynx normal Neck: no thyromegaly CV: RRR no murmurs rubs or gallops Lungs: CTAB no crackles, wheeze, rhonchi Abdomen: soft/nontender/nondistended/normal bowel sounds. No rebound or guarding.  Ext: no edema Skin: warm, dry Neuro: grossly normal, moves all extremities, PERRLA Msk: tight in right trapezius- no midline neck pain- some cracking in neck with motion   Assessment and Plan  76 y.o. male presenting for annual physical.  Health Maintenance counseling: 1. Anticipatory guidance: Patient counseled regarding regular dental exams -q6 months, eye exams -yearly-may have cataract surgery,  avoiding smoking and second hand smoke , limiting alcohol to 2 beverages per day - doesn't drink, no illicit drugs .   2. Risk factor reduction:  Advised patient of need for regular exercise and diet rich and fruits and vegetables to reduce risk of heart attack and stroke.  Exercise- not exercising - encouraged to gradually add in exercise even if starting with 5-10 minutes   recumbent bike at gym - since you have silver sneakers- take time for yourself TODAY and go sign up!  Diet/weight management-weight is stable from last year- discussed mild weight loss could be helpful along with dash eating plan.  Wt Readings from Last 3 Encounters:  11/04/22 209 lb 2 oz (94.9 kg)  08/15/22 205 lb (93 kg)  04/18/22 205 lb 9.6 oz (93.3 kg)   3. Immunizations/screenings/ancillary studies- recommend final shingrix at pharmacy and you are due for tetanus shot/Tdap  at pharmacy Immunization History  Administered Date(s) Administered   Fluad Quad(high Dose 65+) 08/31/2020, 09/13/2021, 08/31/2022   Influenza Split 08/19/2011, 08/17/2012   Influenza Whole 10/23/2007, 08/14/2009, 08/13/2010, 08/23/2019   Influenza, High Dose Seasonal PF 09/13/2016, 09/01/2017, 08/22/2018, 08/21/2019   Influenza,inj,Quad PF,6+ Mos 08/16/2013   Influenza-Unspecified 08/28/2014, 09/07/2015, 09/01/2017, 09/13/2018   Moderna Sars-Covid-2 Vaccination 12/27/2019, 01/24/2020, 10/04/2020, 06/25/2021   Pneumococcal Conjugate-13 06/08/2015   Pneumococcal Polysaccharide-23 04/25/2014   Td 11/14/2000, 09/21/2010   Zoster Recombinat (Shingrix) 09/24/2018  4. Prostate cancer screening- on flomax. Sees Dr. Gilford Rile still- they discussed one more psa which we did last year and was low risk trend- can hold off on repeat- has visti in a month or two with them Lab Results  Component Value Date   PSA 1.60 10/25/2021   PSA 1.58 04/16/2020   PSA 3.17 10/26/2019   5. Colon cancer screening - 04/28/21 with 3 year repeat colonoscopy 6. Skin cancer screening- DR. [place] Crane has visit in January. advised regular sunscreen use. Denies worrisome, changing, or new skin lesions- manages rosacea there 7. Smoking associated screening (lung cancer screening, AAA screen 65-75, UA)- former smoker- quit in 1972. AAA screen declines 8. STD screening - not dating still- not sexually active  Status of chronic or acute concerns   #CAD-  followed with Dr. Harrington Challenger #hyperlipidemia/ Aortic Athrosclerosis S: Medication: Atorvastatin 10 mg daily, Plavix 75 mg -no chest pain or shortness of breath  Lab Results  Component Value Date   CHOL 116 10/25/2021   HDL 31.40 (L) 10/25/2021   LDLCALC 63 10/25/2021   TRIG 107.0 10/25/2021   CHOLHDL 4 10/25/2021   A/P: For CAD-asymptomatic- continue current medications  For hyperlipidemia- hopefully stable- update lipid panel today. Continue current meds for now  For aortic atherosclerosis-ldl goal under 70 - continue risk factor modification   #hypertension S: medication: Atenolol 12.5 mg daily, Irbesartan-hctz 150-12.5 mg takes two tablets daily Home readings #s: usually 130s/70s BP Readings from Last 3 Encounters:  11/04/22 136/82  04/18/22 122/71  02/16/22 128/80  A/P: blood pressure is reasonably controlled but would prefer under 130- intsead of adding more medicine he is going to try to exercise more regularly and improve diet with DASH eating plan  #Chronic kidney disease stage III S: GFR is typically in the high 50s range- 54 last check -Patient knows to avoid NSAIDs -takes tylenol A/P: hopefully stable- update cmp today.   # Hyperglycemia/insulin resistance/prediabetes- peak a1c 6.14 April 2021 S:  Medication: None Exercise and diet- see above Lab Results  Component Value Date   HGBA1C 5.9 04/18/2022   HGBA1C 5.9 10/25/2021   HGBA1C 6.1 04/20/2021   A/P: hopefully stable or improved-  update a1c today. Discussed healthy eating and regular exercise   # ulcerative colitis- continued to follow up with Dr. Fuller Plan. 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 Lovena Neighbours.  Patient remained on tamsulosin-likely prostate related bleeding with last cystoscopy Mar 15, 2021- states has  follow up in 1-2 months  #PRIOR right anterior neck pain in June- we ordered ct neck with symptoms for 3 weeks but his symptoms resolved by the time several weeks later we were able to get improved by insurance. No carotid bruits.    #posterior left neck pain- new symptoms starting a month ago. Has tried ice,  heat, aspercreme without relief. Tylenol helps some. No radicular pain X-ray of cervical spine on 01/28/21 "1. Degenerative disc disease. Uncovertebral degenerative changes. Probable mild narrowing of neural foramina bilaterally. This could be better evaluated with cross-sectional imaging. 2. Probable carotid artery calcified atherosclerosis on the right. 3. No other abnormalities." -arthritis likely- will refer to physical therapy and can refer to sports medicine if needed in future if not improving  Recommended follow up: Return in about 6 months (around 05/06/2023) for followup or sooner if needed.Schedule b4 you leave. Future Appointments  Date Time Provider [place]  08/21/2023  2:00 PM [facility]   Lab/Order associations: fasting- [place] vs next week    ICD-10-CM   1. Preventative health care  Z00.00     2. Coronary artery disease involving native coronary artery of native heart without angina pectoris  I25.10 Comprehensive metabolic panel    CBC with Differential/Platelet    Lipid panel    3. Hyperglycemia  R73.9 Hemoglobin A1c    4. Cervical arthritis  M47.812 Ambulatory referral to Physical Therapy    5. Neck pain on left side  M54.2 Ambulatory referral to Physical Therapy      No orders of the defined types were placed in this encounter.   Return precautions advised.  Garret Reddish, MD

## 2022-11-04 NOTE — Patient Instructions (Addendum)
Health Maintenance Due  Topic Date Due   Zoster Vaccines- Shingrix (2 of 2) 11/19/2018   DTaP/Tdap/Td (3 - Tdap) 09/21/2020  recommend final shingrix at pharmacy and you are due for tetanus shot/Tdap at pharmacy   encouraged to gradually add in exercise even if starting with 5-10 minutes  recumbent bike at gym - since you have silver sneakers- take time for yourself TODAY and go sign up!   Schedule physical therapy visit before you leave today  Please go to [facility] (updated 01/09/2020) - located [address] across the street from [place] - in the basement - Hours: 7:30-5:30 PM M-F. You do NOT need an appointment.    Recommended follow up: Return in about 6 months (around 05/06/2023) for followup or sooner if needed.Schedule b4 you leave.

## 2022-11-09 NOTE — Therapy (Unsigned)
OUTPATIENT PHYSICAL THERAPY CERVICAL EVALUATION   Patient Name: [name] MRN: [id number] DOB:Nov 05, 1946, 76 y.o., male Today's Date: 11/10/2022  END OF SESSION:  PT End of Session - 11/10/22 1252     Visit Number 1    Number of Visits 16    Date for PT Re-Evaluation 01/05/23    Authorization Type [employer] Rossetti - auth requested    PT Start Time 1301    PT Stop Time 4098    PT Time Calculation (min) 41 min             Past Medical History:  Diagnosis Date   Adenomatous polyps 03/2004, 11/2012   Arthritis    Bradycardia 2018   Clotting disorder ([place])    on plavix   Coronary atherosclerosis of unspecified type of vessel, native or graft    Esophagitis 1993   GERD (gastroesophageal reflux disease)    Hyperlipidemia    Hypertension    Myocardial infarction ([place]) 1993   Shingles 2019   Sleep apnea    cpap   Stroke ([place]) 02/2014   Trigger finger (acquired)    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   UTI (urinary tract infection) 2019   Past Surgical History:  Procedure Laterality Date   [place]  2009   right   TRIGGER FINGER RELEASE     right   Patient Active Problem List   Diagnosis Date Noted   Aortic atherosclerosis ([place]) 04/20/2021   Actinic keratosis 10/10/2018   Postherpetic neuralgia 03/19/2018   Balanitis 03/06/2017   OA (osteoarthritis) of knee 12/07/2015   Former smoker 06/08/2015   Hyperglycemia 06/08/2015   Edema 02/04/2015   Allergic rhinitis 10/24/2014   CKD (chronic kidney disease), stage III ([place]) 10/24/2014   Obesity (BMI 30-39.9) 04/25/2014   Right cervical radiculopathy 04/12/2013   ULCERATIVE COLITIS-LEFT SIDE 07/03/2008   Sleep apnea 07/02/2008   Low back pain 01/11/2008   GERD 11/07/2007   Trigger finger, acquired 10/23/2007   CAD (coronary artery disease) 06/15/2007   Hyperlipidemia 06/14/2007   Essential hypertension 06/14/2007    History of colonic polyps 06/14/2007    PCP: Marin Olp, MD  REFERRING PROVIDER: Marin Olp, MD  REFERRING DIAG: [phone] (ICD-10-CM) - Cervical arthritis M54.2 (ICD-10-CM) - Neck pain on left side  THERAPY DIAG:  Cervicalgia - Plan: PT plan of care cert/re-cert  Muscle weakness (generalized) - Plan: PT plan of care cert/re-cert  Rationale for Evaluation and Treatment: Rehabilitation  ONSET DATE: a couple months ago  SUBJECTIVE:  SUBJECTIVE STATEMENT: States he sleeps on his left side. States the pain started a few months ago. Unsure of what caused it. States he has had some pain radiated down shoulder blade. Heat helped a little. Tylenol helps.   PERTINENT HISTORY:  MI, lumbar fusion 84, RCR L  PAIN:  Are you having pain? Yes: NPRS scale: 3/10 Pain location: left  UT Pain description: tight Aggravating factors: looking over shoulder, looking down Relieving factors: heat  PRECAUTIONS: None  WEIGHT BEARING RESTRICTIONS: No  FALLS:  Has patient fallen in last 6 months? No  LIVING ENVIRONMENT: Lives with: lives alone Lives in: House/apartment Stairs: Yes: External: 2 steps; on right going up Has following equipment at home: Single point cane and Walker - 2 wheeled  OCCUPATION: not working, Animator jobs  PLOF: Independent  PATIENT GOALS: to have less pain  NEXT MD VISIT:   OBJECTIVE:   DIAGNOSTIC FINDINGS:  No recent imaging  PATIENT SURVEYS:  FOTO 50% function  COGNITION: Overall cognitive status: Within functional limits for tasks assessed  SENSATION: Not tested  POSTURE: rounded shoulders, posterior pelvic tilt, and flexed trunk forward head  PALPATION:  Tenderness to palpation along left UT and B thoracic paraspinals and  rhomboids  CERVICAL ROM:   Active ROM A/PROM (deg) eval  Flexion 25*  Extension 18*  Right lateral flexion 20*  Left lateral flexion 2*  Right rotation 35*  Left rotation 30*   (Blank rows = not tested)    UE Measurements Upper Extremity Right 11/10/2022 Left 11/10/2022   A/PROM MMT A/PROM MMT  Shoulder Flexion 120 4 120 4  Shoulder Extension      Shoulder Abduction WFL (scaption) 3* WFL (scaption) 3*  Shoulder Adduction      Shoulder Internal Rotation  4  4  Shoulder External Rotation  4-  4-  Elbow Flexion      Elbow Extension      Wrist Flexion      Wrist Extension      Wrist Supination      Wrist Pronation      Wrist Ulnar Deviation      Wrist Radial Deviation      Grip Strength NA  NA     (Blank rows = not tested)   * pain   CERVICAL SPECIAL TESTS:  Neg spurlings Functional: difficult donning coat with reaching behind back with left arm.  TODAY'S TREATMENT:                                                                                                                              DATE:   11/10/2022  Therapeutic Exercise:  Aerobic: Supine: Prone:  Seated:  Standing: Neuromuscular Re-education: on posture, on use of towel roll in seated postures Manual Therapy: STM with percussion gun - tolerated well  Therapeutic Activity: Self Care: Trigger Point Dry Needling:  Modalities:   PATIENT EDUCATION:  Education details: on current presentation, on HEP, on clinical outcomes  score and POC, on anaotmy, on FOTO score Person educated: Patient Education method: Explanation, Demonstration, and Handouts Education comprehension: verbalized understanding   HOME EXERCISE PROGRAM: None at this time just use of towel roll in seated position  ASSESSMENT:  CLINICAL IMPRESSION: Patient complains of neck pain predominantly on the left side of his neck with no known MOI. Patient demonstrates poor slumped posture, weakness and ROM limitations in neck, thoracic  spine and shoulders. Focused on education and how PT could be beneficial for current condition. Patient would greatly benefit from skilled PT to improve overall function and QOL.  OBJECTIVE IMPAIRMENTS: decreased activity tolerance, decreased ROM, decreased strength, improper body mechanics, postural dysfunction, and pain.   ACTIVITY LIMITATIONS: lifting, sitting, and reach over head  PARTICIPATION LIMITATIONS: driving  PERSONAL FACTORS: Age, Fitness, and 1-2 comorbidities: cardiac history and lumbar fusion  are also affecting patient's functional outcome.   REHAB POTENTIAL: Good  CLINICAL DECISION MAKING: Stable/uncomplicated  EVALUATION COMPLEXITY: Low   GOALS: Goals reviewed with patient? yes  SHORT TERM GOALS: Target date: 12/08/2022  Patient will be independent in self management strategies to improve quality of life and functional outcomes. Baseline: New Program Goal status: INITIAL  2.  Patient will report at least 25% improvement in overall symptoms and/or function to demonstrate improved functional mobility Baseline: 0% better Goal status: INITIAL  3.  Patient will report use of positional support (towel roll) to improve seated postures Baseline: none currently Goal status: INITIAL    LONG TERM GOALS: Target date: 01/05/2023   Patient will report at least 50% improvement in overall symptoms and/or function to demonstrate improved functional mobility Baseline: 0% better Goal status: INITIAL  2.  Patient will demonstrate improved left shoulder IR to don coat Baseline: difficult/unable Goal status: INITIAL  3.  Patient will be able to demonstrate at least 45 degrees of cervical ROM to improve ability to check blind spots while driving Baseline: see above Goal status: INITIAL  4. Patient will improve  FOTO score to projected outcome to demonstrate overall improved function and QOL.  Goal Status: INITIAL   PLAN:  PT FREQUENCY: 2x/week  PT DURATION: 8  weeks  PLANNED INTERVENTIONS: Therapeutic exercises, Therapeutic activity, Neuromuscular re-education, Balance training, Gait training, Patient/Family education, Self Care, Joint mobilization, Joint manipulation, Vestibular training, Canalith repositioning, Prosthetic training, Aquatic Therapy, Dry Needling, Electrical stimulation, Spinal manipulation, Spinal mobilization, Cryotherapy, Moist heat, Traction, Ultrasound, Parrafin, Ionotophoresis 12m/ml Dexamethasone, Manual therapy, and Re-evaluation  PLAN FOR NEXT SESSION: neck ROM, STM, posture, shoulder ROM and reaching behind back   1:54 PM, 11/10/22 MJerene Pitch DPT Physical Therapy with [facility]

## 2022-11-10 ENCOUNTER — Ambulatory Visit: Payer: Medicare HMO | Admitting: Physical Therapy

## 2022-11-10 ENCOUNTER — Other Ambulatory Visit: Payer: Self-pay | Admitting: Family Medicine

## 2022-11-10 ENCOUNTER — Encounter: Payer: Self-pay | Admitting: Physical Therapy

## 2022-11-10 DIAGNOSIS — M542 Cervicalgia: Secondary | ICD-10-CM | POA: Diagnosis not present

## 2022-11-10 DIAGNOSIS — M6281 Muscle weakness (generalized): Secondary | ICD-10-CM | POA: Diagnosis not present

## 2022-11-15 ENCOUNTER — Encounter: Payer: Self-pay | Admitting: Physical Therapy

## 2022-11-15 ENCOUNTER — Ambulatory Visit: Payer: Medicare HMO | Admitting: Physical Therapy

## 2022-11-15 DIAGNOSIS — M542 Cervicalgia: Secondary | ICD-10-CM

## 2022-11-15 DIAGNOSIS — M6281 Muscle weakness (generalized): Secondary | ICD-10-CM | POA: Diagnosis not present

## 2022-11-15 NOTE — Therapy (Signed)
OUTPATIENT PHYSICAL THERAPY TREATMENT NOTE   Patient Name: [name] MRN: [id number] DOB:[birth year], 77 y.o., male Today's Date: 11/15/2022  PCP: Marin Olp, MD   REFERRING PROVIDER: Marin Olp, MD  END OF SESSION:   PT End of Session - 11/15/22 1509     Visit Number 2    Number of Visits 16    Date for PT Re-Evaluation 01/05/23    Authorization Type [employer] - auth requested    PT Start Time 4268    PT Stop Time 3419    PT Time Calculation (min) 38 min             Past Medical History:  Diagnosis Date   Adenomatous polyps 03/2004, 11/2012   Arthritis    Bradycardia 2018   Clotting disorder ([place])    on plavix   Coronary atherosclerosis of unspecified type of vessel, native or graft    Esophagitis 1993   GERD (gastroesophageal reflux disease)    Hyperlipidemia    Hypertension    Myocardial infarction ([place]) 1993   Shingles 2019   Sleep apnea    cpap   Stroke ([place]) 02/2014   Trigger finger (acquired)    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   UTI (urinary tract infection) 2019   Past Surgical History:  Procedure Laterality Date   [place]  2009   right   TRIGGER FINGER RELEASE     right   Patient Active Problem List   Diagnosis Date Noted   Aortic atherosclerosis ([place]) 04/20/2021   Actinic keratosis 10/10/2018   Postherpetic neuralgia 03/19/2018   Balanitis 03/06/2017   OA (osteoarthritis) of knee 12/07/2015   Former smoker 06/08/2015   Hyperglycemia 06/08/2015   Edema 02/04/2015   Allergic rhinitis 10/24/2014   CKD (chronic kidney disease), stage III ([place]) 10/24/2014   Obesity (BMI 30-39.9) 04/25/2014   Right cervical radiculopathy 04/12/2013   ULCERATIVE COLITIS-LEFT SIDE 07/03/2008   Sleep apnea 07/02/2008   Low back pain 01/11/2008   GERD 11/07/2007   Trigger finger, acquired 10/23/2007   CAD (coronary artery disease) 06/15/2007    Hyperlipidemia 06/14/2007   Essential hypertension 06/14/2007   History of colonic polyps 06/14/2007     THERAPY DIAG:  Cervicalgia  Muscle weakness (generalized)    REFERRING DIAG: M47.812 (ICD-10-CM) - Cervical arthritis M54.2 (ICD-10-CM) - Neck pain on left side    Rationale for Evaluation and Treatment: Rehabilitation   ONSET DATE: a couple months ago   SUBJECTIVE:  SUBJECTIVE STATEMENT: 11/15/2022 States his neck feels the same. States he tried the towel roll.   Eval: States he sleeps on his left side. States the pain started a few months ago. Unsure of what caused it. States he has had some pain radiated down shoulder blade. Heat helped a little. Tylenol helps.    PERTINENT HISTORY:  MI, lumbar fusion 84, RCR L   PAIN:  Are you having pain? Yes: NPRS scale: 3/10 Pain location: left  UT Pain description: tight Aggravating factors: looking over shoulder, looking down Relieving factors: heat   PRECAUTIONS: None   WEIGHT BEARING RESTRICTIONS: No   FALLS:  Has patient fallen in last 6 months? No   LIVING ENVIRONMENT: Lives with: lives alone Lives in: House/apartment Stairs: Yes: External: 2 steps; on right going up Has following equipment at home: Single point cane and Walker - 2 wheeled   OCCUPATION: not working, Animator jobs   PLOF: Independent   PATIENT GOALS: to have less pain   NEXT MD VISIT:    OBJECTIVE:    DIAGNOSTIC FINDINGS:  No recent imaging   PATIENT SURVEYS:  FOTO 50% function   COGNITION: Overall cognitive status: Within functional limits for tasks assessed   SENSATION: Not tested   POSTURE: rounded shoulders, posterior pelvic tilt, and flexed trunk forward head   PALPATION:            Tenderness to palpation along left UT and B  thoracic paraspinals and rhomboids   CERVICAL ROM:    Active ROM A/PROM (deg) eval  Flexion 25*  Extension 18*  Right lateral flexion 20*  Left lateral flexion 2*  Right rotation 35*  Left rotation 30*   (Blank rows = not tested)                UE Measurements       Upper Extremity Right 11/10/2022 Left 11/10/2022    A/PROM MMT A/PROM MMT  Shoulder Flexion 120 4 120 4  Shoulder Extension          Shoulder Abduction WFL (scaption) 3* WFL (scaption) 3*  Shoulder Adduction          Shoulder Internal Rotation   4   4  Shoulder External Rotation   4-   4-  Elbow Flexion          Elbow Extension          Wrist Flexion          Wrist Extension          Wrist Supination          Wrist Pronation          Wrist Ulnar Deviation          Wrist Radial Deviation          Grip Strength NA   NA                          (Blank rows = not tested)                       * pain     CERVICAL SPECIAL TESTS:  Neg spurlings Functional: difficult donning coat with reaching behind back with left arm.   TODAY'S TREATMENT:  DATE:   11/15/2022    Therapeutic Exercise:    Aerobic: Supine: cervical ROT and SB PROM//AAROM/AROM 10 minutes, bobble heads (chin tucks) PROM/AAROM/AROM - 5 minutes, scapular protraction 3x10 5" hold Prone:    Seated: posture with     Standing: shoulder ER at wall 3x10 5" holds Neuromuscular Re-education: on posture, on use of towel roll in seated postures-reviewed Manual Therapy: STM to suboccipitals and cervical paraspinals and SCM L>R  Therapeutic Activity: Self Care: Trigger Point Dry Needling:  Modalities:    PATIENT EDUCATION:  Education details: on HEP, on posture Person educated: Patient Education method: Explanation, Demonstration, and Handouts Education comprehension: verbalized understanding     HOME EXERCISE  PROGRAM: [id number]   ASSESSMENT:   CLINICAL IMPRESSION: 11/15/2022 Session focused on HEP advancement and manual which was tolerated well. Overall improved motion noted end of session. Added new exercises to HEP. Educated patient on rationale behind exercises and importance on mobility.  Eval: Patient complains of neck pain predominantly on the left side of his neck with no known MOI. Patient demonstrates poor slumped posture, weakness and ROM limitations in neck, thoracic spine and shoulders. Focused on education and how PT could be beneficial for current condition. Patient would greatly benefit from skilled PT to improve overall function and QOL.   OBJECTIVE IMPAIRMENTS: decreased activity tolerance, decreased ROM, decreased strength, improper body mechanics, postural dysfunction, and pain.    ACTIVITY LIMITATIONS: lifting, sitting, and reach over head   PARTICIPATION LIMITATIONS: driving   PERSONAL FACTORS: Age, Fitness, and 1-2 comorbidities: cardiac history and lumbar fusion  are also affecting patient's functional outcome.    REHAB POTENTIAL: Good   CLINICAL DECISION MAKING: Stable/uncomplicated   EVALUATION COMPLEXITY: Low     GOALS: Goals reviewed with patient? yes   SHORT TERM GOALS: Target date: 12/08/2022  Patient will be independent in self management strategies to improve quality of life and functional outcomes. Baseline: New Program Goal status: INITIAL   2.  Patient will report at least 25% improvement in overall symptoms and/or function to demonstrate improved functional mobility Baseline: 0% better Goal status: INITIAL   3.  Patient will report use of positional support (towel roll) to improve seated postures Baseline: none currently Goal status: INITIAL       LONG TERM GOALS: Target date: 01/05/2023    Patient will report at least 50% improvement in overall symptoms and/or function to demonstrate improved functional mobility Baseline: 0% better Goal  status: INITIAL   2.  Patient will demonstrate improved left shoulder IR to don coat Baseline: difficult/unable Goal status: INITIAL   3.  Patient will be able to demonstrate at least 45 degrees of cervical ROM to improve ability to check blind spots while driving Baseline: see above Goal status: INITIAL   4. Patient will improve  FOTO score to projected outcome to demonstrate overall improved function and QOL.           Goal Status: INITIAL     PLAN:   PT FREQUENCY: 2x/week   PT DURATION: 8 weeks   PLANNED INTERVENTIONS: Therapeutic exercises, Therapeutic activity, Neuromuscular re-education, Balance training, Gait training, Patient/Family education, Self Care, Joint mobilization, Joint manipulation, Vestibular training, Canalith repositioning, Prosthetic training, Aquatic Therapy, Dry Needling, Electrical stimulation, Spinal manipulation, Spinal mobilization, Cryotherapy, Moist heat, Traction, Ultrasound, Parrafin, Ionotophoresis '4mg'$ /ml Dexamethasone, Manual therapy, and Re-evaluation   PLAN FOR NEXT SESSION: neck ROM, STM, posture, shoulder ROM and reaching behind back   4:00 PM, 11/15/22 Jerene Pitch, DPT Physical Therapy with  [facility]

## 2022-11-17 ENCOUNTER — Encounter: Payer: Self-pay | Admitting: Physical Therapy

## 2022-11-17 ENCOUNTER — Ambulatory Visit: Payer: Medicare HMO | Admitting: Physical Therapy

## 2022-11-17 DIAGNOSIS — M6281 Muscle weakness (generalized): Secondary | ICD-10-CM | POA: Diagnosis not present

## 2022-11-17 DIAGNOSIS — M542 Cervicalgia: Secondary | ICD-10-CM | POA: Diagnosis not present

## 2022-11-17 NOTE — Therapy (Signed)
OUTPATIENT PHYSICAL THERAPY TREATMENT NOTE   Patient Name: [name] MRN: [id number] DOB:Jul 06, 1946, 77 y.o., male Today's Date: 11/17/2022  PCP: Marin Olp, MD   REFERRING PROVIDER: Marin Olp, MD  END OF SESSION:   PT End of Session - 11/17/22 1507     Visit Number 3    Number of Visits 16    Date for PT Re-Evaluation 01/05/23    Authorization Type [employer] - auth requested    PT Start Time 1508    PT Stop Time 1546    PT Time Calculation (min) 38 min             Past Medical History:  Diagnosis Date   Adenomatous polyps 03/2004, 11/2012   Arthritis    Bradycardia 2018   Clotting disorder ([place])    on plavix   Coronary atherosclerosis of unspecified type of vessel, native or graft    Esophagitis 1993   GERD (gastroesophageal reflux disease)    Hyperlipidemia    Hypertension    Myocardial infarction ([place]) 1993   Shingles 2019   Sleep apnea    cpap   Stroke ([place]) 02/2014   Trigger finger (acquired)    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   UTI (urinary tract infection) 2019   Past Surgical History:  Procedure Laterality Date   [place]  2009   right   TRIGGER FINGER RELEASE     right   Patient Active Problem List   Diagnosis Date Noted   Aortic atherosclerosis ([place]) 04/20/2021   Actinic keratosis 10/10/2018   Postherpetic neuralgia 03/19/2018   Balanitis 03/06/2017   OA (osteoarthritis) of knee 12/07/2015   Former smoker 06/08/2015   Hyperglycemia 06/08/2015   Edema 02/04/2015   Allergic rhinitis 10/24/2014   CKD (chronic kidney disease), stage III ([place]) 10/24/2014   Obesity (BMI 30-39.9) 04/25/2014   Right cervical radiculopathy 04/12/2013   ULCERATIVE COLITIS-LEFT SIDE 07/03/2008   Sleep apnea 07/02/2008   Low back pain 01/11/2008   GERD 11/07/2007   Trigger finger, acquired 10/23/2007   CAD (coronary artery disease) 06/15/2007    Hyperlipidemia 06/14/2007   Essential hypertension 06/14/2007   History of colonic polyps 06/14/2007     THERAPY DIAG:  Cervicalgia  Muscle weakness (generalized)    REFERRING DIAG: M47.812 (ICD-10-CM) - Cervical arthritis M54.2 (ICD-10-CM) - Neck pain on left side    Rationale for Evaluation and Treatment: Rehabilitation   ONSET DATE: a couple months ago   SUBJECTIVE:  SUBJECTIVE STATEMENT: 11/17/2022 States his left side of neck is real sore and tender and his neck is still cracking and popping with exercises.   Eval: States he sleeps on his left side. States the pain started a few months ago. Unsure of what caused it. States he has had some pain radiated down shoulder blade. Heat helped a little. Tylenol helps.    PERTINENT HISTORY:  MI, lumbar fusion 84, RCR L   PAIN:  Are you having pain? Yes: NPRS scale: 4/10 Pain location: left  UT Pain description: tight, soreness Aggravating factors: looking over shoulder, looking down Relieving factors: heat   PRECAUTIONS: None   WEIGHT BEARING RESTRICTIONS: No   FALLS:  Has patient fallen in last 6 months? No   LIVING ENVIRONMENT: Lives with: lives alone Lives in: House/apartment Stairs: Yes: External: 2 steps; on right going up Has following equipment at home: Single point cane and Walker - 2 wheeled   OCCUPATION: not working, Animator jobs   PLOF: Independent   PATIENT GOALS: to have less pain   NEXT MD VISIT:    OBJECTIVE:    DIAGNOSTIC FINDINGS:  No recent imaging   PATIENT SURVEYS:  FOTO 50% function   COGNITION: Overall cognitive status: Within functional limits for tasks assessed   SENSATION: Not tested   POSTURE: rounded shoulders, posterior pelvic tilt, and flexed trunk forward head    PALPATION:            Tenderness to palpation along left UT and B thoracic paraspinals and rhomboids   CERVICAL ROM:    Active ROM A/PROM (deg) eval  Flexion 25*  Extension 18*  Right lateral flexion 20*  Left lateral flexion 2*  Right rotation 35*  Left rotation 30*   (Blank rows = not tested)                UE Measurements       Upper Extremity Right 11/10/2022 Left 11/10/2022    A/PROM MMT A/PROM MMT  Shoulder Flexion 120 4 120 4  Shoulder Extension          Shoulder Abduction WFL (scaption) 3* WFL (scaption) 3*  Shoulder Adduction          Shoulder Internal Rotation   4   4  Shoulder External Rotation   4-   4-  Elbow Flexion          Elbow Extension          Wrist Flexion          Wrist Extension          Wrist Supination          Wrist Pronation          Wrist Ulnar Deviation          Wrist Radial Deviation          Grip Strength NA   NA                          (Blank rows = not tested)                       * pain     CERVICAL SPECIAL TESTS:  Neg spurlings Functional: difficult donning coat with reaching behind back with left arm.   TODAY'S TREATMENT:  DATE:   11/17/2022 Therapeutic Exercise:    Aerobic: Supine:   Prone:    Seated: posture with towel roll - 3 minutes, neck ROM in all directions, review of HEP. Shoulder rolls with PT assist  2 minutes both directions - B, shoulder shrugs and depression 3x10 5" holds, self mobilization with hand and with percussion gun (with towel) - 12 minutes total     Standing: shoulder ER at wall 3x10 5" holds Neuromuscular Re-education: on posture, on use of towel roll in seated postures-reviewed Manual Therapy: STM to left UT and cervical paraspinals - tolerated well  Therapeutic Activity: Self Care: Trigger Point Dry Needling:  Modalities: thermotherapy to left shoulder in supine during  posture and neck ROM exercises    PATIENT EDUCATION:  Education details: on HEP, on posture Person educated: Patient Education method: Explanation, Demonstration, and Handouts Education comprehension: verbalized understanding     HOME EXERCISE PROGRAM: [id number]   ASSESSMENT:   CLINICAL IMPRESSION: 11/17/2022 Patient with sightly off balance today compared to previous sessions. No reports of dizziness of patient feeling off. No other symptoms noted during session, will continue to monitor in future sessions. Trailed new exercises today but difficult for patient to perform correctly without PT assist for improved form. Reduced pain noted after percussion gun use. Will continue with current POC as tolerated.   Eval: Patient complains of neck pain predominantly on the left side of his neck with no known MOI. Patient demonstrates poor slumped posture, weakness and ROM limitations in neck, thoracic spine and shoulders. Focused on education and how PT could be beneficial for current condition. Patient would greatly benefit from skilled PT to improve overall function and QOL.   OBJECTIVE IMPAIRMENTS: decreased activity tolerance, decreased ROM, decreased strength, improper body mechanics, postural dysfunction, and pain.    ACTIVITY LIMITATIONS: lifting, sitting, and reach over head   PARTICIPATION LIMITATIONS: driving   PERSONAL FACTORS: Age, Fitness, and 1-2 comorbidities: cardiac history and lumbar fusion  are also affecting patient's functional outcome.    REHAB POTENTIAL: Good   CLINICAL DECISION MAKING: Stable/uncomplicated   EVALUATION COMPLEXITY: Low     GOALS: Goals reviewed with patient? yes   SHORT TERM GOALS: Target date: 12/08/2022  Patient will be independent in self management strategies to improve quality of life and functional outcomes. Baseline: New Program Goal status: INITIAL   2.  Patient will report at least 25% improvement in overall symptoms and/or function to  demonstrate improved functional mobility Baseline: 0% better Goal status: INITIAL   3.  Patient will report use of positional support (towel roll) to improve seated postures Baseline: none currently Goal status: INITIAL       LONG TERM GOALS: Target date: 01/05/2023    Patient will report at least 50% improvement in overall symptoms and/or function to demonstrate improved functional mobility Baseline: 0% better Goal status: INITIAL   2.  Patient will demonstrate improved left shoulder IR to don coat Baseline: difficult/unable Goal status: INITIAL   3.  Patient will be able to demonstrate at least 45 degrees of cervical ROM to improve ability to check blind spots while driving Baseline: see above Goal status: INITIAL   4. Patient will improve  FOTO score to projected outcome to demonstrate overall improved function and QOL.           Goal Status: INITIAL     PLAN:   PT FREQUENCY: 2x/week   PT DURATION: 8 weeks   PLANNED INTERVENTIONS: Therapeutic exercises, Therapeutic activity, Neuromuscular re-education,  Balance training, Gait training, Patient/Family education, Self Care, Joint mobilization, Joint manipulation, Vestibular training, Canalith repositioning, Prosthetic training, Aquatic Therapy, Dry Needling, Electrical stimulation, Spinal manipulation, Spinal mobilization, Cryotherapy, Moist heat, Traction, Ultrasound, Parrafin, Ionotophoresis '4mg'$ /ml Dexamethasone, Manual therapy, and Re-evaluation   PLAN FOR NEXT SESSION: neck ROM, STM, posture, shoulder ROM and reaching behind back, patient lives in [place]- may want to transition to [place] if a lot of sessions.  3:50 PM, 11/17/22 Jerene Pitch, DPT Physical Therapy with [facility]

## 2022-11-22 ENCOUNTER — Encounter: Payer: Self-pay | Admitting: Physical Therapy

## 2022-11-22 ENCOUNTER — Ambulatory Visit: Payer: Medicare HMO | Admitting: Physical Therapy

## 2022-11-22 DIAGNOSIS — M6281 Muscle weakness (generalized): Secondary | ICD-10-CM

## 2022-11-22 DIAGNOSIS — M542 Cervicalgia: Secondary | ICD-10-CM | POA: Diagnosis not present

## 2022-11-22 NOTE — Therapy (Signed)
OUTPATIENT PHYSICAL THERAPY TREATMENT NOTE   Patient Name: [name] MRN: [id number] DOB:August 10, 1946, 77 y.o., male Today's Date: 11/22/2022  PCP: Marin Olp, MD   REFERRING PROVIDER: Marin Olp, MD  END OF SESSION:   PT End of Session - 11/22/22 1339     Visit Number 4    Number of Visits 16    Date for PT Re-Evaluation 01/05/23    Authorization Type humana - 11/10/2022 - 01/14/2023 12 visits approved    Authorization - Visit Number 4    Authorization - Number of Visits 12    Progress Note Due on Visit 10    PT Start Time 9518    PT Stop Time 8416    PT Time Calculation (min) 40 min             Past Medical History:  Diagnosis Date   Adenomatous polyps 03/2004, 11/2012   Arthritis    Bradycardia 2018   Clotting disorder ([place])    on plavix   Coronary atherosclerosis of unspecified type of vessel, native or graft    Esophagitis 1993   GERD (gastroesophageal reflux disease)    Hyperlipidemia    Hypertension    Myocardial infarction ([place]) 1993   Shingles 2019   Sleep apnea    cpap   Stroke ([place]) 02/2014   Trigger finger (acquired)    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   UTI (urinary tract infection) 2019   Past Surgical History:  Procedure Laterality Date   [place]  2009   right   TRIGGER FINGER RELEASE     right   Patient Active Problem List   Diagnosis Date Noted   Aortic atherosclerosis ([place]) 04/20/2021   Actinic keratosis 10/10/2018   Postherpetic neuralgia 03/19/2018   Balanitis 03/06/2017   OA (osteoarthritis) of knee 12/07/2015   Former smoker 06/08/2015   Hyperglycemia 06/08/2015   Edema 02/04/2015   Allergic rhinitis 10/24/2014   CKD (chronic kidney disease), stage III ([place]) 10/24/2014   Obesity (BMI 30-39.9) 04/25/2014   Right cervical radiculopathy 04/12/2013   ULCERATIVE COLITIS-LEFT SIDE 07/03/2008   Sleep apnea  07/02/2008   Low back pain 01/11/2008   GERD 11/07/2007   Trigger finger, acquired 10/23/2007   CAD (coronary artery disease) 06/15/2007   Hyperlipidemia 06/14/2007   Essential hypertension 06/14/2007   History of colonic polyps 06/14/2007     THERAPY DIAG:  Cervicalgia  Muscle weakness (generalized)    REFERRING DIAG: M47.812 (ICD-10-CM) - Cervical arthritis M54.2 (ICD-10-CM) - Neck pain on left side    Rationale for Evaluation and Treatment: Rehabilitation   ONSET DATE: a couple months ago   SUBJECTIVE:  SUBJECTIVE STATEMENT: 11/22/2022 States he has been using the heat and massaging it and has been doing some of the exercises and it neck is sore. States he can tell his motion is better with turning his head  Eval: States he sleeps on his left side. States the pain started a few months ago. Unsure of what caused it. States he has had some pain radiated down shoulder blade. Heat helped a little. Tylenol helps.    PERTINENT HISTORY:  MI, lumbar fusion 84, RCR L   PAIN:  Are you having pain? Yes: NPRS scale: 2-3/10 Pain location: left  UT Pain description: tight, soreness Aggravating factors: looking over shoulder, looking down Relieving factors: heat   PRECAUTIONS: None   WEIGHT BEARING RESTRICTIONS: No   FALLS:  Has patient fallen in last 6 months? No   LIVING ENVIRONMENT: Lives with: lives alone Lives in: House/apartment Stairs: Yes: External: 2 steps; on right going up Has following equipment at home: Single point cane and Walker - 2 wheeled   OCCUPATION: not working, Animator jobs   PLOF: Independent   PATIENT GOALS: to have less pain   NEXT MD VISIT:    OBJECTIVE:    DIAGNOSTIC FINDINGS:  No recent imaging   PATIENT SURVEYS:  FOTO 50% function    COGNITION: Overall cognitive status: Within functional limits for tasks assessed   SENSATION: Not tested   POSTURE: rounded shoulders, posterior pelvic tilt, and flexed trunk forward head   PALPATION:            Tenderness to palpation along left UT and B thoracic paraspinals and rhomboids   CERVICAL ROM:    Active ROM A/PROM (deg) eval  Flexion 25*  Extension 18*  Right lateral flexion 20*  Left lateral flexion 2*  Right rotation 35*  Left rotation 30*   (Blank rows = not tested)                UE Measurements       Upper Extremity Right 11/10/2022 Left 11/10/2022    A/PROM MMT A/PROM MMT  Shoulder Flexion 120 4 120 4  Shoulder Extension          Shoulder Abduction WFL (scaption) 3* WFL (scaption) 3*  Shoulder Adduction          Shoulder Internal Rotation   4   4  Shoulder External Rotation   4-   4-  Elbow Flexion          Elbow Extension          Wrist Flexion          Wrist Extension          Wrist Supination          Wrist Pronation          Wrist Ulnar Deviation          Wrist Radial Deviation          Grip Strength NA   NA                          (Blank rows = not tested)                       * pain     CERVICAL SPECIAL TESTS:  Neg spurlings Functional: difficult donning coat with reaching behind back with left arm.   TODAY'S TREATMENT:  DATE:   11/22/2022 Therapeutic Exercise:    Aerobic: Supine:   Prone:    Seated:shoulder flexion with breathing in x15 B, neck rotation x20 5" holds B    Standing:  rows blue band 4x10 B, shoulder ext 4x10 BTB, UB 2 minutes fwd 2 minutes bkwd Neuromuscular Re-education:   Manual Therapy: STM to left UT and cervical paraspinals - tolerated well - with and without percussion gun Therapeutic Activity: Self Care: Trigger Point Dry Needling:  Modalities: thermotherapy to left shoulder in  seated during manual   PATIENT EDUCATION:  Education details: on HEP, on things to look for at gym, on safe use of exercise equipment Person educated: Patient Education method: Explanation, Demonstration, and Handouts Education comprehension: verbalized understanding     HOME EXERCISE PROGRAM: [id number]   ASSESSMENT:   CLINICAL IMPRESSION: 11/22/2022 *Patient interested in going to gym and inquired about equipment to use. Trailed different gym equipment and educated patient on safe use. Printed off pictures of nustep and UBE to ask about when going to the gym. No pain noted end of session just mild soreness. Will continue with current POC as tolerated. Provided patient with theraband for HEP adherence.  Eval: Patient complains of neck pain predominantly on the left side of his neck with no known MOI. Patient demonstrates poor slumped posture, weakness and ROM limitations in neck, thoracic spine and shoulders. Focused on education and how PT could be beneficial for current condition. Patient would greatly benefit from skilled PT to improve overall function and QOL.   OBJECTIVE IMPAIRMENTS: decreased activity tolerance, decreased ROM, decreased strength, improper body mechanics, postural dysfunction, and pain.    ACTIVITY LIMITATIONS: lifting, sitting, and reach over head   PARTICIPATION LIMITATIONS: driving   PERSONAL FACTORS: Age, Fitness, and 1-2 comorbidities: cardiac history and lumbar fusion  are also affecting patient's functional outcome.    REHAB POTENTIAL: Good   CLINICAL DECISION MAKING: Stable/uncomplicated   EVALUATION COMPLEXITY: Low     GOALS: Goals reviewed with patient? yes   SHORT TERM GOALS: Target date: 12/08/2022  Patient will be independent in self management strategies to improve quality of life and functional outcomes. Baseline: New Program Goal status: INITIAL   2.  Patient will report at least 25% improvement in overall symptoms and/or function to  demonstrate improved functional mobility Baseline: 0% better Goal status: INITIAL   3.  Patient will report use of positional support (towel roll) to improve seated postures Baseline: none currently Goal status: INITIAL       LONG TERM GOALS: Target date: 01/05/2023    Patient will report at least 50% improvement in overall symptoms and/or function to demonstrate improved functional mobility Baseline: 0% better Goal status: INITIAL   2.  Patient will demonstrate improved left shoulder IR to don coat Baseline: difficult/unable Goal status: INITIAL   3.  Patient will be able to demonstrate at least 45 degrees of cervical ROM to improve ability to check blind spots while driving Baseline: see above Goal status: INITIAL   4. Patient will improve  FOTO score to projected outcome to demonstrate overall improved function and QOL.           Goal Status: INITIAL     PLAN:   PT FREQUENCY: 2x/week   PT DURATION: 8 weeks   PLANNED INTERVENTIONS: Therapeutic exercises, Therapeutic activity, Neuromuscular re-education, Balance training, Gait training, Patient/Family education, Self Care, Joint mobilization, Joint manipulation, Vestibular training, Canalith repositioning, Prosthetic training, Aquatic Therapy, Dry Needling, Electrical stimulation, Spinal manipulation, Spinal  mobilization, Cryotherapy, Moist heat, Traction, Ultrasound, Parrafin, Ionotophoresis '4mg'$ /ml Dexamethasone, Manual therapy, and Re-evaluation   PLAN FOR NEXT SESSION: neck ROM, STM, posture, shoulder ROM and reaching behind back, patient lives in [place]- may want to transition to AP if a lot of sessions.  2:31 PM, 11/22/22 Jerene Pitch, DPT Physical Therapy with [facility]

## 2022-11-24 ENCOUNTER — Encounter: Payer: Self-pay | Admitting: Physical Therapy

## 2022-11-24 ENCOUNTER — Ambulatory Visit: Payer: Medicare HMO | Admitting: Physical Therapy

## 2022-11-24 DIAGNOSIS — M6281 Muscle weakness (generalized): Secondary | ICD-10-CM | POA: Diagnosis not present

## 2022-11-24 DIAGNOSIS — M542 Cervicalgia: Secondary | ICD-10-CM

## 2022-11-24 NOTE — Therapy (Signed)
OUTPATIENT PHYSICAL THERAPY TREATMENT NOTE PHYSICAL THERAPY DISCHARGE SUMMARY  Visits from Start of Care: 5  Current functional level related to goals / functional outcomes: All goals met   Remaining deficits: See below   Education / Equipment: See below   Patient agrees to discharge. Patient goals were met. Patient is being discharged due to meeting the stated rehab goals.   Patient Name: [name] MRN: [id number] DOB:[birth year], 77 y.o., male Today's Date: 11/24/2022  PCP: Marin Olp, MD   REFERRING PROVIDER: Marin Olp, MD  END OF SESSION:   PT End of Session - 11/24/22 1348     Visit Number 5    Number of Visits 16    Date for PT Re-Evaluation 01/05/23    Authorization Type humana - 11/10/2022 - 01/14/2023 12 visits approved    Authorization - Visit Number 5    Authorization - Number of Visits 12    Progress Note Due on Visit 10    PT Start Time 3149    PT Stop Time 1427    PT Time Calculation (min) 38 min             Past Medical History:  Diagnosis Date   Adenomatous polyps 03/2004, 11/2012   Arthritis    Bradycardia 2018   Clotting disorder ([place])    on plavix   Coronary atherosclerosis of unspecified type of vessel, native or graft    Esophagitis 1993   GERD (gastroesophageal reflux disease)    Hyperlipidemia    Hypertension    Myocardial infarction ([place]) 1993   Shingles 2019   Sleep apnea    cpap   Stroke ([place]) 02/2014   Trigger finger (acquired)    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   UTI (urinary tract infection) 2019   Past Surgical History:  Procedure Laterality Date   [place]  2009   right   TRIGGER FINGER RELEASE     right   Patient Active Problem List   Diagnosis Date Noted   Aortic atherosclerosis ([place]) 04/20/2021   Actinic keratosis 10/10/2018   Postherpetic neuralgia 03/19/2018   Balanitis 03/06/2017   OA  (osteoarthritis) of knee 12/07/2015   Former smoker 06/08/2015   Hyperglycemia 06/08/2015   Edema 02/04/2015   Allergic rhinitis 10/24/2014   CKD (chronic kidney disease), stage III ([place]) 10/24/2014   Obesity (BMI 30-39.9) 04/25/2014   Right cervical radiculopathy 04/12/2013   ULCERATIVE COLITIS-LEFT SIDE 07/03/2008   Sleep apnea 07/02/2008   Low back pain 01/11/2008   GERD 11/07/2007   Trigger finger, acquired 10/23/2007   CAD (coronary artery disease) 06/15/2007   Hyperlipidemia 06/14/2007   Essential hypertension 06/14/2007   History of colonic polyps 06/14/2007     THERAPY DIAG:  Cervicalgia  Muscle weakness (generalized)    REFERRING DIAG: M47.812 (ICD-10-CM) - Cervical arthritis M54.2 (ICD-10-CM) - Neck pain on left side    Rationale for Evaluation and Treatment: Rehabilitation   ONSET DATE: a couple months ago   SUBJECTIVE:  SUBJECTIVE STATEMENT: 11/24/2022 States that his exercises are going alright. States that he still has discomfort. States he feels 50% better since the start of PT.  Eval: States he sleeps on his left side. States the pain started a few months ago. Unsure of what caused it. States he has had some pain radiated down shoulder blade. Heat helped a little. Tylenol helps.    PERTINENT HISTORY:  MI, lumbar fusion 84, RCR L   PAIN:  Are you having pain? Yes: NPRS scale: 2-3/10 Pain location: left  UT Pain description: tight, soreness Aggravating factors: looking over shoulder, looking down Relieving factors: heat   PRECAUTIONS: None   WEIGHT BEARING RESTRICTIONS: No   FALLS:  Has patient fallen in last 6 months? No   LIVING ENVIRONMENT: Lives with: lives alone Lives in: House/apartment Stairs: Yes: External: 2 steps; on right going up Has  following equipment at home: Single point cane and Walker - 2 wheeled   OCCUPATION: not working, Animator jobs   PLOF: Independent   PATIENT GOALS: to have less pain      OBJECTIVE:     CERVICAL ROM:    Active ROM A/PROM (deg) 11/24/22  Flexion 30  Extension 25  Right lateral flexion 20  Left lateral flexion 20  Right rotation 60  Left rotation 45   (Blank rows = not tested)                UE Measurements       Upper Extremity Right 11/24/22 Left 11/24/22    A/PROM MMT A/PROM MMT  Shoulder Flexion 120 4 120 4  Shoulder Extension          Shoulder Abduction WFL (scaption) 4+ WFL (scaption) 4+  Shoulder Adduction          Shoulder Internal Rotation   4+   4+  Shoulder External Rotation   4+   4+  Elbow Flexion          Elbow Extension          Wrist Flexion          Wrist Extension          Wrist Supination          Wrist Pronation          Wrist Ulnar Deviation          Wrist Radial Deviation          Grip Strength NA   NA                          (Blank rows = not tested)                       * pain        TODAY'S TREATMENT:  DATE:   11/24/2022 Therapeutic Exercise:    Aerobic: Supine:   Prone:    Seated: neck rotation x20 5" holds B, pulleys 2 minutes into flexion, scaption 2 minutes    Standing:  rows blue band 4x10 B, shoulder ext 4x10 BTB, UBE 3 minutes fwd 3 minutes bkwd Neuromuscular Re-education:   Manual Therapy: Therapeutic Activity: Self Care: Trigger Point Dry Needling:  Modalities:    PATIENT EDUCATION:  Education details: on HEP, on importance of continued adherence to HEP  Person educated: Patient Education method: Explanation, Media planner, and Handouts Education comprehension: verbalized understanding     HOME EXERCISE PROGRAM: [id number]   ASSESSMENT:   CLINICAL IMPRESSION: 11/24/2022 All goals met at  this time. Reviewed HEP and answered all questions. Discussed importance of continued HEP for continued benefits/results. Discussed benefits of joining gym and what he can safely do there. Patient to discharge from PT to HEP at this time secondary to progress made and independence in HEP.   Eval: Patient complains of neck pain predominantly on the left side of his neck with no known MOI. Patient demonstrates poor slumped posture, weakness and ROM limitations in neck, thoracic spine and shoulders. Focused on education and how PT could be beneficial for current condition. Patient would greatly benefit from skilled PT to improve overall function and QOL.   OBJECTIVE IMPAIRMENTS: decreased activity tolerance, decreased ROM, decreased strength, improper body mechanics, postural dysfunction, and pain.    ACTIVITY LIMITATIONS: lifting, sitting, and reach over head   PARTICIPATION LIMITATIONS: driving   PERSONAL FACTORS: Age, Fitness, and 1-2 comorbidities: cardiac history and lumbar fusion  are also affecting patient's functional outcome.    REHAB POTENTIAL: Good   CLINICAL DECISION MAKING: Stable/uncomplicated   EVALUATION COMPLEXITY: Low     GOALS: Goals reviewed with patient? yes   SHORT TERM GOALS: Target date: 12/08/2022  Patient will be independent in self management strategies to improve quality of life and functional outcomes. Baseline: New Program Goal status: MET    2.  Patient will report at least 25% improvement in overall symptoms and/or function to demonstrate improved functional mobility Baseline: 0% better Goal status: MET   3.  Patient will report use of positional support (towel roll) to improve seated postures Baseline: none currently Goal status: MET        LONG TERM GOALS: Target date: 01/05/2023    Patient will report at least 50% improvement in overall symptoms and/or function to demonstrate improved functional mobility Baseline: 0% better Goal status: MET    2.  Patient will demonstrate improved left shoulder IR to don coat Baseline: difficult/unable Goal status: MET   3.  Patient will be able to demonstrate at least 45 degrees of cervical ROM to improve ability to check blind spots while driving Baseline: see above Goal status: MET    4. Patient will improve  FOTO score to projected outcome to demonstrate overall improved function and QOL.           Goal Status: MET     PLAN:   PT FREQUENCY: 2x/week   PT DURATION: 8 weeks   PLANNED INTERVENTIONS: Therapeutic exercises, Therapeutic activity, Neuromuscular re-education, Balance training, Gait training, Patient/Family education, Self Care, Joint mobilization, Joint manipulation, Vestibular training, Canalith repositioning, Prosthetic training, Aquatic Therapy, Dry Needling, Electrical stimulation, Spinal manipulation, Spinal mobilization, Cryotherapy, Moist heat, Traction, Ultrasound, Parrafin, Ionotophoresis '4mg'$ /ml Dexamethasone, Manual therapy, and Re-evaluation   PLAN FOR NEXT SESSION: neck ROM, STM, posture, shoulder ROM and reaching behind back, patient lives  in [place]- may want to transition to AP if a lot of sessions.  2:29 PM, 11/24/22 Jerene Pitch, DPT Physical Therapy with [facility]

## 2022-12-12 DIAGNOSIS — H2512 Age-related nuclear cataract, left eye: Secondary | ICD-10-CM | POA: Diagnosis not present

## 2022-12-12 DIAGNOSIS — H2513 Age-related nuclear cataract, bilateral: Secondary | ICD-10-CM | POA: Diagnosis not present

## 2022-12-22 ENCOUNTER — Telehealth: Payer: Self-pay | Admitting: Pharmacist

## 2022-12-22 NOTE — Progress Notes (Signed)
Care Management & Coordination Services Pharmacy Team  Reason for Encounter: Hypertension  Contacted patient to discuss hypertension disease state. Spoke with patient on 12/22/2022     Current antihypertensive regimen:  Atenolol 25 mg daily Irbesartan-HCTZ 150-12.5 mg two tablets once daily  Patient verbally confirms he is taking the above medications as directed. Yes  How often are you checking your Blood Pressure? 1-2x per week  he checks his blood pressure in the evening after taking his medication.  Current home BP readings: 120/71 pulse 65 (12/22/2022)  Wrist or arm cuff: arm Caffeine intake: one cup of coffee a day Salt intake: avoids salt  Any readings above 180/100? No If yes any symptoms of hypertensive emergency? patient denies any symptoms of high blood pressure  What recent interventions/DTPs have been made by any provider to improve Blood Pressure control since last CPP Visit: No interventions or DTPs.  Any recent hospitalizations or ED visits since last visit with CPP? No  What diet changes have been made to improve Blood Pressure Control?  Patient states his diet is not good. He reports eating biscuits and cereal for breakfast.  What exercise is being done to improve your Blood Pressure Control?  Yes walks some  Adherence Review: Is the patient currently on ACE/ARB medication? Yes Does the patient have >5 day gap between last estimated fill dates? No  Star Rating Drugs:  Atorvastatin 10 mg last filled 10/13/2022 90 DS Irbesartan 137m-Hctz 12.5 mg last filled 11/12/2022 90 DS   Chart Updates: Recent office visits:  11/04/2022 OV (PCP) HMarin Olp MD; no medication changes indicated.  Recent consult visits:  None  Hospital visits:  None in previous 6 months  Medications: Outpatient Encounter Medications as of 12/22/2022  Medication Sig   atenolol (TENORMIN) 25 MG tablet TAKE (1/2) TABLET BY MOUTH ONCE DAILY.   atorvastatin (LIPITOR) 10 MG  tablet TAKE 1 TABLET EVERY DAY   clopidogrel (PLAVIX) 75 MG tablet TAKE 1 TABLET EVERY DAY   fluticasone (CUTIVATE) 0.05 % cream Apply topically 2 (two) times daily as needed.   fluticasone (FLONASE) 50 MCG/ACT nasal spray INHALE 1 SPRAY INTO EACH NOSTRIL ONCE DAILY.   irbesartan-hydrochlorothiazide (AVALIDE) 150-12.5 MG tablet TAKE 2 TABLETS EVERY DAY   mercaptopurine (PURINETHOL) 50 MG tablet Take 1.5 tablets (75 mg total) by mouth daily. Give on an empty stomach 1 hour before or 2 hours after meals.   nitroGLYCERIN (NITROSTAT) 0.4 MG SL tablet PLACE 1 TAB UNDER TONGUE EVERY 5 MIN IF NEEDED FOR CHEST PAIN. MAY USE 3 TIMES.NO RELIEF CALL 911.   pantoprazole (PROTONIX) 40 MG tablet Take 1 tablet (40 mg total) by mouth daily.   potassium chloride SA (KLOR-CON) 20 MEQ tablet Take 1 tablet as needed daily as instructed by physician   tamsulosin (FLOMAX) 0.4 MG CAPS capsule Take 0.4 mg by mouth daily.    Facility-Administered Encounter Medications as of 12/22/2022  Medication   regadenoson (LEXISCAN) injection SOLN 0.4 mg    Recent Office Vitals: BP Readings from Last 3 Encounters:  11/04/22 136/82  04/18/22 122/71  02/16/22 128/80   Pulse Readings from Last 3 Encounters:  11/04/22 63  04/18/22 (!) 56  02/16/22 (!) 56    Wt Readings from Last 3 Encounters:  11/04/22 209 lb 2 oz (94.9 kg)  08/15/22 205 lb (93 kg)  04/18/22 205 lb 9.6 oz (93.3 kg)     Kidney Function Lab Results  Component Value Date/Time   CREATININE 1.18 11/04/2022 10:40 AM  CREATININE 1.33 04/18/2022 03:09 PM   CREATININE 1.22 (H) 10/19/2020 11:41 AM   GFR 60.37 11/04/2022 10:40 AM   GFRNONAA 58 (L) 10/19/2020 11:41 AM   GFRAA 68 10/19/2020 11:41 AM       Latest Ref Rng & Units 11/04/2022   10:40 AM 04/18/2022    3:09 PM 10/25/2021   11:30 AM  BMP  Glucose 70 - 99 mg/dL 95  81  87   BUN 6 - 23 mg/dL 15  13  16   $ Creatinine 0.40 - 1.50 mg/dL 1.18  1.33  1.30   Sodium 135 - 145 mEq/L 140  138  140    Potassium 3.5 - 5.1 mEq/L 3.5  3.4  4.7   Chloride 96 - 112 mEq/L 101  98  100   CO2 19 - 32 mEq/L 32  30  31   Calcium 8.4 - 10.5 mg/dL 9.5  9.4  10.2     Future Appointments  Date Time Provider [place]  03/07/2023  4:00 PM Edythe Clarity, [facility] None  05/05/2023  1:00 PM Marin Olp, MD [facility]  08/21/2023  2:00 PM [place]    April D Calhoun, [place] Pharmacist Assistant [phone]

## 2022-12-27 DIAGNOSIS — H2181 Floppy iris syndrome: Secondary | ICD-10-CM | POA: Diagnosis not present

## 2022-12-27 DIAGNOSIS — H2512 Age-related nuclear cataract, left eye: Secondary | ICD-10-CM | POA: Diagnosis not present

## 2022-12-27 DIAGNOSIS — H269 Unspecified cataract: Secondary | ICD-10-CM | POA: Diagnosis not present

## 2022-12-28 ENCOUNTER — Other Ambulatory Visit: Payer: Self-pay | Admitting: Family Medicine

## 2023-01-09 ENCOUNTER — Ambulatory Visit (INDEPENDENT_AMBULATORY_CARE_PROVIDER_SITE_OTHER): Payer: Medicare HMO | Admitting: Physician Assistant

## 2023-01-09 ENCOUNTER — Encounter: Payer: Self-pay | Admitting: Physician Assistant

## 2023-01-09 VITALS — BP 90/60 | HR 79 | Temp 97.8°F | Ht 68.0 in | Wt 204.0 lb

## 2023-01-09 DIAGNOSIS — R197 Diarrhea, unspecified: Secondary | ICD-10-CM | POA: Diagnosis not present

## 2023-01-09 LAB — CBC WITH DIFFERENTIAL/PLATELET
Basophils Absolute: 0 10*3/uL (ref 0.0–0.1)
Basophils Relative: 0.7 % (ref 0.0–3.0)
Eosinophils Absolute: 0.1 10*3/uL (ref 0.0–0.7)
Eosinophils Relative: 1.8 % (ref 0.0–5.0)
HCT: 46.8 % (ref 39.0–52.0)
Hemoglobin: 16.3 g/dL (ref 13.0–17.0)
Lymphocytes Relative: 27.6 % (ref 12.0–46.0)
Lymphs Abs: 1.8 10*3/uL (ref 0.7–4.0)
MCHC: 34.9 g/dL (ref 30.0–36.0)
MCV: 88.8 fl (ref 78.0–100.0)
Monocytes Absolute: 0.6 10*3/uL (ref 0.1–1.0)
Monocytes Relative: 9.2 % (ref 3.0–12.0)
Neutro Abs: 3.9 10*3/uL (ref 1.4–7.7)
Neutrophils Relative %: 60.7 % (ref 43.0–77.0)
Platelets: 224 10*3/uL (ref 150.0–400.0)
RBC: 5.26 Mil/uL (ref 4.22–5.81)
RDW: 16.2 % — ABNORMAL HIGH (ref 11.5–15.5)
WBC: 6.5 10*3/uL (ref 4.0–10.5)

## 2023-01-09 LAB — COMPREHENSIVE METABOLIC PANEL
ALT: 33 U/L (ref 0–53)
AST: 26 U/L (ref 0–37)
Albumin: 3.6 g/dL (ref 3.5–5.2)
Alkaline Phosphatase: 64 U/L (ref 39–117)
BUN: 15 mg/dL (ref 6–23)
CO2: 32 mEq/L (ref 19–32)
Calcium: 9 mg/dL (ref 8.4–10.5)
Chloride: 98 mEq/L (ref 96–112)
Creatinine, Ser: 1.37 mg/dL (ref 0.40–1.50)
GFR: 50.4 mL/min — ABNORMAL LOW (ref >60.00)
Glucose, Bld: 90 mg/dL (ref 70–99)
Potassium: 3 mEq/L — ABNORMAL LOW (ref 3.5–5.1)
Sodium: 138 mEq/L (ref 135–145)
Total Bilirubin: 0.9 mg/dL (ref 0.2–1.2)
Total Protein: 6.3 g/dL (ref 6.0–8.3)

## 2023-01-09 LAB — H. PYLORI ANTIBODY, IGG: H Pylori IgG: NEGATIVE

## 2023-01-09 LAB — LIPASE: Lipase: 21 U/L (ref 11.0–59.0)

## 2023-01-09 MED ORDER — ONDANSETRON HCL 4 MG PO TABS: 4.0000 mg | ORAL_TABLET | Freq: Three times a day (TID) | ORAL | 0 refills | Status: DC | PRN

## 2023-01-09 NOTE — Progress Notes (Signed)
[name] is a 77 y.o. male here for a follow up of a pre-existing problem.  History of Present Illness:   Chief Complaint  Patient presents with   Diarrhea    Pt c/o diarrhea since Thursday last week, going 2-3 times day. Took Imodium yesterday and it stopped now back again today. Also c/o nausea. No fever, some chills.    Diarrhea Diarrhea since last Thursday -- woke up with this No known contacts or suspicious food intake Wednesday night had hamburgers he prepared himself Having nausea since Saturday, has not had vomiting Taking Imodium and this helps but his symptoms quickly return No blood in stool, pain in stomach, fever, urinary pain, dizziness, lightheadedness, pre-syncope, falls, recent URI, recent travel, recent abx Hx of UC -- currently taking 75 mg mercaptopurine daily  Drinking 1-2 bottles of water daily, 20 oz diet pepsi, denies alcohol Appetite has been unaffected  - had soup yesterday  His dog passed away two days prior to onset of his symptoms -- had vomiting/diarrheal illness    Past Medical History:  Diagnosis Date   Adenomatous polyps 03/2004, 11/2012   Arthritis    Bradycardia 2018   Clotting disorder ([place])    on plavix   Coronary atherosclerosis of unspecified type of vessel, native or graft    Esophagitis 1993   GERD (gastroesophageal reflux disease)    Hyperlipidemia    Hypertension    Myocardial infarction ([place]) 1993   Shingles 2019   Sleep apnea    cpap   Stroke ([place]) 02/2014   Trigger finger (acquired)    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   UTI (urinary tract infection) 2019     Social History   Tobacco Use   Smoking status: Former    Packs/day: 1.00    Years: 3.00    Total pack years: 3.00    Types: Cigarettes    Quit date: 11/14/1966    Years since quitting: 56.1   Smokeless tobacco: Former    Types: Chew   Tobacco comments:    quit 50 years ago  Vaping Use   Vaping Use: Never used  Substance Use Topics    Alcohol use: No    Alcohol/week: 0.0 standard drinks of alcohol   Drug use: No    Past Surgical History:  Procedure Laterality Date   ANGIOPLASTY  1993   CHOLECYSTECTOMY  2002   LUMBAR FUSION  1983   ROTATOR CUFF REPAIR  2009   right   TRIGGER FINGER RELEASE     right    Family History  Problem Relation Age of Onset   Stroke Mother    COPD Father    Heart disease Sister        valve replacement   CAD Brother    Heart attack Brother    Colon cancer Neg Hx    Esophageal cancer Neg Hx    Liver disease Neg Hx    Diabetes Neg Hx    Rectal cancer Neg Hx    Stomach cancer Neg Hx     Allergies  Allergen Reactions   Penicillins Rash    REACTION: rash   Sulfasalazine Rash    Current Medications:   Current Outpatient Medications:    atenolol (TENORMIN) 25 MG tablet, TAKE (1/2) TABLET BY MOUTH ONCE DAILY., Disp: 45 tablet, Rfl: 3   atorvastatin (LIPITOR) 10 MG tablet, TAKE 1 TABLET EVERY DAY, Disp: 90 tablet, Rfl: 3   clopidogrel (PLAVIX) 75 MG tablet, TAKE 1 TABLET  EVERY DAY, Disp: 90 tablet, Rfl: 3   fluticasone (CUTIVATE) 0.05 % cream, Apply topically 2 (two) times daily as needed., Disp: , Rfl:    fluticasone (FLONASE) 50 MCG/ACT nasal spray, INHALE 1 SPRAY INTO EACH NOSTRIL ONCE DAILY., Disp: 16 g, Rfl: 0   irbesartan-hydrochlorothiazide (AVALIDE) 150-12.5 MG tablet, TAKE 2 TABLETS EVERY DAY, Disp: 180 tablet, Rfl: 3   mercaptopurine (PURINETHOL) 50 MG tablet, Take 1.5 tablets (75 mg total) by mouth daily. Give on an empty stomach 1 hour before or 2 hours after meals., Disp: 45 tablet, Rfl: 11   nitroGLYCERIN (NITROSTAT) 0.4 MG SL tablet, PLACE 1 TAB UNDER TONGUE EVERY 5 MIN IF NEEDED FOR CHEST PAIN. MAY USE 3 TIMES.NO RELIEF CALL 911., Disp: 25 tablet, Rfl: 0   pantoprazole (PROTONIX) 40 MG tablet, Take 1 tablet (40 mg total) by mouth daily., Disp: 30 tablet, Rfl: 11   tamsulosin (FLOMAX) 0.4 MG CAPS capsule, Take 0.4 mg by mouth daily. , Disp: , Rfl:    potassium  chloride SA (KLOR-CON) 20 MEQ tablet, Take 1 tablet as needed daily as instructed by physician (Patient not taking: Reported on 01/09/2023), Disp: 30 tablet, Rfl: 3 No current facility-administered medications for this visit.  Facility-Administered Medications Ordered in Other Visits:    regadenoson (LEXISCAN) injection SOLN 0.4 mg, 0.4 mg, Intravenous, Once, Tobb, Kardie, DO   Review of Systems:   Review of Systems  Gastrointestinal:  Positive for diarrhea.   Negative unless otherwise specified per HPI.  Vitals:   Vitals:   01/09/23 1123  BP: 90/60  Pulse: 79  Temp: 97.8 F (36.6 C)  TempSrc: Temporal  SpO2: 98%  Weight: 204 lb (92.5 kg)  Height: '5\' 8"'$  (1.727 m)     Body mass index is 31.02 kg/m.  Physical Exam:   Physical Exam Vitals and nursing note reviewed.  Constitutional:      General: He is not in acute distress.    Appearance: He is well-developed. He is not ill-appearing or toxic-appearing.  Cardiovascular:     Rate and Rhythm: Normal rate and regular rhythm.     Pulses: Normal pulses.     Heart sounds: Normal heart sounds, S1 normal and S2 normal.  Pulmonary:     Effort: Pulmonary effort is normal.     Breath sounds: Normal breath sounds.  Abdominal:     General: Abdomen is flat. Bowel sounds are normal.     Palpations: Abdomen is soft.     Tenderness: There is no abdominal tenderness. There is no guarding or rebound.  Skin:    General: Skin is warm and dry.  Neurological:     Mental Status: He is alert.     GCS: GCS eye subscore is 4. GCS verbal subscore is 5. GCS motor subscore is 6.  Psychiatric:        Speech: Speech normal.        Behavior: Behavior normal. Behavior is cooperative.     Assessment and Plan:   Diarrhea of presumed infectious origin New Blood pressure is on lower end of normal and is low for patient He is completely asymptomatic to his low blood pressure I have asked him to hold his irbesartan-hctz for a few days Check  blood pressure daily If BP goes >140/90, start BP medication back Drink 64 oz of water daily Update blood work today and stool studies -- provide recommendations accordingly  If new/worsening sx or concern for dehydration, he was instructed to go to the ER  Zofran prn for nausea provided  If persists, will need to have Dr Fuller Plan with GI weigh in  Inda Coke, PA-C

## 2023-01-09 NOTE — Patient Instructions (Signed)
It was great to see you!  Please hold your irbesartan-hydrochlorothiazide medication for 2-3 days OR until your diarrhea resolves. Check your blood pressure daily If blood pressure is above 140/90, restart your  irbesartan-hydrochlorothiazide  Goal is drink at least 64 oz of water daily  Bland diet  Zofran/Ondansetron has been sent for as needed use of nausea  If ANY new or worsening symptoms -- go to the ER  If dizziness or lightheadedness -- GO TO THE ER  If you feel like you are going to pass out -- GO TO THE ER   Take care,  Inda Coke PA-C

## 2023-01-10 ENCOUNTER — Other Ambulatory Visit: Payer: Medicare HMO

## 2023-01-10 ENCOUNTER — Other Ambulatory Visit: Payer: Self-pay

## 2023-01-10 DIAGNOSIS — R197 Diarrhea, unspecified: Secondary | ICD-10-CM

## 2023-01-15 LAB — STOOL CULTURE: E coli, Shiga toxin Assay: NEGATIVE

## 2023-01-15 LAB — SPECIMEN STATUS REPORT

## 2023-01-18 ENCOUNTER — Other Ambulatory Visit: Payer: Self-pay | Admitting: Family Medicine

## 2023-01-18 DIAGNOSIS — L718 Other rosacea: Secondary | ICD-10-CM | POA: Diagnosis not present

## 2023-01-23 ENCOUNTER — Telehealth: Payer: Self-pay | Admitting: Family Medicine

## 2023-01-23 NOTE — Telephone Encounter (Signed)
Sent pt vm that stool sample hasn't been reviewed, but once they are I will give cb

## 2023-01-23 NOTE — Telephone Encounter (Signed)
Called for update on stool sample results.

## 2023-01-25 ENCOUNTER — Ambulatory Visit (INDEPENDENT_AMBULATORY_CARE_PROVIDER_SITE_OTHER): Payer: Medicare HMO | Admitting: Family Medicine

## 2023-01-25 ENCOUNTER — Encounter: Payer: Self-pay | Admitting: Family Medicine

## 2023-01-25 VITALS — BP 134/78 | HR 66 | Temp 98.0°F | Ht 68.0 in | Wt 205.4 lb

## 2023-01-25 DIAGNOSIS — N183 Chronic kidney disease, stage 3 unspecified: Secondary | ICD-10-CM

## 2023-01-25 DIAGNOSIS — I700 Atherosclerosis of aorta: Secondary | ICD-10-CM

## 2023-01-25 DIAGNOSIS — K5150 Left sided colitis without complications: Secondary | ICD-10-CM | POA: Diagnosis not present

## 2023-01-25 DIAGNOSIS — I10 Essential (primary) hypertension: Secondary | ICD-10-CM | POA: Diagnosis not present

## 2023-01-25 LAB — COMPREHENSIVE METABOLIC PANEL
ALT: 28 U/L (ref 0–53)
AST: 20 U/L (ref 0–37)
Albumin: 3.7 g/dL (ref 3.5–5.2)
Alkaline Phosphatase: 64 U/L (ref 39–117)
BUN: 14 mg/dL (ref 6–23)
CO2: 31 mEq/L (ref 19–32)
Calcium: 9.5 mg/dL (ref 8.4–10.5)
Chloride: 100 mEq/L (ref 96–112)
Creatinine, Ser: 1.18 mg/dL (ref 0.40–1.50)
GFR: 60.27 mL/min (ref >60.00)
Glucose, Bld: 123 mg/dL — ABNORMAL HIGH (ref 70–99)
Potassium: 3.5 mEq/L (ref 3.5–5.1)
Sodium: 139 mEq/L (ref 135–145)
Total Bilirubin: 0.9 mg/dL (ref 0.2–1.2)
Total Protein: 6.3 g/dL (ref 6.0–8.3)

## 2023-01-25 LAB — CBC WITH DIFFERENTIAL/PLATELET
Basophils Absolute: 0.1 10*3/uL (ref 0.0–0.1)
Basophils Relative: 1.3 % (ref 0.0–3.0)
Eosinophils Absolute: 0.2 10*3/uL (ref 0.0–0.7)
Eosinophils Relative: 3.8 % (ref 0.0–5.0)
HCT: 46.1 % (ref 39.0–52.0)
Hemoglobin: 15.6 g/dL (ref 13.0–17.0)
Lymphocytes Relative: 27.3 % (ref 12.0–46.0)
Lymphs Abs: 1.6 10*3/uL (ref 0.7–4.0)
MCHC: 33.8 g/dL (ref 30.0–36.0)
MCV: 90.5 fl (ref 78.0–100.0)
Monocytes Absolute: 0.5 10*3/uL (ref 0.1–1.0)
Monocytes Relative: 8.7 % (ref 3.0–12.0)
Neutro Abs: 3.4 10*3/uL (ref 1.4–7.7)
Neutrophils Relative %: 58.9 % (ref 43.0–77.0)
Platelets: 210 10*3/uL (ref 150.0–400.0)
RBC: 5.09 Mil/uL (ref 4.22–5.81)
RDW: 16.2 % — ABNORMAL HIGH (ref 11.5–15.5)
WBC: 5.9 10*3/uL (ref 4.0–10.5)

## 2023-01-25 LAB — TSH: TSH: 1.11 u[IU]/mL (ref 0.35–5.50)

## 2023-01-25 NOTE — Patient Instructions (Addendum)
Team please input his shingrix, tetanus shot- listed under meds 11/11/22- then delete these after under immunizations   blood pressure has been elevated in evenings at home and in office we prefer <130/80 as well and slightly above this- we discussed trying irbesartan hydrochlorothiazide 150-12.5 mg 1 tablet with breakfast and then again one tablet with dinner and continue atenolol half tablet in the morning -also increase walking at least 10-15 mins a day or other preferred exercise -also try to watch salt content -update me in 10 days- if above goal may add amlodipine 2.5 mg  Please stop by lab before you go If you have mychart- we will send your results within 3 business days of [place] receiving them.  If you do not have mychart- we will call you about results within 5 business days of [place] receiving them.  *please also note that you will see labs on mychart as soon as they post. I will later go in and write notes on them- will say "notes from Dr. Yong Channel"   Recommended follow up: Return for next already scheduled visit or sooner if needed. But I need to know in 10 days how pressures are doing so I can make a decision

## 2023-01-25 NOTE — Progress Notes (Signed)
Phone [phone] In person visit   Subjective:   [name] is a 77 y.o. year old very pleasant male patient who presents for/with See problem oriented charting Chief Complaint  Patient presents with   Follow-up    (No mask)   Hypertension    Pt c/o elevated bp readings with the highest being 156/85 and c/o fatigue/low energy.   Past Medical History-  Patient Active Problem List   Diagnosis Date Noted   ULCERATIVE COLITIS-LEFT SIDE 07/03/2008    Priority: High   CAD (coronary artery disease) 06/15/2007    Priority: High   Aortic atherosclerosis ([place]) 04/20/2021    Priority: Medium    Hyperglycemia 06/08/2015    Priority: Medium    CKD (chronic kidney disease), stage III ([place]) 10/24/2014    Priority: Medium    Sleep apnea 07/02/2008    Priority: Medium    Hyperlipidemia 06/14/2007    Priority: Medium    Essential hypertension 06/14/2007    Priority: Medium    History of colonic polyps 06/14/2007    Priority: Medium    Postherpetic neuralgia 03/19/2018    Priority: Low   Balanitis 03/06/2017    Priority: Low   OA (osteoarthritis) of knee 12/07/2015    Priority: Low   Former smoker 06/08/2015    Priority: Low   Edema 02/04/2015    Priority: Low   Allergic rhinitis 10/24/2014    Priority: Low   Obesity (BMI 30-39.9) 04/25/2014    Priority: Low   Right cervical radiculopathy 04/12/2013    Priority: Low   Low back pain 01/11/2008    Priority: Low   GERD 11/07/2007    Priority: Low   Trigger finger, acquired 10/23/2007    Priority: Low   Actinic keratosis 10/10/2018    Medications- reviewed and updated Current Outpatient Medications  Medication Sig Dispense Refill   atenolol (TENORMIN) 25 MG tablet TAKE (1/2) TABLET BY MOUTH ONCE DAILY. 45 tablet 0   atorvastatin (LIPITOR) 10 MG tablet TAKE 1 TABLET EVERY DAY 90 tablet 3   clopidogrel (PLAVIX) 75 MG tablet TAKE 1 TABLET EVERY DAY 90 tablet 3   doxycycline (VIBRAMYCIN) 50 MG capsule Take 50 mg by  mouth daily. For rosacea from dermatology     fluticasone (CUTIVATE) 0.05 % cream Apply topically 2 (two) times daily as needed.     fluticasone (FLONASE) 50 MCG/ACT nasal spray INHALE 1 SPRAY INTO EACH NOSTRIL ONCE DAILY. 16 g 0   irbesartan-hydrochlorothiazide (AVALIDE) 150-12.5 MG tablet TAKE 2 TABLETS EVERY DAY 180 tablet 3   mercaptopurine (PURINETHOL) 50 MG tablet Take 1.5 tablets (75 mg total) by mouth daily. Give on an empty stomach 1 hour before or 2 hours after meals. 45 tablet 11   metroNIDAZOLE (METROGEL) 0.75 % gel Apply 1 Application topically 2 (two) times daily. From dermatology for rosacea     nitroGLYCERIN (NITROSTAT) 0.4 MG SL tablet PLACE 1 TAB UNDER TONGUE EVERY 5 MIN IF NEEDED FOR CHEST PAIN. MAY USE 3 TIMES.NO RELIEF CALL 911. 25 tablet 0   ondansetron (ZOFRAN) 4 MG tablet Take 1 tablet (4 mg total) by mouth every 8 (eight) hours as needed for nausea or vomiting. 20 tablet 0   pantoprazole (PROTONIX) 40 MG tablet Take 1 tablet (40 mg total) by mouth daily. 30 tablet 11   potassium chloride SA (KLOR-CON) 20 MEQ tablet Take 1 tablet as needed daily as instructed by physician 30 tablet 3   tamsulosin (FLOMAX) 0.4 MG CAPS capsule Take 0.4 mg  by mouth daily.      No current facility-administered medications for this visit.     Objective:  BP 134/78   Pulse 66   Temp 98 F (36.7 C)   Ht '5\' 8"'$  (1.727 m)   Wt 205 lb 6.4 oz (93.2 kg)   SpO2 96%   BMI 31.23 kg/m  Gen: NAD, resting comfortably CV: RRR no murmurs rubs or gallops Lungs: CTAB no crackles, wheeze, rhonchi Ext: minimal edema Skin: warm, dry     Assessment and Plan   #CAD- followed with Dr. Harrington Challenger #hyperlipidemia/ Aortic Athrosclerosis S: Medication: Atorvastatin 10 mg daily, Plavix 75 mg Lab Results  Component Value Date   CHOL 110 11/04/2022   HDL 31.80 (L) 11/04/2022   LDLCALC 57 11/04/2022   TRIG 108.0 11/04/2022   CHOLHDL 3 11/04/2022     A/P: For CAD-asymptomatic-continue current  medication For hyperlipidemia-goal with LDL under 70-continue current medication For aortic atherosclerosis-at goal with LDL under 70-continue current medication  #hypertension-Home cuff appeared reasonably accurate today S: medication: Atenolol 12.5 mg daily, Irbesartan-hctz 150-12.5 mg takes two tablets daily Home readings #s: Home readings have been more elevated lately with highest at 156/85 typically in the evening but as low as 114. Has tried to watch sodium intake but may hve looseened recently -Back in 2013 had also been on a combination pill with amlodipine  -No chest pain or shortness of breath or blurry vision reported.  He does feel more fatigued recently overall - but did have recent GI illness in late February that lasted for 2 days and got more dehydrated- they did have to hold blood pressure short term but has started back BP Readings from Last 3 Encounters:  01/25/23 134/78  01/09/23 90/60  11/04/22 136/82   A/P: blood pressure has been elevated in evenings at home and in office we prefer <130/80 with CAD shitory as well and slightly above this- we discussed trying irbesartan hydrochlorothiazide 150-12.5 mg 1 tablet with breakfast and then again one tablet with dinner and continue atenolol half tablet in the morning.  Slightly hesitant to immediately change as he has had 1 reading as low as 114 at home recently outside of GI illness -also increase walking at least 10-15 mins a day or other preferred exercise -also try to watch salt content -update me in 10 days- if above goal may add amlodipine 2.5 mg -Update labs in regards to fatigue-CBC, CMP, TSH.  Had A1c in December and only in prediabetes range-doubt he has developed diabetes  #Chronic kidney disease stage III S: GFR is typically in the high 50s range -Patient knows to avoid NSAIDs  A/P: GFR actually increased up to 60 but 2 months ago with mild worsening down to 50 with recent GI illness-update with labs today especially  with fatigue and recheck potassium levels-if potassium still low we will plan on replating   # ulcerative colitis-appears stable.  Continued to follow up with Dr. Fuller Plan. Remained on mercaptopurine-continue current medication - last colonoscopy 04/28/21-no reported blood in the stool or abdominal cramping-check CBC though to make sure no anemia   Recommended follow up: Return for next already scheduled visit or sooner if needed. Future Appointments  Date Time Provider [place]  02/14/2023 10:00 AM Fay Records, MD [facility]  03/07/2023  4:00 PM Edythe Clarity, [facility] None  05/05/2023  1:00 PM Marin Olp, MD [facility]  08/21/2023  2:00 PM [facility] CO[facility]    Lab/Order  associations:   ICD-10-CM   1. Essential hypertension  I10 CBC with Differential/Platelet    Comprehensive metabolic panel    TSH    2. Left sided ulcerative colitis without complication (HCC) Chronic K51.50     3. Aortic atherosclerosis (HCC) Chronic I70.0     4. Stage 3 chronic kidney disease, unspecified whether stage 3a or 3b CKD ([place]) Chronic N18.30      Return precautions advised.  Garret Reddish, MD

## 2023-02-07 ENCOUNTER — Telehealth: Payer: Self-pay | Admitting: Family Medicine

## 2023-02-07 NOTE — Telephone Encounter (Signed)
Patient states his blood pressure has been running high - this morning BP 142/85 (02/07/23). BP has been running between 129/72 and 154/88 .States BP usually higher in the evenings.  Patient requests to be called - states Dr. Yong Channel spoke with him about a different medication at last office visit.  States he has been walking some, watching sodium intake, taking medication as prescribed.

## 2023-02-08 ENCOUNTER — Telehealth: Payer: Self-pay | Admitting: Family Medicine

## 2023-02-08 MED ORDER — AMLODIPINE BESYLATE 2.5 MG PO TABS: 2.5000 mg | ORAL_TABLET | Freq: Every day | ORAL | 1 refills | Status: DC

## 2023-02-08 NOTE — Telephone Encounter (Signed)
See prior phone note. 

## 2023-02-08 NOTE — Telephone Encounter (Signed)
Called and lm on pt vm tcb. 

## 2023-02-08 NOTE — Telephone Encounter (Signed)
Patient returned call

## 2023-02-08 NOTE — Telephone Encounter (Signed)
I verified in Dr. Ronney Lion last ov note and he stated "may add Amlodipine 2.5mg ". With Dr. Yong Channel not in office I verified with Dr. Cherlynn Kaiser to ensure that it was ok for me to send in script for Amlodipine. Script has been sent to pharmacy awaiting pt to call back to inform.

## 2023-02-12 NOTE — Progress Notes (Unsigned)
Cardiology Office Note   Date:  02/14/2023   ID:  [name], Lowery December 19, 1945, MRN [id number]  PCP:  Marin Olp, MD  Cardiologist:   Dorris Carnes, MD    F/U of CAD     History of Present Illness: [name] is a 77 y.o. male with a history of CAD (MI in 1993)  Underwent PTCA to LAD  Nuclear scan in 2011 normal  Echo 2016 LVEF normal   I saw him in 2021 \ Breakfast :  Cereal or waffle or [employer] sausage biscuit Lunch  Sandwich or fast food  Dinner  Meat/ veggies  I saw him in early 2022 Since then he has done [place] in Dec 2022    Took a couple weeks to get better    Breathing is OK    No CP    No dizzeinss   Notes occasional indigestion   Got tightness and discomfort in chest  Burps and it goes awsy   I saw the pt in clinic in March 2023  Patient says BP at night is higher  140s, 150s  Last week he saw  Dr Yong Channel Rx amlodipine 2.5 mg  Taking at dinner   He denies CP  Breathing is good  Walks 3x per wk Had diarrhea a few weeks ago   Got dehydrated   Gone now        Current Meds  Medication Sig   amLODipine (NORVASC) 2.5 MG tablet Take 1 tablet (2.5 mg total) by mouth daily.   atenolol (TENORMIN) 25 MG tablet TAKE (1/2) TABLET BY MOUTH ONCE DAILY.   atorvastatin (LIPITOR) 10 MG tablet TAKE 1 TABLET EVERY DAY   clopidogrel (PLAVIX) 75 MG tablet TAKE 1 TABLET EVERY DAY   doxycycline (VIBRAMYCIN) 50 MG capsule Take 50 mg by mouth daily. For rosacea from dermatology   fluticasone (CUTIVATE) 0.05 % cream Apply topically 2 (two) times daily as needed.   fluticasone (FLONASE) 50 MCG/ACT nasal spray INHALE 1 SPRAY INTO EACH NOSTRIL ONCE DAILY.   irbesartan-hydrochlorothiazide (AVALIDE) 150-12.5 MG tablet TAKE 2 TABLETS EVERY DAY   mercaptopurine (PURINETHOL) 50 MG tablet Take 1.5 tablets (75 mg total) by mouth daily. Give on an empty stomach 1 hour before or 2 hours after meals.   metroNIDAZOLE (METROGEL) 0.75 % gel Apply 1 Application topically 2  (two) times daily. From dermatology for rosacea   nitroGLYCERIN (NITROSTAT) 0.4 MG SL tablet PLACE 1 TAB UNDER TONGUE EVERY 5 MIN IF NEEDED FOR CHEST PAIN. MAY USE 3 TIMES.NO RELIEF CALL 911.   ondansetron (ZOFRAN) 4 MG tablet Take 1 tablet (4 mg total) by mouth every 8 (eight) hours as needed for nausea or vomiting.   potassium chloride SA (KLOR-CON) 20 MEQ tablet Take 1 tablet as needed daily as instructed by physician   tamsulosin (FLOMAX) 0.4 MG CAPS capsule Take 0.4 mg by mouth daily.      Allergies:   Penicillins and Sulfasalazine   Past Medical History:  Diagnosis Date   Adenomatous polyps 03/2004, 11/2012   Arthritis    Bradycardia 2018   Clotting disorder    on plavix   Coronary atherosclerosis of unspecified type of vessel, native or graft    Esophagitis 1993   GERD (gastroesophageal reflux disease)    Hyperlipidemia    Hypertension    Myocardial infarction 1993   Shingles 2019   Sleep apnea    cpap   Stroke 02/2014   Trigger  finger (acquired)    Ulcerative (chronic) proctosigmoiditis 11/2006   UTI (urinary tract infection) 2019    Past Surgical History:  Procedure Laterality Date   [place]  2009   right   TRIGGER FINGER RELEASE     right     Social History:  The patient  reports that he quit smoking about 56 years ago. His smoking use included cigarettes. He has a 3.00 pack-year smoking history. He has quit using smokeless tobacco.  His smokeless tobacco use included chew. He reports that he does not drink alcohol and does not use drugs.   Family History:  The patient's family history includes CAD in his brother; COPD in his father; Heart attack in his brother; Heart disease in his sister; Stroke in his mother.    ROS:  Please see the history of present illness. All other systems are reviewed and  Negative to the above problem except as noted.    PHYSICAL EXAM: VS:  BP 110/64    Pulse 62   Ht 5\' 8"  (1.727 m)   Wt 206 lb (93.4 kg)   SpO2 95%   BMI 31.32 kg/m   [id number] 77 yo , in no acute distress  HEENT: normal  Neck: no JVD orcarotid bruits,  Cardiac: RRR; no murmurs, rubs, or gallops,no signif edema  Respiratory:  clear to auscultation bilaterally,  GI: soft, nontender, nondistended, + BS  No hepatomegaly  MS: no deformity Moving all extremities   Skin: warm and dry, Rash on face  Neuro:  Strength and sensation are intact Psych: euthymic mood, full affect   EKG:  EKG is ordered today. SB   54 bpm     Lipid Panel    Component Value Date/Time   CHOL 110 11/04/2022 1040   TRIG 108.0 11/04/2022 1040   TRIG 88 10/20/2006 0927   HDL 31.80 (L) 11/04/2022 1040   CHOLHDL 3 11/04/2022 1040   VLDL 21.6 11/04/2022 1040   LDLCALC 57 11/04/2022 1040   LDLCALC 66 10/19/2020 1141      Wt Readings from Last 3 Encounters:  02/14/23 206 lb (93.4 kg)  01/25/23 205 lb 6.4 oz (93.2 kg)  01/09/23 204 lb (92.5 kg)      ASSESSMENT AND PLAN:  1  CAD  Pt has occasional chest discomfort with yard work   Doesn't push himself    REcomm Stress myoview     2  HL  LDL is very good at 63  HDL 31     3  HTN  BP is good  COntinue meds   4  Morbid obesity Eats a lot of bread  Discussed   F/U in 1 year    Current medicines are reviewed at length with the patient today.  The patient does not have concerns regarding medicines.  Signed, Dorris Carnes, MD  02/14/2023 10:37 AM    [address], [place], [place]  [address] Phone: [phone]; Fax: [phone]

## 2023-02-14 ENCOUNTER — Encounter: Payer: Self-pay | Admitting: Internal Medicine

## 2023-02-14 ENCOUNTER — Ambulatory Visit: Payer: Medicare HMO | Attending: Internal Medicine | Admitting: Internal Medicine

## 2023-02-14 VITALS — BP 110/64 | HR 62 | Ht 68.0 in | Wt 206.0 lb

## 2023-02-14 DIAGNOSIS — I2510 Atherosclerotic heart disease of native coronary artery without angina pectoris: Secondary | ICD-10-CM | POA: Diagnosis not present

## 2023-02-14 NOTE — Patient Instructions (Signed)
Medication Instructions:   *If you need a refill on your cardiac medications before your next appointment, please call your pharmacy*   Lab Work:  If you have labs (blood work) drawn today and your tests are completely normal, you will receive your results only by: MyChart Message (if you have MyChart) OR A paper copy in the mail If you have any lab test that is abnormal or we need to change your treatment, we will call you to review the results.   Testing/Procedures:    Follow-Up: At [facility], you and your health needs are our priority.  As part of our continuing mission to provide you with exceptional heart care, we have created designated Provider Care Teams.  These Care Teams include your primary Cardiologist (physician) and Advanced Practice Providers (APPs -  Physician Assistants and Nurse Practitioners) who all work together to provide you with the care you need, when you need it.  We recommend signing up for the patient portal called "MyChart".  Sign up information is provided on this After Visit Summary.  MyChart is used to connect with patients for Virtual Visits (Telemedicine).  Patients are able to view lab/test results, encounter notes, upcoming appointments, etc.  Non-urgent messages can be sent to your provider as well.   To learn more about what you can do with MyChart, go to [email or web address].    Your next appointment:   8 month(s)  Provider:   Paula Ross, MD     Other Instructions   

## 2023-02-21 DIAGNOSIS — X32XXXD Exposure to sunlight, subsequent encounter: Secondary | ICD-10-CM | POA: Diagnosis not present

## 2023-02-21 DIAGNOSIS — L570 Actinic keratosis: Secondary | ICD-10-CM | POA: Diagnosis not present

## 2023-02-21 DIAGNOSIS — L718 Other rosacea: Secondary | ICD-10-CM | POA: Diagnosis not present

## 2023-03-06 ENCOUNTER — Telehealth: Payer: Self-pay | Admitting: Pharmacist

## 2023-03-06 NOTE — Progress Notes (Signed)
Care Management & Coordination Services Pharmacy Team  Reason for Encounter: Appointment Reminder  Contacted patient to confirm telephone appointment with Erskine Emery, PharmD on 03/07/2023 at 4 pm. Unsuccessful outreach. Left voicemail with appointment details.   Star Rating Drugs:  Atorvastatin 10 mg last filled 12/28/2022 90 DS   Care Gaps: Annual wellness visit in last year? Yes   Future Appointments  Date Time Provider Department Center  03/07/2023  4:00 PM Erroll Luna, [place] [facility] None  05/05/2023  1:00 PM Shelva Majestic, MD [facility]  08/21/2023  2:00 PM [facility] ANNUAL WELLNESS VISIT 1 [facility]

## 2023-03-07 ENCOUNTER — Ambulatory Visit: Payer: Medicare HMO | Admitting: Pharmacist

## 2023-03-07 NOTE — Progress Notes (Signed)
Care Management & Coordination Services Pharmacy Note  03/07/2023 Name:  [name] MRN:  [id number] DOB:  07/13/1946  Summary: PharmD FU.  Tolerating new amlodipine 2.5mg  well.  BP 117-120s/70s.  Denies any dizziness.  He is also working on decreasing sodium intake.  Recommendations/Changes made from today's visit: No changes, has PCP Fu in June  Follow up plan: FU 6 months   Subjective: [name] is an 77 y.o. year old male who is a primary patient of Durene Cal, Aldine Contes, MD.  The care coordination team was consulted for assistance with disease management and care coordination needs.    Engaged with patient by telephone for follow up visit.  Recent office visits:  11/04/2022 OV (PCP) Shelva Majestic, MD; no medication changes indicated.   Recent consult visits:  None   Hospital visits:  None in previous 6 months   Objective:  Lab Results  Component Value Date   CREATININE 1.18 01/25/2023   BUN 14 01/25/2023   GFR 60.27 01/25/2023   GFRNONAA 58 (L) 10/19/2020   GFRAA 68 10/19/2020   NA 139 01/25/2023   K 3.5 01/25/2023   CALCIUM 9.5 01/25/2023   CO2 31 01/25/2023   GLUCOSE 123 (H) 01/25/2023    Lab Results  Component Value Date/Time   HGBA1C 5.8 11/04/2022 10:40 AM   HGBA1C 5.9 04/18/2022 03:09 PM   GFR 60.27 01/25/2023 10:40 AM   GFR 50.40 (L) 01/09/2023 11:51 AM    Last diabetic Eye exam: No results found for: "HMDIABEYEEXA"  Last diabetic Foot exam: No results found for: "HMDIABFOOTEX"   Lab Results  Component Value Date   CHOL 110 11/04/2022   HDL 31.80 (L) 11/04/2022   LDLCALC 57 11/04/2022   TRIG 108.0 11/04/2022   CHOLHDL 3 11/04/2022       Latest Ref Rng & Units 01/25/2023   10:40 AM 01/09/2023   11:51 AM 11/04/2022   10:40 AM  Hepatic Function  Total Protein 6.0 - 8.3 g/dL 6.3  6.3  6.8   Albumin 3.5 - 5.2 g/dL 3.7  3.6  4.0   AST 0 - 37 U/L 20  26  24    ALT 0 - 53 U/L 28  33  37   Alk Phosphatase 39 - 117 U/L 64  64   57   Total Bilirubin 0.2 - 1.2 mg/dL 0.9  0.9  0.8     Lab Results  Component Value Date/Time   TSH 1.11 01/25/2023 10:40 AM   TSH 1.16 04/18/2022 03:09 PM       Latest Ref Rng & Units 01/25/2023   10:40 AM 01/09/2023   11:51 AM 11/04/2022   10:40 AM  CBC  WBC 4.0 - 10.5 K/uL 5.9  6.5  7.1   Hemoglobin 13.0 - 17.0 g/dL 95.2  84.1  32.4   Hematocrit 39.0 - 52.0 % 46.1  46.8  45.5   Platelets 150.0 - 400.0 K/uL 210.0  224.0  220.0     Lab Results  Component Value Date/Time   VITAMINB12 435 03/07/2014 12:01 AM    Clinical ASCVD: Yes  The ASCVD Risk score (Arnett DK, et al., 2019) failed to calculate for the following reasons:   The patient has a prior MI or stroke diagnosis         01/25/2023   10:04 AM 08/15/2022    1:59 PM 04/18/2022    2:06 PM  Depression screen PHQ 2/9  Decreased Interest 0 0 0  Down, Depressed, Hopeless  0 0 0  PHQ - 2 Score 0 0 0  Altered sleeping 2    Tired, decreased energy 1    Change in appetite 0    Feeling bad or failure about yourself  0    Trouble concentrating 0    Moving slowly or fidgety/restless 0    Suicidal thoughts 0    PHQ-9 Score 3    Difficult doing work/chores Not difficult at all       Social History   Tobacco Use  Smoking Status Former   Packs/day: 1.00   Years: 3.00   Additional pack years: 0.00   Total pack years: 3.00   Types: Cigarettes   Quit date: 11/14/1966   Years since quitting: 56.3  Smokeless Tobacco Former   Types: Chew  Tobacco Comments   quit 50 years ago   BP Readings from Last 3 Encounters:  02/14/23 110/64  01/25/23 134/78  01/09/23 90/60   Pulse Readings from Last 3 Encounters:  02/14/23 62  01/25/23 66  01/09/23 79   Wt Readings from Last 3 Encounters:  02/14/23 206 lb (93.4 kg)  01/25/23 205 lb 6.4 oz (93.2 kg)  01/09/23 204 lb (92.5 kg)   BMI Readings from Last 3 Encounters:  02/14/23 31.32 kg/m  01/25/23 31.23 kg/m  01/09/23 31.02 kg/m    Allergies  Allergen Reactions    Penicillins Rash    REACTION: rash   Sulfasalazine Rash    Medications Reviewed Today     Reviewed by Erroll Luna, [facility] (Pharmacist) on 03/07/23 at 1621  Med List Status: <None>   Medication Order Taking? Sig Documenting Provider Last Dose Status Informant  amLODipine (NORVASC) 2.5 MG tablet [id number] No Take 1 tablet (2.5 mg total) by mouth daily. Shelva Majestic, MD Taking Active   atenolol (TENORMIN) 25 MG tablet [id number] No TAKE (1/2) TABLET BY MOUTH ONCE DAILY. Shelva Majestic, MD Taking Active   atorvastatin (LIPITOR) 10 MG tablet [id number] No TAKE 1 TABLET EVERY DAY Shelva Majestic, MD Taking Active   clopidogrel (PLAVIX) 75 MG tablet [id number] No TAKE 1 TABLET EVERY DAY Shelva Majestic, MD Taking Active   doxycycline (VIBRAMYCIN) 50 MG capsule [id number] No Take 50 mg by mouth daily. For rosacea from dermatology [provider] Taking Active   fluticasone (CUTIVATE) 0.05 % cream [id number] No Apply topically 2 (two) times daily as needed. [provider] Taking Active   fluticasone (FLONASE) 50 MCG/ACT nasal spray [id number] No INHALE 1 SPRAY INTO EACH NOSTRIL ONCE DAILY. Shelva Majestic, MD Taking Active   irbesartan-hydrochlorothiazide (AVALIDE) 150-12.5 MG tablet [id number] No TAKE 2 TABLETS EVERY DAY Shelva Majestic, MD Taking Active   mercaptopurine (PURINETHOL) 50 MG tablet [id number] No Take 1.5 tablets (75 mg total) by mouth daily. Give on an empty stomach 1 hour before or 2 hours after meals. Meryl Dare, MD Taking Active   metroNIDAZOLE (METROGEL) 0.75 % gel [id number] No Apply 1 Application topically 2 (two) times daily. From dermatology for rosacea [provider] Taking Active   nitroGLYCERIN (NITROSTAT) 0.4 MG SL tablet [id number] No PLACE 1 TAB UNDER TONGUE EVERY 5 MIN IF NEEDED FOR CHEST PAIN. MAY USE 3 TIMES.NO RELIEF CALL 911. Shelva Majestic, MD Taking Active   ondansetron [facility]) 4 MG tablet [id number] No Take 1 tablet (4  mg total) by mouth every 8 (eight) hours as needed for nausea or vomiting. Jarold Motto, [place] Taking Active   potassium chloride SA (KLOR-CON) 20 MEQ  tablet [id number] No Take 1 tablet as needed daily as instructed by physician Shelva Majestic, MD Taking Active   tamsulosin [facility]) 0.4 MG CAPS capsule [id number] No Take 0.4 mg by mouth daily.  [provider] Taking Active             SDOH:  (Social Determinants of Health) assessments and interventions performed: NO, done within past year Financial Resource Strain: Low Risk  (08/15/2022)   Overall Financial Resource Strain (CARDIA)    Difficulty of Paying Living Expenses: Not hard at all   Food Insecurity: No Food Insecurity (08/15/2022)   Hunger Vital Sign    Worried About Running Out of Food in the Last Year: Never true    Ran Out of Food in the Last Year: Never true    SDOH Interventions    Flowsheet Row Office Visit from 01/25/2023 in [facility] Clinical Support from 08/15/2022 in [facility]  SDOH Interventions    Food Insecurity Interventions -- Intervention Not Indicated  Housing Interventions -- Intervention Not Indicated  Transportation Interventions -- Intervention Not Indicated  Depression Interventions/Treatment  PHQ2-9 Score <4 Follow-up Not Indicated --  Financial Strain Interventions -- Intervention Not Indicated  Physical Activity Interventions -- Intervention Not Indicated  Stress Interventions -- Intervention Not Indicated  Social Connections Interventions -- Intervention Not Indicated       Medication Assistance: None required.  Patient affirms current coverage meets needs.  Medication Access: Within the past 30 days, how often has patient missed a dose of medication? 0 Is a pillbox or other method used to improve adherence? Yes  Factors that may affect medication adherence? no barriers identified Are meds synced by current pharmacy? No  Are meds  delivered by current pharmacy? Yes  Does patient experience delays in picking up medications due to transportation concerns? No   [facility] Services Reviewed: Is patient disadvantaged to use [facility]?: Yes  Current Rx insurance plan: [employer] Name and location of Current pharmacy:  [facility], [place] - [address] [place] [address] Phone: [phone] Fax: [phone]  [facility] - [place], [place] - [address] [place] [address] Phone: [phone] Fax: [phone]  [facility] [address] [place] - [address] [place] [address] [place] [address] [place] [address] Phone: [phone] Fax: [phone]  [facility] services reviewed with patient today?: Yes  Patient requests to transfer care to [facility]?: No  Reason patient declined to change pharmacies: Disadvantaged due to insurance/mail order  Compliance/Adherence/Medication fill history: Atorvastatin 10 mg last filled 12/28/2022 90 DS     Care Gaps: Annual wellness visit in last year? Yes   Assessment/Plan     Hypertension (BP goal <130/80) 03/07/23 -Controlled -Current treatment: Irbesartan 150-12.5mg  Appropriate, Effective, Safe, Accessible Atenolol 12.5mg  daily Appropriate, Effective, Safe, Accessible Amlodipine 2.5mg  Appropriate, Query effective, ,  -Medications previously tried: none noted  -Current home readings: Since starting amlodipine - 117/70 -Denies hypotensive/hypertensive symptoms -Educated on BP goals and benefits of medications for prevention of heart attack, stroke and kidney damage; -Counseled to monitor BP at home a few times per week, document, and provide log at future appointments -Recommended to continue current medication -Recently started amlodipine 2.5mg  daily due to some elevated BP - reports this has been working well.  He is tolerating fine so far.  See above  for Home BP reported - this is average it has been since starting  new med.  Denies any dizziness or HA, denies swelling.  Has FU with PCP in June.  Will FU after that to assess BP.  Hyperlipidemia/CAD: (LDL goal < 70) 03/07/23 -Controlled, most recent LDL is 57 -Current treatment: Atorvastatin  Appropriate, Effective, Safe, Accessible -Medications previously tried: none noted  -Current dietary patterns: see previous -Current exercise habits: see previous, unchanged -Educated on Cholesterol goals;  Benefits of statin for ASCVD risk reduction; Importance of limiting foods high in cholesterol; -Recommended to continue current medication -LDL controlled, no changes needed       Willa Frater, PharmD Clinical Pharmacist  [facility] [phone] /

## 2023-04-21 ENCOUNTER — Other Ambulatory Visit: Payer: Self-pay | Admitting: Family Medicine

## 2023-05-05 ENCOUNTER — Ambulatory Visit (INDEPENDENT_AMBULATORY_CARE_PROVIDER_SITE_OTHER): Payer: Medicare HMO | Admitting: Family Medicine

## 2023-05-05 ENCOUNTER — Encounter: Payer: Self-pay | Admitting: Family Medicine

## 2023-05-05 VITALS — BP 128/76 | HR 57 | Ht 68.0 in | Wt 206.0 lb

## 2023-05-05 DIAGNOSIS — E785 Hyperlipidemia, unspecified: Secondary | ICD-10-CM

## 2023-05-05 DIAGNOSIS — I2510 Atherosclerotic heart disease of native coronary artery without angina pectoris: Secondary | ICD-10-CM | POA: Diagnosis not present

## 2023-05-05 DIAGNOSIS — M25561 Pain in right knee: Secondary | ICD-10-CM

## 2023-05-05 DIAGNOSIS — I10 Essential (primary) hypertension: Secondary | ICD-10-CM

## 2023-05-05 DIAGNOSIS — N183 Chronic kidney disease, stage 3 unspecified: Secondary | ICD-10-CM | POA: Diagnosis not present

## 2023-05-05 NOTE — Progress Notes (Signed)
Phone [phone] In person visit   Subjective:   [name] is a 77 y.o. year old very pleasant male patient who presents for/with See problem oriented charting Chief Complaint  Patient presents with   Medical Management of Chronic Issues    RM 6, non fasting, 6 month follow up hypertension, patient feels he is doing better   Past Medical History-  Patient Active Problem List   Diagnosis Date Noted   ULCERATIVE COLITIS-LEFT SIDE 07/03/2008    Priority: High   CAD (coronary artery disease) 06/15/2007    Priority: High   Aortic atherosclerosis (HCC) 04/20/2021    Priority: Medium    Hyperglycemia 06/08/2015    Priority: Medium    CKD (chronic kidney disease), stage III (HCC) 10/24/2014    Priority: Medium    Sleep apnea 07/02/2008    Priority: Medium    Hyperlipidemia 06/14/2007    Priority: Medium    Essential hypertension 06/14/2007    Priority: Medium    History of colonic polyps 06/14/2007    Priority: Medium    Postherpetic neuralgia 03/19/2018    Priority: Low   Balanitis 03/06/2017    Priority: Low   OA (osteoarthritis) of knee 12/07/2015    Priority: Low   Former smoker 06/08/2015    Priority: Low   Edema 02/04/2015    Priority: Low   Allergic rhinitis 10/24/2014    Priority: Low   Obesity (BMI 30-39.9) 04/25/2014    Priority: Low   Right cervical radiculopathy 04/12/2013    Priority: Low   Low back pain 01/11/2008    Priority: Low   GERD 11/07/2007    Priority: Low   Trigger finger, acquired 10/23/2007    Priority: Low   Actinic keratosis 10/10/2018    Medications- reviewed and updated Current Outpatient Medications  Medication Sig Dispense Refill   amLODipine (NORVASC) 2.5 MG tablet Take 1 tablet (2.5 mg total) by mouth daily. 90 tablet 1   atenolol (TENORMIN) 25 MG tablet TAKE (1/2) TABLET BY MOUTH ONCE DAILY. 45 tablet 0   atorvastatin (LIPITOR) 10 MG tablet TAKE 1 TABLET EVERY DAY 90 tablet 3   clopidogrel (PLAVIX) 75 MG tablet  TAKE 1 TABLET EVERY DAY 90 tablet 3   doxycycline (VIBRAMYCIN) 50 MG capsule Take 50 mg by mouth daily. For rosacea from dermatology     fluticasone (CUTIVATE) 0.05 % cream Apply topically 2 (two) times daily as needed.     fluticasone (FLONASE) 50 MCG/ACT nasal spray INHALE 1 SPRAY INTO EACH NOSTRIL ONCE DAILY. 16 g 0   irbesartan-hydrochlorothiazide (AVALIDE) 150-12.5 MG tablet TAKE 2 TABLETS EVERY DAY 180 tablet 3   mercaptopurine (PURINETHOL) 50 MG tablet Take 1.5 tablets (75 mg total) by mouth daily. Give on an empty stomach 1 hour before or 2 hours after meals. 45 tablet 11   metroNIDAZOLE (METROGEL) 0.75 % gel Apply 1 Application topically 2 (two) times daily. From dermatology for rosacea     nitroGLYCERIN (NITROSTAT) 0.4 MG SL tablet PLACE 1 TAB UNDER TONGUE EVERY 5 MIN IF NEEDED FOR CHEST PAIN. MAY USE 3 TIMES.NO RELIEF CALL 911. 25 tablet 0   ondansetron (ZOFRAN) 4 MG tablet Take 1 tablet (4 mg total) by mouth every 8 (eight) hours as needed for nausea or vomiting. 20 tablet 0   potassium chloride SA (KLOR-CON) 20 MEQ tablet Take 1 tablet as needed daily as instructed by physician 30 tablet 3   tamsulosin (FLOMAX) 0.4 MG CAPS capsule Take 0.4 mg by mouth daily.  No current facility-administered medications for this visit.     Objective:  BP 128/76   Pulse (!) 57   Ht 5\' 8"  (1.727 m)   Wt 206 lb (93.4 kg)   SpO2 99%   BMI 31.32 kg/m  Gen: NAD, resting comfortably CV: RRR no murmurs rubs or gallops Lungs: CTAB no crackles, wheeze, rhonchi Ext: no edema Skin: warm, dry Right knee pain over quadriceps tendon- otherwise normal knee exam    Assessment and Plan   #social update- new greatgrandson in [place] - may travel to see him. Sounds like had volvulus and also had to have appendix removed at young age-was 9 weeks old or so  #CAD- followed with Dr. Tenny Craw #hyperlipidemia/ aortic atherosclerosis  S: Medication: Atorvastatin 10 mg daily, Plavix 75 mg -no chest pain or  shortness of breath - tsaying active around the home Lab Results  Component Value Date   CHOL 110 11/04/2022   HDL 31.80 (L) 11/04/2022   LDLCALC 57 11/04/2022   TRIG 108.0 11/04/2022   CHOLHDL 3 11/04/2022   A/P: For CAD-asymptomatic continue current medications  For hyperlipidemia-well controlled continue current medications   Aortic atherosclerosis (presumed stable)- LDL goal ideally <70 - at goal- continue current medications   #hypertension S: medication: Atenolol 12.5 mg daily, Irbesartan-hctz 150-12.5 mg in am and pm , then amlodipine 2.5 mg around dinner time Home readings #s: 119/68 BP Readings from Last 3 Encounters:  05/05/23 128/76  02/14/23 110/64  01/25/23 134/78  A/P: doing much better now on amlodipine- continue current medications as well controlled   # right knee pain S:put down a new floor for his daughter and has noted some knee pain with getting up and down. Ok once walking.  Did use knee pads but not the best. Tylenol does not help knee much. Aspercreme hasn't helped. No injury other than being down on it.  A/P: appears to be a quadricept strain/tendonitis - Diclofenac/Voltaren gel trial 4x a day for 2 weeks and try to take it relatively easy during this time- let me refer you to sports medicine if you do not improve or worsen within the 2 weeks   #Chronic kidney disease stage III- GFR actually hair above 60 last check- will defer repeat to physical-  GFR is typically in the high 50s range -Patient knows to avoid NSAIDs    # Hyperglycemia/insulin resistance/prediabetes- peak a1c 6.14 April 2021- down to 5.8 last check- check again at physical   Recommended follow up: Return in about 6 months (around 11/04/2023) for physical or sooner if needed.Schedule b4 you leave. Future Appointments  Date Time Provider Department Center  08/21/2023  2:00 PM [facility] ANNUAL WELLNESS VISIT 1 [facility]    Lab/Order associations:   ICD-10-CM   1. Coronary artery disease  involving native coronary artery of native heart without angina pectoris  I25.10     2. Essential hypertension  I10     3. Hyperlipidemia, unspecified hyperlipidemia type  E78.5     4. Stage 3 chronic kidney disease, unspecified whether stage 3a or 3b CKD (HCC)  N18.30       No orders of the defined types were placed in this encounter.   Return precautions advised.  Tana Conch, MD

## 2023-05-05 NOTE — Patient Instructions (Addendum)
Diclofenac/Voltaren gel trial 4x a day for 2 weeks and try to take it relatively easy during this time- let me refer you to sports medicine if you do not improve or worsen within the 2 weeks  Glad you are doing so well- hope you are able to see that great grandchild before I see you back!   Recommended follow up: Return in about 6 months (around 11/04/2023) for physical or sooner if needed.Schedule b4 you leave.

## 2023-05-06 ENCOUNTER — Other Ambulatory Visit: Payer: Self-pay | Admitting: Gastroenterology

## 2023-05-09 ENCOUNTER — Telehealth: Payer: Self-pay | Admitting: Gastroenterology

## 2023-05-09 DIAGNOSIS — B078 Other viral warts: Secondary | ICD-10-CM | POA: Diagnosis not present

## 2023-05-09 DIAGNOSIS — L853 Xerosis cutis: Secondary | ICD-10-CM | POA: Diagnosis not present

## 2023-05-09 MED ORDER — MERCAPTOPURINE 50 MG PO TABS: 75.0000 mg | ORAL_TABLET | Freq: Every day | ORAL | 2 refills | Status: DC

## 2023-05-09 NOTE — Telephone Encounter (Signed)
Inbound call from patient requesting a refill for mercaptopurine, patient stated he haven't take medication since Friday 6/21. He is schedule to see Dr.Stark 9/10.Please advise

## 2023-05-09 NOTE — Telephone Encounter (Signed)
Prescription sent to patient's pharmacy until scheduled appt. 

## 2023-05-17 ENCOUNTER — Other Ambulatory Visit: Payer: Self-pay | Admitting: Family Medicine

## 2023-05-23 MED ORDER — FLUTICASONE PROPIONATE 50 MCG/ACT NA SUSP: NASAL | 3 refills | Status: DC

## 2023-05-23 NOTE — Telephone Encounter (Signed)
Patient states he is at pharmacy and was informed that rx order for flonase wasn't received. States he was able to refill norvasc. Could this be resent?

## 2023-05-23 NOTE — Addendum Note (Signed)
Addended by: Gwenette Greet on: 05/23/2023 04:01 PM   Modules accepted: Orders

## 2023-05-23 NOTE — Telephone Encounter (Signed)
-   Flonase resent.

## 2023-06-25 ENCOUNTER — Ambulatory Visit
Admission: EM | Admit: 2023-06-25 | Discharge: 2023-06-25 | Disposition: A | Discharge status: Home / Self Care | Payer: Medicare HMO | Attending: Family Medicine | Admitting: Family Medicine

## 2023-06-25 DIAGNOSIS — U071 COVID-19: Secondary | ICD-10-CM | POA: Insufficient documentation

## 2023-06-25 DIAGNOSIS — R059 Cough, unspecified: Secondary | ICD-10-CM | POA: Diagnosis present

## 2023-06-25 DIAGNOSIS — J069 Acute upper respiratory infection, unspecified: Secondary | ICD-10-CM

## 2023-06-25 NOTE — ED Triage Notes (Signed)
Pt reports having sore throat, cough and  congestion. Pt states he woke up this morning with diarrhea, and lack of appetite.   Started: Friday   Home interventions: mucinex

## 2023-06-25 NOTE — ED Provider Notes (Signed)
[facility]    CSN: [id number] Arrival date & time: 06/25/23  8756      History   Chief Complaint Chief Complaint  Patient presents with   Sore Throat   Cough   Nasal Congestion    HPI [name] is a 77 y.o. male.   Patient presenting today with 2-day history of sore throat, cough, congestion, diarrhea, lack of appetite.  Denies fever, chills, body aches, chest pain, shortness of breath, abdominal pain, nausea vomiting or diarrhea.  So far tried Mucinex with minimal relief.  No known chronic pulmonary disease.    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   UTI (urinary tract infection) 2019    Patient Active Problem List   Diagnosis Date Noted   Aortic atherosclerosis (HCC) 04/20/2021   Actinic keratosis 10/10/2018   Postherpetic neuralgia 03/19/2018   Balanitis 03/06/2017   OA (osteoarthritis) of knee 12/07/2015   Former smoker 06/08/2015   Hyperglycemia 06/08/2015   Edema 02/04/2015   Allergic rhinitis 10/24/2014   CKD (chronic kidney disease), stage III (HCC) 10/24/2014   Obesity (BMI 30-39.9) 04/25/2014   Right cervical radiculopathy 04/12/2013   ULCERATIVE COLITIS-LEFT SIDE 07/03/2008   Sleep apnea 07/02/2008   Low back pain 01/11/2008   GERD 11/07/2007   Trigger finger, acquired 10/23/2007   CAD (coronary artery disease) 06/15/2007   Hyperlipidemia 06/14/2007   Essential hypertension 06/14/2007   History of colonic polyps 06/14/2007    Past Surgical History:  Procedure  Laterality Date   ANGIOPLASTY  1993   CHOLECYSTECTOMY  2002   LUMBAR FUSION  1983   ROTATOR CUFF REPAIR  2009   right   TRIGGER FINGER RELEASE     right       Home Medications    Prior to Admission medications   Medication Sig Start Date End Date Taking? Authorizing Provider  amLODipine (NORVASC) 2.5 MG tablet Take 1 tablet (2.5 mg total) by mouth daily. 05/17/23   Shelva Majestic, MD  atenolol (TENORMIN) 25 MG tablet TAKE (1/2) TABLET BY MOUTH ONCE DAILY. 04/21/23   Shelva Majestic, MD  atorvastatin (LIPITOR) 10 MG tablet TAKE 1 TABLET EVERY DAY 12/28/22   Shelva Majestic, MD  clopidogrel (PLAVIX) 75 MG tablet TAKE 1 TABLET EVERY DAY 12/28/22   Shelva Majestic, MD  doxycycline (VIBRAMYCIN) 50 MG capsule Take 50 mg by mouth daily. For rosacea from dermatology 01/18/23   [provider]  fluticasone (CUTIVATE) 0.05 % cream Apply topically 2 (two) times daily as needed. 04/13/21   [provider]  fluticasone (FLONASE) 50 MCG/ACT nasal spray INHALE 1 SPRAY INTO EACH NOSTRIL ONCE DAILY. 05/23/23   Shelva Majestic, MD  irbesartan-hydrochlorothiazide (AVALIDE) 150-12.5 MG tablet TAKE 2 TABLETS EVERY DAY 11/11/22   Shelva Majestic, MD  mercaptopurine (PURINETHOL) 50 MG tablet Take 1.5 tablets (75 mg total) by mouth daily. Give on an empty stomach 1 hour before or 2 hours after meals.  05/09/23   Meryl Dare, MD  metroNIDAZOLE (METROGEL) 0.75 % gel Apply 1 Application topically 2 (two) times daily. From dermatology for rosacea 01/18/23   [provider]  nitroGLYCERIN (NITROSTAT) 0.4 MG SL tablet PLACE 1 TAB UNDER TONGUE EVERY 5 MIN IF NEEDED FOR CHEST PAIN. MAY USE 3 TIMES.NO RELIEF CALL 911. 09/21/22   Shelva Majestic, MD  ondansetron (ZOFRAN) 4 MG tablet Take 1 tablet (4 mg total) by mouth every 8 (eight) hours as needed for nausea or vomiting. 01/09/23   Jarold Motto, PA  potassium chloride SA (KLOR-CON) 20 MEQ tablet Take 1 tablet as needed daily as  instructed by physician 04/21/21   Shelva Majestic, MD  tamsulosin (FLOMAX) 0.4 MG CAPS capsule Take 0.4 mg by mouth daily.  01/17/18   [provider]    Family History Family History  Problem Relation Age of Onset   Stroke Mother    COPD Father    Heart disease Sister        valve replacement   CAD Brother    Heart attack Brother    Colon cancer Neg Hx    Esophageal cancer Neg Hx    Liver disease Neg Hx    Diabetes Neg Hx    Rectal cancer Neg Hx    Stomach cancer Neg Hx     Social History Social History   Tobacco Use   Smoking status: Former    Current packs/day: 0.00    Average packs/day: 1 pack/day for 3.0 years (3.0 ttl pk-yrs)    Types: Cigarettes    Start date: 11/15/1963    Quit date: 11/14/1966    Years since quitting: 56.6   Smokeless tobacco: Former    Types: Chew   Tobacco comments:    quit 50 years ago  Vaping Use   Vaping status: Never Used  Substance Use Topics   Alcohol use: No    Alcohol/week: 0.0 standard drinks of alcohol   Drug use: No     Allergies   Penicillins and Sulfasalazine   Review of Systems Review of Systems Per HPI  Physical Exam Triage Vital Signs ED Triage Vitals [06/25/23 1011]  Encounter Vitals Group     BP 109/63     Systolic BP Percentile      Diastolic BP Percentile      Pulse Rate 63     Resp 18     Temp 98 F (36.7 C)     Temp Source Oral     SpO2 98 %     Weight      Height      Head Circumference      Peak Flow      Pain Score 6     Pain Loc      Pain Education      Exclude from Growth Chart    No data found.  Updated Vital Signs BP 109/63 (BP Location: Right Arm)   Pulse 63   Temp 98 F (36.7 C) (Oral)   Resp 18   SpO2 98%   Visual Acuity Right Eye Distance:   Left Eye Distance:   Bilateral Distance:    Right Eye Near:   Left Eye Near:    Bilateral Near:     Physical Exam Vitals and nursing note reviewed.  Constitutional:      Appearance: He is well-developed.  HENT:      Head: Atraumatic.     Right Ear: External ear normal.  Left Ear: External ear normal.     Nose: Rhinorrhea present.     Mouth/Throat:     Pharynx: No oropharyngeal exudate or posterior oropharyngeal erythema.  Eyes:     Conjunctiva/sclera: Conjunctivae normal.     Pupils: Pupils are equal, round, and reactive to light.  Cardiovascular:     Rate and Rhythm: Normal rate and regular rhythm.  Pulmonary:     Effort: Pulmonary effort is normal. No respiratory distress.     Breath sounds: No wheezing or rales.  Musculoskeletal:        General: Normal range of motion.     Cervical back: Normal range of motion and neck supple.  Lymphadenopathy:     Cervical: No cervical adenopathy.  Skin:    General: Skin is warm and dry.  Neurological:     Mental Status: He is alert and oriented to person, place, and time.  Psychiatric:        Behavior: Behavior normal.      UC Treatments / Results  Labs (all labs ordered are listed, but only abnormal results are displayed) Labs Reviewed  SARS CORONAVIRUS 2 (TAT 6-24 HRS)    EKG   Radiology No results found.  Procedures Procedures (including critical care time)  Medications Ordered in UC Medications - No data to display  Initial Impression / Assessment and Plan / UC Course  I have reviewed the triage vital signs and the nursing notes.  Pertinent labs & imaging results that were available during my care of the patient were reviewed by me and considered in my medical decision making (see chart for details).     Vitals and exam reassuring, suspicious for viral respiratory infection.  COVID testing pending, treat with supportive over-the-counter medications, home care.  Good candidate for molnupiravir if positive for COVID.  Final Clinical Impressions(s) / UC Diagnoses   Final diagnoses:  Viral URI     Discharge Instructions      Your COVID result should be available tomorrow and someone will call if positive.  In the  meantime, you may take Coricidin HBP, Flonase, Mucinex, saline sinus rinses.    ED Prescriptions   None    PDMP not reviewed this encounter.   Particia Nearing, [place] 06/25/23 1101

## 2023-06-25 NOTE — Discharge Instructions (Signed)
Your COVID result should be available tomorrow and someone will call if positive.  In the meantime, you may take Coricidin HBP, Flonase, Mucinex, saline sinus rinses.

## 2023-06-27 ENCOUNTER — Telehealth (HOSPITAL_COMMUNITY): Payer: Self-pay | Admitting: Emergency Medicine

## 2023-06-27 MED ORDER — MOLNUPIRAVIR EUA 200MG CAPSULE: 4.0000 | ORAL_CAPSULE | Freq: Two times a day (BID) | ORAL | 0 refills | Status: AC

## 2023-07-04 ENCOUNTER — Ambulatory Visit: Payer: Medicare HMO | Admitting: Physician Assistant

## 2023-07-04 ENCOUNTER — Encounter: Payer: Self-pay | Admitting: Physician Assistant

## 2023-07-04 VITALS — BP 126/80 | HR 57 | Temp 97.5°F | Ht 68.0 in | Wt 206.0 lb

## 2023-07-04 DIAGNOSIS — J329 Chronic sinusitis, unspecified: Secondary | ICD-10-CM

## 2023-07-04 DIAGNOSIS — B9689 Other specified bacterial agents as the cause of diseases classified elsewhere: Secondary | ICD-10-CM | POA: Diagnosis not present

## 2023-07-04 MED ORDER — AZITHROMYCIN 250 MG PO TABS: ORAL_TABLET | ORAL | 0 refills | Status: AC

## 2023-07-04 NOTE — Patient Instructions (Signed)
It was great to see you!  Drink 6-8 oz water daily  Start zpack  Keep [place] posted on your symptoms  Take care,  Jarold Motto PA-C

## 2023-07-04 NOTE — Progress Notes (Signed)
[name] is a 77 y.o. male here for a follow up of a pre-existing problem.  History of Present Illness:   Chief Complaint  Patient presents with   Post COVID    Pt was Dx with COVID on 8/11 at Urgent Care in [place].   Sinus Problem    Pt c/o sinus pressure and headache since Sunday, taking Tylenol and Flonase, Mucinex.    HPI  Sinus Infection: Seen 8/11 by Roosvelt Maser PA-C at [facility] in [facility] for viral URI.  Tested positive for Covid-19.  States he completed prescribed Molnupiravir.  Presents today for sinus pain and congestion since Sunday 8/18.   Reports he has been using Tylenol, Flonase, and Mucinex to manage symptoms.  Denies ear pain or fever, shortness of breath, chest pain.   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   UTI (urinary tract infection) 2019     Social History   Tobacco Use   Smoking status: Former    Current packs/day: 0.00    Average packs/day: 1 pack/day for 3.0 years (3.0 ttl pk-yrs)    Types: Cigarettes    Start date: 11/15/1963    Quit date: 11/14/1966    Years since quitting: 56.6   Smokeless tobacco: Former    Types: Chew   Tobacco comments:    quit 50 years ago  Vaping Use   Vaping status: Never Used  Substance Use Topics   Alcohol use: No    Alcohol/week: 0.0 standard drinks of alcohol   Drug use: No    Past Surgical History:  Procedure Laterality Date   ANGIOPLASTY  1993   CHOLECYSTECTOMY  2002   LUMBAR FUSION  1983   ROTATOR CUFF REPAIR  2009   right    TRIGGER FINGER RELEASE     right    Family History  Problem Relation Age of Onset   Stroke Mother    COPD Father    Heart disease Sister        valve replacement   CAD Brother    Heart attack Brother    Colon cancer Neg Hx    Esophageal cancer Neg Hx    Liver disease Neg Hx    Diabetes Neg Hx    Rectal cancer Neg Hx    Stomach cancer Neg Hx     Allergies  Allergen Reactions   Penicillins Rash    REACTION: rash   Sulfasalazine Rash    Current Medications:   Current Outpatient Medications:    amLODipine (NORVASC) 2.5 MG tablet, Take 1 tablet (2.5 mg total) by mouth daily., Disp: 90 tablet, Rfl: 0   atenolol (TENORMIN) 25 MG tablet, TAKE (1/2) TABLET BY MOUTH ONCE DAILY., Disp: 45 tablet, Rfl: 0   atorvastatin (LIPITOR) 10 MG tablet, TAKE 1 TABLET EVERY DAY, Disp: 90 tablet, Rfl: 3   azithromycin (ZITHROMAX) 250 MG tablet, Take 2 tablets on day 1, then 1 tablet daily on days 2 through 5, Disp:  6 tablet, Rfl: 0   clopidogrel (PLAVIX) 75 MG tablet, TAKE 1 TABLET EVERY DAY, Disp: 90 tablet, Rfl: 3   doxycycline (VIBRAMYCIN) 50 MG capsule, Take 50 mg by mouth daily. For rosacea from dermatology, Disp: , Rfl:    fluticasone (CUTIVATE) 0.05 % cream, Apply topically 2 (two) times daily as needed., Disp: , Rfl:    fluticasone (FLONASE) 50 MCG/ACT nasal spray, INHALE 1 SPRAY INTO EACH NOSTRIL ONCE DAILY., Disp: 16 g, Rfl: 3   irbesartan-hydrochlorothiazide (AVALIDE) 150-12.5 MG tablet, TAKE 2 TABLETS EVERY DAY, Disp: 180 tablet, Rfl: 3   mercaptopurine (PURINETHOL) 50 MG tablet, Take 1.5 tablets (75 mg total) by mouth daily. Give on an empty stomach 1 hour before or 2 hours after meals., Disp: 45 tablet, Rfl: 2   metroNIDAZOLE (METROGEL) 0.75 % gel, Apply 1 Application topically 2 (two) times daily. From dermatology for rosacea, Disp: , Rfl:    nitroGLYCERIN (NITROSTAT) 0.4 MG SL tablet, PLACE 1 TAB UNDER TONGUE EVERY 5 MIN IF NEEDED FOR CHEST PAIN. MAY USE 3 TIMES.NO RELIEF CALL 911.,  Disp: 25 tablet, Rfl: 0   ondansetron (ZOFRAN) 4 MG tablet, Take 1 tablet (4 mg total) by mouth every 8 (eight) hours as needed for nausea or vomiting., Disp: 20 tablet, Rfl: 0   potassium chloride SA (KLOR-CON) 20 MEQ tablet, Take 1 tablet as needed daily as instructed by physician, Disp: 30 tablet, Rfl: 3   tamsulosin (FLOMAX) 0.4 MG CAPS capsule, Take 0.4 mg by mouth daily. , Disp: , Rfl:    Review of Systems:   Review of Systems  HENT:  Positive for congestion and sinus pain (mostly upper sinuses).     Vitals:   Vitals:   07/04/23 1445  BP: 126/80  Pulse: (!) 57  Temp: (!) 97.5 F (36.4 C)  TempSrc: Temporal  SpO2: 99%  Weight: 206 lb (93.4 kg)  Height: 5\' 8"  (1.727 m)     Body mass index is 31.32 kg/m.  Physical Exam:   Physical Exam Vitals and nursing note reviewed.  Constitutional:      General: He is not in acute distress.    Appearance: He is well-developed. He is not ill-appearing or toxic-appearing.  HENT:     Head: Normocephalic and atraumatic.     Right Ear: Tympanic membrane, ear canal and external ear normal. Tympanic membrane is not erythematous, retracted or bulging.     Left Ear: Tympanic membrane, ear canal and external ear normal. Tympanic membrane is not erythematous, retracted or bulging.     Nose:     Right Sinus: Frontal sinus tenderness present. No maxillary sinus tenderness.     Left Sinus: Frontal sinus tenderness present. No maxillary sinus tenderness.     Mouth/Throat:     Pharynx: Uvula midline. No posterior oropharyngeal erythema.  Eyes:     General: Lids are normal.     Conjunctiva/sclera: Conjunctivae normal.  Neck:     Trachea: Trachea normal.  Cardiovascular:     Rate and Rhythm: Normal rate and regular rhythm.     Pulses: Normal pulses.     Heart sounds: Normal heart sounds, S1 normal and S2 normal.  Pulmonary:     Effort: Pulmonary effort is normal.     Breath sounds: Normal breath sounds. No decreased breath sounds, wheezing,  rhonchi or rales.  Lymphadenopathy:     Cervical: No cervical adenopathy.  Skin:    General: Skin is warm and dry.  Neurological:     Mental Status:  He is alert.     GCS: GCS eye subscore is 4. GCS verbal subscore is 5. GCS motor subscore is 6.  Psychiatric:        Speech: Speech normal.        Behavior: Behavior normal. Behavior is cooperative.     Assessment and Plan:   Bacterial sinusitis No red flags on exam.  Will initiate azithromycin per orders. Discussed taking medications as prescribed. Reviewed return precautions including worsening fever, SOB, worsening cough or other concerns. Push fluids and rest. I recommend that patient follow-up if symptoms worsen or persist despite treatment x 7-10 days, sooner if needed.    I,Emily Lagle,acting as a Neurosurgeon for [employer], PA.,have documented all relevant documentation on the behalf of Jarold Motto, PA,as directed by  Jarold Motto, PA while in the presence of Jarold Motto, [place].  I, Jarold Motto, [place], have reviewed all documentation for this visit. The documentation on 07/04/23 for the exam, diagnosis, procedures, and orders are all accurate and complete.  Jarold Motto, PA-C

## 2023-07-10 DIAGNOSIS — N401 Enlarged prostate with lower urinary tract symptoms: Secondary | ICD-10-CM | POA: Diagnosis not present

## 2023-07-10 DIAGNOSIS — R3121 Asymptomatic microscopic hematuria: Secondary | ICD-10-CM | POA: Diagnosis not present

## 2023-07-10 DIAGNOSIS — R3915 Urgency of urination: Secondary | ICD-10-CM | POA: Diagnosis not present

## 2023-07-25 ENCOUNTER — Encounter: Payer: Self-pay | Admitting: Gastroenterology

## 2023-07-25 ENCOUNTER — Ambulatory Visit: Payer: Medicare HMO | Admitting: Gastroenterology

## 2023-07-25 VITALS — BP 124/70 | HR 74 | Ht 68.0 in | Wt 207.6 lb

## 2023-07-25 DIAGNOSIS — K5150 Left sided colitis without complications: Secondary | ICD-10-CM

## 2023-07-25 DIAGNOSIS — K219 Gastro-esophageal reflux disease without esophagitis: Secondary | ICD-10-CM | POA: Diagnosis not present

## 2023-07-25 MED ORDER — MERCAPTOPURINE 50 MG PO TABS: 75.0000 mg | ORAL_TABLET | Freq: Every day | ORAL | 5 refills | Status: DC

## 2023-07-25 MED ORDER — FAMOTIDINE 40 MG PO TABS: 40.0000 mg | ORAL_TABLET | Freq: Every day | ORAL | 11 refills | Status: DC

## 2023-07-25 NOTE — Progress Notes (Signed)
    Assessment     Left-sided ulcerative colitis, well-controlled, maintained on 6-MP GERD   Recommendations    Continue 6-MP 75 mg daily Increase famotidine to 40 mg daily, follow antireflux measures. If not famotidine 40 mg is not effective then will increase to 40 mg bid or change to a PPI qd Surveillance colonoscopy recommended in June 2025 Maintain close dermatology follow up REV in 6 months    HPI    This is a 77 year old male returning for follow-up of left-sided ulcerative colitis and GERD.  He was maintained on 6-MP for years and changed to sulfasalazine in April 2023.  His symptoms were not well controlled so we resumed 6-MP in June 2023. His UC symptoms have remained well-controlled since.  His reflux symptoms are active with frequent breakthrough on famotidine 20 mg daily.  He does not recall taking pantoprazole following his visit last year.   Labs / Imaging       Latest Ref Rng & Units 01/25/2023   10:40 AM 01/09/2023   11:51 AM 11/04/2022   10:40 AM  Hepatic Function  Total Protein 6.0 - 8.3 g/dL 6.3  6.3  6.8   Albumin 3.5 - 5.2 g/dL 3.7  3.6  4.0   AST 0 - 37 U/L 20  26  24    ALT 0 - 53 U/L 28  33  37   Alk Phosphatase 39 - 117 U/L 64  64  57   Total Bilirubin 0.2 - 1.2 mg/dL 0.9  0.9  0.8        Latest Ref Rng & Units 01/25/2023   10:40 AM 01/09/2023   11:51 AM 11/04/2022   10:40 AM  CBC  WBC 4.0 - 10.5 K/uL 5.9  6.5  7.1   Hemoglobin 13.0 - 17.0 g/dL 16.1  09.6  04.5   Hematocrit 39.0 - 52.0 % 46.1  46.8  45.5   Platelets 150.0 - 400.0 K/uL 210.0  224.0  220.0      Myocardial Perfusion Imaging   The study is normal. The study is low risk.   No ST deviation was noted.   Left ventricular function is normal. Nuclear stress EF: 70 %. The left  ventricular ejection fraction is hyperdynamic (>65%). End diastolic cavity  size is normal.   Prior study available for comparison from 03/05/2008.   Current Medications, Allergies, Past Medical History,  Past Surgical History, Family History and Social History were reviewed in [employer] record.   Physical Exam: General: Well developed, well nourished, no acute distress Head: Normocephalic and atraumatic Eyes: Sclerae anicteric, EOMI Ears: Normal auditory acuity Mouth: No deformities or lesions noted Lungs: Clear throughout to auscultation Heart: Regular rate and rhythm; No murmurs, rubs or bruits Abdomen: Soft, non tender and non distended. No masses, hepatosplenomegaly or hernias noted. Normal Bowel sounds Rectal: Not done Musculoskeletal: Symmetrical with no gross deformities  Pulses:  Normal pulses noted Extremities: No edema or deformities noted Neurological: Alert oriented x 4, grossly nonfocal Psychological:  Alert and cooperative. Normal mood and affect   Early Ord T. Russella Dar, MD 07/25/2023, 9:18 AM

## 2023-07-25 NOTE — Patient Instructions (Addendum)
We have sent the following medications to your pharmacy for you to pick up at your convenience: famotidine 40 mg daily and mercaptopurine 75 mg daily.  Call by the end of the month if your symptoms have not improved.   The [facility] providers would like to encourage you to use [facility] to communicate with providers for non-urgent requests or questions.  Due to long hold times on the telephone, sending your provider a message by [facility] may be a faster and more efficient way to get a response.  Please allow 48 business hours for a response.  Please remember that this is for non-urgent requests.   Thank you for choosing me and [facility].  Venita Lick. Pleas Koch., MD., Clementeen Graham

## 2023-08-21 ENCOUNTER — Ambulatory Visit (INDEPENDENT_AMBULATORY_CARE_PROVIDER_SITE_OTHER): Payer: Medicare HMO

## 2023-08-21 VITALS — BP 128/78 | HR 76 | Temp 98.0°F | Wt 208.6 lb

## 2023-08-21 DIAGNOSIS — Z0000 Encounter for general adult medical examination without abnormal findings: Secondary | ICD-10-CM

## 2023-08-21 NOTE — Patient Instructions (Signed)
[name] , Thank you for taking time to come for your Medicare Wellness Visit. I appreciate your ongoing commitment to your health goals. Please review the following plan we discussed and let me know if I can assist you in the future.   Referrals/Orders/Follow-Ups/Clinician Recommendations: Aim for 30 minutes of exercise or brisk walking, 6-8 glasses of water, and 5 servings of fruits and vegetables each day. Each day, aim for 6 glasses of water, plenty of protein in your diet and try to get up and walk/ stretch every hour for 5-10 minutes at a time.    This is a list of the screening recommended for you and due dates:  Health Maintenance  Topic Date Due   Medicare Annual Wellness Visit  08/16/2023   Flu Shot  02/12/2024*   Colon Cancer Screening  04/28/2024   Pneumonia Vaccine  Completed   Hepatitis C Screening  Completed   Zoster (Shingles) Vaccine  Completed   HPV Vaccine  Aged Out   DTaP/Tdap/Td vaccine  Discontinued   COVID-19 Vaccine  Discontinued  *Topic was postponed. The date shown is not the original due date.    Advanced directives: (Declined) Advance directive discussed with you today. Even though you declined this today, please call our office should you change your mind, and we can give you the proper paperwork for you to fill out.  Next Medicare Annual Wellness Visit scheduled for next year: Yes

## 2023-08-21 NOTE — Progress Notes (Signed)
Subjective:   [name] is a 77 y.o. male who presents for Medicare Annual/Subsequent preventive examination.  Visit Complete: In person   Because this visit was a virtual/telehealth visit, some criteria may be missing or patient reported. Any vitals not documented were not able to be obtained and vitals that have been documented are patient reported.    Cardiac Risk Factors include: advanced age (>56men, >55 women);dyslipidemia;hypertension;male gender;obesity (BMI >30kg/m2)     Objective:    Today's Vitals   08/21/23 1349  BP: 128/78  Pulse: 76  Temp: 98 F (36.7 C)  SpO2: 95%  Weight: 208 lb 9.6 oz (94.6 kg)   Body mass index is 31.72 kg/m.     08/21/2023    1:58 PM 11/10/2022    1:14 PM 08/15/2022    2:00 PM 08/05/2021    1:54 PM 07/30/2020    2:38 PM 07/03/2019   12:01 PM 03/07/2018    2:01 PM  Advanced Directives  Does Patient Have a Medical Advance Directive? No No No Yes No No No  Does patient want to make changes to medical advance directive?  No - Patient declined  Yes (MAU/Ambulatory/Procedural Areas - Information given)     Would patient like information on creating a medical advance directive? No - Patient declined  No - Patient declined   Yes (MAU/Ambulatory/Procedural Areas - Information given) No - Patient declined    Current Medications (verified) Outpatient Encounter Medications as of 08/21/2023  Medication Sig   amLODipine (NORVASC) 2.5 MG tablet Take 1 tablet (2.5 mg total) by mouth daily.   atenolol (TENORMIN) 25 MG tablet TAKE (1/2) TABLET BY MOUTH ONCE DAILY.   atorvastatin (LIPITOR) 10 MG tablet TAKE 1 TABLET EVERY DAY   clopidogrel (PLAVIX) 75 MG tablet TAKE 1 TABLET EVERY DAY   doxycycline (VIBRAMYCIN) 50 MG capsule Take 50 mg by mouth daily. For rosacea from dermatology   famotidine (PEPCID) 40 MG tablet Take 1 tablet (40 mg total) by mouth daily.   fluticasone (CUTIVATE) 0.05 % cream Apply topically 2 (two) times daily as needed.    fluticasone (FLONASE) 50 MCG/ACT nasal spray INHALE 1 SPRAY INTO EACH NOSTRIL ONCE DAILY.   irbesartan-hydrochlorothiazide (AVALIDE) 150-12.5 MG tablet TAKE 2 TABLETS EVERY DAY   mercaptopurine (PURINETHOL) 50 MG tablet Take 1.5 tablets (75 mg total) by mouth daily. Give on an empty stomach 1 hour before or 2 hours after meals.   metroNIDAZOLE (METROGEL) 0.75 % gel Apply 1 Application topically 2 (two) times daily. From dermatology for rosacea   ondansetron (ZOFRAN) 4 MG tablet Take 1 tablet (4 mg total) by mouth every 8 (eight) hours as needed for nausea or vomiting.   potassium chloride SA (KLOR-CON) 20 MEQ tablet Take 1 tablet as needed daily as instructed by physician   tamsulosin (FLOMAX) 0.4 MG CAPS capsule Take 0.4 mg by mouth daily.    nitroGLYCERIN (NITROSTAT) 0.4 MG SL tablet PLACE 1 TAB UNDER TONGUE EVERY 5 MIN IF NEEDED FOR CHEST PAIN. MAY USE 3 TIMES.NO RELIEF CALL 911. (Patient not taking: Reported on 08/21/2023)   No facility-administered encounter medications on file as of 08/21/2023.    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   UTI (urinary tract infection) 2019   Past Surgical History:  Procedure Laterality Date   ANGIOPLASTY  1993   CHOLECYSTECTOMY  2002   LUMBAR FUSION  1983   ROTATOR CUFF REPAIR  2009   right   TRIGGER FINGER RELEASE     right   Family History  Problem Relation Age of Onset   Stroke Mother    COPD Father    Heart disease  Sister        valve replacement   CAD Brother    Heart attack Brother    Colon cancer Neg Hx    Esophageal cancer Neg Hx    Liver disease Neg Hx    Diabetes Neg Hx    Rectal cancer Neg Hx    Stomach cancer Neg Hx    Social History   Socioeconomic History   Marital status: Widowed    Spouse name: Not on file   Number of children: 3   Years of education: Not on file   Highest education level: Not on file  Occupational History   Occupation: apartment maintenance    Employer: [employer]    Comment: retired  Tobacco Use   Smoking status: Former    Current packs/day: 0.00    Average packs/day: 1 pack/day for 3.0 years (3.0 ttl pk-yrs)    Types: Cigarettes    Start date: 11/15/1963    Quit date: 11/14/1966    Years since quitting: 56.8   Smokeless tobacco: Former    Types: Chew   Tobacco comments:    quit 50 years ago  Vaping Use   Vaping status: Never Used  Substance and Sexual Activity   Alcohol use: No    Alcohol/week: 0.0 standard drinks of alcohol   Drug use: No   Sexual activity: Not on file  Other Topics Concern   Not on file  Social History Narrative   Widower January 2019.. 3 children- son is a patient here. 4 grandkids. 1 greatgrandchildi n [place] in 2024.       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 to work on so a little at a time, mow the kids yards, enjoys yardwork      Pt does not get regular exercise--daily caffeine use-5 cups daily   Social Determinants of Health   Financial Resource Strain: Low Risk  (08/21/2023)   Overall Financial Resource Strain (CARDIA)    Difficulty of Paying Living Expenses: Not hard at all  Food Insecurity: No Food Insecurity (08/21/2023)   Hunger Vital Sign    Worried About Running Out of Food in the Last Year: Never true    Ran Out of Food in the Last Year: Never true  Transportation Needs: No Transportation Needs (08/21/2023)   PRAPARE - Doctor, general practice (Medical): No    Lack of Transportation (Non-Medical): No  Physical Activity: Insufficiently Active (08/21/2023)   Exercise Vital Sign    Days of Exercise per Week: 3 days    Minutes of Exercise per Session: 20 min  Stress: No Stress Concern Present (08/21/2023)   [employer] of Occupational Health - Occupational Stress Questionnaire  Feeling of Stress : Not at all  Social Connections: Moderately Isolated (08/21/2023)   Social Connection and Isolation Panel [NHANES]    Frequency of Communication with Friends and Family: More than three times a week    Frequency of Social Gatherings with Friends and Family: Three times a week    Attends Religious Services: More than 4 times per year    Active Member of Clubs or Organizations: No    Attends Banker Meetings: Never    Marital Status: Widowed    Tobacco Counseling Counseling given: Not Answered Tobacco comments: quit 50 years ago   Clinical Intake:  Pre-visit preparation completed: Yes  Pain : No/denies pain     BMI - recorded: 31.72 Nutritional Status: BMI > 30  Obese Nutritional Risks: None Diabetes: No  How often do you need to have someone help you when you read instructions, pamphlets, or other written materials from your doctor or pharmacy?: 1 - Never  Interpreter Needed?: No  Information entered by :: Lanier Ensign, LPN   Activities of Daily Living    08/21/2023    1:53 PM  In your present state of health, do you have any difficulty performing the following activities:  Hearing? 1  Comment slight HOH  Vision? 0  Difficulty concentrating or making decisions? 0  Walking or climbing stairs? 0  Dressing or bathing? 0  Doing errands, shopping? 0  Preparing Food and eating ? N  Using the Toilet? N  In the past six months, have you accidently leaked urine? N  Do you have problems with loss of bowel control? N  Managing your Medications? N  Managing your Finances? N   Housekeeping or managing your Housekeeping? N    Patient Care Team: Shelva Majestic, MD as PCP - General (Family Medicine) Pricilla Riffle, MD as PCP - Cardiology (Cardiology) Dahlia Byes, [facility] (Inactive) as Pharmacist (Pharmacist) Erroll Luna, [facility] (Inactive) (Pharmacist)  Indicate any recent Medical Services you may have received from other than [facility] providers in the past year (date may be approximate).     Assessment:   This is a routine wellness examination for Bassel.  Hearing/Vision screen Hearing Screening - Comments:: Pt stated slight HOH  Vision Screening - Comments:: Pt follows up with [facility] for annual  eye exams    Goals Addressed             This Visit's Progress    Patient Stated       Lose weight        Depression Screen    08/21/2023    1:59 PM 05/05/2023   12:59 PM 01/25/2023   10:04 AM 08/15/2022    1:59 PM 04/18/2022    2:06 PM 08/05/2021    1:51 PM 04/20/2021    9:42 AM  PHQ 2/9 Scores  PHQ - 2 Score 0 1 0 0 0 1 0  PHQ- 9 Score  3 3    1     Fall Risk    08/21/2023    2:00 PM 05/05/2023   12:58 PM 01/25/2023   10:04 AM 08/15/2022    2:01 PM 04/18/2022    2:06 PM  Fall Risk   Falls in the past year? 0 0 0 0 0  Number falls in past yr: 0  0 0 0  Injury with Fall? 0  0 0 0  Risk for fall due to : Impaired vision  No Fall Risks Impaired vision No Fall Risks  Follow up Falls prevention discussed  Falls evaluation completed Falls prevention discussed     MEDICARE RISK AT HOME: Medicare Risk at Home Any stairs in or around the home?: No If so, are there any without handrails?: No Home free of loose throw rugs in walkways, pet beds, electrical cords, etc?: Yes Adequate lighting in your home to reduce risk of falls?: Yes Life alert?: No Use of a cane, walker or w/c?: No Grab bars in the bathroom?: No Shower chair or bench in shower?: No Elevated toilet seat or a handicapped toilet?: No  TIMED UP AND GO:  Was the test performed?  Yes   Length of time to ambulate 10 feet: 15 sec Gait steady and fast without use of assistive device    Cognitive Function:    03/01/2018    8:32 AM 10/18/2016    1:34 PM  MMSE - Mini Mental State Exam  Not completed: --   Orientation to time  5  Orientation to Place  5  Registration  3  Attention/ Calculation  5  Recall  3  Language- name 2 objects  2  Language- repeat  1  Language- follow 3 step command  3  Language- read & follow direction  1  Write a sentence  1  Copy design  1  Total score  30        08/21/2023    2:01 PM 08/15/2022    2:02 PM 08/05/2021    1:56 PM 07/30/2020    2:42 PM  6CIT Screen  What Year? 0 points 0 points 0 points 0 points  What month? 0 points 0 points 0 points 0 points  What time? 0 points 0 points 0 points   Count back from 20 0 points 0 points 0 points 0 points  Months in reverse 0 points 2 points 0 points 0 points  Repeat phrase 0 points 0 points 0 points 0 points  Total Score 0 points 2 points 0 points     Immunizations Immunization History  Administered Date(s) Administered   Fluad Quad(high Dose 65+) 08/31/2020, 09/13/2021, 08/31/2022   Influenza Split 08/19/2011, 08/17/2012   Influenza Whole 10/23/2007, 08/14/2009, 08/13/2010, 08/23/2019   Influenza, High Dose Seasonal PF 09/13/2016, 09/01/2017, 08/22/2018, 08/21/2019   Influenza,inj,Quad PF,6+ Mos 08/16/2013   Influenza-Unspecified 08/28/2014, 09/07/2015, 09/01/2017, 09/13/2018   Moderna Sars-Covid-2 Vaccination 12/27/2019, 01/24/2020, 10/04/2020, 06/25/2021   Pneumococcal Conjugate-13 06/08/2015   Pneumococcal Polysaccharide-23 04/25/2014   Td 11/14/2000, 09/21/2010   Tdap 11/11/2022   Zoster Recombinant(Shingrix) 09/24/2018, 11/11/2022    TDAP status: Due, Education has been provided regarding the importance of this vaccine. Advised may receive this vaccine at local pharmacy or Health Dept. Aware to provide a copy of the vaccination record if obtained from local pharmacy or  Health Dept. Verbalized acceptance and understanding.  Flu Vaccine status: Due, Education has been provided regarding the importance of this vaccine. Advised may receive this vaccine at local pharmacy or Health Dept. Aware to provide a copy of the vaccination record if obtained from local pharmacy or Health Dept. Verbalized acceptance and understanding.  Pneumococcal vaccine status: Up to date  Covid-19 vaccine status: Information provided on how to obtain vaccines.   Qualifies for Shingles Vaccine? Yes   Zostavax completed Yes   Shingrix Completed?: Yes  Screening Tests Health Maintenance  Topic Date Due   INFLUENZA VACCINE  02/12/2024 (Originally 06/15/2023)   Colonoscopy  04/28/2024   Medicare Annual Wellness (AWV)  08/20/2024   Pneumonia Vaccine 65+ Years  old  Completed   Hepatitis C Screening  Completed   Zoster Vaccines- Shingrix  Completed   HPV VACCINES  Aged Out   DTaP/Tdap/Td  Discontinued   COVID-19 Vaccine  Discontinued    Health Maintenance  There are no preventive care reminders to display for this patient.   Colorectal cancer screening: Type of screening: Colonoscopy. Completed 04/28/21. Repeat every 3 years  Additional Screening:  Hepatitis C Screening:  Completed 09/13/16  Vision Screening: Recommended annual ophthalmology exams for early detection of glaucoma and other disorders of the eye. Is the patient up to date with their annual eye exam?  Yes  Who is the provider or what is the name of the office in which the patient attends annual eye exams? [facility]  If pt is not established with a provider, would they like to be referred to a provider to establish care? No .   Dental Screening: Recommended annual dental exams for proper oral hygiene   Community Resource Referral / Chronic Care Management: CRR required this visit?  No   CCM required this visit?  No     Plan:     I have personally reviewed and noted the following in the patient's chart:    Medical and social history Use of alcohol, tobacco or illicit drugs  Current medications and supplements including opioid prescriptions. Patient is not currently taking opioid prescriptions. Functional ability and status Nutritional status Physical activity Advanced directives List of other physicians Hospitalizations, surgeries, and ER visits in previous 12 months Vitals Screenings to include cognitive, depression, and falls Referrals and appointments  In addition, I have reviewed and discussed with patient certain preventive protocols, quality metrics, and best practice recommendations. A written personalized care plan for preventive services as well as general preventive health recommendations were provided to patient.     Marzella Schlein, LPN   16/11/958   After Visit Summary: (MyChart) Due to this being a telephonic visit, the after visit summary with patients personalized plan was offered to patient via MyChart   Nurse Notes: none

## 2023-08-22 ENCOUNTER — Other Ambulatory Visit: Payer: Self-pay

## 2023-08-22 MED ORDER — AMLODIPINE BESYLATE 2.5 MG PO TABS: 2.5000 mg | ORAL_TABLET | Freq: Every day | ORAL | 3 refills | Status: DC

## 2023-09-27 ENCOUNTER — Ambulatory Visit: Payer: Medicare HMO | Admitting: Family

## 2023-09-27 VITALS — BP 105/65 | HR 60 | Temp 98.0°F | Ht 68.0 in | Wt 208.4 lb

## 2023-09-27 DIAGNOSIS — J309 Allergic rhinitis, unspecified: Secondary | ICD-10-CM

## 2023-09-27 DIAGNOSIS — J069 Acute upper respiratory infection, unspecified: Secondary | ICD-10-CM

## 2023-09-27 LAB — POCT INFLUENZA A/B
Influenza A, POC: NEGATIVE
Influenza B, POC: NEGATIVE

## 2023-09-27 MED ORDER — FLUTICASONE PROPIONATE 50 MCG/ACT NA SUSP: NASAL | 3 refills | Status: AC

## 2023-09-27 NOTE — Progress Notes (Signed)
Patient ID: [name], male    DOB: January 13, 1946, 77 y.o.   MRN: [id number]  Chief Complaint  Patient presents with   Sinus Problem    Pt c/o Cough and Nasal congestion, Present for 4 days. Has tried Coricidin and tylenol. Pt received flu shot on Sunday.    Discussed the use of AI scribe software for clinical note transcription with the patient, who gave verbal consent to proceed.  History of Present Illness   The patient, with a history of hypertension and sinusitis, presents with a few days of illness following a flu shot. He woke up feeling unwell the morning after receiving the flu shot, but has never previously experienced illness following vaccination. He has a scratchy throat and a cough, which he attributes to sinus drainage. He has been using over-the-counter Coricidin  and Flonase nasal spray once daily. He has not had a fever. The cough is dry and is worse in the evening, particularly when he first lays down. He has not been sleeping propped up. He has been using the Flonase once daily, one squirt in each nostril. He had a similar illness in August, for which he took a Z-Pak.     Assessment & Plan:     Upper Respiratory Symptoms - Rapid flu test negative.  Symptoms of cough, scratchy throat, and clear nasal mucus likely secondary to postnasal drip. No fever or productive cough. Symptoms started after recent flu shot, but could be coincidental timing with exposure to a respiratory virus. -Increase Flonase (fluticasone) to 1 spray in each nostril twice a day for a 3 days, then return to once daily, refill sent to pharmacy. -Consider use of a humidifier and sleeping propped up to reduce postnasal drip and associated cough. -Continue Coricidin for high blood pressure as needed and can also add generic Mucinex bid for up to 1 week or sooner if sx subside. -Drink at least 2 liters of water daily.  -Call back Monday if sx are not improved.     Subjective:    Outpatient  Medications Prior to Visit  Medication Sig Dispense Refill   amLODipine (NORVASC) 2.5 MG tablet Take 1 tablet (2.5 mg total) by mouth daily. 90 tablet 3   atenolol (TENORMIN) 25 MG tablet TAKE (1/2) TABLET BY MOUTH ONCE DAILY. 45 tablet 0   atorvastatin (LIPITOR) 10 MG tablet TAKE 1 TABLET EVERY DAY 90 tablet 3   clopidogrel (PLAVIX) 75 MG tablet TAKE 1 TABLET EVERY DAY 90 tablet 3   doxycycline (VIBRAMYCIN) 50 MG capsule Take 50 mg by mouth daily. For rosacea from dermatology     famotidine (PEPCID) 40 MG tablet Take 1 tablet (40 mg total) by mouth daily. 30 tablet 11   fluticasone (CUTIVATE) 0.05 % cream Apply topically 2 (two) times daily as needed.     irbesartan-hydrochlorothiazide (AVALIDE) 150-12.5 MG tablet TAKE 2 TABLETS EVERY DAY 180 tablet 3   mercaptopurine (PURINETHOL) 50 MG tablet Take 1.5 tablets (75 mg total) by mouth daily. Give on an empty stomach 1 hour before or 2 hours after meals. 45 tablet 5   metroNIDAZOLE (METROGEL) 0.75 % gel Apply 1 Application topically 2 (two) times daily. From dermatology for rosacea     nitroGLYCERIN (NITROSTAT) 0.4 MG SL tablet PLACE 1 TAB UNDER TONGUE EVERY 5 MIN IF NEEDED FOR CHEST PAIN. MAY USE 3 TIMES.NO RELIEF CALL 911. 25 tablet 0   ondansetron (ZOFRAN) 4 MG tablet Take 1 tablet (4 mg total) by mouth every 8 (  eight) hours as needed for nausea or vomiting. 20 tablet 0   potassium chloride SA (KLOR-CON) 20 MEQ tablet Take 1 tablet as needed daily as instructed by physician 30 tablet 3   tamsulosin (FLOMAX) 0.4 MG CAPS capsule Take 0.4 mg by mouth daily.      fluticasone (FLONASE) 50 MCG/ACT nasal spray INHALE 1 SPRAY INTO EACH NOSTRIL ONCE DAILY. 16 g 3   No facility-administered medications prior to visit.   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  Constitutional:      General: He is not in acute distress.    Appearance: Normal appearance.  HENT:     Head: Normocephalic.     Right Ear: Tympanic membrane and ear canal normal.     Left Ear: Tympanic membrane and ear canal normal.     Nose:     Right Sinus: No maxillary sinus tenderness or frontal sinus tenderness.     Left Sinus: No maxillary sinus tenderness or frontal sinus tenderness.     Mouth/Throat:     Mouth: Mucous membranes are moist.     Pharynx: Postnasal drip present. No pharyngeal swelling, posterior oropharyngeal erythema or uvula swelling.     Tonsils: No tonsillar exudate or tonsillar abscesses.  Cardiovascular:     Rate and Rhythm: Normal rate and regular rhythm.  Pulmonary:     Effort: Pulmonary effort is normal.     Breath sounds: Normal breath sounds.  Musculoskeletal:        General: Normal range of motion.     Cervical back: Normal range of motion.  Lymphadenopathy:     Head:     Right side of head: No preauricular or posterior auricular adenopathy.     Left side of head: No preauricular or posterior auricular adenopathy.     Cervical: No cervical adenopathy.  Skin:    General: Skin is warm and dry.  Neurological:     Mental Status: He is alert and oriented to person, place, and time.   Psychiatric:        Mood and Affect: Mood normal.    BP 105/65 (BP Location: Left Arm, Patient Position: Sitting, Cuff Size: Large)   Pulse 60   Temp 98 F (36.7 C) (Temporal)   Ht 5\' 8"  (1.727 m)   Wt 208 lb 6.4 oz (94.5 kg)   SpO2 96%   BMI 31.69 kg/m  Wt Readings from Last 3 Encounters:  09/27/23 208 lb  6.4 oz (94.5 kg)  08/21/23 208 lb 9.6 oz (94.6 kg)  07/25/23 207 lb 9.6 oz (94.2 kg)       Dulce Sellar, NP

## 2023-09-27 NOTE — Patient Instructions (Signed)
It was very nice to see you today!   I have sent over a refill of your Flonase nasal spray. Use this TWICE a day for the next 3 days, then use daily. You can continue to take the Coricidin over the counter to help with symptoms. You can also take generic Mucinex (Guaifenesin) twice a day to help with cough and congestion for 1 week. Drink plenty of water!  Call back Monday if your symptoms are no better.        PLEASE NOTE:  If you had any lab tests please let us know if you have not heard back within a few days. You may see your results on MyChart before we have a chance to review them but we will give you a call once they are reviewed by [place]. If we ordered any referrals today, please let us know if you have not heard from their office within the next week.

## 2023-10-06 ENCOUNTER — Other Ambulatory Visit: Payer: Self-pay | Admitting: Family Medicine

## 2023-11-09 ENCOUNTER — Encounter: Payer: Medicare HMO | Admitting: Family Medicine

## 2023-11-21 DIAGNOSIS — X32XXXD Exposure to sunlight, subsequent encounter: Secondary | ICD-10-CM | POA: Diagnosis not present

## 2023-11-21 DIAGNOSIS — L718 Other rosacea: Secondary | ICD-10-CM | POA: Diagnosis not present

## 2023-11-21 DIAGNOSIS — B078 Other viral warts: Secondary | ICD-10-CM | POA: Diagnosis not present

## 2023-11-21 DIAGNOSIS — L918 Other hypertrophic disorders of the skin: Secondary | ICD-10-CM | POA: Diagnosis not present

## 2023-11-21 DIAGNOSIS — L570 Actinic keratosis: Secondary | ICD-10-CM | POA: Diagnosis not present

## 2023-11-30 ENCOUNTER — Other Ambulatory Visit: Payer: Self-pay | Admitting: Family Medicine

## 2023-12-22 ENCOUNTER — Encounter: Payer: Self-pay | Admitting: Family Medicine

## 2023-12-22 ENCOUNTER — Ambulatory Visit (INDEPENDENT_AMBULATORY_CARE_PROVIDER_SITE_OTHER): Payer: Medicare HMO | Admitting: Family Medicine

## 2023-12-22 VITALS — BP 120/70 | HR 70 | Temp 97.4°F | Ht 68.0 in | Wt 212.8 lb

## 2023-12-22 DIAGNOSIS — N183 Chronic kidney disease, stage 3 unspecified: Secondary | ICD-10-CM

## 2023-12-22 DIAGNOSIS — I2510 Atherosclerotic heart disease of native coronary artery without angina pectoris: Secondary | ICD-10-CM | POA: Diagnosis not present

## 2023-12-22 DIAGNOSIS — R739 Hyperglycemia, unspecified: Secondary | ICD-10-CM

## 2023-12-22 DIAGNOSIS — K5150 Left sided colitis without complications: Secondary | ICD-10-CM

## 2023-12-22 DIAGNOSIS — I700 Atherosclerosis of aorta: Secondary | ICD-10-CM

## 2023-12-22 DIAGNOSIS — E785 Hyperlipidemia, unspecified: Secondary | ICD-10-CM | POA: Diagnosis not present

## 2023-12-22 DIAGNOSIS — Z0000 Encounter for general adult medical examination without abnormal findings: Secondary | ICD-10-CM | POA: Diagnosis not present

## 2023-12-22 DIAGNOSIS — Z131 Encounter for screening for diabetes mellitus: Secondary | ICD-10-CM

## 2023-12-22 DIAGNOSIS — Z79899 Other long term (current) drug therapy: Secondary | ICD-10-CM

## 2023-12-22 LAB — CBC WITH DIFFERENTIAL/PLATELET
Basophils Absolute: 0.1 10*3/uL (ref 0.0–0.1)
Basophils Relative: 0.9 % (ref 0.0–3.0)
Eosinophils Absolute: 0.2 10*3/uL (ref 0.0–0.7)
Eosinophils Relative: 2.2 % (ref 0.0–5.0)
HCT: 47.2 % (ref 39.0–52.0)
Hemoglobin: 16.1 g/dL (ref 13.0–17.0)
Lymphocytes Relative: 35.8 % (ref 12.0–46.0)
Lymphs Abs: 2.5 10*3/uL (ref 0.7–4.0)
MCHC: 34 g/dL (ref 30.0–36.0)
MCV: 87.3 fL (ref 78.0–100.0)
Monocytes Absolute: 0.7 10*3/uL (ref 0.1–1.0)
Monocytes Relative: 9.7 % (ref 3.0–12.0)
Neutro Abs: 3.6 10*3/uL (ref 1.4–7.7)
Neutrophils Relative %: 51.4 % (ref 43.0–77.0)
Platelets: 235 10*3/uL (ref 150.0–400.0)
RBC: 5.41 Mil/uL (ref 4.22–5.81)
RDW: 16.3 % — ABNORMAL HIGH (ref 11.5–15.5)
WBC: 7 10*3/uL (ref 4.0–10.5)

## 2023-12-22 LAB — URINALYSIS, ROUTINE W REFLEX MICROSCOPIC
Bilirubin Urine: NEGATIVE
Ketones, ur: NEGATIVE
Leukocytes,Ua: NEGATIVE
Nitrite: NEGATIVE
Specific Gravity, Urine: 1.02 (ref 1.000–1.030)
Total Protein, Urine: 100 — AB
Urine Glucose: NEGATIVE
Urobilinogen, UA: 0.2 (ref 0.0–1.0)
pH: 6 (ref 5.0–8.0)

## 2023-12-22 LAB — LIPID PANEL
Cholesterol: 118 mg/dL (ref 0–200)
HDL: 37.2 mg/dL — ABNORMAL LOW (ref >39.00)
LDL Cholesterol: 50 mg/dL (ref 0–99)
NonHDL: 80.84
Total CHOL/HDL Ratio: 3
Triglycerides: 153 mg/dL — ABNORMAL HIGH (ref 0.0–149.0)
VLDL: 30.6 mg/dL (ref 0.0–40.0)

## 2023-12-22 LAB — COMPREHENSIVE METABOLIC PANEL
ALT: 33 U/L (ref 0–53)
AST: 27 U/L (ref 0–37)
Albumin: 4 g/dL (ref 3.5–5.2)
Alkaline Phosphatase: 60 U/L (ref 39–117)
BUN: 13 mg/dL (ref 6–23)
CO2: 30 meq/L (ref 19–32)
Calcium: 9.8 mg/dL (ref 8.4–10.5)
Chloride: 101 meq/L (ref 96–112)
Creatinine, Ser: 1.36 mg/dL (ref 0.40–1.50)
GFR: 50.51 mL/min — ABNORMAL LOW (ref >60.00)
Glucose, Bld: 98 mg/dL (ref 70–99)
Potassium: 3.8 meq/L (ref 3.5–5.1)
Sodium: 143 meq/L (ref 135–145)
Total Bilirubin: 0.6 mg/dL (ref 0.2–1.2)
Total Protein: 7.1 g/dL (ref 6.0–8.3)

## 2023-12-22 LAB — HEMOGLOBIN A1C: Hgb A1c MFr Bld: 6 % (ref 4.6–6.5)

## 2023-12-22 LAB — VITAMIN B12: Vitamin B-12: 194 pg/mL — ABNORMAL LOW (ref 211–911)

## 2023-12-22 MED ORDER — ACYCLOVIR 5 % EX OINT: TOPICAL_OINTMENT | CUTANEOUS | 2 refills | Status: AC

## 2023-12-22 NOTE — Patient Instructions (Addendum)
 Please stop by lab before you go If you have mychart- we will send your results within 3 business days of us  receiving them.  If you do not have mychart- we will call you about results within 5 business days of us  receiving them.  *please also note that you will see labs on mychart as soon as they post. I will later go in and write notes on them- will say notes from Dr. Katrinka   No changes today unless labs lead us  to make changes  Recommended follow up: Return in about 6 months (around 06/20/2024) for followup or sooner if needed.Schedule b4 you leave.

## 2023-12-22 NOTE — Progress Notes (Signed)
 Phone: [phone]   Subjective:  Patient presents today for their annual physical. Chief complaint-noted.   See problem oriented charting- ROS- full  review of systems was completed and negative  Per full ROS sheet completed by patient  The following were reviewed and entered/updated in epic: Past Medical History:  Diagnosis Date   Adenomatous polyps 03/2004, 11/2012   Arthritis    Bradycardia 2018   Clotting disorder (HCC)    on plavix    Coronary atherosclerosis of unspecified type of vessel, native or graft    Esophagitis 1993   GERD (gastroesophageal reflux disease)    Hyperlipidemia    Hypertension    Myocardial infarction (HCC) 1993   Shingles 2019   Sleep apnea    cpap   Stroke (HCC) 02/2014   Trigger finger (acquired)    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   UTI (urinary tract infection) 2019   Patient Active Problem List   Diagnosis Date Noted   ULCERATIVE COLITIS-LEFT SIDE 07/03/2008    Priority: High   CAD (coronary artery disease) 06/15/2007    Priority: High   Aortic atherosclerosis (HCC) 04/20/2021    Priority: Medium    Hyperglycemia 06/08/2015    Priority: Medium    CKD (chronic kidney disease), stage III (HCC) 10/24/2014    Priority: Medium    Sleep apnea 07/02/2008    Priority: Medium    Hyperlipidemia 06/14/2007    Priority: Medium    Essential hypertension 06/14/2007    Priority: Medium    History of colonic polyps 06/14/2007    Priority: Medium    Postherpetic neuralgia 03/19/2018    Priority: Low   Balanitis 03/06/2017    Priority: Low   OA (osteoarthritis) of knee 12/07/2015    Priority: Low   Former smoker 06/08/2015    Priority: Low   Edema 02/04/2015    Priority: Low   Allergic rhinitis 10/24/2014    Priority: Low   Obesity (BMI 30-39.9) 04/25/2014    Priority: Low   Right cervical radiculopathy 04/12/2013    Priority: Low   Low back pain 01/11/2008    Priority: Low   GERD 11/07/2007    Priority: Low    Trigger finger, acquired 10/23/2007    Priority: Low   Actinic keratosis 10/10/2018   Past Surgical History:  Procedure Laterality Date   ANGIOPLASTY  11/15/1991   cataract left     feb 2024- will need the right eventually   CHOLECYSTECTOMY  11/14/2000   LUMBAR FUSION  11/14/1981   ROTATOR CUFF REPAIR  11/15/2007   right   TRIGGER FINGER RELEASE     right    Family History  Problem Relation Age of Onset   Stroke Mother    COPD Father    Heart disease Sister        valve replacement   CAD Brother    Heart attack Brother    Colon cancer Neg Hx    Esophageal cancer Neg Hx    Liver disease Neg Hx    Diabetes Neg Hx    Rectal cancer Neg Hx    Stomach cancer Neg Hx     Medications- reviewed and updated Current Outpatient Medications  Medication Sig Dispense Refill   amLODipine  (NORVASC ) 2.5 MG tablet Take 1 tablet (2.5 mg total) by mouth daily. 90 tablet 3   atorvastatin  (LIPITOR) 10 MG tablet TAKE 1 TABLET EVERY DAY 90 tablet 3   clopidogrel  (PLAVIX ) 75 MG tablet TAKE 1 TABLET EVERY DAY 90 tablet 3  doxycycline  (VIBRAMYCIN ) 50 MG capsule Take 50 mg by mouth daily. For rosacea from dermatology     famotidine  (PEPCID ) 40 MG tablet Take 1 tablet (40 mg total) by mouth daily. 30 tablet 11   fluticasone  (CUTIVATE ) 0.05 % cream Apply topically 2 (two) times daily as needed.     fluticasone  (FLONASE ) 50 MCG/ACT nasal spray INHALE 1 SPRAY INTO EACH NOSTRIL ONCE DAILY. 16 g 3   irbesartan -hydrochlorothiazide  (AVALIDE) 150-12.5 MG tablet TAKE 2 TABLETS EVERY DAY 180 tablet 3   mercaptopurine  (PURINETHOL ) 50 MG tablet Take 1.5 tablets (75 mg total) by mouth daily. Give on an empty stomach 1 hour before or 2 hours after meals. 45 tablet 5   metroNIDAZOLE (METROGEL) 0.75 % gel Apply 1 Application topically 2 (two) times daily. From dermatology for rosacea     nitroGLYCERIN  (NITROSTAT ) 0.4 MG SL tablet PLACE 1 TAB UNDER TONGUE EVERY 5 MIN IF NEEDED FOR CHEST PAIN. MAY USE 3 TIMES.NO  RELIEF CALL 911. 25 tablet 0   ondansetron  (ZOFRAN ) 4 MG tablet Take 1 tablet (4 mg total) by mouth every 8 (eight) hours as needed for nausea or vomiting. 20 tablet 0   potassium chloride  SA (KLOR-CON ) 20 MEQ tablet Take 1 tablet as needed daily as instructed by physician 30 tablet 3   tamsulosin (FLOMAX) 0.4 MG CAPS capsule Take 0.4 mg by mouth daily.      acyclovir  ointment (ZOVIRAX ) 5 % Apply topically every 3 (three) hours. For fever blisters up to 7 days 5 g 2   No current facility-administered medications for this visit.    Allergies-reviewed and updated Allergies  Allergen Reactions   Penicillins Rash    REACTION: rash   Sulfasalazine  Rash    Social History   Social History Narrative   Widower January 2019.. 3 children- son is a patient here. 4 grandkids. 1 greatgrandchildi n [place] in 2024.       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 Oropharynx normal Neck: no thyromegaly CV: RRR 3/6 sem heard best rusb- history of calcifications on aortic valve on echocardiogram 2016 Lungs: CTAB no crackles, wheeze, rhonchi Abdomen: soft/nontender/nondistended/normal bowel sounds. No rebound or guarding.  Ext: no edema Skin: warm, dry Neuro: grossly normal, moves all extremities, PERRLA Declines genitourinary or rectal exam   Assessment and Plan  78 y.o. male presenting for annual physical.  Health Maintenance counseling: 1. Anticipatory guidance: Patient counseled regarding regular dental exams -q6 months, eye exams -yearly-cataract surgery on left last year ,   avoiding smoking and second hand smoke , limiting alcohol to 2 beverages per day - doesn't drink, no illicit drugs .   2. Risk factor reduction:  Advised patient of need for regular exercise and diet rich and fruits and vegetables to reduce risk of heart attack and stroke.  Exercise-no exercise at last physical but strongly encouraged especially with his Silver sneakers- had signed up at [employer] but didn't go much-  encouraged exercise again.  Diet/weight management-weight up 3 pounds from last physical- discussed healthy eating and regular exercise to bring this down. Feels could cut back on breads for instance- reasonable goal Wt Readings from Last 3 Encounters:  12/22/23 212 lb 12.8 oz (96.5 kg)  09/27/23 208 lb 6.4 oz (94.5 kg)  08/21/23 208 lb 9.6 oz (94.6 kg)  3. Immunizations/screenings/ancillary studies-still due for Tdap at pharmacy, declines COVID  Immunization History  Administered Date(s) Administered   Fluad Quad(high Dose 65+) 08/31/2020, 09/13/2021, 08/31/2022, 09/24/2023   Influenza Split 08/19/2011, 08/17/2012   Influenza Whole 10/23/2007, 08/14/2009, 08/13/2010, 08/23/2019   Influenza, High Dose Seasonal PF 09/13/2016, 09/01/2017, 08/22/2018, 08/21/2019   Influenza,inj,Quad PF,6+ Mos 08/16/2013   Influenza-Unspecified 08/28/2014, 09/07/2015, 09/01/2017, 09/13/2018   Moderna Sars-Covid-2 Vaccination 12/27/2019, 01/24/2020, 10/04/2020, 06/25/2021   Pneumococcal Conjugate-13 06/08/2015   Pneumococcal Polysaccharide-23 04/25/2014   Td 11/14/2000, 09/21/2010   Tdap 11/11/2022   Zoster Recombinant(Shingrix) 09/24/2018, 11/11/2022  4. Prostate cancer screening- on Flomax.  Still sees Dr. Delwin decisions are with them. Seen in august Lab Results  Component Value Date   PSA 1.60 10/25/2021   PSA 1.58 04/16/2020   PSA 3.17 10/26/2019   5. Colon cancer screening - April 28, 2021 with 3-year repeat planned-will be due later this year mainly for ulcerative colitis 6.  Skin cancer screening-continues to see Dr. Shona. advised regular sunscreen use. Denies worrisome, changing, or new skin lesions.  7. Smoking associated screening (lung cancer screening, AAA screen 65-75, UA)-former smoker-quit in 1972-AAA screen declined in the past.  Will check UA 8. STD screening - not dating after loss of his wife several years ago-not sexually active  Status of chronic or acute concerns   # Social update-new great grandson in [place]  at last visit and was considering visiting- they moved back to [facility]. Surgery at young age but doing ok- completely recovered.   #CAD- followed with Dr. Okey #hyperlipidemia/ aortic atherosclerosis  S: Medication: Atorvastatin  10 mg daily, Plavix  75 mg - no chest pain or shortness of breath  Lab Results  Component Value Date   CHOL 110 11/04/2022   HDL 31.80 (L) 11/04/2022   LDLCALC 57 11/04/2022   TRIG 108.0 11/04/2022   CHOLHDL 3 11/04/2022   A/P: For CAD-asymptomatic continue current medications  For hyperlipidemia-good control last year- continue current medications   Aortic atherosclerosis (presumed stable)- LDL goal ideally <70 - suspect at goal continue current medications    #slight murmur- could be from aortic calcifications- consider echocardiogram if worsens   #hypertension S: medication: Irbesartan -hctz 150-12.5 mg in am and pm , then amlodipine  2.5 mg around dinner time -atenolol  in past- blood pressure controlled on amlodipine  though and stopped Home readings #s:  looks good at home too even without atenolol  BP Readings from Last 3 Encounters:  12/22/23 120/70  09/27/23 105/65  08/21/23 128/78  A/P: well controlled continue current medications   #Chronic kidney disease stage III S: GFR is typically in the high 50s range -Patient knows to avoid NSAIDs  A/P: continue to monitor with labs   # Hyperglycemia/insulin resistance/prediabetes- peak a1c 6.14 April 2021 S:  Medication: None Lab Results  Component Value Date    HGBA1C 5.8 11/04/2022   HGBA1C 5.9 04/18/2022   HGBA1C 5.9 10/25/2021   A/P: hopefully stable- update a1c today. Continue without meds for now   # ulcerative colitis- continues to follow up with gastrointestinal . Remained on mercaptopurine  75 mg daily - last colonoscopy 04/28/21 with 3-year repeat planned as  above   #GERD- Pepcid  failure- on pantoprazole  40 mg  . Check B12 with long term PPI   # Rosacea-on doxycycline  through dermatology as well as metronidazole gel through Dr. Shona  #fever blisters- acyclovir  ointment helpful- he also sometimes gets a spot on buttocks that heals with it- needs printed as uses coupon   Recommended follow up: Return in about 6 months (around 06/20/2024) for followup or sooner if needed.Schedule b4 you leave. Future Appointments  Date Time Provider Department Center  09/03/2024 10:40 AM [facility] ANNUAL WELLNESS VISIT 1 [facility]   Lab/Order associations: fasting   ICD-10-CM   1. Preventative health care  Z00.00     2. Screening for diabetes mellitus  Z13.1     3. Hyperglycemia  R73.9     4. Coronary artery disease involving native coronary artery of native heart without angina pectoris  I25.10     5. Hyperlipidemia, unspecified hyperlipidemia type  E78.5     6. Aortic atherosclerosis (HCC)  I70.0     7. Left sided ulcerative colitis without complication (HCC) Chronic K51.50     8. Stage 3 chronic kidney disease, unspecified whether stage 3a or 3b CKD (HCC) Chronic N18.30     9. High risk medication use  Z79.899       Meds ordered this encounter  Medications   acyclovir  ointment (ZOVIRAX ) 5 %    Sig: Apply topically every 3 (three) hours. For fever blisters up to 7 days    Dispense:  5 g    Refill:  2    Return precautions advised.  Garnette Lukes, MD

## 2023-12-28 ENCOUNTER — Other Ambulatory Visit: Payer: Self-pay

## 2023-12-28 DIAGNOSIS — R3129 Other microscopic hematuria: Secondary | ICD-10-CM

## 2023-12-29 ENCOUNTER — Other Ambulatory Visit: Payer: Medicare HMO

## 2023-12-29 DIAGNOSIS — R3129 Other microscopic hematuria: Secondary | ICD-10-CM | POA: Diagnosis not present

## 2023-12-30 LAB — URINE CULTURE
MICRO NUMBER:: 16086298
Result:: NO GROWTH
SPECIMEN QUALITY:: ADEQUATE

## 2024-01-01 ENCOUNTER — Encounter: Payer: Self-pay | Admitting: Family Medicine

## 2024-01-01 ENCOUNTER — Other Ambulatory Visit: Payer: Self-pay

## 2024-01-01 DIAGNOSIS — R3129 Other microscopic hematuria: Secondary | ICD-10-CM

## 2024-01-11 DIAGNOSIS — M65331 Trigger finger, right middle finger: Secondary | ICD-10-CM | POA: Diagnosis not present

## 2024-01-11 DIAGNOSIS — M65311 Trigger thumb, right thumb: Secondary | ICD-10-CM | POA: Diagnosis not present

## 2024-02-01 DIAGNOSIS — R3121 Asymptomatic microscopic hematuria: Secondary | ICD-10-CM | POA: Diagnosis not present

## 2024-02-01 DIAGNOSIS — R8289 Other abnormal findings on cytological and histological examination of urine: Secondary | ICD-10-CM | POA: Diagnosis not present

## 2024-02-07 DIAGNOSIS — H2511 Age-related nuclear cataract, right eye: Secondary | ICD-10-CM | POA: Diagnosis not present

## 2024-02-07 DIAGNOSIS — H4322 Crystalline deposits in vitreous body, left eye: Secondary | ICD-10-CM | POA: Diagnosis not present

## 2024-02-07 DIAGNOSIS — H2181 Floppy iris syndrome: Secondary | ICD-10-CM | POA: Diagnosis not present

## 2024-02-07 DIAGNOSIS — H35033 Hypertensive retinopathy, bilateral: Secondary | ICD-10-CM | POA: Diagnosis not present

## 2024-02-16 DIAGNOSIS — M65311 Trigger thumb, right thumb: Secondary | ICD-10-CM | POA: Diagnosis not present

## 2024-02-16 DIAGNOSIS — M65331 Trigger finger, right middle finger: Secondary | ICD-10-CM | POA: Diagnosis not present

## 2024-02-21 ENCOUNTER — Telehealth: Payer: Self-pay | Admitting: Pharmacy Technician

## 2024-02-21 ENCOUNTER — Other Ambulatory Visit (HOSPITAL_COMMUNITY): Payer: Self-pay

## 2024-02-21 NOTE — Telephone Encounter (Signed)
 Pharmacy Patient Advocate Encounter   Received notification from CoverMyMeds that prior authorization for Acyclovir 5% ointment is required/requested.   Insurance verification completed.   The patient is insured through [employer] .   Per test claim: PA required; PA submitted to above mentioned insurance via CoverMyMeds Key/confirmation #/EOC [id number] Status is pending

## 2024-02-22 ENCOUNTER — Other Ambulatory Visit (HOSPITAL_COMMUNITY): Payer: Self-pay

## 2024-02-22 NOTE — Telephone Encounter (Signed)
 Pharmacy Patient Advocate Encounter  Received notification from [facility] that Prior Authorization for Acyclovir 5% ointment has been DENIED.  Full denial letter will be uploaded to the media tab. See denial reason below.   PA #/Case ID/Reference #: [id number]  Patient may purchase outside of insurance.

## 2024-04-03 DIAGNOSIS — C44329 Squamous cell carcinoma of skin of other parts of face: Secondary | ICD-10-CM | POA: Diagnosis not present

## 2024-04-18 DIAGNOSIS — M65311 Trigger thumb, right thumb: Secondary | ICD-10-CM | POA: Diagnosis not present

## 2024-04-18 DIAGNOSIS — M65331 Trigger finger, right middle finger: Secondary | ICD-10-CM | POA: Diagnosis not present

## 2024-05-15 DIAGNOSIS — Z08 Encounter for follow-up examination after completed treatment for malignant neoplasm: Secondary | ICD-10-CM | POA: Diagnosis not present

## 2024-05-15 DIAGNOSIS — Z85828 Personal history of other malignant neoplasm of skin: Secondary | ICD-10-CM | POA: Diagnosis not present

## 2024-05-15 DIAGNOSIS — X32XXXD Exposure to sunlight, subsequent encounter: Secondary | ICD-10-CM | POA: Diagnosis not present

## 2024-05-15 DIAGNOSIS — B078 Other viral warts: Secondary | ICD-10-CM | POA: Diagnosis not present

## 2024-05-15 DIAGNOSIS — L570 Actinic keratosis: Secondary | ICD-10-CM | POA: Diagnosis not present

## 2024-06-26 ENCOUNTER — Encounter: Payer: Self-pay | Admitting: Family Medicine

## 2024-06-26 ENCOUNTER — Ambulatory Visit: Payer: Medicare HMO | Admitting: Family Medicine

## 2024-06-26 VITALS — BP 110/68 | HR 64 | Temp 97.3°F | Ht 68.0 in | Wt 212.6 lb

## 2024-06-26 DIAGNOSIS — R739 Hyperglycemia, unspecified: Secondary | ICD-10-CM | POA: Diagnosis not present

## 2024-06-26 DIAGNOSIS — R600 Localized edema: Secondary | ICD-10-CM

## 2024-06-26 DIAGNOSIS — E538 Deficiency of other specified B group vitamins: Secondary | ICD-10-CM

## 2024-06-26 DIAGNOSIS — Z131 Encounter for screening for diabetes mellitus: Secondary | ICD-10-CM

## 2024-06-26 DIAGNOSIS — I10 Essential (primary) hypertension: Secondary | ICD-10-CM

## 2024-06-26 DIAGNOSIS — E785 Hyperlipidemia, unspecified: Secondary | ICD-10-CM | POA: Diagnosis not present

## 2024-06-26 DIAGNOSIS — R011 Cardiac murmur, unspecified: Secondary | ICD-10-CM

## 2024-06-26 DIAGNOSIS — I2510 Atherosclerotic heart disease of native coronary artery without angina pectoris: Secondary | ICD-10-CM

## 2024-06-26 LAB — URINALYSIS, ROUTINE W REFLEX MICROSCOPIC
Bilirubin Urine: NEGATIVE
Ketones, ur: NEGATIVE
Leukocytes,Ua: NEGATIVE
Nitrite: NEGATIVE
Specific Gravity, Urine: 1.01 (ref 1.000–1.030)
Total Protein, Urine: 30 — AB
Urine Glucose: NEGATIVE
Urobilinogen, UA: 0.2 (ref 0.0–1.0)
pH: 7 (ref 5.0–8.0)

## 2024-06-26 LAB — CBC WITH DIFFERENTIAL/PLATELET
Basophils Absolute: 0.1 K/uL (ref 0.0–0.1)
Basophils Relative: 0.9 % (ref 0.0–3.0)
Eosinophils Absolute: 0.1 K/uL (ref 0.0–0.7)
Eosinophils Relative: 1.7 % (ref 0.0–5.0)
HCT: 44.4 % (ref 39.0–52.0)
Hemoglobin: 14.9 g/dL (ref 13.0–17.0)
Lymphocytes Relative: 29.9 % (ref 12.0–46.0)
Lymphs Abs: 1.9 K/uL (ref 0.7–4.0)
MCHC: 33.5 g/dL (ref 30.0–36.0)
MCV: 86.9 fl (ref 78.0–100.0)
Monocytes Absolute: 0.7 K/uL (ref 0.1–1.0)
Monocytes Relative: 10.6 % (ref 3.0–12.0)
Neutro Abs: 3.6 K/uL (ref 1.4–7.7)
Neutrophils Relative %: 56.9 % (ref 43.0–77.0)
Platelets: 229 K/uL (ref 150.0–400.0)
RBC: 5.11 Mil/uL (ref 4.22–5.81)
RDW: 16.1 % — ABNORMAL HIGH (ref 11.5–15.5)
WBC: 6.3 K/uL (ref 4.0–10.5)

## 2024-06-26 LAB — COMPREHENSIVE METABOLIC PANEL WITH GFR
ALT: 28 U/L (ref 0–53)
AST: 24 U/L (ref 0–37)
Albumin: 3.8 g/dL (ref 3.5–5.2)
Alkaline Phosphatase: 53 U/L (ref 39–117)
BUN: 10 mg/dL (ref 6–23)
CO2: 31 meq/L (ref 19–32)
Calcium: 9.4 mg/dL (ref 8.4–10.5)
Chloride: 99 meq/L (ref 96–112)
Creatinine, Ser: 1.17 mg/dL (ref 0.40–1.50)
GFR: 60.29 mL/min (ref >60.00)
Glucose, Bld: 104 mg/dL — ABNORMAL HIGH (ref 70–99)
Potassium: 3.2 meq/L — ABNORMAL LOW (ref 3.5–5.1)
Sodium: 137 meq/L (ref 135–145)
Total Bilirubin: 0.7 mg/dL (ref 0.2–1.2)
Total Protein: 6.7 g/dL (ref 6.0–8.3)

## 2024-06-26 LAB — HEMOGLOBIN A1C: Hgb A1c MFr Bld: 6.3 % (ref 4.6–6.5)

## 2024-06-26 LAB — VITAMIN B12: Vitamin B-12: 435 pg/mL (ref 211–911)

## 2024-06-26 LAB — TSH: TSH: 0.89 u[IU]/mL (ref 0.35–5.50)

## 2024-06-26 NOTE — Patient Instructions (Addendum)
 [facility] contact- call to set up with new doctor with Dr. Aneita retired Please call to schedule visit and/or procedure IF you do not hear within a week Address: [address] [place], [place], [place] [address] Phone: [phone]   Call Dr. Okey for follow up   We have placed a referral for you today to echocardiogram- please call their # if you do not hear within a week (may be listed below or you may see mychart message within a few days with #).   Cut salt back down- could have caused swelling- if not doing better we can consider stopping amlodipine  plus we are going to look into causes with bloodwork, urine, echocardiogram  Please stop by lab before you go If you have mychart- we will send your results within 3 business days of us  receiving them.  If you do not have mychart- we will call you about results within 5 business days of us  receiving them.  *please also note that you will see labs on mychart as soon as they post. I will later go in and write notes on them- will say notes from Dr. Katrinka   Recommended follow up: Return in about 6 months (around 12/27/2024) for physical or sooner if needed.Schedule b4 you leave.

## 2024-06-26 NOTE — Progress Notes (Signed)
 Phone [phone] In person visit   Subjective:   [name] is a 78 y.o. year old very pleasant male patient who presents for/with See problem oriented charting Chief Complaint  Patient presents with   Hypertension   Leg Swelling    Bilateral, x2 weeks    Past Medical History-  Patient Active Problem List   Diagnosis Date Noted   ULCERATIVE COLITIS-LEFT SIDE 07/03/2008    Priority: High   CAD (coronary artery disease) 06/15/2007    Priority: High   Aortic atherosclerosis (HCC) 04/20/2021    Priority: Medium    Hyperglycemia 06/08/2015    Priority: Medium    CKD (chronic kidney disease), stage III (HCC) 10/24/2014    Priority: Medium    Sleep apnea 07/02/2008    Priority: Medium    Hyperlipidemia 06/14/2007    Priority: Medium    Essential hypertension 06/14/2007    Priority: Medium    History of colonic polyps 06/14/2007    Priority: Medium    Postherpetic neuralgia 03/19/2018    Priority: Low   Balanitis 03/06/2017    Priority: Low   OA (osteoarthritis) of knee 12/07/2015    Priority: Low   Former smoker 06/08/2015    Priority: Low   Edema 02/04/2015    Priority: Low   Allergic rhinitis 10/24/2014    Priority: Low   Obesity (BMI 30-39.9) 04/25/2014    Priority: Low   Right cervical radiculopathy 04/12/2013    Priority: Low   Low back pain 01/11/2008    Priority: Low   GERD 11/07/2007    Priority: Low   Trigger finger, acquired 10/23/2007    Priority: Low   Actinic keratosis 10/10/2018    Medications- reviewed and updated Current Outpatient Medications  Medication Sig Dispense Refill   acyclovir  ointment (ZOVIRAX ) 5 % Apply topically every 3 (three) hours. For fever blisters up to 7 days 5 g 2   amLODipine  (NORVASC ) 2.5 MG tablet Take 1 tablet (2.5 mg total) by mouth daily. 90 tablet 3   atorvastatin  (LIPITOR) 10 MG tablet TAKE 1 TABLET EVERY DAY 90 tablet 3   clopidogrel  (PLAVIX ) 75 MG tablet TAKE 1 TABLET EVERY DAY 90 tablet 3    doxycycline  (VIBRAMYCIN ) 50 MG capsule Take 50 mg by mouth daily. For rosacea from dermatology     famotidine  (PEPCID ) 40 MG tablet Take 1 tablet (40 mg total) by mouth daily. 30 tablet 11   fluticasone  (CUTIVATE ) 0.05 % cream Apply topically 2 (two) times daily as needed.     fluticasone  (FLONASE ) 50 MCG/ACT nasal spray INHALE 1 SPRAY INTO EACH NOSTRIL ONCE DAILY. 16 g 3   irbesartan -hydrochlorothiazide  (AVALIDE) 150-12.5 MG tablet TAKE 2 TABLETS EVERY DAY 180 tablet 3   mercaptopurine  (PURINETHOL ) 50 MG tablet Take 1.5 tablets (75 mg total) by mouth daily. Give on an empty stomach 1 hour before or 2 hours after meals. 45 tablet 5   metroNIDAZOLE (METROGEL) 0.75 % gel Apply 1 Application topically 2 (two) times daily. From dermatology for rosacea     nitroGLYCERIN  (NITROSTAT ) 0.4 MG SL tablet PLACE 1 TAB UNDER TONGUE EVERY 5 MIN IF NEEDED FOR CHEST PAIN. MAY USE 3 TIMES.NO RELIEF CALL 911. 25 tablet 0   ondansetron  (ZOFRAN ) 4 MG tablet Take 1 tablet (4 mg total) by mouth every 8 (eight) hours as needed for nausea or vomiting. 20 tablet 0   potassium chloride  SA (KLOR-CON ) 20 MEQ tablet Take 1 tablet as needed daily as instructed by physician 30 tablet 3  tamsulosin (FLOMAX) 0.4 MG CAPS capsule Take 0.4 mg by mouth daily.      No current facility-administered medications for this visit.     Objective:  BP 110/68 (BP Location: Left Arm, Patient Position: Sitting, Cuff Size: Normal)   Pulse 64   Temp (!) 97.3 F (36.3 C) (Temporal)   Ht 5' 8 (1.727 m)   Wt 212 lb 9.6 oz (96.4 kg)   SpO2 96%   BMI 32.33 kg/m  Gen: NAD, resting comfortably CV: RRR stable murmur RUSB Lungs: CTAB no crackles, wheeze, rhonchi Abdomen: soft/nontender/nondistended/normal bowel sounds. No rebound or guarding.  Ext: trace edema to 1_ Skin: warm, dry     Assessment and Plan   # Trigger thumb-has worked with Dr. Camella 's of [facility]-has required multiple injections-has consider surgery if does not  resolve but doing better   #Right shoulder pain- thinks he strained this but improving- wants to monitor  #CAD- followed with Dr. Okey #hyperlipidemia/ aortic atherosclerosis  S: Medication: Atorvastatin  10 mg daily, Plavix  75 mg. No chest pain or shortness of breath  Lab Results  Component Value Date   CHOL 118 12/22/2023   HDL 37.20 (L) 12/22/2023   LDLCALC 50 12/22/2023   TRIG 153.0 (H) 12/22/2023   CHOLHDL 3 12/22/2023   A/P: For CAD-asymptomatic continue current medications  For hyperlipidemia-at goal- continue current medications    #slight murmur rusb - could be from aortic calcifications- check echocardiogram with last check 2016  #Edema- noted swelling around 10 days ago in both ankles right worse than left. Better this morning. Check for protein but has had some and urology monitoring- make sure doesn't worsen. Thinks may have overdone salt though  #hypertension S: medication: Irbesartan -hctz 150-12.5 mg in am and pm , then amlodipine  2.5 mg around dinner time BP Readings from Last 3 Encounters:  06/26/24 110/68  12/22/23 120/70  09/27/23 105/65   A/P: well controlled continue current medications- discussed option of stopping amlodipine  for swelling - he wants to reduce salt intake first  #Chronic kidney disease stage III S: GFR is typically in the high 50s range -Patient knows to avoid NSAIDs  A/P: hopefully stable- update cmp today. Continue without meds for now    # Hyperglycemia/insulin resistance/prediabetes- peak a1c 6.14 April 2021 S:  Medication: None Exercise and diet- not exercising but some work around the home. Weight stable at least but would prefer weight loss Lab Results  Component Value Date   HGBA1C 6.0 12/22/2023   HGBA1C 5.8 11/04/2022   HGBA1C 5.9 04/18/2022   A/P: hopefully stable- update a1c today. Continue without meds for now   # ulcerative colitis- continued to follow up with Dr. Aneita. Remained on mercaptopurine  still - last colonoscopy  04/28/21- he is going to call to schedule with new doctor  # B12 deficiency: As low as 194 in 2025 S: Current treatment/medication (oral vs. IM): 1000 mcg recommended     - he took it a while    - took until a month ago A/P: hopefully stable- update b12 today. Continue without meds for now  - may need to restart  #urology advised annual follow up with hematuria  - has had extensive prior workup for this- checking UA today to make sure not worsened  Recommended follow up: Return in about 6 months (around 12/27/2024) for physical or sooner if needed.Schedule b4 you leave. Future Appointments  Date Time Provider Department Center  09/03/2024 10:40 AM [facility] ANNUAL WELLNESS VISIT 1 [facility]  Lab/Order associations:   ICD-10-CM   1. Coronary artery disease involving native coronary artery of native heart without angina pectoris  I25.10     2. Hyperlipidemia, unspecified hyperlipidemia type  E78.5 Comprehensive metabolic panel with GFR    CBC with Differential/Platelet    3. Essential hypertension  I10 Comprehensive metabolic panel with GFR    CBC with Differential/Platelet    4. Hyperglycemia  R73.9 Hemoglobin A1c    5. Screening for diabetes mellitus  Z13.1 Hemoglobin A1c    6. Localized edema  R60.0 Comprehensive metabolic panel with GFR    CBC with Differential/Platelet    TSH    Urinalysis, Routine w reflex microscopic    ECHOCARDIOGRAM COMPLETE    7. Murmur  R01.1 ECHOCARDIOGRAM COMPLETE    8. B12 deficiency  E53.8 Vitamin B12      No orders of the defined types were placed in this encounter.   Return precautions advised.  Garnette Lukes, MD

## 2024-06-27 ENCOUNTER — Ambulatory Visit: Payer: Self-pay | Admitting: Family Medicine

## 2024-07-01 MED ORDER — POTASSIUM CHLORIDE CRYS ER 20 MEQ PO TBCR
EXTENDED_RELEASE_TABLET | ORAL | 3 refills | Status: DC
Start: 2024-07-01 — End: 2024-08-08

## 2024-07-01 NOTE — Addendum Note (Signed)
 Addended by: MARDY LEOTIS RAMAN on: 07/01/2024 02:21 PM   Modules accepted: Orders

## 2024-07-08 ENCOUNTER — Telehealth: Payer: Self-pay | Admitting: Family Medicine

## 2024-07-08 NOTE — Telephone Encounter (Signed)
 Is this something you could help with?    Copied from CRM [id number]. Topic: General - Other >> Jul 05, 2024 12:53 PM Rosina BIRCH wrote: Reason for CRM: patient called stating he has not heard back from an echocardiogram referral CB [phone]

## 2024-07-22 ENCOUNTER — Other Ambulatory Visit (HOSPITAL_COMMUNITY): Payer: Self-pay

## 2024-07-23 ENCOUNTER — Ambulatory Visit (HOSPITAL_COMMUNITY)
Admission: RE | Admit: 2024-07-23 | Discharge: 2024-07-23 | Disposition: A | Discharge status: Home / Self Care | Source: Ambulatory Visit | Attending: Family Medicine | Admitting: Family Medicine

## 2024-07-23 DIAGNOSIS — R011 Cardiac murmur, unspecified: Secondary | ICD-10-CM | POA: Diagnosis not present

## 2024-07-23 DIAGNOSIS — R600 Localized edema: Secondary | ICD-10-CM | POA: Diagnosis not present

## 2024-07-23 LAB — ECHOCARDIOGRAM COMPLETE
Area-P 1/2: 2.22 cm2
S' Lateral: 2.2 cm

## 2024-07-23 NOTE — Progress Notes (Signed)
*  PRELIMINARY RESULTS* Echocardiogram 2D Echocardiogram has been performed.  [name] 07/23/2024, 9:18 AM

## 2024-08-01 ENCOUNTER — Telehealth: Payer: Self-pay

## 2024-08-01 NOTE — Telephone Encounter (Addendum)
 Received a call from past [facility] patient requesting refill of Purinethol  (50 mg) to [facility] [place], patient scheduled for 10/28. Please review and advise  Thank you

## 2024-08-05 ENCOUNTER — Other Ambulatory Visit: Payer: Self-pay | Admitting: Family Medicine

## 2024-08-05 ENCOUNTER — Telehealth: Payer: Self-pay | Admitting: Physician Assistant

## 2024-08-05 MED ORDER — MERCAPTOPURINE 50 MG PO TABS: 75.0000 mg | ORAL_TABLET | Freq: Every day | ORAL | 1 refills | Status: DC

## 2024-08-05 NOTE — Telephone Encounter (Signed)
 See routing history. Message was created on 08/01/24 and routed today (08/05/24).   Prescription sent to patient's pharmacy until scheduled appt on 09/10/24 with Alan Coombs, PA.

## 2024-08-05 NOTE — Addendum Note (Signed)
 Addended by: MADAN, Rashied Corallo L on: 08/05/2024 04:33 PM   Modules accepted: Orders

## 2024-08-07 ENCOUNTER — Ambulatory Visit (INDEPENDENT_AMBULATORY_CARE_PROVIDER_SITE_OTHER): Admitting: Family

## 2024-08-07 ENCOUNTER — Encounter: Payer: Self-pay | Admitting: Family

## 2024-08-07 ENCOUNTER — Ambulatory Visit: Payer: Self-pay

## 2024-08-07 VITALS — BP 124/76 | HR 69 | Temp 97.5°F | Ht 68.0 in | Wt 215.0 lb

## 2024-08-07 DIAGNOSIS — M25474 Effusion, right foot: Secondary | ICD-10-CM

## 2024-08-07 DIAGNOSIS — M25472 Effusion, left ankle: Secondary | ICD-10-CM | POA: Diagnosis not present

## 2024-08-07 DIAGNOSIS — J0110 Acute frontal sinusitis, unspecified: Secondary | ICD-10-CM | POA: Diagnosis not present

## 2024-08-07 DIAGNOSIS — E876 Hypokalemia: Secondary | ICD-10-CM | POA: Diagnosis not present

## 2024-08-07 DIAGNOSIS — M25471 Effusion, right ankle: Secondary | ICD-10-CM

## 2024-08-07 DIAGNOSIS — M79601 Pain in right arm: Secondary | ICD-10-CM | POA: Diagnosis not present

## 2024-08-07 DIAGNOSIS — R059 Cough, unspecified: Secondary | ICD-10-CM

## 2024-08-07 DIAGNOSIS — Z9889 Other specified postprocedural states: Secondary | ICD-10-CM | POA: Diagnosis not present

## 2024-08-07 DIAGNOSIS — M25511 Pain in right shoulder: Secondary | ICD-10-CM

## 2024-08-07 DIAGNOSIS — M25475 Effusion, left foot: Secondary | ICD-10-CM

## 2024-08-07 LAB — POC COVID19 BINAXNOW: SARS Coronavirus 2 Ag: NEGATIVE

## 2024-08-07 MED ORDER — DOXYCYCLINE HYCLATE 100 MG PO TABS: 100.0000 mg | ORAL_TABLET | Freq: Two times a day (BID) | ORAL | 0 refills | Status: AC

## 2024-08-07 NOTE — Progress Notes (Signed)
 Patient ID: [name], male    DOB: [birth year], 78 y.o.   MRN: [id number]  Chief Complaint  Patient presents with   Foot Swelling    Bilateral  Swelling for a few month  Tingling on Rt foot  No Hx injury    Arm Pain    Rt arm  Pain on and off  Pain worsen with movement    Fatigue   Cough  Discussed the use of AI scribe software for clinical note transcription with the patient, who gave verbal consent to proceed.  History of Present Illness   [name] is a 78 year old male who presents with several concerns.  Swelling is present around the right ankle and foot, with tingling extending to the big toe. Swelling decreases at night and worsens during the day, starting around 10 or 11 AM. The left foot also swells but does not tingle. Tingling and numbness in the right foot began two days ago, described as a 'pins and needles' sensation. Current medications include Flomax, potassium supplements, irbesartan , hydrochlorothiazide , and a low dose of amlodipine . He manages borderline diabetes with diet and has recently increased salt intake.  He experiences intermittent pain in the right shoulder and arm, particularly around the elbow, possibly due to a recent strain. He had rotator cuff surgery on the same shoulder in 2008 and uses extra strength Tylenol  for pain relief.  He feels tired and fatigued over the past week, with a slight cough and sneezing, mostly at night. He uses Flonase  daily for sinus issues and has a history of sinus problems. No colored nasal discharge, only clear drainage, but has frontal headache and sinus pain.  He has a history of low potassium levels and takes potassium supplements. He has not taken potassium in about a week, following a regimen of one tablet for five days and then once a week. He is on Plavix  for heart-related issues. No calf pain or history of clots.     Assessment and Plan    Bilateral lower extremity edema and right foot  paresthesia Chronic edema with new right foot paresthesia. Possible neuropathy due to borderline glucose, age related nerve degeneration.  Amlodipine  unlikely cause due to lowest dose taken qhs.  Reports swelling improves overnight. High salt intake noted. .Cardiologist follow-up scheduled in November. - Encourage dietary modifications to reduce salt intake - Encourage increased water intake. - Recheck potassium level today. - Follow up with cardiologist on November 10th and discuss swelling.  - Call office back if swelling worsens.  Borderline elevated blood glucose (prediabetes) A1c at 6.3% 06/2024. Not on metformin. Diet and exercise advised.  - Encourage regular exercise with cardio, walking to improve circulation. - Increase water intake to 2.5L daily.  Hypokalemia, due to HCTZ Previous low potassium. Current supplementation may be insufficient. - Recheck potassium level today. - Adjust potassium supplementation based on lab results.  Right arm pain and chronic right shoulder pain, status post rotator cuff repair Intermittent pain post-surgery. No recent imaging or evaluation. Denies any injury or recent overuse. - Refer to orthopedic office in [place] for evaluation and potential physical therapy or corticosteroid injection.  Acute sinusitis with cough Symptoms suggest uncontrolled allergic rhinitis that has turned into a sinus infection. Also reports dry cough and fatigue. Allergic to penicillin. Doxycycline  previously tolerated. Rapid covid test negative. - Prescribe doxycycline  100 mg twice daily for one week. - Encourage continued use of Flonase  every day 1 squirt each nostril, can increase to twice a day  if sx persist for up to a week, then reduce to daily when sx are improved.      Subjective:    Outpatient Medications Prior to Visit  Medication Sig Dispense Refill   acyclovir  ointment (ZOVIRAX ) 5 % Apply topically every 3 (three) hours. For fever blisters up to 7 days 5  g 2   amLODipine  (NORVASC ) 2.5 MG tablet Take 1 tablet (2.5 mg total) by mouth daily. 90 tablet 3   atorvastatin  (LIPITOR) 10 MG tablet TAKE 1 TABLET EVERY DAY 90 tablet 3   clopidogrel  (PLAVIX ) 75 MG tablet TAKE 1 TABLET EVERY DAY 90 tablet 3   famotidine  (PEPCID ) 40 MG tablet Take 1 tablet (40 mg total) by mouth daily. 30 tablet 11   fluticasone  (CUTIVATE ) 0.05 % cream Apply topically 2 (two) times daily as needed.     fluticasone  (FLONASE ) 50 MCG/ACT nasal spray INHALE 1 SPRAY INTO EACH NOSTRIL ONCE DAILY. 16 g 3   irbesartan -hydrochlorothiazide  (AVALIDE) 150-12.5 MG tablet TAKE 2 TABLETS EVERY DAY 180 tablet 3   mercaptopurine  (PURINETHOL ) 50 MG tablet Take 1.5 tablets (75 mg total) by mouth daily. Give on an empty stomach 1 hour before or 2 hours after meals. 45 tablet 1   metroNIDAZOLE (METROGEL) 0.75 % gel Apply 1 Application topically 2 (two) times daily. From dermatology for rosacea     nitroGLYCERIN  (NITROSTAT ) 0.4 MG SL tablet PLACE 1 TAB UNDER TONGUE EVERY 5 MIN IF NEEDED FOR CHEST PAIN. MAY USE 3 TIMES.NO RELIEF CALL 911. 25 tablet 0   ondansetron  (ZOFRAN ) 4 MG tablet Take 1 tablet (4 mg total) by mouth every 8 (eight) hours as needed for nausea or vomiting. 20 tablet 0   potassium chloride  SA (KLOR-CON  M) 20 MEQ tablet Take 1 tablet daily x 5 days, then 1 tablet weekly 30 tablet 3   tamsulosin (FLOMAX) 0.4 MG CAPS capsule Take 0.4 mg by mouth daily.      doxycycline  (VIBRAMYCIN ) 50 MG capsule Take 50 mg by mouth daily. For rosacea from dermatology     No facility-administered medications prior to visit.   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  Constitutional:      General: He is not in acute distress.    Appearance: Normal appearance. He is obese.  HENT:     Head: Normocephalic.     Right Ear: Tympanic membrane and  ear canal normal.     Left Ear: Tympanic membrane and ear canal normal.     Nose:     Right Sinus: Frontal sinus tenderness present. No maxillary sinus tenderness.     Left Sinus: Frontal sinus tenderness present. No maxillary sinus tenderness.     Mouth/Throat:     Mouth: Mucous membranes are moist.     Pharynx: No pharyngeal swelling, oropharyngeal exudate, posterior oropharyngeal erythema or uvula swelling.     Tonsils: No tonsillar exudate or tonsillar abscesses.  Cardiovascular:     Rate and Rhythm: Normal rate and regular rhythm.  Pulmonary:     Effort: Pulmonary effort is normal.     Breath sounds: Normal breath sounds.  Musculoskeletal:     Right shoulder: Tenderness present. No swelling or bony tenderness. Decreased range of motion. Decreased strength.     Cervical back: Normal range of motion.     Right lower leg: 1+ Edema present.     Left lower leg: No edema.  Lymphadenopathy:     Head:     Right side of head: No preauricular or posterior auricular adenopathy.     Left side of head: No preauricular or posterior auricular adenopathy.     Cervical: No cervical adenopathy.  Skin:    General: Skin is  warm and dry.  Neurological:     Mental Status: He is alert and oriented to person, place, and time.  Psychiatric:        Mood and Affect: Mood normal.    BP 124/76   Pulse 69   Temp (!) 97.5 F (36.4 C) (Temporal)   Ht 5' 8 (1.727 m)   Wt 215 lb (97.5 kg)   SpO2 97%   BMI 32.69 kg/m  Wt Readings from Last 3 Encounters:  08/07/24 215 lb (97.5 kg)  06/26/24 212 lb 9.6 oz (96.4 kg)  12/22/23 212 lb 12.8 oz (96.5 kg)       Lucius Krabbe, NP

## 2024-08-07 NOTE — Telephone Encounter (Signed)
 FYI Only or Action Required?: Action required by provider: request for appointment.  Patient was last seen in primary care on 06/26/2024 by Katrinka Garnette KIDD, MD.  Called Nurse Triage reporting Foot Swelling.  Symptoms began doesn't know.  Interventions attempted: Nothing.  Symptoms are: gradually worsening.  Triage Disposition: See PCP When Office is Open (Within 3 Days)  Patient/caregiver understands and will follow disposition?: YesCopied from CRM #[id number]. Topic: Clinical - Red Word Triage >> Aug 07, 2024 11:01 AM Turkey A wrote: [facility] that prompted transfer to Nurse Triage: Swelling of ankles, pain in right arm for a while that comes and goes, and feel like the Flu Reason for Disposition  MODERATE ankle swelling (e.g., interferes with normal activities, can't move joint normally) (Exceptions: Itchy, localized swelling; swelling is chronic.)  Answer Assessment - Initial Assessment Questions I feel bad over last 2 weeks. Right is worse and is tingling. Swells more throughout day. I can wear my shoes.  Pt denies SOB.   1. LOCATION: Which ankle is swollen? Where is the swelling?     Both but right is worse 2. ONSET: When did the swelling start?     Not sure 3. SWELLING: How bad is the swelling? Or, How large is it? (e.g., mild, moderate, severe; size of localized swelling)      Goes down at night 4. PAIN: Is there any pain? If Yes, ask: How bad is it? (Scale 0-10; or none, mild, moderate, severe)     denies 5. CAUSE: What do you think caused the ankle swelling?     Not sure 6. OTHER SYMPTOMS: Do you have any other symptoms? (e.g., fever, chest pain, difficulty breathing, calf pain) Pain in right arm that comes and goes  Protocols used: Ankle Swelling-A-AH

## 2024-08-07 NOTE — Telephone Encounter (Signed)
 Appt today

## 2024-08-08 ENCOUNTER — Ambulatory Visit: Payer: Self-pay | Admitting: Family

## 2024-08-08 DIAGNOSIS — E876 Hypokalemia: Secondary | ICD-10-CM

## 2024-08-08 LAB — BASIC METABOLIC PANEL WITH GFR
BUN: 13 mg/dL (ref 6–23)
CO2: 30 meq/L (ref 19–32)
Calcium: 9.8 mg/dL (ref 8.4–10.5)
Chloride: 97 meq/L (ref 96–112)
Creatinine, Ser: 1.32 mg/dL (ref 0.40–1.50)
GFR: 52.12 mL/min — ABNORMAL LOW (ref >60.00)
Glucose, Bld: 86 mg/dL (ref 70–99)
Potassium: 3.4 meq/L — ABNORMAL LOW (ref 3.5–5.1)
Sodium: 137 meq/L (ref 135–145)

## 2024-08-08 MED ORDER — POTASSIUM CHLORIDE CRYS ER 20 MEQ PO TBCR
20.0000 meq | EXTENDED_RELEASE_TABLET | ORAL | 3 refills | Status: AC
Start: 2024-08-08 — End: ?

## 2024-08-08 NOTE — Progress Notes (Signed)
 Potassium a little low still. He should increase his potassium pill to twice a week. Remember to drink at least 2L water every day. Thx

## 2024-08-15 ENCOUNTER — Other Ambulatory Visit: Payer: Self-pay | Admitting: Family Medicine

## 2024-08-15 ENCOUNTER — Other Ambulatory Visit: Payer: Self-pay

## 2024-08-15 MED ORDER — FAMOTIDINE 40 MG PO TABS: 40.0000 mg | ORAL_TABLET | Freq: Every day | ORAL | 1 refills | Status: DC

## 2024-08-29 ENCOUNTER — Telehealth: Payer: Self-pay | Admitting: Family Medicine

## 2024-08-29 ENCOUNTER — Ambulatory Visit: Payer: Self-pay

## 2024-08-29 NOTE — Telephone Encounter (Signed)
 He saw stephanie nearly 3 weeksgo for this- unfortunately I think he needs an updated evaluation to determine if that is what he needs

## 2024-08-29 NOTE — Telephone Encounter (Signed)
 Patient requesting refill of Doxycycline  for scratchy throat, cough, and nasal pressure.    Copied from CRM [id number]. Topic: General - Other >> Aug 29, 2024  1:40 PM Nessti S wrote: Reason for CRM: patient was returning call to [facility] concerning refill. Call back number [phone]

## 2024-08-29 NOTE — Telephone Encounter (Unsigned)
 Copied from CRM [id number]. Topic: Clinical - Medication Refill >> Aug 29, 2024 11:14 AM Ahlexyia S wrote: Medication: doxycycline  (VIBRA -TABS) 100 MG tablet  Has the patient contacted their pharmacy? No, pt was not informed that he could contact his pharmacy for refills. (Agent: If no, request that the patient contact the pharmacy for the refill. If patient does not wish to contact the pharmacy document the reason why and proceed with request.) (Agent: If yes, when and what did the pharmacy advise?)  This is the patient's preferred pharmacy:  [facility] [place], [place] - [id number] [place] [address] Phone: [phone] Fax: [phone]  Is this the correct pharmacy for this prescription? Yes If no, delete pharmacy and type the correct one.   Has the prescription been filled recently? Yes  Is the patient out of the medication? Yes  Has the patient been seen for an appointment in the last year OR does the patient have an upcoming appointment? Yes  Can we respond through MyChart? No, pt would like a phone call if this can be refilled or not.  Agent: Please be advised that Rx refills may take up to 3 business days. We ask that you follow-up with your pharmacy.

## 2024-08-29 NOTE — Telephone Encounter (Signed)
 Per chart review, patient has been in contact with office.

## 2024-09-02 ENCOUNTER — Encounter: Payer: Self-pay | Admitting: Family Medicine

## 2024-09-02 ENCOUNTER — Ambulatory Visit (INDEPENDENT_AMBULATORY_CARE_PROVIDER_SITE_OTHER): Admitting: Family Medicine

## 2024-09-02 VITALS — BP 122/68 | HR 69 | Temp 97.9°F | Ht 68.0 in | Wt 212.8 lb

## 2024-09-02 DIAGNOSIS — R059 Cough, unspecified: Secondary | ICD-10-CM | POA: Diagnosis not present

## 2024-09-02 DIAGNOSIS — N183 Chronic kidney disease, stage 3 unspecified: Secondary | ICD-10-CM | POA: Diagnosis not present

## 2024-09-02 DIAGNOSIS — M25511 Pain in right shoulder: Secondary | ICD-10-CM | POA: Diagnosis not present

## 2024-09-02 DIAGNOSIS — I10 Essential (primary) hypertension: Secondary | ICD-10-CM

## 2024-09-02 DIAGNOSIS — G8929 Other chronic pain: Secondary | ICD-10-CM

## 2024-09-02 LAB — POCT INFLUENZA A/B
Influenza A, POC: NEGATIVE
Influenza B, POC: NEGATIVE

## 2024-09-02 LAB — POC COVID19 BINAXNOW: SARS Coronavirus 2 Ag: NEGATIVE

## 2024-09-02 LAB — POCT RAPID STREP A (OFFICE): Rapid Strep A Screen: NEGATIVE

## 2024-09-02 MED ORDER — GUAIFENESIN-CODEINE 100-10 MG/5ML PO SOLN: 5.0000 mL | Freq: Four times a day (QID) | ORAL | 0 refills | Status: DC | PRN

## 2024-09-02 MED ORDER — DOXYCYCLINE HYCLATE 100 MG PO TABS: 100.0000 mg | ORAL_TABLET | Freq: Two times a day (BID) | ORAL | 0 refills | Status: AC

## 2024-09-02 NOTE — Patient Instructions (Addendum)
 Prolong antibiotics to 10 days this time  Also gave cough medicine- try to space to 8 hours if possible or if you do it every 6 hours only take a smaller dose   If not better in 10-14 days you said we could do an x-ray so reach out to us  or sooner if symptoms worsen  Recommended follow up: Return for as needed for new, worsening, persistent symptoms.

## 2024-09-02 NOTE — Progress Notes (Signed)
 Phone [phone] In person visit   Subjective:   [name] is a 78 y.o. year old very pleasant male patient who presents for/with See problem oriented charting Chief Complaint  Patient presents with   Sinus Problem    Ongoing since last week in September; would like refill on antibiotics; patient not feeling any better since last Tuesday; nonproductive constant cough, chest congestion; when he swallows food his throat feels tight;     Past Medical History-  Patient Active Problem List   Diagnosis Date Noted   ULCERATIVE COLITIS-LEFT SIDE 07/03/2008    Priority: High   CAD (coronary artery disease) 06/15/2007    Priority: High   Aortic atherosclerosis 04/20/2021    Priority: Medium    Hyperglycemia 06/08/2015    Priority: Medium    CKD (chronic kidney disease), stage III (HCC) 10/24/2014    Priority: Medium    Sleep apnea 07/02/2008    Priority: Medium    Hyperlipidemia 06/14/2007    Priority: Medium    Essential hypertension 06/14/2007    Priority: Medium    History of colonic polyps 06/14/2007    Priority: Medium    Postherpetic neuralgia 03/19/2018    Priority: Low   Balanitis 03/06/2017    Priority: Low   OA (osteoarthritis) of knee 12/07/2015    Priority: Low   Former smoker 06/08/2015    Priority: Low   Edema 02/04/2015    Priority: Low   Allergic rhinitis 10/24/2014    Priority: Low   Obesity (BMI 30-39.9) 04/25/2014    Priority: Low   Right cervical radiculopathy 04/12/2013    Priority: Low   Low back pain 01/11/2008    Priority: Low   GERD 11/07/2007    Priority: Low   Trigger finger, acquired 10/23/2007    Priority: Low   Actinic keratosis 10/10/2018    Medications- reviewed and updated Current Outpatient Medications  Medication Sig Dispense Refill   acyclovir  ointment (ZOVIRAX ) 5 % Apply topically every 3 (three) hours. For fever blisters up to 7 days 5 g 2   amLODipine  (NORVASC ) 2.5 MG tablet TAKE 1 TABLET(2.5 MG) BY MOUTH DAILY 90  tablet 3   atorvastatin  (LIPITOR) 10 MG tablet TAKE 1 TABLET EVERY DAY 90 tablet 3   clopidogrel  (PLAVIX ) 75 MG tablet TAKE 1 TABLET EVERY DAY 90 tablet 3   doxycycline  (VIBRA -TABS) 100 MG tablet Take 1 tablet (100 mg total) by mouth 2 (two) times daily for 10 days. 20 tablet 0   famotidine  (PEPCID ) 40 MG tablet Take 1 tablet (40 mg total) by mouth daily. 30 tablet 1   fluticasone  (CUTIVATE ) 0.05 % cream Apply topically 2 (two) times daily as needed.     fluticasone  (FLONASE ) 50 MCG/ACT nasal spray INHALE 1 SPRAY INTO EACH NOSTRIL ONCE DAILY. 16 g 3   guaiFENesin -codeine 100-10 MG/5ML syrup Take 5 mLs by mouth every 6 (six) hours as needed for cough (do not drive for 8 hours after use). 120 mL 0   irbesartan -hydrochlorothiazide  (AVALIDE) 150-12.5 MG tablet TAKE 2 TABLETS EVERY DAY 180 tablet 3   mercaptopurine  (PURINETHOL ) 50 MG tablet Take 1.5 tablets (75 mg total) by mouth daily. Give on an empty stomach 1 hour before or 2 hours after meals. 45 tablet 1   metroNIDAZOLE (METROGEL) 0.75 % gel Apply 1 Application topically 2 (two) times daily. From dermatology for rosacea     nitroGLYCERIN  (NITROSTAT ) 0.4 MG SL tablet PLACE 1 TAB UNDER TONGUE EVERY 5 MIN IF NEEDED FOR CHEST PAIN.  MAY USE 3 TIMES.NO RELIEF CALL 911. 25 tablet 0   ondansetron  (ZOFRAN ) 4 MG tablet Take 1 tablet (4 mg total) by mouth every 8 (eight) hours as needed for nausea or vomiting. 20 tablet 0   potassium chloride  SA (KLOR-CON  M) 20 MEQ tablet Take 1 tablet (20 mEq total) by mouth 2 (two) times a week. 30 tablet 3   tamsulosin (FLOMAX) 0.4 MG CAPS capsule Take 0.4 mg by mouth daily.      No current facility-administered medications for this visit.     Objective:  BP 122/68 (BP Location: Left Arm, Patient Position: Sitting, Cuff Size: Normal)   Pulse 69   Temp 97.9 F (36.6 C) (Temporal)   Ht 5' 8 (1.727 m)   Wt 212 lb 12.8 oz (96.5 kg)   SpO2 97%   BMI 32.36 kg/m  Gen: NAD, resting comfortably Clear bilaterally,  frontal and maxillary sinus tenderness bilaterally, nasal turbinates edematous and erythematous, pharynx with drainage noted CV: RRR no murmurs rubs or gallops Lungs: CTAB no crackles, wheeze, rhonchi Zku:umjrz edema Skin: warm, dry   Results for orders placed or performed in visit on 09/02/24 (from the past 24 hours)  POCT rapid strep A     Status: None   Collection Time: 09/02/24  4:27 PM  Result Value Ref Range   Rapid Strep A Screen Negative Negative  POCT Influenza A/B     Status: None   Collection Time: 09/02/24  4:27 PM  Result Value Ref Range   Influenza A, POC Negative Negative   Influenza B, POC Negative Negative  POC COVID-19     Status: None   Collection Time: 09/02/24  4:28 PM  Result Value Ref Range   SARS Coronavirus 2 Ag Negative Negative       Assessment and Plan   # Cough/congestion S: Patient was seen on 08/07/2024 and reported fatigue for a week as well as slight cough and sneezing.  Was trying Flonase  for sinus issues.  Lingering allergies even before this and they were concerned that this may have turned into a sinus infection.  Penicillin allergy so was treated with doxycycline .  COVID test was negative. He reports got about 75% better but then things stagnated and gradually worsened.   Patient reached back out on 08/29/2024 requesting a refill on doxycycline  reporting ongoing scratchy throat, cough and nasal pressure and I recommended repeat evaluation  Today he reports symptoms seem to worsen last Tuesday.  Nonproductive constant cough.  Feels like he has chest congestion (worse than before and nonproductive).  Some tightness in his throat when he swallows but no difficulty getting food down. Some sinus pressure. No fever or chills. No shortness of breath or wheezing.  -tessalon  does not help him much - has not tried delsym- did do corricidin hbp  A/P: Patient with cough congestion and sinus pressure since at least mid September.  With worsening we checked  flu and COVID which were negative.  Also had some scratchy throat and strep throat was negative.  Previously treated with doxycycline  with approximately 75% improvement but symptoms gradually worsened particularly peaking about a week ago - Concerning for inadequately treated sinusitis.  Has penicillin allergy so we will retreat with doxycycline  but extend to 10 days -Considering cough 6 weeks also offered chest x-ray but he would like to try the above treatment along with some cough medicine and follow-up if fails to improve    #Chronic kidney disease stage III S: GFR is typically in  the high 50s range -Patient knows to avoid NSAIDs  A/P: CKD III noted. With his GFR I had written for q6 hours on th ecodeine cough syrup but we discussed using every 8 hours instead or if uses 6 hours using a half dose to renally adjust down just in case he doesn't metabolize well   #hypertension S: medication: amlodipine  2.5 mg, irbesartan  hydrochlorothiazide  150-12.5 mg BP Readings from Last 3 Encounters:  09/02/24 122/68  08/07/24 124/76  06/26/24 110/68  A/P: blood pressure well controlled continue current medications   #Right shoulder pain- history of shoulder surgery with [facility]- last visit was referred to [facility] but hed like to go back to original team- referral placed today. Ongoing chronic pain  Recommended follow up: Return for as needed for new, worsening, persistent symptoms. Future Appointments  Date Time Provider Department Center  09/03/2024 10:40 AM [facility] VISIT 1 [facility]  09/10/2024 10:40 AM Craig Alan JONELLE DEVONNA [facility]  09/23/2024  9:20 AM Okey Vina GAILS, MD [facility]  01/09/2025  9:00 AM Katrinka Garnette KIDD, MD [facility]    Lab/Order associations:   ICD-10-CM   1. Cough, unspecified type  R05.9 POCT rapid strep A    POCT Influenza A/B    POC COVID-19    2. Chronic right shoulder pain  M25.511 Ambulatory  referral to Orthopedic Surgery   G89.29     3. Stage 3 chronic kidney disease, unspecified whether stage 3a or 3b CKD (HCC)  N18.30     4. Essential hypertension  I10       Meds ordered this encounter  Medications   doxycycline  (VIBRA -TABS) 100 MG tablet    Sig: Take 1 tablet (100 mg total) by mouth 2 (two) times daily for 10 days.    Dispense:  20 tablet    Refill:  0   guaiFENesin -codeine 100-10 MG/5ML syrup    Sig: Take 5 mLs by mouth every 6 (six) hours as needed for cough (do not drive for 8 hours after use).    Dispense:  120 mL    Refill:  0    Return precautions advised.  Garnette Katrinka, MD

## 2024-09-03 ENCOUNTER — Ambulatory Visit: Payer: Medicare HMO

## 2024-09-03 VITALS — Ht 68.0 in | Wt 212.0 lb

## 2024-09-03 DIAGNOSIS — Z0000 Encounter for general adult medical examination without abnormal findings: Secondary | ICD-10-CM | POA: Diagnosis not present

## 2024-09-03 NOTE — Progress Notes (Signed)
 Subjective:   [name] is a 78 y.o. who presents for a Medicare Wellness preventive visit.  As a reminder, Annual Wellness Visits don't include a physical exam, and some assessments may be limited, especially if this visit is performed virtually. We may recommend an in-person follow-up visit with your provider if needed.  Visit Complete: Virtual I connected with  Mitchell Lemond Dustman on 09/03/24 by a audio enabled telemedicine application and verified that I am speaking with the correct person using two identifiers.  Patient Location: Home  Provider Location: Office/Clinic  I discussed the limitations of evaluation and management by telemedicine. The patient expressed understanding and agreed to proceed.  Vital Signs: Because this visit was a virtual/telehealth visit, some criteria may be missing or patient reported. Any vitals not documented were not able to be obtained and vitals that have been documented are patient reported.  VideoDeclined- This patient declined Librarian, academic. Therefore the visit was completed with audio only.  Persons Participating in Visit: Patient.  AWV Questionnaire: No: Patient Medicare AWV questionnaire was not completed prior to this visit.  Cardiac Risk Factors include: advanced age (>53men, >71 women)     Objective:    Today's Vitals   09/03/24 1033  Weight: 212 lb (96.2 kg)  Height: 5' 8 (1.727 m)   Body mass index is 32.23 kg/m.     09/03/2024   10:37 AM 08/21/2023    1:58 PM 11/10/2022    1:14 PM 08/15/2022    2:00 PM 08/05/2021    1:54 PM 07/30/2020    2:38 PM 07/03/2019   12:01 PM  Advanced Directives  Does Patient Have a Medical Advance Directive? No No No No Yes No No  Does patient want to make changes to medical advance directive?   No - Patient declined  Yes (MAU/Ambulatory/Procedural Areas - Information given)    Would patient like information on creating a medical advance directive? No -  Patient declined No - Patient declined  No - Patient declined   Yes (MAU/Ambulatory/Procedural Areas - Information given)      Data saved with a previous flowsheet row definition    Current Medications (verified) Outpatient Encounter Medications as of 09/03/2024  Medication Sig   acyclovir  ointment (ZOVIRAX ) 5 % Apply topically every 3 (three) hours. For fever blisters up to 7 days   amLODipine  (NORVASC ) 2.5 MG tablet TAKE 1 TABLET(2.5 MG) BY MOUTH DAILY   atorvastatin  (LIPITOR) 10 MG tablet TAKE 1 TABLET EVERY DAY   clopidogrel  (PLAVIX ) 75 MG tablet TAKE 1 TABLET EVERY DAY   doxycycline  (VIBRA -TABS) 100 MG tablet Take 1 tablet (100 mg total) by mouth 2 (two) times daily for 10 days.   famotidine  (PEPCID ) 40 MG tablet Take 1 tablet (40 mg total) by mouth daily.   fluticasone  (CUTIVATE ) 0.05 % cream Apply topically 2 (two) times daily as needed.   fluticasone  (FLONASE ) 50 MCG/ACT nasal spray INHALE 1 SPRAY INTO EACH NOSTRIL ONCE DAILY.   guaiFENesin -codeine 100-10 MG/5ML syrup Take 5 mLs by mouth every 6 (six) hours as needed for cough (do not drive for 8 hours after use).   irbesartan -hydrochlorothiazide  (AVALIDE) 150-12.5 MG tablet TAKE 2 TABLETS EVERY DAY   mercaptopurine  (PURINETHOL ) 50 MG tablet Take 1.5 tablets (75 mg total) by mouth daily. Give on an empty stomach 1 hour before or 2 hours after meals.   metroNIDAZOLE (METROGEL) 0.75 % gel Apply 1 Application topically 2 (two) times daily. From dermatology for rosacea  nitroGLYCERIN  (NITROSTAT ) 0.4 MG SL tablet PLACE 1 TAB UNDER TONGUE EVERY 5 MIN IF NEEDED FOR CHEST PAIN. MAY USE 3 TIMES.NO RELIEF CALL 911.   ondansetron  (ZOFRAN ) 4 MG tablet Take 1 tablet (4 mg total) by mouth every 8 (eight) hours as needed for nausea or vomiting.   potassium chloride  SA (KLOR-CON  M) 20 MEQ tablet Take 1 tablet (20 mEq total) by mouth 2 (two) times a week.   tamsulosin (FLOMAX) 0.4 MG CAPS capsule Take 0.4 mg by mouth daily.    No  facility-administered encounter medications on file as of 09/03/2024.    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   UTI (urinary tract infection) 2019   Past Surgical History:  Procedure Laterality Date   ANGIOPLASTY  11/15/1991   cataract left     feb 2024- will need the right eventually   CHOLECYSTECTOMY  11/14/2000   LUMBAR FUSION  11/14/1981   ROTATOR CUFF REPAIR  11/15/2007   right   TRIGGER FINGER RELEASE     right   Family History  Problem Relation Age of Onset   Stroke Mother    COPD Father    Heart disease Sister        valve replacement   CAD Brother    Heart attack Brother    Colon cancer Neg Hx    Esophageal cancer Neg Hx    Liver disease Neg Hx    Diabetes Neg Hx    Rectal cancer Neg Hx    Stomach cancer Neg Hx    Social History   Socioeconomic History   Marital status: Widowed    Spouse name: Not on file   Number of children: 3   Years of education: Not on file   Highest education level: Not on file  Occupational History   Occupation: apartment maintenance    Employer: [employer]    Comment: retired  Tobacco Use   Smoking status: Former    Current packs/day: 0.00    Average packs/day: 1 pack/day for 3.0 years (3.0 ttl pk-yrs)    Types: Cigarettes    Start date: 11/15/1963    Quit date: 11/14/1966    Years since quitting: 57.8   Smokeless tobacco: Former    Types: Chew   Tobacco comments:    quit 50 years ago  Vaping  Use   Vaping status: Never Used  Substance and Sexual Activity   Alcohol use: No    Alcohol/week: 0.0 standard drinks of alcohol   Drug use: No   Sexual activity: Not on file  Other Topics Concern   Not on file  Social History Narrative   Widower January 2019.. 3 children- son is a patient here. 4 grandkids. 1 greatgrandchildi in [place] in 2025      Retired from Ha[employer]      Hobbies: work on an old truck- expensive to work on so a little at  a time, mow the kids yards, enjoys yardwork      Pt does not get regular exercise--daily caffeine use-5 cups daily   Social Drivers of Health   Financial Resource Strain: Low Risk  (09/03/2024)   Overall Financial Resource Strain (CARDIA)    Difficulty of Paying Living Expenses: Not hard at all  Food Insecurity: No Food Insecurity (09/03/2024)   Hunger Vital Sign    Worried About Running Out of Food in the Last Year: Never true    Ran Out of Food in the Last Year: Never true  Transportation Needs: No Transportation Needs (09/03/2024)   PRAPARE - Administrator, Civil Service (Medical): No    Lack of Transportation (Non-Medical): No  Physical Activity: Inactive (09/03/2024)   Exercise Vital Sign    Days of Exercise per Week: 0 days    Minutes of Exercise per Session: 0 min  Stress: No Stress Concern Present (09/03/2024)   [employer] of Occupational Health - Occupational Stress Questionnaire    Feeling of Stress: Not at all  Social Connections: Moderately Isolated (09/03/2024)   Social Connection and Isolation Panel    Frequency of Communication with Friends and Family: More than three times a week    Frequency of Social Gatherings with Friends and Family: Three times a week    Attends Religious Services: More than 4 times per year    Active Member of Clubs or Organizations: No    Attends Banker Meetings: Never    Marital Status: Widowed    Tobacco Counseling Counseling  given: Not Answered Tobacco comments: quit 50 years ago    Clinical Intake:  Pre-visit preparation completed: Yes  Pain : No/denies pain     BMI - recorded: 32.23 Nutritional Status: BMI > 30  Obese Nutritional Risks: None Diabetes: No  Lab Results  Component Value Date   HGBA1C 6.3 06/26/2024   HGBA1C 6.0 12/22/2023   HGBA1C 5.8 11/04/2022     How often do you need to have someone help you when you read instructions, pamphlets, or other written materials from your doctor or pharmacy?: 1 - Never  Interpreter Needed?: No  Information entered by :: Ellouise Haws, LPN   Activities of Daily Living      09/03/2024   10:41 AM  In your present state of health, do you have any difficulty performing the following activities:  Hearing? 1  Comment slioght HOH  Vision? 0  Difficulty concentrating or making decisions? 0  Walking or climbing stairs? 0  Dressing or bathing? 0  Doing errands, shopping? 0  Preparing Food and eating ? N  Using the Toilet? N  In the past six months, have you accidently leaked urine? N  Do you have problems with loss of bowel control? N  Managing your Medications? N  Managing your Finances? N  Housekeeping or managing your Housekeeping? N    Patient Care Team: Katrinka Garnette KIDD, MD as PCP - General (Family Medicine) Okey Vina GAILS, MD as PCP - Cardiology (Cardiology) Pandora Cadet, [facility] as Pharmacist (Pharmacist) Nicholaus Sherlean CROME, [facility] (Inactive) (Pharmacist)   I have updated your Care Teams any recent Medical Services you may have received from other providers in the past year.     Assessment:   This is a routine wellness examination for Jesiah.  Hearing/Vision screen Hearing Screening - Comments:: Pt stated slight HOH  Vision Screening - Comments:: Wears rx glasses - up to date with routine eye exams with [facility]    Goals Addressed             This Visit's Progress    Patient Stated       Get back to the gym         Depression Screen      09/03/2024   10:37 AM 08/07/2024    2:26 PM 06/26/2024   10:30 AM 08/21/2023    1:59 PM 05/05/2023   12:59 PM 01/25/2023   10:04 AM 08/15/2022    1:59 PM  PHQ 2/9 Scores  PHQ - 2 Score 0 0 0 0 1 0 0  PHQ- 9 Score 0 0 0  3 3     Fall Risk      09/03/2024   10:39 AM 09/02/2024    4:07 PM 08/07/2024    2:26 PM 06/26/2024   10:30 AM 08/21/2023    2:00 PM  Fall Risk   Falls in the past year? 0 0 0 0 0  Number falls in past yr: 0 0 0 0 0  Injury with Fall? 0 0 0 0 0  Risk for fall due to : No Fall Risks No Fall Risks No Fall Risks No Fall Risks Impaired vision  Follow up Falls prevention discussed Falls evaluation completed  Falls evaluation completed Falls prevention discussed    MEDICARE RISK AT HOME:   Medicare Risk at Home Any stairs in or around the home?: No If so, are there any without handrails?: No Home free of loose throw rugs in walkways, pet beds, electrical cords, etc?: Yes Adequate lighting in your home to reduce risk of falls?: Yes Life alert?: No Use of a cane, walker or w/c?: No Grab bars in the bathroom?: No Shower chair or bench in shower?: No Elevated toilet seat or a handicapped toilet?: No  TIMED UP AND GO:  Was the test performed?  No  Cognitive Function: 6CIT completed    03/01/2018    8:32 AM 10/18/2016    1:34 PM  MMSE - Mini Mental State Exam  Not completed: --   Orientation to time  5   Orientation to Place  5   Registration  3   Attention/ Calculation  5   Recall  3   Language- name 2 objects  2   Language- repeat  1  Language- follow 3 step command  3   Language- read & follow direction  1   Write a sentence  1   Copy design  1   Total score  30      Data saved with a previous flowsheet row definition        09/03/2024   10:39 AM 08/21/2023    2:01 PM 08/15/2022    2:02 PM 08/05/2021    1:56 PM 07/30/2020    2:42 PM  6CIT Screen  What Year? 0 points 0 points 0 points 0 points 0 points  What month? 0  points 0 points 0 points 0 points 0 points  What time? 0 points 0 points 0 points 0 points   Count back from 20 0 points 0 points 0 points 0 points 0 points  Months in reverse 0 points 0 points 2 points 0 points 0 points  Repeat phrase 0 points 0 points 0 points 0 points 0 points  Total Score 0 points 0 points 2 points 0 points     Immunizations Immunization History  Administered Date(s) Administered   Fluad Quad(high Dose 65+) 08/31/2020, 09/13/2021, 08/31/2022,  09/24/2023   INFLUENZA, HIGH DOSE SEASONAL PF 09/13/2016, 09/01/2017, 08/22/2018, 08/21/2019, 09/24/2023   Influenza Split 08/19/2011, 08/17/2012   Influenza Whole 10/23/2007, 08/14/2009, 08/13/2010, 08/23/2019   Influenza,inj,Quad PF,6+ Mos 08/16/2013   Influenza-Unspecified 08/28/2014, 09/07/2015, 09/01/2017, 09/13/2018, 08/26/2022   Moderna Sars-Covid-2 Vaccination 12/27/2019, 01/24/2020, 10/04/2020, 06/25/2021   Pneumococcal Conjugate-13 06/08/2015   Pneumococcal Polysaccharide-23 04/25/2014   Td 11/14/2000, 09/21/2010   Tdap 11/11/2022   Zoster Recombinant(Shingrix) 09/24/2018, 11/11/2022, 04/05/2023    Screening Tests Health Maintenance  Topic Date Due   Influenza Vaccine  02/11/2025 (Originally 06/14/2024)   Colonoscopy  06/26/2025 (Originally 04/28/2024)   Medicare Annual Wellness (AWV)  09/03/2025   Pneumococcal Vaccine: 50+ Years  Completed   Hepatitis C Screening  Completed   Zoster Vaccines- Shingrix  Completed   Meningococcal B Vaccine  Aged Out   DTaP/Tdap/Td  Discontinued   COVID-19 Vaccine  Discontinued    Health Maintenance Items Addressed: See Nurse Notes at the end of this note  Additional Screening:  Vision Screening: Recommended annual ophthalmology exams for early detection of glaucoma and other disorders of the eye. Is the patient up to date with their annual eye exam?  Yes  Who is the provider or what is the name of the office in which the patient attends annual eye exams? [facility]    Dental Screening: Recommended annual dental exams for proper oral hygiene  Community Resource Referral / Chronic Care Management: CRR required this visit?  No   CCM required this visit?  No   Plan:    I have personally reviewed and noted the following in the patient's chart:   Medical and social history Use of alcohol, tobacco or illicit drugs  Current medications and supplements including opioid prescriptions. Patient is not currently taking opioid prescriptions. Functional ability and status Nutritional status Physical activity Advanced directives List of other physicians Hospitalizations, surgeries, and ER visits in previous 12 months Vitals Screenings to include cognitive, depression, and falls Referrals and appointments  In addition, I have reviewed and discussed with patient certain preventive protocols, quality metrics, and best practice recommendations. A written personalized care plan for preventive services as well as general preventive health recommendations were provided to patient.   Ellouise VEAR Haws, LPN   [id number]   After Visit Summary: (MyChart) Due to this being a telephonic visit, the after visit summary with patients personalized plan was offered to patient via MyChart   Notes: Nothing significant to report at this time.

## 2024-09-03 NOTE — Patient Instructions (Signed)
 [name],  Thank you for taking the time for your Medicare Wellness Visit. I appreciate your continued commitment to your health goals. Please review the care plan we discussed, and feel free to reach out if I can assist you further.  Medicare recommends these wellness visits once per year to help you and your care team stay ahead of potential health issues. These visits are designed to focus on prevention, allowing your provider to concentrate on managing your acute and chronic conditions during your regular appointments.  Please note that Annual Wellness Visits do not include a physical exam. Some assessments may be limited, especially if the visit was conducted virtually. If needed, we may recommend a separate in-person follow-up with your provider.  Ongoing Care Seeing your primary care provider every 3 to 6 months helps us  monitor your health and provide consistent, personalized care.   Referrals If a referral was made during today's visit and you haven't received any updates within two weeks, please contact the referred provider directly to check on the status.  Recommended Screenings:  Health Maintenance  Topic Date Due   Flu Shot  02/11/2025*   Colon Cancer Screening  06/26/2025*   Medicare Annual Wellness Visit  09/03/2025   Pneumococcal Vaccine for age over 71  Completed   Hepatitis C Screening  Completed   Zoster (Shingles) Vaccine  Completed   Meningitis B Vaccine  Aged Out   DTaP/Tdap/Td vaccine  Discontinued   COVID-19 Vaccine  Discontinued  *Topic was postponed. The date shown is not the original due date.       09/03/2024   10:37 AM  Advanced Directives  Does Patient Have a Medical Advance Directive? No  Would patient like information on creating a medical advance directive? No - Patient declined   Advance Care Planning is important because it: Ensures you receive medical care that aligns with your values, goals, and preferences. Provides guidance to your  family and loved ones, reducing the emotional burden of decision-making during critical moments.  Vision: Annual vision screenings are recommended for early detection of glaucoma, cataracts, and diabetic retinopathy. These exams can also reveal signs of chronic conditions such as diabetes and high blood pressure.  Dental: Annual dental screenings help detect early signs of oral cancer, gum disease, and other conditions linked to overall health, including heart disease and diabetes.  Please see the attached documents for additional preventive care recommendations.

## 2024-09-09 NOTE — Progress Notes (Unsigned)
 09/10/2024 [name] [id number] [birth year]  Referring provider: Katrinka Garnette KIDD, MD Primary GI doctor: Dr. San ( Dr. Aneita)  ASSESSMENT AND PLAN:  Left-sided ulcerative colitis 04/2021 colonoscopy and active left-sided colitis was on mercaptopurine  75 mg Well controlled at this time, no symptoms Will refill MP6 - Order colonoscopy to assess inflammation and polyps, discussed with the patient as he is due, he is healthy enough now at this time he would like to proceed with 1 more colonoscopy to assess his ulcerative colitis as well as evaluate for any polyps. We have discussed the risks of bleeding, infection, perforation, medication reactions, and remote risk of death associated with colonoscopy. All questions were answered and the patient acknowledges these risk and wishes to proceed. - Continue 6-MP 75 mg daily. - Refill 6-MP for 30-day supply (45 tablets). - Monitor kidney and liver function regularly.  GERD S/p cholecystectomy  2013 EGD irregular Z-line small hiatal hernia, negative HP/metaplasia 2023 low risk perfusion study Well controlled with the pepcid , will refill Call if any symptoms Weight loss discussed  Tubular adenomatous polyps 04/2021 colonoscopy and active left-sided colitis was on mercaptopurine  75 mg Recall 3 years With shared decision making we have decided to proceed with the colonoscopy since patient appears to be in good health at this time.  We have discussed the risks of bleeding, infection, perforation, medication reactions,  and remote risk of death associated with colonoscopy.  All questions were answered and the patient acknowledges these risk and wishes to proceed.  CAD On Plavix  2023 low risk perfusion study 2025 echocardiogram EF 70 to 75%, grade 1 diastolic dysfunction, no significant valve abnormalities Hold Plavix  for 5 days before procedure will instruct when and how to resume after procedure.  Patient understands that  there is a low but real risk of cardiovascular event such as heart attack, stroke, or embolism /  thrombosis, or ischemia while off Plavix .  The patient consents to proceed.  Will communicate by phone or EMR with patient's prescribing provider to confirm that holding Plavix  is reasonable in this case.   CKD stage III  Morbid obesity  Body mass index is 32.81 kg/m.  -Patient has been advised to make an attempt to improve diet and exercise patterns to aid in weight loss. -Recommended diet heavy in fruits and veggies and low in animal meats, cheeses, and dairy products, appropriate calorie intake  Patient Care Team: Katrinka Garnette KIDD, MD as PCP - General (Family Medicine) Okey Vina GAILS, MD as PCP - Cardiology (Cardiology) Pandora Cadet, [facility] as Pharmacist (Pharmacist) Nicholaus, Sherlean CROME, [facility] (Inactive) (Pharmacist)  HISTORY OF PRESENT ILLNESS: 78 y.o. male presents for evaluation of left-sided ulcerative colitis. Last seen in the office on 07/25/2023.   IBD history: Diagnosed 2005 after colonoscopy. April 2023 trial off 6-MP 75 mg daily and trial of sulfasalazine  with worsening symptoms.   6-MP resumed June 2023  Last colonoscopy: 04/28/2021 showed single medium mouth diverticulum ascending colon internal hemorrhoids otherwise normal Patient was on mercaptopurine  75 mg 1. Surgical [P], colon nos, random right colon - COLONIC MUCOSA WITH NO SPECIFIC HISTOPATHOLOGIC CHANGES - NEGATIVE FOR ACUTE INFLAMMATION, FEATURES OF CHRONICITY, GRANULOMAS OR DYSPLASIA 2. Surgical [P], colon nos, random left colon bx - INACTIVE CHRONIC COLITIS MANIFESTED ONLY BY MINIMAL ARCHITECTURAL CHANGES AND FOCAL PANETH CELL METAPLASIA- - NEGATIVE FOR ACUTE INFLAMMATION, GRANULOMAS OR DYSPLASIA  Last small bowel imaging:  never Extraintestinal manifestations: The patient has not had any extraintestinal symptoms Surgical history: no surgery.  Other significant medical history: GERD Elevated LFTs ABUS 2002  gallbladder polyp and sludge CAD on Plavix   Current History Discussed the use of AI scribe software for clinical note transcription with the patient, who gave verbal consent to proceed.  History of Present Illness   [name] is a 78 year old male with ulcerative colitis who presents for a follow-up visit.  He has a long-standing history of ulcerative colitis, diagnosed over 15 years ago, and is currently on 6-mercaptopurine  (6-MP) at a dose of 75 mg daily. He previously switched to a generic version of 6-MP due to cost, but disliked it and returned to his original regimen. Currently, he has no symptoms of diarrhea, abdominal pain, or blood in the stool. He recalls past experiences with prednisone tapers during flare-ups, which were effective in managing symptoms. He notes some joint pain, specifically in his shoulder, but no rashes.  He experiences heartburn and takes famotidine  (Pepcid ) for symptom control. He takes famotidine  (Pepcid ) for heartburn. He denies alcohol and smoking.  He has a history of polyps. Previous colonoscopy showed no polyps. He had a low-risk stress test in 2023 and a recent echocardiogram that showed no abnormalities. Recent lab work in August showed normal kidney function and blood counts, though his potassium was initially low but later improved.  He is also on Plavix  (clopidogrel ). He has a history of gallbladder removal, confirmed by a previous abdominal ultrasound.     Recent labs: 06/26/2024 WBC 6.3 HGB 14.9 MCV 86.9 Platelets 229.0 B12 435 FOLATE No results found for requested labs within last 1095 days. 06/26/2024 AST 24 ALT 28 Alkphos 53 TBili 0.7   IBD Health Care Maintenance: Immunization History  Administered Date(s) Administered   Fluad Quad(high Dose 65+) 08/31/2020, 09/13/2021, 08/31/2022, 09/24/2023   INFLUENZA, HIGH DOSE SEASONAL PF 09/13/2016, 09/01/2017, 08/22/2018, 08/21/2019, 09/24/2023   Influenza Split 08/19/2011, 08/17/2012    Influenza Whole 10/23/2007, 08/14/2009, 08/13/2010, 08/23/2019   Influenza,inj,Quad PF,6+ Mos 08/16/2013   Influenza-Unspecified 08/28/2014, 09/07/2015, 09/01/2017, 09/13/2018, 08/26/2022   Moderna Sars-Covid-2 Vaccination 12/27/2019, 01/24/2020, 10/04/2020, 06/25/2021   Pneumococcal Conjugate-13 06/08/2015   Pneumococcal Polysaccharide-23 04/25/2014   Td 11/14/2000, 09/21/2010   Tdap 11/11/2022   Zoster Recombinant(Shingrix) 09/24/2018, 11/11/2022, 04/05/2023    RELEVANT GI HISTORY, LABS, IMAGING: Myocardial Perfusion Imaging   The study is normal. The study is low risk.   No ST deviation was noted.   Left ventricular function is normal. Nuclear stress EF: 70 %. The left  ventricular ejection fraction is hyperdynamic (>65%). End diastolic cavity  size is normal.   Prior study available for comparison from 03/05/2008.   CBC    Component Value Date/Time   WBC 6.3 06/26/2024 1105   RBC 5.11 06/26/2024 1105   HGB 14.9 06/26/2024 1105   HCT 44.4 06/26/2024 1105   PLT 229.0 06/26/2024 1105   MCV 86.9 06/26/2024 1105   MCH 30.1 10/19/2020 1141   MCHC 33.5 06/26/2024 1105   RDW 16.1 (H) 06/26/2024 1105   LYMPHSABS 1.9 06/26/2024 1105   MONOABS 0.7 06/26/2024 1105   EOSABS 0.1 06/26/2024 1105   BASOSABS 0.1 06/26/2024 1105   Recent Labs    12/22/23 1035 06/26/24 1105  HGB 16.1 14.9    CMP     Component Value Date/Time   NA 137 08/07/2024 1521   NA 142 01/18/2018 1229   K 3.4 (L) 08/07/2024 1521   CL 97 08/07/2024 1521   CO2 30 08/07/2024 1521   GLUCOSE 86 08/07/2024 1521   GLUCOSE  101 (H) 11/13/2006 1000   BUN 13 08/07/2024 1521   BUN 12 01/18/2018 1229   CREATININE 1.32 08/07/2024 1521   CREATININE 1.22 (H) 10/19/2020 1141   CALCIUM  9.8 08/07/2024 1521   PROT 6.7 06/26/2024 1105   ALBUMIN 3.8 06/26/2024 1105   AST 24 06/26/2024 1105   ALT 28 06/26/2024 1105   ALKPHOS 53 06/26/2024 1105   BILITOT 0.7 06/26/2024 1105   GFRNONAA 58 (L) 10/19/2020 1141   GFRAA 68  10/19/2020 1141      Latest Ref Rng & Units 06/26/2024   11:05 AM 12/22/2023   10:35 AM 01/25/2023   10:40 AM  Hepatic Function  Total Protein 6.0 - 8.3 g/dL 6.7  7.1  6.3   Albumin 3.5 - 5.2 g/dL 3.8  4.0  3.7   AST 0 - 37 U/L 24  27  20    ALT 0 - 53 U/L 28  33  28   Alk Phosphatase 39 - 117 U/L 53  60  64   Total Bilirubin 0.2 - 1.2 mg/dL 0.7  0.6  0.9       Current Medications:    Current Outpatient Medications (Cardiovascular):    amLODipine  (NORVASC ) 2.5 MG tablet, TAKE 1 TABLET(2.5 MG) BY MOUTH DAILY   atorvastatin  (LIPITOR) 10 MG tablet, TAKE 1 TABLET EVERY DAY   irbesartan -hydrochlorothiazide  (AVALIDE) 150-12.5 MG tablet, TAKE 2 TABLETS EVERY DAY   nitroGLYCERIN  (NITROSTAT ) 0.4 MG SL tablet, PLACE 1 TAB UNDER TONGUE EVERY 5 MIN IF NEEDED FOR CHEST PAIN. MAY USE 3 TIMES.NO RELIEF CALL 911.  Current Outpatient Medications (Respiratory):    fluticasone  (FLONASE ) 50 MCG/ACT nasal spray, INHALE 1 SPRAY INTO EACH NOSTRIL ONCE DAILY.   guaiFENesin -codeine 100-10 MG/5ML syrup, Take 5 mLs by mouth every 6 (six) hours as needed for cough (do not drive for 8 hours after use).   Current Outpatient Medications (Hematological):    clopidogrel  (PLAVIX ) 75 MG tablet, TAKE 1 TABLET EVERY DAY  Current Outpatient Medications (Other):    acyclovir  ointment (ZOVIRAX ) 5 %, Apply topically every 3 (three) hours. For fever blisters up to 7 days   doxycycline  (VIBRA -TABS) 100 MG tablet, Take 1 tablet (100 mg total) by mouth 2 (two) times daily for 10 days.   fluticasone  (CUTIVATE ) 0.05 % cream, Apply topically 2 (two) times daily as needed.   metroNIDAZOLE (METROGEL) 0.75 % gel, Apply 1 Application topically 2 (two) times daily. From dermatology for rosacea   Na Sulfate-K Sulfate-Mg Sulfate concentrate (SUPREP) 17.5-3.13-1.6 GM/177ML SOLN, Take 1 kit (354 mLs total) by mouth once for 1 dose.   potassium chloride  SA (KLOR-CON  M) 20 MEQ tablet, Take 1 tablet (20 mEq total) by mouth 2 (two) times a  week.   tamsulosin (FLOMAX) 0.4 MG CAPS capsule, Take 0.4 mg by mouth daily.    famotidine  (PEPCID ) 40 MG tablet, Take 1 tablet (40 mg total) by mouth daily.   mercaptopurine  (PURINETHOL ) 50 MG tablet, Take 1.5 tablets (75 mg total) by mouth daily. Give on an empty stomach 1 hour before or 2 hours after meals.  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 Family History:  His family history includes CAD in his brother; COPD in his father; Heart attack in his brother; Heart disease in his sister; Stroke in his mother.  REVIEW OF SYSTEMS  : All other systems reviewed and negative except where noted in the History of Present Illness.  PHYSICAL EXAM: BP 122/74   Pulse 72   Ht 5' 8 (1.727 m)   Wt 215 lb 12.8 oz (97.9 kg)   BMI 32.81 kg/m  Physical Exam   GENERAL APPEARANCE: Well nourished, in no apparent distress. HEENT: No cervical lymphadenopathy, unremarkable thyroid , sclerae anicteric, conjunctiva pink. RESPIRATORY: Respiratory effort normal, breath sounds clear to auscultation bilaterally without rales, rhonchi, or wheezing. CARDIO: Regular rate and  rhythm with no murmurs, rubs, or gallops, peripheral pulses intact. ABDOMEN: Abdomen soft, non-tender, non-distended, active bowel sounds in all four quadrants, no rebound or mass appreciated. RECTAL: Declines. MUSCULOSKELETAL: Full range of motion, normal gait, shoulder examined with no abnormalities, without edema. SKIN: Dry, intact without rashes or lesions. No jaundice. NEURO: Alert, oriented, no focal deficits. PSYCH: Cooperative, normal mood and affect.       Alan JONELLE Coombs, PA-C 11:53 AM

## 2024-09-10 ENCOUNTER — Telehealth: Payer: Self-pay

## 2024-09-10 ENCOUNTER — Ambulatory Visit: Admitting: Physician Assistant

## 2024-09-10 ENCOUNTER — Other Ambulatory Visit (INDEPENDENT_AMBULATORY_CARE_PROVIDER_SITE_OTHER)

## 2024-09-10 ENCOUNTER — Ambulatory Visit: Payer: Self-pay | Admitting: Physician Assistant

## 2024-09-10 ENCOUNTER — Encounter: Payer: Self-pay | Admitting: Physician Assistant

## 2024-09-10 VITALS — BP 122/74 | HR 72 | Ht 68.0 in | Wt 215.8 lb

## 2024-09-10 DIAGNOSIS — K219 Gastro-esophageal reflux disease without esophagitis: Secondary | ICD-10-CM

## 2024-09-10 DIAGNOSIS — K5150 Left sided colitis without complications: Secondary | ICD-10-CM

## 2024-09-10 LAB — C-REACTIVE PROTEIN: CRP: <0.5 mg/dL (ref 0.5–20.0)

## 2024-09-10 LAB — SEDIMENTATION RATE: Sed Rate: 8 mm/h (ref 0–20)

## 2024-09-10 MED ORDER — MERCAPTOPURINE 50 MG PO TABS: 75.0000 mg | ORAL_TABLET | Freq: Every day | ORAL | 11 refills | Status: AC

## 2024-09-10 MED ORDER — FAMOTIDINE 40 MG PO TABS: 40.0000 mg | ORAL_TABLET | Freq: Every day | ORAL | 3 refills | Status: AC

## 2024-09-10 MED ORDER — NA SULFATE-K SULFATE-MG SULF 17.5-3.13-1.6 GM/177ML PO SOLN: 1.0000 | Freq: Once | ORAL | 0 refills | Status: AC

## 2024-09-10 NOTE — Telephone Encounter (Signed)
 Yes thanks- can hold for 5 days Plavix  but also needs to schedule follow up with cardiology- please encourage him when you speak with him- has baen about 18 months- we filled the Plavix  for him short term but with plan for him to see cardiology

## 2024-09-10 NOTE — Telephone Encounter (Signed)
  [name] 30-Jun-1946 [id number]  09-10-24   Dear Dr Katrinka,   We have scheduled the above named patient for a colonoscopy procedure. Our records show that he is on anticoagulation therapy.  Please advise as to whether the patient may come off their therapy of Plavix  5 days prior to their procedure which is scheduled for 09-26-24.  Please route your response to [facility] or fax response to [phone].  Sincerely,    [employer], CMA(AAMA)

## 2024-09-10 NOTE — Patient Instructions (Addendum)
 _______________________________________________________  If your blood pressure at your visit was 140/90 or greater, please contact your primary care physician to follow up on this.  _______________________________________________________  If you are age 77 or older, your body mass index should be between 23-30. Your Body mass index is 32.81 kg/m. If this is out of the aforementioned range listed, please consider follow up with your Primary Care Provider.  If you are age 9 or younger, your body mass index should be between 19-25. Your Body mass index is 32.81 kg/m. If this is out of the aformentioned range listed, please consider follow up with your Primary Care Provider.   ________________________________________________________  The [facility] providers would like to encourage you to use MYCHART to communicate with providers for non-urgent requests or questions.  Due to long hold times on the telephone, sending your provider a message by [facility] may be a faster and more efficient way to get a response.  Please allow 48 business hours for a response.  Please remember that this is for non-urgent requests.  _______________________________________________________  [facility] is using a team-based approach to care.  Your team is made up of your doctor and two to three APPS. Our APPS (Nurse Practitioners and Physician Assistants) work with your physician to ensure care continuity for you. They are fully qualified to address your health concerns and develop a treatment plan. They communicate directly with your gastroenterologist to care for you. Seeing the Advanced Practice Practitioners on your physician's team can help you by facilitating care more promptly, often allowing for earlier appointments, access to diagnostic testing, procedures, and other specialty referrals.   Your provider has requested that you go to the basement level for lab work before leaving today. Press B on the  elevator. The lab is located at the first door on the left as you exit the elevator.  You will be contacted by our office prior to your procedure for directions on holding your Plavix .  If you do not hear from our office 1 week prior to your scheduled procedure, please call [phone] to discuss.   We have sent the following medications to your pharmacy for you to pick up at your convenience: Suprep  You have been scheduled for a colonoscopy. Please follow written instructions given to you at your visit today.   If you use inhalers (even only as needed), please bring them with you on the day of your procedure.  DO NOT TAKE 7 DAYS PRIOR TO TEST- Trulicity (dulaglutide) Ozempic, Wegovy (semaglutide) Mounjaro (tirzepatide) Bydureon Bcise (exanatide extended release)  DO NOT TAKE 1 DAY PRIOR TO YOUR TEST Rybelsus (semaglutide) Adlyxin (lixisenatide) Victoza (liraglutide) Byetta (exanatide) ___________________________________________________________________________    Take the pepcid   Avoid spicy and acidic foods Avoid fatty foods Limit your intake of coffee, tea, alcohol, and carbonated drinks Work to maintain a healthy weight Keep the head of the bed elevated at least 3 inches with blocks or a wedge pillow if you are having any nighttime symptoms Stay upright for 2 hours after eating Avoid meals and snacks three to four hours before bedtime

## 2024-09-11 NOTE — Telephone Encounter (Signed)
 Patient made aware to stop plavix  5 days prior to procedure so 11-8 (Saturday) he will stop it. He has an appointment with cardiology on 11-10 add he is aware to keep that appointment and let them know he has a procedure on the 13th and has  temporary stopped the plavix . He voiced understanding

## 2024-09-15 NOTE — Progress Notes (Signed)
 Cardiology Office Note   Date:  09/23/2024   ID:  [name], [name] 1946/08/16, MRN [id number]  PCP:  Katrinka Garnette KIDD, MD  Cardiologist:   Vina Gull, MD    F/U of CAD     History of Present Illness: [name] is a 78 y.o. male with a history of CAD (MI in 42) and HTN  1993  Underwent PTCA to LAD   2011  Myoview  normal  2016  Echo  LVEF normal    March 2023  Stress myoview   Normal  LVEF normal  NO ischemia    I saw the pt in April 2024   SInce seen he denies CP  NO SOB   Says he just feels tired  No energy    Doesn't want to do anything  Does have gym membership but doesn't go HE denies dizziness   BP is good  Seen by PCP this fall   Had some swelling in legs   Echo done    LVEF and RVEF normal  Gr I DD  Diet  Eats a lot of bread     Current Meds  Medication Sig   acyclovir  ointment (ZOVIRAX ) 5 % Apply topically every 3 (three) hours. For fever blisters up to 7 days (Patient taking differently: Apply topically as needed. For fever blisters up to 7 days)   amLODipine  (NORVASC ) 2.5 MG tablet TAKE 1 TABLET(2.5 MG) BY MOUTH DAILY   atorvastatin  (LIPITOR) 10 MG tablet TAKE 1 TABLET EVERY DAY   clopidogrel  (PLAVIX ) 75 MG tablet TAKE 1 TABLET EVERY DAY   famotidine  (PEPCID ) 40 MG tablet Take 1 tablet (40 mg total) by mouth daily.   fluticasone  (FLONASE ) 50 MCG/ACT nasal spray INHALE 1 SPRAY INTO EACH NOSTRIL ONCE DAILY.   irbesartan -hydrochlorothiazide  (AVALIDE) 150-12.5 MG tablet TAKE 2 TABLETS EVERY DAY   mercaptopurine  (PURINETHOL ) 50 MG tablet Take 1.5 tablets (75 mg total) by mouth daily. Give on an empty stomach 1 hour before or 2 hours after meals.   Na Sulfate-K Sulfate-Mg Sulfate concentrate (SUPREP) 17.5-3.13-1.6 GM/177ML SOLN once.   nitroGLYCERIN  (NITROSTAT ) 0.4 MG SL tablet PLACE 1 TAB UNDER TONGUE EVERY 5 MIN IF NEEDED FOR CHEST PAIN. MAY USE 3 TIMES.NO RELIEF CALL 911.   potassium chloride  SA (KLOR-CON  M) 20 MEQ tablet Take 1 tablet (20 mEq  total) by mouth 2 (two) times a week.   tamsulosin (FLOMAX) 0.4 MG CAPS capsule Take 0.4 mg by mouth daily.      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 His smoking use included cigarettes. He started smoking about 60 years ago. He has a 3 pack-year smoking history. He has quit using smokeless tobacco.  His smokeless tobacco use included chew. He reports that he does not drink alcohol and does not use drugs.   Family History:  The patient's family history includes CAD in his brother; COPD in his father; Heart attack in his brother; Heart disease in his sister; Stroke in his mother.    ROS:  Please see the history of present illness. All other systems are reviewed and  Negative to the above problem except as noted.    PHYSICAL EXAM: VS:  BP 110/66 (BP Location: Left Arm, Patient Position: Sitting, Cuff Size: Large)   Pulse 66   Ht 5' 8 (1.727 m)   Wt 214 lb (97.1 kg)   SpO2 98%   BMI 32.54 kg/m   HZW:[place] 78 yo , in no acute distress  HEENT: normal  Neck: no JVD orcarotid bruit   Cardiac: RRR; no murmur  Respiratory:  clear to auscultation  GI: soft, nontender  No  hepatomegaly  Ext  Tr LE edema   EKG:  EKG is ordered today. SR 66 bpm   Echo  Sept 2025  1. Left ventricular ejection fraction, by estimation, is 70 to 75%. The  left ventricle has hyperdynamic function. The left ventricle has no  regional wall motion abnormalities. Left ventricular diastolic parameters  are consistent with Grade I diastolic  dysfunction (impaired relaxation).   2. Right ventricular systolic function is normal. The right ventricular  size is normal. Tricuspid regurgitation signal is inadequate for assessing  PA pressure.   3. The mitral valve is normal in structure. Trivial mitral valve  regurgitation. No evidence of mitral stenosis.   4. The aortic valve is tricuspid. There is mild calcification of the  aortic valve. Aortic valve regurgitation is not visualized. No aortic  stenosis is present.   5. The inferior vena cava is dilated in size with >50% respiratory  variability, suggesting right atrial pressure of 8 mmHg.   Lipid Panel    Component Value Date/Time   CHOL 118 12/22/2023 1035   TRIG 153.0 (H) 12/22/2023 1035   TRIG 88 10/20/2006 0927   HDL 37.20 (L) 12/22/2023 1035   CHOLHDL 3 12/22/2023 1035   VLDL 30.6 12/22/2023 1035   LDLCALC 50 12/22/2023 1035   LDLCALC 66 10/19/2020 1141      Wt Readings from Last 3 Encounters:  09/23/24 214 lb (97.1 kg)  09/10/24 215 lb 12.8 oz (97.9 kg)  09/03/24 212 lb (96.2 kg)      ASSESSMENT AND PLAN:  1  CAD MI 1993  PTCA to LAD  Pt denies CP    Normal myoview  in March 2023  Follow      2  HL  LIpids are excellent   LDL 50  HDL 37      3  HTN  BP is great  With edema would cut out amlodipine    Follow BP   4  Metabolics  A1C 6.3 Fall 2025  Cut out carbs    TSH is normal Fatigue--   Encouraged him to start walking     Will check BMET   K 3.4 and Cr 1.3 on recent check   Stay hydrated     Follows with S Hunter   Follow up  in cardiology in 1 year    Current medicines are reviewed at length with the patient today.  The patient does not have concerns regarding medicines.  Signed, Vina Gull, MD  09/23/2024 9:42 AM    [facility] [address] [place], [place], [place]  [address] Phone: [phone]; Fax: [phone]

## 2024-09-18 DIAGNOSIS — X32XXXD Exposure to sunlight, subsequent encounter: Secondary | ICD-10-CM | POA: Diagnosis not present

## 2024-09-18 DIAGNOSIS — D225 Melanocytic nevi of trunk: Secondary | ICD-10-CM | POA: Diagnosis not present

## 2024-09-18 DIAGNOSIS — L570 Actinic keratosis: Secondary | ICD-10-CM | POA: Diagnosis not present

## 2024-09-18 NOTE — Progress Notes (Signed)
 Agree with the assessment and plan as outlined by Quentin Mulling, PA-C. ? ?Keron Neenan, DO, FACG ? ?

## 2024-09-19 ENCOUNTER — Encounter: Payer: Self-pay | Admitting: Gastroenterology

## 2024-09-23 ENCOUNTER — Ambulatory Visit: Attending: Internal Medicine | Admitting: Internal Medicine

## 2024-09-23 ENCOUNTER — Encounter: Payer: Self-pay | Admitting: Internal Medicine

## 2024-09-23 VITALS — BP 110/66 | HR 66 | Ht 68.0 in | Wt 214.0 lb

## 2024-09-23 DIAGNOSIS — I10 Essential (primary) hypertension: Secondary | ICD-10-CM | POA: Diagnosis not present

## 2024-09-23 DIAGNOSIS — I2510 Atherosclerotic heart disease of native coronary artery without angina pectoris: Secondary | ICD-10-CM | POA: Diagnosis not present

## 2024-09-23 NOTE — Patient Instructions (Signed)
 Medication Instructions:  Your physician has recommended you make the following change in your medication:  1) STOP amlodipine   **Check your blood pressures are home and send a list of your readings to Dr. Okey via MyChart in 3-4 weeks.  *If you need a refill on your cardiac medications before your next appointment, please call your pharmacy*  Follow-Up: At [facility], you and your health needs are our priority.  As part of our continuing mission to provide you with exceptional heart care, our providers are all part of one team.  This team includes your primary Cardiologist (physician) and Advanced Practice Providers or APPs (Physician Assistants and Nurse Practitioners) who all work together to provide you with the care you need, when you need it.  Your next appointment:   1 year(s)  Provider:   Vina Okey, MD   We recommend signing up for the patient portal called MyChart.  Sign up information is provided on this After Visit Summary.  MyChart is used to connect with patients for Virtual Visits (Telemedicine).  Patients are able to view lab/test results, encounter notes, upcoming appointments, etc.  Non-urgent messages can be sent to your provider as well.    To learn more about what you can do with MyChart, go to [email or web address].

## 2024-09-24 LAB — BASIC METABOLIC PANEL WITH GFR
BUN/Creatinine Ratio: 9 — ABNORMAL LOW (ref 10–24)
BUN: 12 mg/dL (ref 8–27)
CO2: 28 mmol/L (ref 20–29)
Calcium: 10 mg/dL (ref 8.6–10.2)
Chloride: 97 mmol/L (ref 96–106)
Creatinine, Ser: 1.35 mg/dL — ABNORMAL HIGH (ref 0.76–1.27)
Glucose: 115 mg/dL — ABNORMAL HIGH (ref 70–99)
Potassium: 3.8 mmol/L (ref 3.5–5.2)
Sodium: 138 mmol/L (ref 134–144)
eGFR: 54 mL/min/1.73 — ABNORMAL LOW (ref >59)

## 2024-09-25 ENCOUNTER — Ambulatory Visit: Payer: Self-pay | Admitting: Internal Medicine

## 2024-09-26 ENCOUNTER — Ambulatory Visit (AMBULATORY_SURGERY_CENTER): Admitting: Gastroenterology

## 2024-09-26 ENCOUNTER — Encounter: Payer: Self-pay | Admitting: Gastroenterology

## 2024-09-26 VITALS — BP 119/74 | HR 64 | Temp 97.5°F | Resp 13 | Ht 68.0 in | Wt 215.1 lb

## 2024-09-26 DIAGNOSIS — Z860100 Personal history of colon polyps, unspecified: Secondary | ICD-10-CM

## 2024-09-26 DIAGNOSIS — K648 Other hemorrhoids: Secondary | ICD-10-CM | POA: Diagnosis not present

## 2024-09-26 DIAGNOSIS — K640 First degree hemorrhoids: Secondary | ICD-10-CM | POA: Diagnosis not present

## 2024-09-26 DIAGNOSIS — G4730 Sleep apnea, unspecified: Secondary | ICD-10-CM | POA: Diagnosis not present

## 2024-09-26 DIAGNOSIS — Z1211 Encounter for screening for malignant neoplasm of colon: Secondary | ICD-10-CM | POA: Diagnosis not present

## 2024-09-26 DIAGNOSIS — K5150 Left sided colitis without complications: Secondary | ICD-10-CM

## 2024-09-26 DIAGNOSIS — I10 Essential (primary) hypertension: Secondary | ICD-10-CM | POA: Diagnosis not present

## 2024-09-26 DIAGNOSIS — D124 Benign neoplasm of descending colon: Secondary | ICD-10-CM

## 2024-09-26 DIAGNOSIS — Z860101 Personal history of adenomatous and serrated colon polyps: Secondary | ICD-10-CM | POA: Diagnosis not present

## 2024-09-26 DIAGNOSIS — D120 Benign neoplasm of cecum: Secondary | ICD-10-CM | POA: Diagnosis not present

## 2024-09-26 DIAGNOSIS — K635 Polyp of colon: Secondary | ICD-10-CM | POA: Diagnosis not present

## 2024-09-26 DIAGNOSIS — D122 Benign neoplasm of ascending colon: Secondary | ICD-10-CM

## 2024-09-26 DIAGNOSIS — I252 Old myocardial infarction: Secondary | ICD-10-CM | POA: Diagnosis not present

## 2024-09-26 MED ORDER — SODIUM CHLORIDE 0.9 % IV SOLN: 500.0000 mL | Freq: Once | INTRAVENOUS | Status: DC

## 2024-09-26 NOTE — Progress Notes (Signed)
 Called to room to assist during endoscopic procedure.  Patient ID and intended procedure confirmed with present staff. Received instructions for my participation in the procedure from the performing physician.

## 2024-09-26 NOTE — Progress Notes (Signed)
 GASTROENTEROLOGY PROCEDURE H&P NOTE   Primary Care Physician: Katrinka Garnette KIDD, MD    Reason for Procedure:  Ulcerative Colitis surveillance, colon polyp surveillance  Plan:    Colonoscopy  Patient is appropriate for endoscopic procedure(s) in the ambulatory ([facility]) setting.  The nature of the procedure, as well as the risks, benefits, and alternatives were carefully and thoroughly reviewed with the patient. Ample time for discussion and questions allowed. The patient understood, was satisfied, and agreed to proceed. I personally addressed all patient questions and concerns.     HPI: [name] is a 78 y.o. male who presents for colonoscopy for ongoing UC surveillance along with colon polyp surveillance.    Patient was most recently seen in the Gastroenterology Clinic on 09/10/2024.  No interval change in medical history since that appointment. Please refer to that note for full details regarding GI history and clinical presentation.  Currently in clinical remission with 6-MP 75 mg daily.  Holding Plavix  for procedure today.  Last colonoscopy was 04/2021 and notable for single diverticulum in the ascending colon, internal hemorrhoids, and otherwise normal with normal biopsies from the right colon and biopsy of the left colon showing inactive, chronic colitis with minimal architectural changes, all consistent with quiescent disease.  Currently in clinical remission on 6-mercaptopurine  monotherapy.  Recent normal ESR and CRP.  Additionally, history of adenomatous polyps on a prior colonoscopy in 02/2018.  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 Authorizing Provider  acyclovir  ointment (ZOVIRAX ) 5 % Apply topically every 3 (three) hours. For fever blisters up to 7 days Patient taking differently: Apply topically as needed. For fever blisters up to 7 days 12/22/23   Katrinka Garnette KIDD, MD  atorvastatin  (LIPITOR) 10 MG tablet TAKE 1 TABLET EVERY DAY 11/30/23   Katrinka Garnette KIDD, MD  clopidogrel  (PLAVIX ) 75 MG tablet TAKE 1 TABLET EVERY DAY 11/30/23   Katrinka Garnette KIDD, MD  famotidine  (PEPCID ) 40 MG tablet Take 1 tablet (40 mg total) by mouth daily. 09/10/24   Craig Alan SAUNDERS, PA-C  fluticasone  (FLONASE ) 50 MCG/ACT nasal spray INHALE 1 SPRAY INTO EACH NOSTRIL ONCE DAILY. 09/27/23   Lucius Krabbe, NP  irbesartan -hydrochlorothiazide  (AVALIDE) 150-12.5 MG tablet TAKE 2 TABLETS EVERY DAY 08/05/24   Katrinka Garnette KIDD, MD  mercaptopurine  (PURINETHOL ) 50 MG tablet Take 1.5 tablets (75 mg total) by mouth daily. Give on an empty stomach 1 hour before or 2 hours after meals. 09/10/24 09/05/25  Craig Alan SAUNDERS, PA-C  Na Sulfate-K Sulfate-Mg Sulfate concentrate (SUPREP) 17.5-3.13-1.6 GM/177ML SOLN once. 09/10/24   [provider]  nitroGLYCERIN  (NITROSTAT ) 0.4 MG SL tablet PLACE 1 TAB UNDER TONGUE EVERY 5 MIN IF NEEDED FOR CHEST PAIN. MAY USE 3 TIMES.NO RELIEF CALL 911. 09/21/22   Katrinka Garnette KIDD, MD  potassium chloride  SA (KLOR-CON  M) 20 MEQ tablet Take 1 tablet (20 mEq total) by mouth 2 (two) times a week. 08/08/24   Lucius Krabbe, NP  tamsulosin (FLOMAX) 0.4 MG CAPS capsule Take 0.4 mg by mouth daily.  01/17/18   [provider]    Current Outpatient Medications  Medication Sig Dispense Refill   acyclovir  ointment (ZOVIRAX ) 5 % Apply topically every 3 (three) hours. For fever blisters up to 7 days (Patient taking differently: Apply topically as needed. For fever blisters up to 7 days) 5 g 2   atorvastatin  (LIPITOR) 10 MG tablet TAKE 1 TABLET EVERY DAY 90 tablet 3   clopidogrel  (PLAVIX ) 75 MG tablet TAKE 1 TABLET EVERY DAY 90 tablet 3   famotidine  (PEPCID ) 40 MG tablet Take 1 tablet (40 mg total) by mouth daily. 90 tablet 3   fluticasone  (FLONASE ) 50 MCG/ACT nasal spray INHALE 1 SPRAY INTO EACH NOSTRIL ONCE DAILY. 16 g 3   irbesartan -hydrochlorothiazide  (AVALIDE) 150-12.5 MG tablet TAKE 2 TABLETS EVERY DAY 180 tablet 3   mercaptopurine  (PURINETHOL ) 50 MG tablet Take 1.5 tablets (75 mg total) by mouth daily. Give on an empty stomach 1 hour before or 2 hours after meals. 45 tablet 11   Na Sulfate-K Sulfate-Mg Sulfate concentrate (SUPREP) 17.5-3.13-1.6 GM/177ML SOLN once.     nitroGLYCERIN  (NITROSTAT ) 0.4 MG SL tablet PLACE 1 TAB UNDER TONGUE EVERY 5 MIN IF NEEDED FOR CHEST PAIN. MAY USE 3 TIMES.NO RELIEF CALL 911. 25 tablet 0   potassium chloride  SA (KLOR-CON  M) 20 MEQ tablet Take 1 tablet (20 mEq total) by mouth 2 (two) times a week. 30 tablet 3   tamsulosin (FLOMAX) 0.4 MG CAPS capsule Take 0.4 mg by mouth daily.      No current facility-administered medications for this visit.    Allergies as of 09/26/2024 - Review Complete 09/23/2024  Allergen Reaction Noted   Penicillins Rash 12/21/2006   Sulfasalazine  Rash 04/28/2022    Family History  Problem Relation Age of Onset   Stroke Mother    COPD Father    Heart disease Sister        valve replacement   CAD Brother    Heart attack Brother    Colon cancer Neg Hx    Esophageal cancer Neg Hx    Liver disease Neg Hx    Diabetes Neg Hx    Rectal  cancer Neg Hx    Stomach cancer Neg Hx     Social History   Socioeconomic History   Marital status: Widowed    Spouse name: Not on file   Number of children: 3   Years of education: Not on file   Highest education level: Not on file  Occupational History   Occupation: apartment maintenance    Employer: [employer]    Comment: retired  Tobacco Use   Smoking status: Former    Current packs/day: 0.00    Average packs/day: 1 pack/day for 3.0 years (3.0 ttl pk-yrs)    Types: Cigarettes    Start date: 11/15/1963    Quit date: 11/14/1966    Years since quitting: 57.9   Smokeless tobacco: Former    Types: Chew   Tobacco  comments:    quit 50 years ago  Vaping Use   Vaping status: Never Used  Substance and Sexual Activity   Alcohol use: No    Alcohol/week: 0.0 standard drinks of alcohol   Drug use: No   Sexual activity: Not on file  Other Topics Concern   Not on file  Social History Narrative   Widower January 2019.. 3 children- son is a patient here. 4 grandkids. 1 greatgrandchildi in [place] in 2025      Retired from haematologist      Hobbies: work on an old truck- expensive to work on so a little at a time, mow the kids yards, enjoys yardwork      Pt does not get regular exercise--daily caffeine use-5 cups daily   Social Drivers of Corporate Investment Banker Strain: Low Risk  (09/03/2024)   Overall Financial Resource Strain (CARDIA)    Difficulty of Paying Living Expenses: Not hard at all  Food Insecurity: No Food Insecurity (09/03/2024)   Hunger Vital Sign    Worried About Running Out of Food in the Last Year: Never true    Ran Out of Food in the Last Year: Never true  Transportation Needs: No Transportation Needs (09/03/2024)   PRAPARE - Administrator, Civil Service (Medical): No    Lack of Transportation (Non-Medical): No  Physical Activity: Inactive (09/03/2024)   Exercise Vital Sign    Days of Exercise per Week: 0  days    Minutes of Exercise per Session: 0 min  Stress: No Stress Concern Present (09/03/2024)   [employer] of Occupational Health - Occupational Stress Questionnaire    Feeling of Stress: Not at all  Social Connections: Moderately Isolated (09/03/2024)   Social Connection and Isolation Panel    Frequency of Communication with Friends and Family: More than three times a week    Frequency of Social Gatherings with Friends and Family: Three times a week    Attends Religious Services: More than 4 times per year    Active Member of Clubs or Organizations: No    Attends Banker Meetings: Never    Marital Status: Widowed  Intimate Partner Violence: Not At Risk (09/03/2024)   Humiliation, Afraid, Rape, and Kick questionnaire    Fear of Current or Ex-Partner: No    Emotionally Abused: No    Physically Abused: No    Sexually Abused: No    Physical Exam: Vital signs in last 24 hours: @There  were no vitals taken for this visit. GEN: NAD EYE: Sclerae anicteric ENT: MMM CV: Non-tachycardic Pulm: CTA b/l GI: Soft, NT/ND NEURO:  Alert & Oriented x 3   [name] Flatter, DO [facility]   09/26/2024 12:22 PM

## 2024-09-26 NOTE — Patient Instructions (Signed)
 Educational handout provided to patient related to Hemorrhoids and Polyps  Resume previous diet  Continue present medications- RESUME PLAVIX  AT PRIOR DOSE TOMORROW  Awaiting pathology results   YOU HAD AN ENDOSCOPIC PROCEDURE TODAY AT THE [facility]:   Refer to the procedure report that was given to you for any specific questions about what was found during the examination.  If the procedure report does not answer your questions, please call your gastroenterologist to clarify.  If you requested that your care partner not be given the details of your procedure findings, then the procedure report has been included in a sealed envelope for you to review at your convenience later.  YOU SHOULD EXPECT: Some feelings of bloating in the abdomen. Passage of more gas than usual.  Walking can help get rid of the air that was put into your GI tract during the procedure and reduce the bloating. If you had a lower endoscopy (such as a colonoscopy or flexible sigmoidoscopy) you may notice spotting of blood in your stool or on the toilet paper. If you underwent a bowel prep for your procedure, you may not have a normal bowel movement for a few days.  Please Note:  You might notice some irritation and congestion in your nose or some drainage.  This is from the oxygen used during your procedure.  There is no need for concern and it should clear up in a day or so.  SYMPTOMS TO REPORT IMMEDIATELY:  Following lower endoscopy (colonoscopy or flexible sigmoidoscopy):  Excessive amounts of blood in the stool  Significant tenderness or worsening of abdominal pains  Swelling of the abdomen that is new, acute  Fever of 100F or higher  For urgent or emergent issues, a gastroenterologist can be reached at any hour by calling [phone]. Do not use MyChart messaging for urgent concerns.    DIET:  We do recommend a small meal at first, but then you may proceed to your regular diet.  Drink plenty of  fluids but you should avoid alcoholic beverages for 24 hours.  ACTIVITY:  You should plan to take it easy for the rest of today and you should NOT DRIVE or use heavy machinery until tomorrow (because of the sedation medicines used during the test).    FOLLOW UP: Our staff will call the number listed on your records the next business day following your procedure.  We will call around 7:15- 8:00 am to check on you and address any questions or concerns that you may have regarding the information given to you following your procedure. If we do not reach you, we will leave a message.     If any biopsies were taken you will be contacted by phone or by letter within the next 1-3 weeks.  Please call us  at [phone] if you have not heard about the biopsies in 3 weeks.    SIGNATURES/CONFIDENTIALITY: You and/or your care partner have signed paperwork which will be entered into your electronic medical record.  These signatures attest to the fact that that the information above on your After Visit Summary has been reviewed and is understood.  Full responsibility of the confidentiality of this discharge information lies with you and/or your care-partner.

## 2024-09-26 NOTE — Progress Notes (Signed)
To PACU, VSS. Report to [email or web address] 

## 2024-09-26 NOTE — Progress Notes (Signed)
Updated medical record with pt

## 2024-09-26 NOTE — Op Note (Signed)
 [facility] Patient Name: [name] Procedure Date: 09/26/2024 12:37 PM MRN: [id number] Endoscopist: Sandor Flatter , MD, [id number] Age: 78 Referring MD:  Date of Birth: 02-07-1946 Gender: Male Account #: [id number] Procedure:                Colonoscopy Indications:              High risk colon cancer surveillance: Personal                            history of colonic polyps, High risk colon cancer                            surveillance: Ulcerative colitis of 8 (or more)                            years duration                           Last colonoscopy was 04/2021 and notable for single                            diverticulum in the ascending colon, internal                            hemorrhoids, and otherwise normal with normal                            biopsies from the right colon and biopsy of the                            left colon showing inactive, chronic colitis with                            minimal architectural changes, all consistent with                            quiescent disease. Currently in clinical remission                            on 6-mercaptopurine  monotherapy. Recent normal ESR                            and CRP.                           Additionally, history of adenomatous polyps on a                            prior colonoscopy in 02/2018. Medicines:                Monitored Anesthesia Care Procedure:                Pre-Anesthesia Assessment:                           - Prior to the procedure, a  History and Physical                            was performed, and patient medications and                            allergies were reviewed. The patient's tolerance of                            previous anesthesia was also reviewed. The risks                            and benefits of the procedure and the sedation                            options and risks were discussed with the patient.                            All questions were  answered, and informed consent                            was obtained. Prior Anticoagulants: The patient has                            taken Plavix  (clopidogrel ), last dose was 5 days                            prior to procedure. ASA Grade Assessment: II - A                            patient with mild systemic disease. After reviewing                            the risks and benefits, the patient was deemed in                            satisfactory condition to undergo the procedure.                           After obtaining informed consent, the colonoscope                            was passed under direct vision. Throughout the                            procedure, the patient's blood pressure, pulse, and                            oxygen saturations were monitored continuously. The                            CF [id number] #[id number] was introduced through the anus  and advanced to the the cecum, identified by                            appendiceal orifice and ileocecal valve. The                            colonoscopy was performed without difficulty. The                            patient tolerated the procedure well. The quality                            of the bowel preparation was good. The ileocecal                            valve, appendiceal orifice, and rectum were                            photographed. Scope In: 1:19:59 PM Scope Out: 1:46:18 PM Scope Withdrawal Time: 0 hours 23 minutes 51 seconds  Total Procedure Duration: 0 hours 26 minutes 19 seconds  Findings:                 The perianal and digital rectal examinations were                            normal.                           Seven sessile polyps were found in the ascending                            colon and cecum. The polyps were 2 to 6 mm in size.                            These polyps were removed with a cold snare.                            Resection and retrieval were  complete. Estimated                            blood loss was minimal.                           Two sessile polyps were found in the descending                            colon. The polyps were 2 to 4 mm in size. These                            polyps were removed with a cold snare. Resection                            and retrieval were complete. Estimated blood loss  was minimal.                           Normal mucosa was found in the entire colon.                            Biopsies were taken throughout the colon with a                            cold forceps for histology. Estimated blood loss                            was minimal.                           Non-bleeding internal hemorrhoids were found during                            retroflexion. The hemorrhoids were small. Complications:            No immediate complications. Estimated Blood Loss:     Estimated blood loss was minimal. Impression:               - Seven 2 to 6 mm polyps in the ascending colon and                            in the cecum, removed with a cold snare. Resected                            and retrieved.                           - Two 2 to 4 mm polyps in the descending colon,                            removed with a cold snare. Resected and retrieved.                           - Normal mucosa in the entire examined colon.                            Biopsied.                           - Non-bleeding internal hemorrhoids. Recommendation:           - Patient has a contact number available for                            emergencies. The signs and symptoms of potential                            delayed complications were discussed with the                            patient. Return to normal activities tomorrow.  Written discharge instructions were provided to the                            patient.                           - Resume previous diet.                            - Continue present medications.                           - Await pathology results.                           - Repeat colonoscopy for surveillance based on                            pathology results.                           - Resume Plavix  (clopidogrel ) at prior dose                            tomorrow. Sandor Flatter, MD 09/26/2024 1:53:22 PM

## 2024-09-27 ENCOUNTER — Telehealth: Payer: Self-pay

## 2024-09-27 NOTE — Telephone Encounter (Signed)
  Follow up Call-     09/26/2024   12:54 PM  Call back number  Post procedure Call Back phone  # [phone]  Permission to leave phone message Yes     Patient questions:  Do you have a fever, pain , or abdominal swelling? No. Pain Score  0 *  Have you tolerated food without any problems? Yes.    Have you been able to return to your normal activities? Yes.    Do you have any questions about your discharge instructions: Diet   No. Medications  No. Follow up visit  No.  Do you have questions or concerns about your Care? No.  Actions: * If pain score is 4 or above: No action needed, pain <4.

## 2024-10-01 LAB — SURGICAL PATHOLOGY

## 2024-10-04 ENCOUNTER — Ambulatory Visit: Payer: Self-pay | Admitting: Gastroenterology

## 2024-10-07 DIAGNOSIS — M25511 Pain in right shoulder: Secondary | ICD-10-CM | POA: Diagnosis not present

## 2024-11-08 ENCOUNTER — Other Ambulatory Visit: Payer: Self-pay | Admitting: Family Medicine

## 2025-01-09 ENCOUNTER — Encounter: Admitting: Family Medicine

## 2025-09-15 ENCOUNTER — Ambulatory Visit
